# Patient Record
Sex: Male | Born: 1950
Health system: Southern US, Community
[De-identification: ages and names within clinical notes are randomized; demographics above are authoritative.]

## PROBLEM LIST (undated history)

## (undated) DIAGNOSIS — R0600 Dyspnea, unspecified: Secondary | ICD-10-CM

## (undated) DIAGNOSIS — I509 Heart failure, unspecified: Secondary | ICD-10-CM

## (undated) DIAGNOSIS — J449 Chronic obstructive pulmonary disease, unspecified: Secondary | ICD-10-CM

## (undated) DIAGNOSIS — E119 Type 2 diabetes mellitus without complications: Secondary | ICD-10-CM

## (undated) DIAGNOSIS — I10 Essential (primary) hypertension: Secondary | ICD-10-CM

## (undated) HISTORY — PX: NO PAST SURGERIES: SHX2092

---

## 2000-11-18 ENCOUNTER — Encounter: Payer: Self-pay | Admitting: Emergency Medicine

## 2000-11-18 ENCOUNTER — Emergency Department (HOSPITAL_COMMUNITY): Admission: EM | Admit: 2000-11-18 | Discharge: 2000-11-18 | Payer: Self-pay | Admitting: Emergency Medicine

## 2001-08-21 DIAGNOSIS — J449 Chronic obstructive pulmonary disease, unspecified: Secondary | ICD-10-CM | POA: Diagnosis present

## 2001-08-21 DIAGNOSIS — J441 Chronic obstructive pulmonary disease with (acute) exacerbation: Secondary | ICD-10-CM | POA: Diagnosis present

## 2001-08-24 ENCOUNTER — Encounter: Payer: Self-pay | Admitting: Emergency Medicine

## 2001-08-25 ENCOUNTER — Inpatient Hospital Stay (HOSPITAL_COMMUNITY): Admission: EM | Admit: 2001-08-25 | Discharge: 2001-09-01 | Payer: Self-pay | Admitting: Emergency Medicine

## 2001-10-27 DIAGNOSIS — B1710 Acute hepatitis C without hepatic coma: Secondary | ICD-10-CM | POA: Insufficient documentation

## 2007-10-16 ENCOUNTER — Emergency Department (HOSPITAL_COMMUNITY): Admission: EM | Admit: 2007-10-16 | Discharge: 2007-10-17 | Payer: Self-pay | Admitting: Emergency Medicine

## 2009-07-15 ENCOUNTER — Encounter: Admission: RE | Admit: 2009-07-15 | Discharge: 2009-07-15 | Payer: Self-pay | Admitting: Psychiatry

## 2009-07-16 ENCOUNTER — Inpatient Hospital Stay (HOSPITAL_COMMUNITY): Admission: EM | Admit: 2009-07-16 | Discharge: 2009-07-21 | Payer: Self-pay | Admitting: Emergency Medicine

## 2009-07-16 ENCOUNTER — Ambulatory Visit: Payer: Self-pay | Admitting: Cardiology

## 2009-07-16 ENCOUNTER — Encounter (INDEPENDENT_AMBULATORY_CARE_PROVIDER_SITE_OTHER): Payer: Self-pay | Admitting: Emergency Medicine

## 2009-07-23 ENCOUNTER — Emergency Department (HOSPITAL_COMMUNITY): Admission: EM | Admit: 2009-07-23 | Discharge: 2009-07-23 | Payer: Self-pay | Admitting: Emergency Medicine

## 2009-08-26 ENCOUNTER — Encounter: Admission: RE | Admit: 2009-08-26 | Discharge: 2009-08-26 | Payer: Self-pay | Admitting: Family Medicine

## 2009-11-20 ENCOUNTER — Ambulatory Visit: Payer: Self-pay | Admitting: Gastroenterology

## 2010-09-06 LAB — BLOOD GAS, ARTERIAL
Acid-Base Excess: 15.4 mmol/L — ABNORMAL HIGH (ref 0.0–2.0)
Bicarbonate: 40.6 mEq/L — ABNORMAL HIGH (ref 20.0–24.0)
Drawn by: 312971
O2 Content: 2 L/min
O2 Saturation: 86.3 %
Patient temperature: 97.6
TCO2: 42.5 mmol/L (ref 0–100)
pCO2 arterial: 60.4 mmHg (ref 35.0–45.0)
pH, Arterial: 7.44 (ref 7.350–7.450)
pO2, Arterial: 51.4 mmHg — ABNORMAL LOW (ref 80.0–100.0)

## 2010-09-06 LAB — POCT I-STAT 3, ART BLOOD GAS (G3+)
Acid-Base Excess: 8 mmol/L — ABNORMAL HIGH (ref 0.0–2.0)
Bicarbonate: 35.5 mEq/L — ABNORMAL HIGH (ref 20.0–24.0)
O2 Saturation: 85 %
Patient temperature: 98.6
TCO2: 37 mmol/L (ref 0–100)
pCO2 arterial: 62.9 mmHg (ref 35.0–45.0)
pH, Arterial: 7.36 (ref 7.350–7.450)
pO2, Arterial: 54 mmHg — ABNORMAL LOW (ref 80.0–100.0)

## 2010-09-06 LAB — DIFFERENTIAL
Basophils Absolute: 0 10*3/uL (ref 0.0–0.1)
Basophils Relative: 1 % (ref 0–1)
Eosinophils Absolute: 0.3 10*3/uL (ref 0.0–0.7)
Eosinophils Relative: 5 % (ref 0–5)
Lymphocytes Relative: 19 % (ref 12–46)
Lymphs Abs: 1.2 10*3/uL (ref 0.7–4.0)
Monocytes Absolute: 0.8 10*3/uL (ref 0.1–1.0)
Monocytes Relative: 13 % — ABNORMAL HIGH (ref 3–12)
Neutro Abs: 3.9 10*3/uL (ref 1.7–7.7)
Neutrophils Relative %: 63 % (ref 43–77)

## 2010-09-06 LAB — CBC
HCT: 38.3 % — ABNORMAL LOW (ref 39.0–52.0)
Hemoglobin: 12.9 g/dL — ABNORMAL LOW (ref 13.0–17.0)
MCHC: 33.7 g/dL (ref 30.0–36.0)
MCV: 114 fL — ABNORMAL HIGH (ref 78.0–100.0)
Platelets: 174 10*3/uL (ref 150–400)
RBC: 3.36 MIL/uL — ABNORMAL LOW (ref 4.22–5.81)
RDW: 14.1 % (ref 11.5–15.5)
WBC: 6.2 10*3/uL (ref 4.0–10.5)

## 2010-09-06 LAB — BASIC METABOLIC PANEL
BUN: 10 mg/dL (ref 6–23)
BUN: 10 mg/dL (ref 6–23)
BUN: 6 mg/dL (ref 6–23)
BUN: 9 mg/dL (ref 6–23)
CO2: 35 mEq/L — ABNORMAL HIGH (ref 19–32)
CO2: 38 mEq/L — ABNORMAL HIGH (ref 19–32)
CO2: 40 mEq/L — ABNORMAL HIGH (ref 19–32)
CO2: 42 mEq/L (ref 19–32)
Calcium: 8.8 mg/dL (ref 8.4–10.5)
Calcium: 9.1 mg/dL (ref 8.4–10.5)
Calcium: 9.2 mg/dL (ref 8.4–10.5)
Calcium: 9.3 mg/dL (ref 8.4–10.5)
Chloride: 104 mEq/L (ref 96–112)
Chloride: 89 mEq/L — ABNORMAL LOW (ref 96–112)
Chloride: 90 mEq/L — ABNORMAL LOW (ref 96–112)
Chloride: 96 mEq/L (ref 96–112)
Creatinine, Ser: 0.88 mg/dL (ref 0.4–1.5)
Creatinine, Ser: 0.89 mg/dL (ref 0.4–1.5)
Creatinine, Ser: 0.91 mg/dL (ref 0.4–1.5)
Creatinine, Ser: 0.95 mg/dL (ref 0.4–1.5)
GFR calc Af Amer: >60 mL/min (ref >60)
GFR calc Af Amer: >60 mL/min (ref >60)
GFR calc Af Amer: >60 mL/min (ref >60)
GFR calc Af Amer: >60 mL/min (ref >60)
GFR calc non Af Amer: >60 mL/min (ref >60)
GFR calc non Af Amer: >60 mL/min (ref >60)
GFR calc non Af Amer: >60 mL/min (ref >60)
GFR calc non Af Amer: >60 mL/min (ref >60)
Glucose, Bld: 115 mg/dL — ABNORMAL HIGH (ref 70–99)
Glucose, Bld: 150 mg/dL — ABNORMAL HIGH (ref 70–99)
Glucose, Bld: 180 mg/dL — ABNORMAL HIGH (ref 70–99)
Glucose, Bld: 98 mg/dL (ref 70–99)
Potassium: 3.3 mEq/L — ABNORMAL LOW (ref 3.5–5.1)
Potassium: 3.4 mEq/L — ABNORMAL LOW (ref 3.5–5.1)
Potassium: 3.8 mEq/L (ref 3.5–5.1)
Potassium: 4 mEq/L (ref 3.5–5.1)
Sodium: 136 mEq/L (ref 135–145)
Sodium: 139 mEq/L (ref 135–145)
Sodium: 142 mEq/L (ref 135–145)
Sodium: 143 mEq/L (ref 135–145)

## 2010-09-06 LAB — BRAIN NATRIURETIC PEPTIDE
Pro B Natriuretic peptide (BNP): 184 pg/mL — ABNORMAL HIGH (ref 0.0–100.0)
Pro B Natriuretic peptide (BNP): 400 pg/mL — ABNORMAL HIGH (ref 0.0–100.0)
Pro B Natriuretic peptide (BNP): 527 pg/mL — ABNORMAL HIGH (ref 0.0–100.0)

## 2010-09-06 LAB — POCT CARDIAC MARKERS
CKMB, poc: 1.1 ng/mL (ref 1.0–8.0)
Myoglobin, poc: 69.2 ng/mL (ref 12–200)
Troponin i, poc: <0.05 ng/mL (ref 0.00–0.09)

## 2010-09-06 LAB — CARDIAC PANEL(CRET KIN+CKTOT+MB+TROPI)
CK, MB: 1.7 ng/mL (ref 0.3–4.0)
CK, MB: 2 ng/mL (ref 0.3–4.0)
Relative Index: 1 (ref 0.0–2.5)
Relative Index: 1.1 (ref 0.0–2.5)
Total CK: 164 U/L (ref 7–232)
Total CK: 175 U/L (ref 7–232)
Troponin I: 0.03 ng/mL (ref 0.00–0.06)
Troponin I: 0.03 ng/mL (ref 0.00–0.06)

## 2010-09-06 LAB — FOLATE RBC: RBC Folate: 444 ng/mL (ref 180–600)

## 2010-09-06 LAB — MAGNESIUM: Magnesium: 1.5 mg/dL (ref 1.5–2.5)

## 2010-09-06 LAB — HEPATIC FUNCTION PANEL
ALT: 20 U/L (ref 0–53)
AST: 24 U/L (ref 0–37)
Albumin: 3.2 g/dL — ABNORMAL LOW (ref 3.5–5.2)
Alkaline Phosphatase: 64 U/L (ref 39–117)
Bilirubin, Direct: 0.2 mg/dL (ref 0.0–0.3)
Indirect Bilirubin: 0.3 mg/dL (ref 0.3–0.9)
Total Bilirubin: 0.5 mg/dL (ref 0.3–1.2)
Total Protein: 6.9 g/dL (ref 6.0–8.3)

## 2010-09-06 LAB — TSH: TSH: 0.527 u[IU]/mL (ref 0.350–4.500)

## 2010-09-06 LAB — VITAMIN B12: Vitamin B-12: 922 pg/mL — ABNORMAL HIGH (ref 211–911)

## 2010-11-06 NOTE — Discharge Summary (Signed)
[place]. [facility]  Patient:    Mitchell King, Mitchell King Visit Number: [id number] MRN: [id number]          Service Type: MED Location: [phone] Attending Physician:  Katy Apo. Dictated by:   Renford Dills, M.D. Admit Date:  08/24/2001 Discharge Date: 09/01/2001                             Discharge Summary  DATE OF BIRTH:  13-Jul-1950  DISCHARGE DIAGNOSES: 1. Alcohol withdrawal; condition at discharge stable status post detox with    Librium, p.r.n. Ativan. 2. Mild chronic obstructive pulmonary disease exacerbation status post    treatment with short course of oral antibiotics and p.o. steroids.  No    oxygen dependence at the time of discharge. 3. Hypoxia secondary to #2. 4. Chest pain relieved by nitroglycerin; normal coronary arteries per cardiac    catheterization.  The patients two-dimensional echocardiogram showed    ejection fraction of 45-50%, stress showed questionable reversible defect    in the apex however, as stated, normal coronary arteries via cardiac    catheterization.  No further cardiac evaluation needed. 5. Newly diagnosed diabetes; hemoglobin A1C of 7.3.  Discharged on p.o.    Amaryl.  The patient instructed on lifestyle modifications. 6. Tobacco abuse.  DISCHARGE MEDICATIONS: 1. Altace 5 mg one daily. 2. Amaryl 1 mg one half tab daily.  DISCHARGE INSTRUCTIONS:  The patient is asked to check his blood sugars two times a day and keep a log.  The patient is asked to purchase over-the-counter meds (multivitamin, thiamine, and folate), asked to take one aspirin daily 81 mg.  DISPOSITION:  The patient is being discharged to home, will follow up at [facility] where the patient plans to enter alcohol detoxication.  The patient plans to find a primary MD there while in detox.  STUDIES:  As stated, the patient had a cardiac catheterization which revealed normal coronary arteries.  Two-dimensional echo - EF 45-50%, mild  decreased LV function, wall motion cannot be evaluated.  The patient had a Cardiolite stress test which showed reversible defect in the apex.  The patient had a hemoglobin A1C elevated at 7.23, a TSH normal at 0.40.  The patient had a spiral CAT scan of the chest negative for PE but it did show some small pleural densities with recommended treatment followup.  HISTORY OF PRESENT ILLNESS:  The patient is a 60 year old black male history of the above medical problems brought to the ED by family because of complaints of agitation and being abuser.  Further eval revealed that the patient had been intoxicated and was suffering from alcohol withdrawal. Further questioning also revealed that the patient had complaints of shortness of breath and cough (nonproductive).  The patient was found to be hypoxic on room air at 80%.  Admission was deemed necessary for further evaluation and treatment of alcohol intoxication and apparent COPD exacerbation.  Please see dictated H&P for further details of history of present illness.  HOSPITAL COURSE: #1 - ALCOHOL WITHDRAWAL:  The patient was admitted to medicine floor bed, telemetry, was treated for alcohol withdrawal with multivitamin, thiamine, folate, Librium and Ativan as needed.  There were no complications from the patients treatment from the standpoint of alcohol withdrawal.  The patient recovered without any complications.  #2 - CHRONIC OBSTRUCTIVE PULMONARY DISEASE EXACERBATION:  The patient is a known smoker and as stated had complaints of cough (  nonproductive in nature) and shortness of breath and found to be hypoxic on admission.  The patients chest x-ray did show an infiltrate; however, the patient started with general treatment of O2, nebs, steroids, and antibiotics for COPD exacerbation.  The patients O2 requirement improved with the above treatment and there were no other complications.  The patient did not require O2 at the time of  discharge. The patient states that he is now ready to stop smoking and it has been recommended to try nicotine patch.  He also will be provided with an inhaler of Combivent that he has to take every 4-6 hours as needed for cough and shortness of breath.  #3 - CHEST PAIN RELIEVED BY NITROGLYCERIN:  On August 26, 2001, the patient had complaints of shortness of breath and chest pain which had been relieved by nitroglycerin.  Because of the above the patient underwent cardiac evaluation studies as described above included 2-D echo, Cardiolite stress test, and ultimately a cardiac catheterization which revealed normal coronary arteries. The patient did not have any other episodes of chest discomfort during this hospitalization and no further cardiac evaluation is deemed necessary.  #4 - NEWLY DIAGNOSED DIABETES:  The patient had fasting CBGs that were elevated during this hospitalization.  Further testing revealed a CBG of 7.3. The patient is started on Amaryl inhospital, had some high fluctuations in his CBGs but this is partially to be secondary to the patients prednisone taper because of the COPD.  The patient is going to be started on a low dose of Amaryl 1 mg one half tablet daily and asked to have lifestyle modifications while exercising four times a week and no concentrated sweets and start on an 1800 ADA diet.  There were no other complications during this hospitalization. As stated, the patient is advised to refrain from alcohol use and tobacco use. The patient presently plans to seek alcohol rehab at [facility]. Dictated by:   Renford Dills, M.D. Attending Physician:  Renford Dills D. DD:  09/01/01 TD:  09/02/01 Job: [id number]

## 2010-11-06 NOTE — Cardiovascular Report (Signed)
[place]. [name]  Patient:    Mitchell King, Mitchell King Visit Number: [id number] MRN: [id number]          Service Type: MED Location: [phone] Attending Physician:  Katy Apo. Dictated by:   Darci Needle, M.D. Proc. Date: 08/31/01 Admit Date:  08/24/2001 Discharge Date: 09/01/2001   CC:         Renford Dills, M.D.  Anastasio Auerbach, M.D.  Armanda Magic, M.D.   Cardiac Catheterization  INDICATIONS:  Abnormal Cardiolite suggesting inferior infarction/ischemia.  PROCEDURE PERFORMED: 1. Left heart catheterization. 2. Selective coronary angiography. 3. Left ventriculography.  DESCRIPTION OF PROCEDURE:  After informed consent, a #6 French sheath was placed in the right femoral artery using the modified Seldinger technique.  A #6 French A2 multipurpose catheter was used for hemodynamic recordings, left ventriculography, and right coronary angiography.  The patient tolerated the procedure without complications.  RESULTS:   I. Hemodynamic data      A. Aortic pressure 148/90.      B. Left ventricular pressure 148/18.  II. Left ventriculography:  The ventricle is dilated.  The contractile      pattern is mild diffuse hypokinesis.  The EF is in the 40-50% range.      No mitral regurgitation is noted. III. Selective coronary angiography      A. Left main coronary:  Widely patent.      B. Left anterior descending coronary:  The left anterior descending         coronary artery is large, wraps around the left ventricular apex, and         gives origin to 2 diagonal branches.  The most significant disease is         noted in the LAD.      C. Circumflex artery:  The circumflex is large.  It gives origin to 2         obtuse marginals.  It is entirely normal.      D. Right coronary:  Normal.  This is a large vessel that gives origin to         a PDA and 2 left ventricular branches.  No significant obstruction is         noted.  The PDA and 2 left ventricular  branches arise distally.  CONCLUSIONS: 1. 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 2. Essentially normal epicardial coronary arteries without any evidence of    atherosclerosis angiographically.  RECOMMENDATIONS:  Absence from alcohol.  Aggressive blood pressure control including beta blocker and ACE inhibitor therapy as components of the antihypertensive regimen to help prevent progressive left ventricular dysfunction over time. Dictated by:   Darci Needle, M.D. Attending Physician:  Renford Dills D. DD:  08/31/01 TD:  09/01/01 Job: [id number]

## 2011-03-16 LAB — POCT I-STAT, CHEM 8
BUN: 14
Calcium, Ion: 1.11 — ABNORMAL LOW
Chloride: 105
Creatinine, Ser: 1.1
Glucose, Bld: 99
HCT: 51
Hemoglobin: 17.3 — ABNORMAL HIGH
Potassium: 4.1
Sodium: 142
TCO2: 26

## 2011-03-16 LAB — POCT CARDIAC MARKERS
CKMB, poc: 1.8
Myoglobin, poc: 148
Operator id: 277751
Troponin i, poc: <0.05

## 2011-11-20 ENCOUNTER — Emergency Department (HOSPITAL_COMMUNITY)
Admission: EM | Admit: 2011-11-20 | Discharge: 2011-11-20 | Disposition: A | Discharge status: Home / Self Care | Payer: BC Managed Care – PPO | Source: Home / Self Care | Attending: Emergency Medicine | Admitting: Emergency Medicine

## 2011-11-20 ENCOUNTER — Encounter (HOSPITAL_COMMUNITY): Payer: Self-pay | Admitting: *Deleted

## 2011-11-20 DIAGNOSIS — K5289 Other specified noninfective gastroenteritis and colitis: Secondary | ICD-10-CM

## 2011-11-20 DIAGNOSIS — K529 Noninfective gastroenteritis and colitis, unspecified: Secondary | ICD-10-CM

## 2011-11-20 HISTORY — DX: Chronic obstructive pulmonary disease, unspecified: J44.9

## 2011-11-20 HISTORY — DX: Essential (primary) hypertension: I10

## 2011-11-20 MED ORDER — GI COCKTAIL ~~LOC~~
30.0000 mL | Freq: Once | ORAL | Status: AC
  Administered 2011-11-20: 30 mL via ORAL

## 2011-11-20 MED ORDER — FLUTICASONE-SALMETEROL 250-50 MCG/DOSE IN AEPB: 1.0000 | INHALATION_SPRAY | Freq: Two times a day (BID) | RESPIRATORY_TRACT | Status: DC

## 2011-11-20 MED ORDER — DIPHENOXYLATE-ATROPINE 2.5-0.025 MG PO TABS: 1.0000 | ORAL_TABLET | Freq: Four times a day (QID) | ORAL | Status: AC | PRN

## 2011-11-20 MED ORDER — ONDANSETRON 4 MG PO TBDP
ORAL_TABLET | ORAL | Status: AC
  Filled 2011-11-20: qty 2

## 2011-11-20 MED ORDER — ONDANSETRON 8 MG PO TBDP: 8.0000 mg | ORAL_TABLET | Freq: Three times a day (TID) | ORAL | Status: AC | PRN

## 2011-11-20 MED ORDER — ONDANSETRON 4 MG PO TBDP
8.0000 mg | ORAL_TABLET | Freq: Once | ORAL | Status: AC
  Administered 2011-11-20: 8 mg via ORAL

## 2011-11-20 MED ORDER — GI COCKTAIL ~~LOC~~
ORAL | Status: AC
  Filled 2011-11-20: qty 30

## 2011-11-20 NOTE — ED Notes (Signed)
Pt with onset of n/v/d abdominal pain early Thursday am - upper abdominal pain worse with coughing - vomited x 2 today - diarrhea x 1 today - per pt drank some water this am vomited - yesterday vomiting and diarrhea when attempted to drink water - feeling weak and dizzy this am

## 2011-11-20 NOTE — ED Notes (Signed)
Nausea improved with zofran odt - gi cocktail given

## 2011-11-20 NOTE — ED Provider Notes (Signed)
Chief Complaint  Patient presents with  . Nausea  . Emesis  . Diarrhea  . Abdominal Pain  . Cough    History of Present Illness:   The patient is a 61 year old male who has had a three-day history of nausea, vomiting, and diarrhea he's had pain in his upper abdomen. He denies any fever, chills, but has had some sweats. There's been no blood in the vomitus or stool and no bilious vomitus. He's had no suspicious ingestions or exposures.  Review of Systems:  Other than noted above, the patient denies any of the following symptoms: Systemic:  No fevers, chills, sweats, weight loss or gain, fatigue, or tiredness. ENT:  No nasal congestion, rhinorrhea, or sore throat. Lungs:  No cough, wheezing, or shortness of breath. Cardiac:  No chest pain, syncope, or presyncope. GI:  No abdominal pain, nausea, vomiting, anorexia, diarrhea, constipation, blood in stool or vomitus. GU:  No dysuria, frequency, or urgency.  PMFSH:  Past medical history, family history, social history, meds, and allergies were reviewed.  Physical Exam:   Vital signs:  BP 136/81  Pulse 115  Temp(Src) 98.9 F (37.2 C) (Oral)  Resp 21  SpO2 93% General:  Alert and oriented.  In no distress.  Skin warm and dry.  Good skin turgor, brisk capillary refill. ENT:  No scleral icterus, moist mucous membranes, no oral lesions, pharynx clear. Lungs:  Breath sounds clear and equal bilaterally.  No wheezes, rales, or rhonchi. Heart:  Rhythm regular, without extrasystoles.  No gallops or murmers. Abdomen:  Abdomen was soft, flat, nondistended. He had mild generalized pain to palpation without guarding or rebound. This was not localized with the exception of some localized tenderness in the epigastrium. There was no guarding or rebound. No organomegaly or mass. Bowel sounds are hyperactive. Skin: Clear, warm, and dry.  Good turgor.  Brisk capillary refill.   Course in Urgent Care Center:   He was given a dose of GI cocktail, 30 mL, and  Zofran 8 mg sublingually. He tolerated these well without any side effects.  Assessment:  The encounter diagnosis was Gastroenteritis.  Plan:   1.  The following meds were prescribed:   New Prescriptions   DIPHENOXYLATE-ATROPINE (LOMOTIL) 2.5-0.025 MG PER TABLET    Take 1 tablet by mouth 4 (four) times daily as needed for diarrhea or loose stools.   FLUTICASONE-SALMETEROL (ADVAIR DISKUS) 250-50 MCG/DOSE AEPB    Inhale 1 puff into the lungs 2 (two) times daily.   ONDANSETRON (ZOFRAN ODT) 8 MG DISINTEGRATING TABLET    Take 1 tablet (8 mg total) by mouth every 8 (eight) hours as needed for nausea.   2.  The patient was instructed in symptomatic care and handouts were given. 3.  The patient was told to return if becoming worse in any way, if no better in 2 or 3 days, and given some red flag symptoms that would indicate earlier return. 4.  The patient was told to take only sips of clear liquids for the next 24 hours and then advance to a b.r.a.t. Diet.      Reuben Likes, MD 11/20/11 2232

## 2011-11-20 NOTE — Discharge Instructions (Signed)

## 2013-10-11 ENCOUNTER — Encounter (HOSPITAL_COMMUNITY): Payer: Self-pay | Admitting: Emergency Medicine

## 2013-10-11 ENCOUNTER — Emergency Department (INDEPENDENT_AMBULATORY_CARE_PROVIDER_SITE_OTHER): Payer: BC Managed Care – PPO

## 2013-10-11 ENCOUNTER — Emergency Department (HOSPITAL_COMMUNITY)
Admission: EM | Admit: 2013-10-11 | Discharge: 2013-10-11 | Disposition: A | Discharge status: Home / Self Care | Payer: BC Managed Care – PPO | Source: Home / Self Care | Attending: Family Medicine | Admitting: Family Medicine

## 2013-10-11 DIAGNOSIS — J441 Chronic obstructive pulmonary disease with (acute) exacerbation: Secondary | ICD-10-CM

## 2013-10-11 LAB — POCT I-STAT, CHEM 8
BUN: 17 mg/dL (ref 6–23)
Calcium, Ion: 1.08 mmol/L — ABNORMAL LOW (ref 1.13–1.30)
Chloride: 99 mEq/L (ref 96–112)
Creatinine, Ser: 1 mg/dL (ref 0.50–1.35)
Glucose, Bld: 106 mg/dL — ABNORMAL HIGH (ref 70–99)
HCT: 50 % (ref 39.0–52.0)
Hemoglobin: 17 g/dL (ref 13.0–17.0)
Potassium: 3.8 mEq/L (ref 3.7–5.3)
Sodium: 137 mEq/L (ref 137–147)
TCO2: 27 mmol/L (ref 0–100)

## 2013-10-11 LAB — CBC
HCT: 45.1 % (ref 39.0–52.0)
Hemoglobin: 15.2 g/dL (ref 13.0–17.0)
MCH: 31.3 pg (ref 26.0–34.0)
MCHC: 33.7 g/dL (ref 30.0–36.0)
MCV: 93 fL (ref 78.0–100.0)
Platelets: 256 10*3/uL (ref 150–400)
RBC: 4.85 MIL/uL (ref 4.22–5.81)
RDW: 13.3 % (ref 11.5–15.5)
WBC: 5.5 10*3/uL (ref 4.0–10.5)

## 2013-10-11 MED ORDER — ALBUTEROL SULFATE HFA 108 (90 BASE) MCG/ACT IN AERS: 2.0000 | INHALATION_SPRAY | Freq: Four times a day (QID) | RESPIRATORY_TRACT | Status: DC | PRN

## 2013-10-11 MED ORDER — PREDNISONE 10 MG PO KIT: PACK | ORAL | Status: DC

## 2013-10-11 MED ORDER — IPRATROPIUM-ALBUTEROL 0.5-2.5 (3) MG/3ML IN SOLN
RESPIRATORY_TRACT | Status: AC
Start: 2013-10-11 — End: 2013-10-11
  Filled 2013-10-11: qty 3

## 2013-10-11 MED ORDER — AZITHROMYCIN 250 MG PO TABS: 250.0000 mg | ORAL_TABLET | Freq: Every day | ORAL | Status: DC

## 2013-10-11 MED ORDER — IPRATROPIUM-ALBUTEROL 0.5-2.5 (3) MG/3ML IN SOLN
3.0000 mL | Freq: Once | RESPIRATORY_TRACT | Status: AC
  Administered 2013-10-11: 3 mL via RESPIRATORY_TRACT

## 2013-10-11 NOTE — Discharge Instructions (Signed)
Thank you for coming in today. Take prednisone daily as directed.  Take azithromycin antibiotics.  Use albuterol as needed Please quit smoking. You will die if you do not stop smoking.  Follow up with your primary doctor soon.  Call or go to the emergency room if you get worse, have trouble breathing, have chest pains, or palpitations.   Chronic Obstructive Pulmonary Disease Exacerbation Chronic obstructive pulmonary disease (COPD) is a common lung condition in which airflow from the lungs is limited. COPD is a general term that can be used to describe many different lung problems that limit airflow, including chronic bronchitis and emphysema. COPD exacerbations are episodes when breathing symptoms become much worse and require extra treatment. Without treatment, COPD exacerbations can be life threatening, and frequent COPD exacerbations can cause further damage to your lungs. CAUSES   Respiratory infections.   Exposure to smoke.   Exposure to air pollution, chemical fumes, or dust. Sometimes there is no apparent cause or trigger. RISK FACTORS  Smoking cigarettes.  Older age.  Frequent prior COPD exacerbations. SIGNS AND SYMPTOMS   Increased coughing.   Increased thick spit (sputum) production.   Increased wheezing.   Increased shortness of breath.   Rapid breathing.   Chest tightness. DIAGNOSIS  Your medical history, a physical exam, and tests will help your health care provider make a diagnosis. Tests may include:  A chest X-ray.  Basic lab tests.  Sputum testing.  An arterial blood gas test. TREATMENT  Depending on the severity of your COPD exacerbation, you may need to be admitted to a hospital for treatment. Some of the treatments commonly used to treat COPD exacerbations are:   Antibiotic medicines.   Bronchodilators. These are drugs that expand the air passages. They may be given with an inhaler or nebulizer. Spacer devices may be needed to help  improve drug delivery.  Corticosteroid medicines.  Supplemental oxygen therapy.  HOME CARE INSTRUCTIONS   Do not smoke. Quitting smoking is very important to prevent COPD from getting worse and exacerbations from happening as often.  Avoid exposure to all substances that irritate the airway, especially to tobacco smoke.   If prescribed, take your antibiotics as directed. Finish them even if you start to feel better.  Only take over-the-counter or prescription medicines as directed by your health care provider.It is important to use correct technique with inhaled medicines.  Drink enough fluids to keep your urine clear or pale yellow (unless you have a medical condition that requires fluid restriction).  Use a cool mist vaporizer. This makes it easier to clear your chest when you cough.   If you have a home nebulizer and oxygen, continue to use them as directed.   Maintain all necessary vaccinations to prevent infections.   Exercise regularly.   Eat a healthy diet.   Keep all follow-up appointments as directed by your health care provider. SEEK IMMEDIATE MEDICAL CARE IF:  You have worsening shortness of breath.   You have trouble talking.   You have severe chest pain.  You have blood in your sputum.  You have a fever.  You have weakness, vomit repeatedly, or faint.   You feel confused.   You continue to get worse. MAKE SURE YOU:   Understand these instructions.  Will watch your condition.  Will get help right away if you are not doing well or get worse. Document Released: 04/04/2007 Document Revised: 03/28/2013 Document Reviewed: 02/09/2013 [facility] SurgeryExit[facility] Patient Information 2014 [place], [place].

## 2013-10-11 NOTE — ED Notes (Signed)
Pt c/o SOB onset Monday Reports nurse/medical provider at job Rx Ventolin and BP med 02 stat today is 90% Sx also include: productive cough, HA, dizziness, vomiting, decreased appetite Hx of COPD; smokes 2 PPD Denies f/d Alert w/no signs of acute distress.

## 2013-10-11 NOTE — ED Provider Notes (Signed)
Mitchell King is a 63 y.o. male who presents to Urgent Care today for shortness of breath. Patient has shortness of breath productive cough congestion and wheezing. Symptoms have been present for the past 5 days or so. He was seen by a nurse practitioner at work early this week and was prescribed albuterol. This helps temporarily. For shortness of breath is worse with exertion. He denies any significant chest pains or palpitations. He has a history of COPD but does not take any chronic medications. He is currently smoking about 2 packs a day.   Past Medical History  Diagnosis Date  . Hypertension   . COPD (chronic obstructive pulmonary disease)    History  Substance Use Topics  . Smoking status: Current Every Day Smoker  . Smokeless tobacco: Not on file  . Alcohol Use: No     Comment: non x 5 years    ROS as above Medications: No current facility-administered medications for this encounter.   Current Outpatient Prescriptions  Medication Sig Dispense Refill  . albuterol (PROVENTIL HFA;VENTOLIN HFA) 108 (90 BASE) MCG/ACT inhaler Inhale 2 puffs into the lungs every 6 (six) hours as needed for wheezing or shortness of breath.  1 Inhaler  2  . azithromycin (ZITHROMAX) 250 MG tablet Take 1 tablet (250 mg total) by mouth daily. Take first 2 tablets together, then 1 every day until finished.  6 tablet  0  . PredniSONE 10 MG KIT 12 day dose pack po  1 kit  0  . [DISCONTINUED] Fluticasone-Salmeterol (ADVAIR DISKUS) 250-50 MCG/DOSE AEPB Inhale 1 puff into the lungs 2 (two) times daily.  60 each  0    Exam:  BP 115/82  Pulse 95  Temp(Src) 98.3 F (36.8 C) (Oral)  Resp 27  SpO2 90% Oxygen saturation ranges from 90-93% on room air at rest Gen: Well NAD HEENT: EOMI,  MMM Lungs: Mildly increased worker breathing with some tachypnea present. Wheezing and expiratory phase is present bilaterally. Heart: RRR no MRG Abd: NABS, Soft. NT, ND Exts: Brisk capillary refill, warm and well perfused.    Patient was given a DuoNeb nebulizer treatment, and felt much better. His lung exam improved.  Results for orders placed during the hospital encounter of 10/11/13 (from the past 24 hour(s))  CBC     Status: None   Collection Time    10/11/13  3:41 PM      Result Value Ref Range   WBC 5.5  4.0 - 10.5 K/uL   RBC 4.85  4.22 - 5.81 MIL/uL   Hemoglobin 15.2  13.0 - 17.0 g/dL   HCT 45.1  39.0 - 52.0 %   MCV 93.0  78.0 - 100.0 fL   MCH 31.3  26.0 - 34.0 pg   MCHC 33.7  30.0 - 36.0 g/dL   RDW 13.3  11.5 - 15.5 %   Platelets 256  150 - 400 K/uL  POCT I-STAT, CHEM 8     Status: Abnormal   Collection Time    10/11/13  4:04 PM      Result Value Ref Range   Sodium 137  137 - 147 mEq/L   Potassium 3.8  3.7 - 5.3 mEq/L   Chloride 99  96 - 112 mEq/L   BUN 17  6 - 23 mg/dL   Creatinine, Ser 1.00  0.50 - 1.35 mg/dL   Glucose, Bld 106 (*) 70 - 99 mg/dL   Calcium, Ion 1.08 (*) 1.13 - 1.30 mmol/L   TCO2 27  0 -  100 mmol/L   Hemoglobin 17.0  13.0 - 17.0 g/dL   HCT 50.0  39.0 - 52.0 %   Dg Chest 2 View  10/11/2013   CLINICAL DATA:  Chest pain.  EXAM: CHEST  2 VIEW  COMPARISON:  DG CHEST 2 VIEW dated 07/23/2009  FINDINGS: Mediastinum and hilar structures normal. The lungs are clear. Basilar pleural parenchymal thickening again noted consistent with scarring. Heart size normal. No acute bony abnormality. Degenerative changes thoracic spine.  IMPRESSION: Pleural parenchymal scarring.  No acute abnormality.   Electronically Signed   By: Marcello Moores  Register   On: 10/11/2013 16:18    Assessment and Plan: 63 y.o. male with COPD exacerbation. Plan to treat with prednisone, albuterol, and antibiotics. Encourage patient to quit smoking. Followup with primary care provider soon.  Discussed warning signs or symptoms. Please see discharge instructions. Patient expresses understanding.    Gregor Hams, MD 10/11/13 [phone]

## 2017-04-20 ENCOUNTER — Encounter (INDEPENDENT_AMBULATORY_CARE_PROVIDER_SITE_OTHER): Payer: Self-pay | Admitting: Physician Assistant

## 2017-04-20 ENCOUNTER — Encounter (HOSPITAL_COMMUNITY): Payer: Self-pay | Admitting: *Deleted

## 2017-04-20 ENCOUNTER — Ambulatory Visit (INDEPENDENT_AMBULATORY_CARE_PROVIDER_SITE_OTHER): Payer: Medicare Other | Admitting: Physician Assistant

## 2017-04-20 ENCOUNTER — Emergency Department (HOSPITAL_COMMUNITY)
Admission: EM | Admit: 2017-04-20 | Discharge: 2017-04-20 | Disposition: A | Discharge status: Home / Self Care | Payer: Medicare Other | Attending: Emergency Medicine | Admitting: Emergency Medicine

## 2017-04-20 ENCOUNTER — Emergency Department (HOSPITAL_COMMUNITY): Payer: Medicare Other

## 2017-04-20 VITALS — BP 159/80 | HR 83 | Temp 97.9°F | Wt 171.6 lb

## 2017-04-20 DIAGNOSIS — R42 Dizziness and giddiness: Secondary | ICD-10-CM | POA: Insufficient documentation

## 2017-04-20 DIAGNOSIS — I10 Essential (primary) hypertension: Secondary | ICD-10-CM | POA: Diagnosis not present

## 2017-04-20 DIAGNOSIS — F1021 Alcohol dependence, in remission: Secondary | ICD-10-CM

## 2017-04-20 DIAGNOSIS — E119 Type 2 diabetes mellitus without complications: Secondary | ICD-10-CM | POA: Diagnosis not present

## 2017-04-20 DIAGNOSIS — J449 Chronic obstructive pulmonary disease, unspecified: Secondary | ICD-10-CM | POA: Insufficient documentation

## 2017-04-20 DIAGNOSIS — R7303 Prediabetes: Secondary | ICD-10-CM

## 2017-04-20 DIAGNOSIS — I951 Orthostatic hypotension: Secondary | ICD-10-CM | POA: Diagnosis not present

## 2017-04-20 DIAGNOSIS — I499 Cardiac arrhythmia, unspecified: Secondary | ICD-10-CM | POA: Diagnosis not present

## 2017-04-20 DIAGNOSIS — R739 Hyperglycemia, unspecified: Secondary | ICD-10-CM

## 2017-04-20 DIAGNOSIS — R9431 Abnormal electrocardiogram [ECG] [EKG]: Secondary | ICD-10-CM | POA: Diagnosis not present

## 2017-04-20 DIAGNOSIS — F17200 Nicotine dependence, unspecified, uncomplicated: Secondary | ICD-10-CM | POA: Diagnosis not present

## 2017-04-20 HISTORY — DX: Type 2 diabetes mellitus without complications: E11.9

## 2017-04-20 LAB — COMPREHENSIVE METABOLIC PANEL
ALT: 25 U/L (ref 17–63)
AST: 25 U/L (ref 15–41)
Albumin: 4 g/dL (ref 3.5–5.0)
Alkaline Phosphatase: 58 U/L (ref 38–126)
Anion gap: 10 (ref 5–15)
BUN: 14 mg/dL (ref 6–20)
CO2: 25 mmol/L (ref 22–32)
Calcium: 9.4 mg/dL (ref 8.9–10.3)
Chloride: 101 mmol/L (ref 101–111)
Creatinine, Ser: 0.98 mg/dL (ref 0.61–1.24)
GFR calc Af Amer: >60 mL/min (ref >60)
GFR calc non Af Amer: >60 mL/min (ref >60)
Glucose, Bld: 85 mg/dL (ref 65–99)
Potassium: 3.8 mmol/L (ref 3.5–5.1)
Sodium: 136 mmol/L (ref 135–145)
Total Bilirubin: 1 mg/dL (ref 0.3–1.2)
Total Protein: 8.5 g/dL — ABNORMAL HIGH (ref 6.5–8.1)

## 2017-04-20 LAB — DIFFERENTIAL
Basophils Absolute: 0 10*3/uL (ref 0.0–0.1)
Basophils Relative: 0 %
Eosinophils Absolute: 0.1 10*3/uL (ref 0.0–0.7)
Eosinophils Relative: 1 %
Lymphocytes Relative: 32 %
Lymphs Abs: 1.8 10*3/uL (ref 0.7–4.0)
Monocytes Absolute: 0.5 10*3/uL (ref 0.1–1.0)
Monocytes Relative: 9 %
Neutro Abs: 3.2 10*3/uL (ref 1.7–7.7)
Neutrophils Relative %: 58 %

## 2017-04-20 LAB — CBC
HCT: 43.6 % (ref 39.0–52.0)
Hemoglobin: 14.5 g/dL (ref 13.0–17.0)
MCH: 34.2 pg — ABNORMAL HIGH (ref 26.0–34.0)
MCHC: 33.3 g/dL (ref 30.0–36.0)
MCV: 102.8 fL — ABNORMAL HIGH (ref 78.0–100.0)
Platelets: 248 10*3/uL (ref 150–400)
RBC: 4.24 MIL/uL (ref 4.22–5.81)
RDW: 12.4 % (ref 11.5–15.5)
WBC: 5.5 10*3/uL (ref 4.0–10.5)

## 2017-04-20 LAB — PROTIME-INR
INR: 0.98
Prothrombin Time: 12.9 seconds (ref 11.4–15.2)

## 2017-04-20 LAB — I-STAT CHEM 8, ED
BUN: 17 mg/dL (ref 6–20)
Calcium, Ion: 1.2 mmol/L (ref 1.15–1.40)
Chloride: 100 mmol/L — ABNORMAL LOW (ref 101–111)
Creatinine, Ser: 1 mg/dL (ref 0.61–1.24)
Glucose, Bld: 90 mg/dL (ref 65–99)
HCT: 48 % (ref 39.0–52.0)
Hemoglobin: 16.3 g/dL (ref 13.0–17.0)
Potassium: 4.1 mmol/L (ref 3.5–5.1)
Sodium: 139 mmol/L (ref 135–145)
TCO2: 27 mmol/L (ref 22–32)

## 2017-04-20 LAB — POCT URINALYSIS DIPSTICK
Bilirubin, UA: NEGATIVE
Blood, UA: NEGATIVE
Glucose, UA: NEGATIVE
Ketones, UA: NEGATIVE
Leukocytes, UA: NEGATIVE
Nitrite, UA: NEGATIVE
Protein, UA: 100
Spec Grav, UA: 1.02 (ref 1.010–1.025)
Urobilinogen, UA: 1 E.U./dL
pH, UA: 5 (ref 5.0–8.0)

## 2017-04-20 LAB — TROPONIN I: Troponin I: <0.03 ng/mL (ref <0.03)

## 2017-04-20 LAB — APTT: aPTT: 41 seconds — ABNORMAL HIGH (ref 24–36)

## 2017-04-20 LAB — POCT GLYCOSYLATED HEMOGLOBIN (HGB A1C): Hemoglobin A1C: 5.7

## 2017-04-20 MED ORDER — ALBUTEROL SULFATE HFA 108 (90 BASE) MCG/ACT IN AERS
2.0000 | INHALATION_SPRAY | RESPIRATORY_TRACT | Status: DC | PRN
  Filled 2017-04-20: qty 6.7

## 2017-04-20 NOTE — ED Notes (Signed)
Steady gait at present equal grips bi-laterally  No speech difficulty

## 2017-04-20 NOTE — ED Triage Notes (Signed)
Dizziness for the past 2 days with some blurred vision.  No headache he was sent here by his doctor   Blurred vision with his dizziness.  Diabetic and hypertension but he does not take any medicine for for either.    No blood  thinners

## 2017-04-20 NOTE — ED Provider Notes (Signed)
[facility] EMERGENCY DEPARTMENT Provider Note   CSN: [id number] Arrival date & time: 04/20/17  1535     History   Chief Complaint Chief Complaint  Patient presents with  . Dizziness    HPI Loring Liskey is a 66 y.o. male.  HPI  66 year old man history of diabetes, hypertension, COPD who has not been taking any medication for many years presents tonight feeling lightheaded when he stands up.  He was seen by his primary care doctor today.  He was told to come to the emergency department so that he could have labs drawn as the doctor was unable to obtain these in his office.  Patient denies any headache, head injury, chest pain, dyspnea, abdominal pain, or weight change.  He states that he has had some blurring of his vision when he stands up to get light gets lightheaded.  He has not had any passing out, near syncope, or chest pain.  Past Medical History:  Diagnosis Date  . COPD (chronic obstructive pulmonary disease) (HCC)   . Diabetes mellitus without complication (HCC)   . Hypertension     There are no active problems to display for this patient.   History reviewed. No pertinent surgical history.     Home Medications    Prior to Admission medications   Not on File    Family History No family history on file.  Social History Social History  Substance Use Topics  . Smoking status: Current Every Day Smoker  . Smokeless tobacco: Never Used  . Alcohol use No     Comment: non x 5 years      Allergies   Patient has no known allergies.   Review of Systems Review of Systems  All other systems reviewed and are negative.    Physical Exam Updated Vital Signs BP (!) 160/81   Pulse 63   Temp 98.3 F (36.8 C) (Oral)   Resp 18   Ht 1.854 m (6\' 1" )   Wt 81.3 kg (179 lb 5 oz)   SpO2 95%   BMI 23.66 kg/m   Physical Exam  Constitutional: He appears well-developed and well-nourished.  HENT:  Head: Normocephalic and atraumatic.  Right  Ear: External ear normal.  Left Ear: External ear normal.  Eyes: Pupils are equal, round, and reactive to light. EOM are normal.  Neck: Normal range of motion. Neck supple.  Cardiovascular: Normal rate, regular rhythm, normal heart sounds and intact distal pulses.   Pulmonary/Chest: Effort normal and breath sounds normal.  Nursing note and vitals reviewed.    ED Treatments / Results  Labs (all labs ordered are listed, but only abnormal results are displayed) Labs Reviewed  APTT - Abnormal; Notable for the following:       Result Value   aPTT 41 (*)    All other components within normal limits  CBC - Abnormal; Notable for the following:    MCV 102.8 (*)    MCH 34.2 (*)    All other components within normal limits  COMPREHENSIVE METABOLIC PANEL - Abnormal; Notable for the following:    Total Protein 8.5 (*)    All other components within normal limits  I-STAT CHEM 8, ED - Abnormal; Notable for the following:    Chloride 100 (*)    All other components within normal limits  PROTIME-INR  DIFFERENTIAL  TROPONIN I  CBG MONITORING, ED    EKG  EKG Interpretation  Date/Time:  Wednesday April 20 2017 15:50:14 EDT Ventricular Rate:  82 PR Interval:  176 QRS Duration: 94 QT Interval:  390 QTC Calculation: 455 R Axis:   -97 Text Interpretation:  Normal sinus rhythm Right superior axis deviation Incomplete right bundle branch block Right ventricular hypertrophy Cannot rule out Anterior infarct , age undetermined Abnormal ECG Confirmed by Margarita Grizzleay, Issaiah Seabrooks [phone]([id number]) on 04/20/2017 10:22:20 PM       Radiology Ct Head Wo Contrast  Result Date: 04/20/2017 CLINICAL DATA:  Dizziness for 2 days EXAM: CT HEAD WITHOUT CONTRAST TECHNIQUE: Contiguous axial images were obtained from the base of the skull through the vertex without intravenous contrast. COMPARISON:  None. FINDINGS: Brain: No evidence of acute infarction, hemorrhage, hydrocephalus, extra-axial collection or mass lesion/mass  effect. Vascular: No hyperdense vessel or unexpected calcification. Skull: Normal. Negative for fracture or focal lesion. Sinuses/Orbits: No acute finding. Other: None. IMPRESSION: No acute intracranial abnormality noted. Electronically Signed   By: Alcide CleverMark  Lukens M.D.   On: 04/20/2017 16:53    Procedures Procedures (including critical care time)  Medications Ordered in ED Medications - No data to display   Initial Impression / Assessment and Plan / ED Course  I have reviewed the triage vital signs and the nursing notes.  Pertinent labs & imaging results that were available during my care of the patient were reviewed by me and considered in my medical decision making (see chart for details).     66 year old man with some lightheadedness presents here today for evaluation.  EKG reveals no acute ischemic changes and is unchanged from prior.  Troponin is normal.  Hemoglobin is normal and unchanged from prior.  Blood sugar is normal here at 85.  Patient is well-appearing here.  We have discussed return precautions and need for follow-up and he voices understanding.  Final Clinical Impressions(s) / ED Diagnoses   Final diagnoses:  Lightheadedness    New Prescriptions New Prescriptions   No medications on file     Margarita Grizzleay, Almena Hokenson, MD 04/20/17 2329

## 2017-04-20 NOTE — Progress Notes (Signed)
Subjective:  Patient ID: Mitchell King, male    DOB: 1951/06/20  Age: 66 y.o. MRN: [id number]  CC: HTN, dizziness  HPI Mitchell King is a 65 y.o. male with a medical history of COPD, HTN, Diabetes (self reported), and alcoholism presents as a new patient with complaint of dizziness and HTN. Onset of dizziness x1 month ago. Related to standing from a supine or seated position. Sees "small spots" when he becomes dizzy. Has noted there is progressive "shaking" all over body associated to his dizziness. Has been sober of alcohol approximately 15 years. Dizziness and shaking become severe and states he has to brace himself against a wall or he may collapse. Thinks there may also be tingling of all the extremities but is not able to elaborate further. Also relates subjective fever but unable to elaborate further. Mild headache but does not know if related. Does not endorse CP, SOB, abdominal pain, or GI/GU sxs.    *Patient is a poor historian.     Outpatient Medications Prior to Visit  Medication Sig Dispense Refill  . albuterol (PROVENTIL HFA;VENTOLIN HFA) 108 (90 BASE) MCG/ACT inhaler Inhale 2 puffs into the lungs every 6 (six) hours as needed for wheezing or shortness of breath. (Patient not taking: Reported on 04/20/2017) 1 Inhaler 2  . azithromycin (ZITHROMAX) 250 MG tablet Take 1 tablet (250 mg total) by mouth daily. Take first 2 tablets together, then 1 every day until finished. (Patient not taking: Reported on 04/20/2017) 6 tablet 0  . PredniSONE 10 MG KIT 12 day dose pack po (Patient not taking: Reported on 04/20/2017) 1 kit 0   No facility-administered medications prior to visit.      ROS Review of Systems  Constitutional: Positive for fever. Negative for chills and malaise/fatigue.  Eyes: Negative for blurred vision.  Respiratory: Negative for cough and shortness of breath.   Cardiovascular: Negative for chest pain, palpitations and leg swelling.  Gastrointestinal: Negative for  abdominal pain, blood in stool, constipation, diarrhea, melena, nausea and vomiting.  Genitourinary: Negative for dysuria, frequency and hematuria.  Musculoskeletal: Negative for joint pain and myalgias.  Skin: Negative for rash.  Neurological: Positive for dizziness, tingling, tremors, weakness and headaches.  Psychiatric/Behavioral: Negative for depression. The patient is not nervous/anxious.     Objective:  Blood pressure (!) 159/80, pulse 83, temperature 97.9 F (36.6 C), temperature source Oral, weight 171 lb 9.6 oz (77.8 kg), SpO2 95 %.   Physical Exam  Constitutional: He is oriented to person, place, and time.  thin, NAD, polite  HENT:  Head: Normocephalic and atraumatic.  Mouth/Throat: No oropharyngeal exudate.  Eyes: No scleral icterus.  Neck: Normal range of motion. Neck supple. No thyromegaly present.  Cardiovascular: Normal rate and normal heart sounds.   No murmur heard. Occasional extra beat  Pulmonary/Chest: Effort normal and breath sounds normal. No respiratory distress. He has no wheezes. He has no rales.  Abdominal: Soft. Bowel sounds are normal. There is no tenderness.  Musculoskeletal: He exhibits no edema.  Lymphadenopathy:    He has no cervical adenopathy.  Neurological: He is alert and oriented to person, place, and time. No cranial nerve deficit. Coordination normal.  Skin: Skin is warm and dry. No rash noted. No erythema. No pallor.  Psychiatric: He has a normal mood and affect. His behavior is normal. Thought content normal.  Somewhat tangential  Vitals reviewed.    Assessment & Plan:    1. Orthostatic hypotension - Will need further evaluation but needs to have  evaluation of symptoms emergently at hospital first, e.g. Troponin, CXR  2. Light headedness - EKG 12-Lead with t-wave inversions in V1 and V2.  - Urinalysis Dipstick  3. Nonspecific abnormal electrocardiogram (ECG) (EKG) - EKG 12-Lead with t-wave inversions in V1 and V2.   4.  Irregular heart beat - Pt will be sent to the ED for acute symptoms but will likely be referred to cardiology.    5. Hyperglycemia - HgB A1c 5.7% in clinic today  6. Prediabetes - HgBA1c 5.7% in clinic today  7. Hypertension, unspecified type - EKG 12-Lead with t-wave inversions in V1 and V2.  - Urinalysis Dipstick with protein 2+  8. History of alcoholism ([place]) - Reportedly 15 years sober.  Follow-up: Return if symptoms worsen or fail to improve, for after ED visit.   Clent Demark PA

## 2017-04-20 NOTE — Patient Instructions (Addendum)
Please report to the ED for further evaluation. You have an abnormal ECG that is worrisome when considering your light headedness, hypertension, and history of alcoholism.  Orthostatic Hypotension Orthostatic hypotension is a sudden drop in blood pressure that happens when you quickly change positions, such as when you get up from a seated or lying position. Blood pressure is a measurement of how strongly, or weakly, your blood is pressing against the walls of your arteries. Arteries are blood vessels that carry blood from your heart throughout your body. When blood pressure is too low, you may not get enough blood to your brain or to the rest of your organs. This can cause weakness, light-headedness, rapid heartbeat, and fainting. This can last for just a few seconds or for up to a few minutes. Orthostatic hypotension is usually not a serious problem. However, if it happens frequently or gets worse, it may be a sign of something more serious. What are the causes? This condition may be caused by: Sudden changes in posture, such as standing up quickly after you have been sitting or lying down. Blood loss. Loss of body fluids (dehydration). Heart problems. Hormone (endocrine) problems. Pregnancy. Severe infection. Lack of certain nutrients. Severe allergic reactions (anaphylaxis). Certain medicines, such as blood pressure medicine or medicines that make the body lose excess fluids (diuretics). Sometimes, this condition can be caused by not taking medicine as directed, such as taking too much of a certain medicine.  What increases the risk? Certain factors can make you more likely to develop orthostatic hypotension, including: Age. Risk increases as you get older. Conditions that affect the heart or the central nervous system. Taking certain medicines, such as blood pressure medicine or diuretics. Being pregnant.  What are the signs or symptoms? Symptoms of this condition may  include: Weakness. Light-headedness. Dizziness. Blurred vision. Fatigue. Rapid heartbeat. Fainting, in severe cases.  How is this diagnosed? This condition is diagnosed based on: Your medical history. Your symptoms. Your blood pressure measurement. Your health care provider will check your blood pressure when you are: Lying down. Sitting. Standing.  A blood pressure reading is recorded as two numbers, such as "120 over 80" (or 120/80). The first ("top") number is called the systolic pressure. It is a measure of the pressure in your arteries as your heart beats. The second ("bottom") number is called the diastolic pressure. It is a measure of the pressure in your arteries when your heart relaxes between beats. Blood pressure is measured in a unit called mm Hg. Healthy blood pressure for adults is 120/80. If your blood pressure is below 90/60, you may be diagnosed with hypotension. Other information or tests that may be used to diagnose orthostatic hypotension include: Your other vital signs, such as your heart rate and temperature. Blood tests. Tilt table test. For this test, you will be safely secured to a table that moves you from a lying position to an upright position. Your heart rhythm and blood pressure will be monitored during the test.  How is this treated? Treatment for this condition may include: Changing your diet. This may involve eating more salt (sodium) or drinking more water. Taking medicines to raise your blood pressure. Changing the dosage of certain medicines you are taking that might be lowering your blood pressure. Wearing compression stockings. These stockings help to prevent blood clots and reduce swelling in your legs.  In some cases, you may need to go to the hospital for: Fluid replacement. This means you will receive fluids  through an IV tube. Blood replacement. This means you will receive donated blood through an IV tube (transfusion). Treating an  infection or heart problems, if this applies. Monitoring. You may need to be monitored while medicines that you are taking wear off.  Follow these instructions at home: Eating and drinking  Drink enough fluid to keep your urine clear or pale yellow. Eat a healthy diet and follow instructions from your health care provider about eating or drinking restrictions. A healthy diet includes: Fresh fruits and vegetables. Whole grains. Lean meats. Low-fat dairy products. Eat extra salt only as directed. Do not add extra salt to your diet unless your health care provider told you to do that. Eat frequent, small meals. Avoid standing up suddenly after eating. Medicines Take over-the-counter and prescription medicines only as told by your health care provider. Follow instructions from your health care provider about changing the dosage of your current medicines, if this applies. Do not stop or adjust any of your medicines on your own. General instructions Wear compression stockings as told by your health care provider. Get up slowly from lying down or sitting positions. This gives your blood pressure a chance to adjust. Avoid hot showers and excessive heat as directed by your health care provider. Return to your normal activities as told by your health care provider. Ask your health care provider what activities are safe for you. Do not use any products that contain nicotine or tobacco, such as cigarettes and e-cigarettes. If you need help quitting, ask your health care provider. Keep all follow-up visits as told by your health care provider. This is important. Contact a health care provider if: You vomit. You have diarrhea. You have a fever for more than 2-3 days. You feel more thirsty than usual. You feel weak and tired. Get help right away if: You have chest pain. You have a fast or irregular heartbeat. You develop numbness in any part of your body. You cannot move your arms or your  legs. You have trouble speaking. You become sweaty or feel lightheaded. You faint. You feel short of breath. You have trouble staying awake. You feel confused. This information is not intended to replace advice given to you by your health care provider. Make sure you discuss any questions you have with your health care provider. Document Released: 05/28/2002 Document Revised: 02/24/2016 Document Reviewed: 11/28/2015 Elsevier Interactive Patient Education  2018 [employer].

## 2017-04-20 NOTE — Discharge Instructions (Signed)
Labs are normal here tonight.  We did not obtain an A1c.  Please follow-up with your doctor.  Return here if you worsen any time.

## 2017-04-20 NOTE — ED Notes (Addendum)
PT states understanding of care given, follow up care, and medication prescribed. PT ambulated from ED to car with a steady gait. 

## 2017-04-21 ENCOUNTER — Telehealth (INDEPENDENT_AMBULATORY_CARE_PROVIDER_SITE_OTHER): Payer: Self-pay | Admitting: Physician Assistant

## 2017-04-21 NOTE — Telephone Encounter (Signed)
Mr Richarda OverlieWarton called to let PA Lily KocherGomez that he went yesterday to the Emergency room please, call him at [phone]

## 2017-04-21 NOTE — Telephone Encounter (Signed)
Fwd TO PCP. Maryjean Mornempestt S Roberts, CMA

## 2017-04-22 NOTE — Telephone Encounter (Signed)
I will see patient on f/u. I have read his ED note. Please be sure he is scheduled within the next 2 weeks.

## 2017-04-28 NOTE — Telephone Encounter (Signed)
Patient has an appointment with you on  11/15 @ 4pm  Thank you

## 2017-04-29 NOTE — Telephone Encounter (Signed)
Noted  

## 2017-05-02 ENCOUNTER — Ambulatory Visit (INDEPENDENT_AMBULATORY_CARE_PROVIDER_SITE_OTHER): Payer: Medicare Other | Admitting: Physician Assistant

## 2017-05-05 ENCOUNTER — Encounter (INDEPENDENT_AMBULATORY_CARE_PROVIDER_SITE_OTHER): Payer: Self-pay | Admitting: Physician Assistant

## 2017-05-05 ENCOUNTER — Ambulatory Visit (INDEPENDENT_AMBULATORY_CARE_PROVIDER_SITE_OTHER): Payer: Medicare Other | Admitting: Physician Assistant

## 2017-05-05 ENCOUNTER — Other Ambulatory Visit: Payer: Self-pay

## 2017-05-05 VITALS — BP 139/82 | HR 70 | Temp 97.8°F | Wt 180.6 lb

## 2017-05-05 DIAGNOSIS — Z1159 Encounter for screening for other viral diseases: Secondary | ICD-10-CM

## 2017-05-05 DIAGNOSIS — R600 Localized edema: Secondary | ICD-10-CM

## 2017-05-05 DIAGNOSIS — J441 Chronic obstructive pulmonary disease with (acute) exacerbation: Secondary | ICD-10-CM | POA: Diagnosis not present

## 2017-05-05 DIAGNOSIS — I951 Orthostatic hypotension: Secondary | ICD-10-CM | POA: Diagnosis not present

## 2017-05-05 DIAGNOSIS — R0600 Dyspnea, unspecified: Secondary | ICD-10-CM | POA: Diagnosis not present

## 2017-05-05 DIAGNOSIS — R42 Dizziness and giddiness: Secondary | ICD-10-CM

## 2017-05-05 DIAGNOSIS — Z114 Encounter for screening for human immunodeficiency virus [HIV]: Secondary | ICD-10-CM

## 2017-05-05 DIAGNOSIS — I4510 Unspecified right bundle-branch block: Secondary | ICD-10-CM | POA: Diagnosis not present

## 2017-05-05 MED ORDER — HYDROCHLOROTHIAZIDE 12.5 MG PO TABS: 12.5000 mg | ORAL_TABLET | Freq: Every day | ORAL | 2 refills | Status: DC

## 2017-05-05 MED ORDER — ALBUTEROL SULFATE HFA 108 (90 BASE) MCG/ACT IN AERS: 2.0000 | INHALATION_SPRAY | RESPIRATORY_TRACT | 5 refills | Status: DC | PRN

## 2017-05-05 MED ORDER — AZITHROMYCIN 250 MG PO TABS: ORAL_TABLET | ORAL | 0 refills | Status: DC

## 2017-05-05 MED FILL — !VENTOLIN HFA INHALER: 108 (90 BAS | 25 days supply | Qty: 18 | Fill #0

## 2017-05-05 MED FILL — AZITHROMYCIN 250 MG TABLET: 250 | 5 days supply | Qty: 6 | Fill #0

## 2017-05-05 MED FILL — ?HYDROCHLOROTHIAZIDE 12.5MG: 12.5 | 30 days supply | Qty: 30 | Fill #0

## 2017-05-05 NOTE — Addendum Note (Signed)
Addended by: Sindy MessingGOMEZ, Cinthia Rodden D on: 05/05/2017 05:14 PM   Modules accepted: Orders

## 2017-05-05 NOTE — Progress Notes (Addendum)
Subjective:  Patient ID: Mitchell King, male    DOB: 04/27/1951  Age: 66 y.o. MRN: [id number]  CC:  Dizziness, HA, HTN  HPI Mitchell GivensJerry King is a 66 y.o. male with a medical history of COPD, HTN, and prediabetes presents on hospital f/u for abnormal ECG, light headedness, irregular heartbeat, and orthostatic hypotension. Went to the ED on 04/20/17 and was found to have normal Troponin. ED ECG revealed incomplete RBBB, right ventricular hypertrophy, and age undetermined possible anterior infarct. Patient reports symptoms of PND, insidiously worsening SOB, claudication, lower extremity pitting edema on occasion. Does not endorse CP, palpitations, presyncope, or syncope. Requests refill of proventil as he feels it is effective for his COPD.     ROS Review of Systems  Constitutional: Negative for chills, fever and malaise/fatigue.  Eyes: Negative for blurred vision.  Respiratory: Positive for shortness of breath.   Cardiovascular: Positive for leg swelling and PND. Negative for chest pain and palpitations.  Gastrointestinal: Negative for abdominal pain and nausea.  Genitourinary: Negative for dysuria and hematuria.  Musculoskeletal: Negative for joint pain and myalgias.  Skin: Negative for rash.  Neurological: Negative for tingling and headaches.  Psychiatric/Behavioral: Negative for depression. The patient is nervous/anxious (sometimes at work).     Objective:  BP 139/82 (BP Location: Left Arm, Patient Position: Sitting, Cuff Size: Normal)   Pulse 70   Temp 97.8 F (36.6 C) (Oral)   Wt 180 lb 9.6 oz (81.9 kg)   SpO2 94%   BMI 23.83 kg/m   BP/Weight 05/05/2017 04/20/2017 04/20/2017  Systolic BP 139 140 159  Diastolic BP 82 91 80  Wt. (Lbs) 180.6 179.31 171.6  BMI 23.83 23.66 -      Physical Exam  Constitutional: He is oriented to person, place, and time.  Well developed, thin, NAD, polite  HENT:  Head: Normocephalic and atraumatic.  Eyes: No scleral icterus.  Neck: Normal  range of motion.  Cardiovascular: Normal rate, regular rhythm and normal heart sounds.  No murmur heard. No LE edema bilaterally. No carotid bruit bilaterally  Pulmonary/Chest: Effort normal. No respiratory distress. He has no wheezes. He has rales (lung base bilaterally and less so to the apices).  Abdominal: Soft. Bowel sounds are normal. He exhibits no distension.  Musculoskeletal: He exhibits no edema.  Neurological: He is alert and oriented to person, place, and time. No cranial nerve deficit. Coordination normal.  Skin: Skin is warm and dry. No rash noted. No erythema. No pallor.  Psychiatric: He has a normal mood and affect. His behavior is normal. Thought content normal.  Vitals reviewed.    Assessment & Plan:    1. Light headedness - likely secondary to pulmonary etiology but can not fully exclude cardiac etiology in light of abnormal ECGs. Will refer to cardiology.  - Also considering COPD vs Pulmonary HTN (Cor Pulmonale) vs ILD  2. Lower extremity edema - Per patient history but none currently. Concerning in the setting of HTN and previous alcoholism  3. Incomplete right bundle branch block - Per ED EKG - Referral to cardiology  4. COPD exacerbation (HCC) - Begin Ventolin - Begin Azithromycin  5. Dyspnea, unspecified type - ANA w/Reflex - Begin Azithromycin  6. Screening for HIV (human immunodeficiency virus) - HIV antibody  7. Need for hepatitis C screening test - HCV Ab   Meds ordered this encounter  Medications  . albuterol (PROVENTIL HFA;VENTOLIN HFA) 108 (90 Base) MCG/ACT inhaler    Sig: Inhale 2 puffs every 4 (four) hours as  needed into the lungs for wheezing or shortness of breath.    Dispense:  1 Inhaler    Refill:  5    Order Specific Question:   Supervising Provider    Answer:   Quentin AngstJEGEDE, OLUGBEMIGA E [id number]]  . azithromycin (ZITHROMAX) 250 MG tablet    Sig: Take two tablets on day one then one tablet daily thereafter    Dispense:  6 tablet     Refill:  0    Order Specific Question:   Supervising Provider    Answer:   Quentin AngstJEGEDE, OLUGBEMIGA E [id number]]  . hydrochlorothiazide (HYDRODIURIL) 12.5 MG tablet    Sig: Take 1 tablet (12.5 mg total) daily by mouth.    Dispense:  30 tablet    Refill:  2    Order Specific Question:   Supervising Provider    Answer:   Quentin AngstJEGEDE, OLUGBEMIGA E [[id number]]    Follow-up: 4 weeks  Loletta Specteroger David Aliannah Holstrom PA

## 2017-05-05 NOTE — Patient Instructions (Signed)
Please drink the eight 8 oz cups of water per day. This is necessary in the setting of PND, lower extremity edema, hypertension, and use of diuretic pills. Be aware of orthostatic hypotension.  Orthostatic Hypotension Orthostatic hypotension is a sudden drop in blood pressure that happens when you quickly change positions, such as when you get up from a seated or lying position. Blood pressure is a measurement of how strongly, or weakly, your blood is pressing against the walls of your arteries. Arteries are blood vessels that carry blood from your heart throughout your body. When blood pressure is too low, you may not get enough blood to your brain or to the rest of your organs. This can cause weakness, light-headedness, rapid heartbeat, and fainting. This can last for just a few seconds or for up to a few minutes. Orthostatic hypotension is usually not a serious problem. However, if it happens frequently or gets worse, it may be a sign of something more serious. What are the causes? This condition may be caused by:  Sudden changes in posture, such as standing up quickly after you have been sitting or lying down.  Blood loss.  Loss of body fluids (dehydration).  Heart problems.  Hormone (endocrine) problems.  Pregnancy.  Severe infection.  Lack of certain nutrients.  Severe allergic reactions (anaphylaxis).  Certain medicines, such as blood pressure medicine or medicines that make the body lose excess fluids (diuretics). Sometimes, this condition can be caused by not taking medicine as directed, such as taking too much of a certain medicine.  What increases the risk? Certain factors can make you more likely to develop orthostatic hypotension, including:  Age. Risk increases as you get older.  Conditions that affect the heart or the central nervous system.  Taking certain medicines, such as blood pressure medicine or diuretics.  Being pregnant.  What are the signs or  symptoms? Symptoms of this condition may include:  Weakness.  Light-headedness.  Dizziness.  Blurred vision.  Fatigue.  Rapid heartbeat.  Fainting, in severe cases.  How is this diagnosed? This condition is diagnosed based on:  Your medical history.  Your symptoms.  Your blood pressure measurement. Your health care provider will check your blood pressure when you are: ? Lying down. ? Sitting. ? Standing.  A blood pressure reading is recorded as two numbers, such as "120 over 80" (or 120/80). The first ("top") number is called the systolic pressure. It is a measure of the pressure in your arteries as your heart beats. The second ("bottom") number is called the diastolic pressure. It is a measure of the pressure in your arteries when your heart relaxes between beats. Blood pressure is measured in a unit called mm Hg. Healthy blood pressure for adults is 120/80. If your blood pressure is below 90/60, you may be diagnosed with hypotension. Other information or tests that may be used to diagnose orthostatic hypotension include:  Your other vital signs, such as your heart rate and temperature.  Blood tests.  Tilt table test. For this test, you will be safely secured to a table that moves you from a lying position to an upright position. Your heart rhythm and blood pressure will be monitored during the test.  How is this treated? Treatment for this condition may include:  Changing your diet. This may involve eating more salt (sodium) or drinking more water.  Taking medicines to raise your blood pressure.  Changing the dosage of certain medicines you are taking that might be lowering  your blood pressure.  Wearing compression stockings. These stockings help to prevent blood clots and reduce swelling in your legs.  In some cases, you may need to go to the hospital for:  Fluid replacement. This means you will receive fluids through an IV tube.  Blood replacement. This means  you will receive donated blood through an IV tube (transfusion).  Treating an infection or heart problems, if this applies.  Monitoring. You may need to be monitored while medicines that you are taking wear off.  Follow these instructions at home: Eating and drinking   Drink enough fluid to keep your urine clear or pale yellow.  Eat a healthy diet and follow instructions from your health care provider about eating or drinking restrictions. A healthy diet includes: ? Fresh fruits and vegetables. ? Whole grains. ? Lean meats. ? Low-fat dairy products.  Eat extra salt only as directed. Do not add extra salt to your diet unless your health care provider told you to do that.  Eat frequent, small meals.  Avoid standing up suddenly after eating. Medicines  Take over-the-counter and prescription medicines only as told by your health care provider. ? Follow instructions from your health care provider about changing the dosage of your current medicines, if this applies. ? Do not stop or adjust any of your medicines on your own. General instructions  Wear compression stockings as told by your health care provider.  Get up slowly from lying down or sitting positions. This gives your blood pressure a chance to adjust.  Avoid hot showers and excessive heat as directed by your health care provider.  Return to your normal activities as told by your health care provider. Ask your health care provider what activities are safe for you.  Do not use any products that contain nicotine or tobacco, such as cigarettes and e-cigarettes. If you need help quitting, ask your health care provider.  Keep all follow-up visits as told by your health care provider. This is important. Contact a health care provider if:  You vomit.  You have diarrhea.  You have a fever for more than 2-3 days.  You feel more thirsty than usual.  You feel weak and tired. Get help right away if:  You have chest  pain.  You have a fast or irregular heartbeat.  You develop numbness in any part of your body.  You cannot move your arms or your legs.  You have trouble speaking.  You become sweaty or feel lightheaded.  You faint.  You feel short of breath.  You have trouble staying awake.  You feel confused. This information is not intended to replace advice given to you by your health care provider. Make sure you discuss any questions you have with your health care provider. Document Released: 05/28/2002 Document Revised: 02/24/2016 Document Reviewed: 11/28/2015 Elsevier Interactive Patient Education  2018 [employer]

## 2017-05-06 LAB — SPECIMEN STATUS REPORT

## 2017-05-06 LAB — COMMENT2 - HEP PANEL

## 2017-05-06 LAB — HIV ANTIBODY (ROUTINE TESTING W REFLEX): HIV Screen 4th Generation wRfx: NONREACTIVE

## 2017-05-06 LAB — ANA W/REFLEX: Anti Nuclear Antibody(ANA): NEGATIVE

## 2017-05-06 LAB — HEPATITIS C ANTIBODY (REFLEX): HCV Ab: >11 s/co ratio — ABNORMAL HIGH (ref 0.0–0.9)

## 2017-05-09 ENCOUNTER — Telehealth (INDEPENDENT_AMBULATORY_CARE_PROVIDER_SITE_OTHER): Payer: Self-pay

## 2017-05-09 NOTE — Telephone Encounter (Signed)
Patient aware of results and will be in clinic on 12/13 for additional blood work and updated US. Maryjean Mornempestt S Roberts, CMA

## 2017-05-09 NOTE — Telephone Encounter (Signed)
-----   Message from Loletta Specteroger David Gomez, PA-C sent at 05/09/2017  9:34 AM EST ----- HCV positive. Patient should return to see me. We will have to order more blood work, get an updated RUQ US, and make sure he is in process of getting his [employer] card/[employer] discount card.

## 2017-06-02 ENCOUNTER — Ambulatory Visit (INDEPENDENT_AMBULATORY_CARE_PROVIDER_SITE_OTHER): Payer: Medicare Other | Admitting: Physician Assistant

## 2017-06-09 ENCOUNTER — Ambulatory Visit (INDEPENDENT_AMBULATORY_CARE_PROVIDER_SITE_OTHER): Payer: Medicare Other | Admitting: Physician Assistant

## 2017-07-25 MED FILL — ?HYDROCHLOROTHIAZIDE 12.5MG: 12.5 | 30 days supply | Qty: 30 | Fill #1

## 2017-07-25 MED FILL — !VENTOLIN HFA INHALER: 108 (90 BAS | 16 days supply | Qty: 18 | Fill #1

## 2017-09-09 MED FILL — ALBUTEROL SULFATE HFA 108 (: 108 (90 BAS | 16 days supply | Qty: 18 | Fill #2

## 2017-09-09 MED FILL — HYDROCHLOROTHIAZIDE 12.5 MG: 12.5 | 30 days supply | Qty: 30 | Fill #2

## 2020-08-04 ENCOUNTER — Encounter (HOSPITAL_COMMUNITY): Payer: Self-pay | Admitting: *Deleted

## 2020-08-04 ENCOUNTER — Emergency Department (HOSPITAL_COMMUNITY): Payer: Medicare Other

## 2020-08-04 ENCOUNTER — Inpatient Hospital Stay (HOSPITAL_COMMUNITY)
Admission: EM | Admit: 2020-08-04 | Discharge: 2020-08-16 | DRG: 208 | Disposition: A | Discharge status: Skilled Nursing Facility | Payer: Medicare Other | Attending: Internal Medicine | Admitting: Internal Medicine

## 2020-08-04 ENCOUNTER — Other Ambulatory Visit: Payer: Self-pay

## 2020-08-04 DIAGNOSIS — Z86711 Personal history of pulmonary embolism: Secondary | ICD-10-CM

## 2020-08-04 DIAGNOSIS — I5023 Acute on chronic systolic (congestive) heart failure: Secondary | ICD-10-CM | POA: Diagnosis not present

## 2020-08-04 DIAGNOSIS — I50813 Acute on chronic right heart failure: Secondary | ICD-10-CM | POA: Diagnosis present

## 2020-08-04 DIAGNOSIS — K567 Ileus, unspecified: Secondary | ICD-10-CM

## 2020-08-04 DIAGNOSIS — E119 Type 2 diabetes mellitus without complications: Secondary | ICD-10-CM

## 2020-08-04 DIAGNOSIS — I509 Heart failure, unspecified: Secondary | ICD-10-CM

## 2020-08-04 DIAGNOSIS — K761 Chronic passive congestion of liver: Secondary | ICD-10-CM | POA: Diagnosis present

## 2020-08-04 DIAGNOSIS — R778 Other specified abnormalities of plasma proteins: Secondary | ICD-10-CM

## 2020-08-04 DIAGNOSIS — I10 Essential (primary) hypertension: Secondary | ICD-10-CM | POA: Diagnosis present

## 2020-08-04 DIAGNOSIS — I2609 Other pulmonary embolism with acute cor pulmonale: Principal | ICD-10-CM

## 2020-08-04 DIAGNOSIS — F17210 Nicotine dependence, cigarettes, uncomplicated: Secondary | ICD-10-CM | POA: Diagnosis present

## 2020-08-04 DIAGNOSIS — R0602 Shortness of breath: Secondary | ICD-10-CM | POA: Diagnosis present

## 2020-08-04 DIAGNOSIS — J189 Pneumonia, unspecified organism: Secondary | ICD-10-CM

## 2020-08-04 DIAGNOSIS — I160 Hypertensive urgency: Secondary | ICD-10-CM | POA: Diagnosis present

## 2020-08-04 DIAGNOSIS — E874 Mixed disorder of acid-base balance: Secondary | ICD-10-CM | POA: Diagnosis present

## 2020-08-04 DIAGNOSIS — J441 Chronic obstructive pulmonary disease with (acute) exacerbation: Secondary | ICD-10-CM | POA: Diagnosis present

## 2020-08-04 DIAGNOSIS — Z0189 Encounter for other specified special examinations: Secondary | ICD-10-CM

## 2020-08-04 DIAGNOSIS — E7220 Disorder of urea cycle metabolism, unspecified: Secondary | ICD-10-CM | POA: Diagnosis present

## 2020-08-04 DIAGNOSIS — N3289 Other specified disorders of bladder: Secondary | ICD-10-CM | POA: Diagnosis present

## 2020-08-04 DIAGNOSIS — Z7951 Long term (current) use of inhaled steroids: Secondary | ICD-10-CM

## 2020-08-04 DIAGNOSIS — J9600 Acute respiratory failure, unspecified whether with hypoxia or hypercapnia: Secondary | ICD-10-CM

## 2020-08-04 DIAGNOSIS — R042 Hemoptysis: Secondary | ICD-10-CM | POA: Diagnosis not present

## 2020-08-04 DIAGNOSIS — E440 Moderate protein-calorie malnutrition: Secondary | ICD-10-CM | POA: Diagnosis present

## 2020-08-04 DIAGNOSIS — M7989 Other specified soft tissue disorders: Secondary | ICD-10-CM | POA: Diagnosis not present

## 2020-08-04 DIAGNOSIS — D7589 Other specified diseases of blood and blood-forming organs: Secondary | ICD-10-CM | POA: Diagnosis present

## 2020-08-04 DIAGNOSIS — J9621 Acute and chronic respiratory failure with hypoxia: Secondary | ICD-10-CM | POA: Diagnosis present

## 2020-08-04 DIAGNOSIS — J811 Chronic pulmonary edema: Secondary | ICD-10-CM

## 2020-08-04 DIAGNOSIS — I5033 Acute on chronic diastolic (congestive) heart failure: Secondary | ICD-10-CM | POA: Diagnosis not present

## 2020-08-04 DIAGNOSIS — I2699 Other pulmonary embolism without acute cor pulmonale: Secondary | ICD-10-CM

## 2020-08-04 DIAGNOSIS — J449 Chronic obstructive pulmonary disease, unspecified: Secondary | ICD-10-CM | POA: Diagnosis not present

## 2020-08-04 DIAGNOSIS — J9601 Acute respiratory failure with hypoxia: Secondary | ICD-10-CM | POA: Diagnosis not present

## 2020-08-04 DIAGNOSIS — I272 Pulmonary hypertension, unspecified: Secondary | ICD-10-CM | POA: Diagnosis present

## 2020-08-04 DIAGNOSIS — Z9114 Patient's other noncompliance with medication regimen: Secondary | ICD-10-CM

## 2020-08-04 DIAGNOSIS — F1010 Alcohol abuse, uncomplicated: Secondary | ICD-10-CM | POA: Diagnosis present

## 2020-08-04 DIAGNOSIS — G9340 Encephalopathy, unspecified: Secondary | ICD-10-CM | POA: Diagnosis not present

## 2020-08-04 DIAGNOSIS — R140 Abdominal distension (gaseous): Secondary | ICD-10-CM

## 2020-08-04 DIAGNOSIS — Z79899 Other long term (current) drug therapy: Secondary | ICD-10-CM | POA: Diagnosis not present

## 2020-08-04 DIAGNOSIS — Z978 Presence of other specified devices: Secondary | ICD-10-CM

## 2020-08-04 DIAGNOSIS — K8020 Calculus of gallbladder without cholecystitis without obstruction: Secondary | ICD-10-CM | POA: Diagnosis present

## 2020-08-04 DIAGNOSIS — I5082 Biventricular heart failure: Secondary | ICD-10-CM | POA: Diagnosis present

## 2020-08-04 DIAGNOSIS — I110 Hypertensive heart disease with heart failure: Secondary | ICD-10-CM | POA: Diagnosis present

## 2020-08-04 DIAGNOSIS — K709 Alcoholic liver disease, unspecified: Secondary | ICD-10-CM | POA: Diagnosis present

## 2020-08-04 DIAGNOSIS — F05 Delirium due to known physiological condition: Secondary | ICD-10-CM | POA: Diagnosis present

## 2020-08-04 DIAGNOSIS — Z20822 Contact with and (suspected) exposure to covid-19: Secondary | ICD-10-CM | POA: Diagnosis present

## 2020-08-04 DIAGNOSIS — J9602 Acute respiratory failure with hypercapnia: Secondary | ICD-10-CM | POA: Diagnosis not present

## 2020-08-04 DIAGNOSIS — I5031 Acute diastolic (congestive) heart failure: Secondary | ICD-10-CM | POA: Diagnosis not present

## 2020-08-04 DIAGNOSIS — Z9911 Dependence on respirator [ventilator] status: Secondary | ICD-10-CM | POA: Diagnosis not present

## 2020-08-04 DIAGNOSIS — I2601 Septic pulmonary embolism with acute cor pulmonale: Secondary | ICD-10-CM | POA: Diagnosis not present

## 2020-08-04 DIAGNOSIS — T502X5A Adverse effect of carbonic-anhydrase inhibitors, benzothiadiazides and other diuretics, initial encounter: Secondary | ICD-10-CM | POA: Diagnosis present

## 2020-08-04 DIAGNOSIS — E876 Hypokalemia: Secondary | ICD-10-CM | POA: Diagnosis not present

## 2020-08-04 DIAGNOSIS — R338 Other retention of urine: Secondary | ICD-10-CM | POA: Diagnosis present

## 2020-08-04 DIAGNOSIS — Z6822 Body mass index (BMI) 22.0-22.9, adult: Secondary | ICD-10-CM

## 2020-08-04 DIAGNOSIS — J9622 Acute and chronic respiratory failure with hypercapnia: Secondary | ICD-10-CM | POA: Diagnosis present

## 2020-08-04 DIAGNOSIS — R0600 Dyspnea, unspecified: Secondary | ICD-10-CM

## 2020-08-04 DIAGNOSIS — I50811 Acute right heart failure: Secondary | ICD-10-CM | POA: Diagnosis not present

## 2020-08-04 DIAGNOSIS — R109 Unspecified abdominal pain: Secondary | ICD-10-CM

## 2020-08-04 DIAGNOSIS — G9341 Metabolic encephalopathy: Secondary | ICD-10-CM | POA: Diagnosis present

## 2020-08-04 DIAGNOSIS — I50810 Right heart failure, unspecified: Secondary | ICD-10-CM

## 2020-08-04 HISTORY — DX: Dyspnea, unspecified: R06.00

## 2020-08-04 HISTORY — DX: Heart failure, unspecified: I50.9

## 2020-08-04 LAB — BASIC METABOLIC PANEL
Anion gap: 9 (ref 5–15)
BUN: 27 mg/dL — ABNORMAL HIGH (ref 8–23)
CO2: 27 mmol/L (ref 22–32)
Calcium: 8.8 mg/dL — ABNORMAL LOW (ref 8.9–10.3)
Chloride: 99 mmol/L (ref 98–111)
Creatinine, Ser: 1.25 mg/dL — ABNORMAL HIGH (ref 0.61–1.24)
GFR, Estimated: >60 mL/min (ref >60)
Glucose, Bld: 101 mg/dL — ABNORMAL HIGH (ref 70–99)
Potassium: 4.4 mmol/L (ref 3.5–5.1)
Sodium: 135 mmol/L (ref 135–145)

## 2020-08-04 LAB — CBC
HCT: 54.4 % — ABNORMAL HIGH (ref 39.0–52.0)
Hemoglobin: 16.2 g/dL (ref 13.0–17.0)
MCH: 30.5 pg (ref 26.0–34.0)
MCHC: 29.8 g/dL — ABNORMAL LOW (ref 30.0–36.0)
MCV: 102.3 fL — ABNORMAL HIGH (ref 80.0–100.0)
Platelets: 247 10*3/uL (ref 150–400)
RBC: 5.32 MIL/uL (ref 4.22–5.81)
RDW: 14.5 % (ref 11.5–15.5)
WBC: 4.3 10*3/uL (ref 4.0–10.5)
nRBC: 0 % (ref 0.0–0.2)

## 2020-08-04 LAB — PROTIME-INR
INR: 1.1 (ref 0.8–1.2)
Prothrombin Time: 14 seconds (ref 11.4–15.2)

## 2020-08-04 LAB — TROPONIN I (HIGH SENSITIVITY): Troponin I (High Sensitivity): 44 ng/L — ABNORMAL HIGH (ref <18)

## 2020-08-04 LAB — BRAIN NATRIURETIC PEPTIDE: B Natriuretic Peptide: 690.9 pg/mL — ABNORMAL HIGH (ref 0.0–100.0)

## 2020-08-04 MED ORDER — ASPIRIN 81 MG PO CHEW
324.0000 mg | CHEWABLE_TABLET | Freq: Once | ORAL | Status: AC
  Administered 2020-08-04: 324 mg via ORAL
  Filled 2020-08-04: qty 4

## 2020-08-04 MED ORDER — SODIUM CHLORIDE 0.9% FLUSH: 3.0000 mL | INTRAVENOUS | Status: DC | PRN

## 2020-08-04 MED ORDER — CARVEDILOL 12.5 MG PO TABS
6.2500 mg | ORAL_TABLET | Freq: Two times a day (BID) | ORAL | Status: DC
  Administered 2020-08-05: 6.25 mg via ORAL
  Filled 2020-08-04: qty 1

## 2020-08-04 MED ORDER — ACETAMINOPHEN 325 MG PO TABS
650.0000 mg | ORAL_TABLET | ORAL | Status: DC | PRN
  Administered 2020-08-07 – 2020-08-08 (×2): 650 mg via ORAL
  Filled 2020-08-04 (×2): qty 2

## 2020-08-04 MED ORDER — FUROSEMIDE 10 MG/ML IJ SOLN
40.0000 mg | Freq: Two times a day (BID) | INTRAMUSCULAR | Status: DC
  Administered 2020-08-05 – 2020-08-06 (×4): 40 mg via INTRAVENOUS
  Filled 2020-08-04 (×4): qty 4

## 2020-08-04 MED ORDER — ENOXAPARIN SODIUM 40 MG/0.4ML ~~LOC~~ SOLN: 40.0000 mg | Freq: Every day | SUBCUTANEOUS | Status: DC

## 2020-08-04 MED ORDER — GUAIFENESIN 100 MG/5ML PO SYRP
100.0000 mg | ORAL_SOLUTION | Freq: Every evening | ORAL | Status: DC | PRN
  Filled 2020-08-04: qty 5

## 2020-08-04 MED ORDER — NITROGLYCERIN IN D5W 200-5 MCG/ML-% IV SOLN
0.0000 ug/min | INTRAVENOUS | Status: DC
  Administered 2020-08-04: 5 ug/min via INTRAVENOUS
  Filled 2020-08-04: qty 250

## 2020-08-04 MED ORDER — SODIUM CHLORIDE 0.9% FLUSH
3.0000 mL | Freq: Two times a day (BID) | INTRAVENOUS | Status: DC
  Administered 2020-08-05 – 2020-08-16 (×23): 3 mL via INTRAVENOUS

## 2020-08-04 MED ORDER — ALBUTEROL SULFATE HFA 108 (90 BASE) MCG/ACT IN AERS
2.0000 | INHALATION_SPRAY | RESPIRATORY_TRACT | Status: DC | PRN
  Administered 2020-08-05 – 2020-08-06 (×5): 2 via RESPIRATORY_TRACT
  Filled 2020-08-04: qty 6.7

## 2020-08-04 MED ORDER — SODIUM CHLORIDE 0.9 % IV SOLN
250.0000 mL | INTRAVENOUS | Status: DC | PRN
Start: 2020-08-04 — End: 2020-08-17

## 2020-08-04 MED ORDER — ONDANSETRON HCL 4 MG/2ML IJ SOLN
4.0000 mg | Freq: Four times a day (QID) | INTRAMUSCULAR | Status: DC | PRN
  Administered 2020-08-10 – 2020-08-11 (×2): 4 mg via INTRAVENOUS
  Filled 2020-08-04 (×2): qty 2

## 2020-08-04 MED ORDER — FUROSEMIDE 10 MG/ML IJ SOLN
40.0000 mg | INTRAMUSCULAR | Status: AC
  Administered 2020-08-04: 40 mg via INTRAVENOUS
  Filled 2020-08-04: qty 4

## 2020-08-04 MED ORDER — ACETAMINOPHEN 500 MG PO TABS: 500.0000 mg | ORAL_TABLET | Freq: Four times a day (QID) | ORAL | Status: DC | PRN

## 2020-08-04 NOTE — H&P (Signed)
History and Physical   Mitchell King WVP:[id number] DOB: 1950/09/11 DOA: 08/04/2020  Referring MD/NP/PA: Eber Hong, MD  PCP: Patient, No Pcp Per   Outpatient Specialists: None  Patient coming from: Home  Chief Complaint: Shortness of breath and leg swelling  HPI: Mitchell King is a 70 y.o. male with medical history significant of diet-controlled diabetes, COPD, hypertension, previous alcohol abuse, who has not seen doctors in years and has not been taking his medications for a while except for inhalers.  Patient has had progressive shortness of breath cough PND and orthopnea over the last 2 weeks.  He is also noted progressive worsening of lower extremity edema.  Patient try to take care of this conservatively.  He has been taking his medications mainly the inhalers as indicated but no relief.  The last time he saw the doctor was at the [place] 2 years ago and has not been there due to the Covid.  He has had appointment apparently to see a cardiologist which he has not done yet.  In the ER here patient was noted to have a picture of anasarca.  He has 3+ pedal edema and visibly short of breath.  Bilateral crackles and pictures in general of fluid overload.  The last echocardiogram in our system was from 2011 with normal EF.  Patient denied any chest pain.  Denied any previous heart attacks.  He however has been eating excessive salt.  He was noted to have significant hypertensive urgency with congestive heart failure.  Patient being admitted with acute CHF probably secondary to hypertensive heart disease..  ED Course: Temperature 98.4 blood pressure 172/99 pulse 87 respiratory rate of 26 and oxygen sat 73% on room air.  Sodium is 135 potassium 4.4 chloride 99 CO2 27 glucose 101 BUN is 27 creatinine 1.25 calcium 8.8.  BNP 690 troponin 44.  CBC mostly within normal PT 14 INR 1.1.  Chest x-ray showed congestive heart failure.  Patient being admitted with acute exacerbation of new CHF.  Review of  Systems: As per HPI otherwise 10 point review of systems negative.    Past Medical History:  Diagnosis Date  . COPD (chronic obstructive pulmonary disease) (HCC)   . Diabetes mellitus without complication (HCC)   . Hypertension     History reviewed. No pertinent surgical history.   reports that he has been smoking. He has never used smokeless tobacco. He reports that he does not drink alcohol and does not use drugs.  No Known Allergies  History reviewed. No pertinent family history.   Prior to Admission medications   Medication Sig Start Date End Date Taking? Authorizing Provider  acetaminophen (TYLENOL) 500 MG tablet Take 500-1,000 mg by mouth every 6 (six) hours as needed for mild pain (or headaches).   Yes [provider]  albuterol (PROVENTIL HFA;VENTOLIN HFA) 108 (90 Base) MCG/ACT inhaler Inhale 2 puffs every 4 (four) hours as needed into the lungs for wheezing or shortness of breath. 05/05/17 06/04/17 Yes Loletta Specter, PA-C  guaifenesin (ROBITUSSIN) 100 MG/5ML syrup Take 100 mg by mouth at bedtime as needed (for chest congestion).   Yes [provider]  Tiotropium Bromide-Olodaterol (STIOLTO RESPIMAT) 2.5-2.5 MCG/ACT AERS Inhale 1 puff into the lungs in the morning and at bedtime.   Yes [provider]  azithromycin (ZITHROMAX) 250 MG tablet Take two tablets on day one then one tablet daily thereafter Patient not taking: Reported on 08/04/2020 05/05/17   Loletta Specter, PA-C  hydrochlorothiazide (HYDRODIURIL) 12.5  MG tablet Take 1 tablet (12.5 mg total) daily by mouth. Patient not taking: Reported on 08/04/2020 05/05/17   Loletta Specter, PA-C  Fluticasone-Salmeterol (ADVAIR DISKUS) 250-50 MCG/DOSE AEPB Inhale 1 puff into the lungs 2 (two) times daily. 11/20/11 10/11/13  Reuben Likes, MD    Physical Exam: Vitals:   08/04/20 2200 08/04/20 2215 08/04/20 2230 08/04/20 2245  BP: (!) 159/98 (!) 168/104 (!) 167/123 (!) 157/93  Pulse: 87 87 81  80  Resp: (!) 26 (!) 26 18 18   Temp:      TempSrc:      SpO2: 94% 93% 96% 95%      Constitutional: Acutely ill looking with anasarca Vitals:   08/04/20 2200 08/04/20 2215 08/04/20 2230 08/04/20 2245  BP: (!) 159/98 (!) 168/104 (!) 167/123 (!) 157/93  Pulse: 87 87 81 80  Resp: (!) 26 (!) 26 18 18   Temp:      TempSrc:      SpO2: 94% 93% 96% 95%   Eyes: PERRL, lids and conjunctivae normal ENMT: Mucous membranes are moist. Posterior pharynx clear of any exudate or lesions.Normal dentition.  Neck: normal, supple, no masses, no thyromegaly Respiratory: Coarse breath sounds bilaterally with no wheeze but significant bilateral crackles. Normal respiratory effort. No accessory muscle use.  Cardiovascular: Regular rate and rhythm, no murmurs / rubs / gallops.  3+ extremity edema. 2+ pedal pulses. No carotid bruits.  Abdomen: no tenderness, no masses palpated. No hepatosplenomegaly. Bowel sounds positive.  Musculoskeletal: no clubbing / cyanosis. No joint deformity upper and lower extremities. Good ROM, no contractures. Normal muscle tone.  Skin: no rashes, lesions, ulcers. No induration Neurologic: CN 2-12 grossly intact. Sensation intact, DTR normal. Strength 5/5 in all 4.  Psychiatric: Normal judgment and insight. Alert and oriented x 3. Normal mood.     Labs on Admission: I have personally reviewed following labs and imaging studies  CBC: Recent Labs  Lab 08/04/20 1440  WBC 4.3  HGB 16.2  HCT 54.4*  MCV 102.3*  PLT 247   Basic Metabolic Panel: Recent Labs  Lab 08/04/20 1440  NA 135  K 4.4  CL 99  CO2 27  GLUCOSE 101*  BUN 27*  CREATININE 1.25*  CALCIUM 8.8*   GFR: CrCl cannot be calculated (Unknown ideal weight.). Liver Function Tests: No results for input(s): AST, ALT, ALKPHOS, BILITOT, PROT, ALBUMIN in the last 168 hours. No results for input(s): LIPASE, AMYLASE in the last 168 hours. No results for input(s): AMMONIA in the last 168 hours. Coagulation  Profile: Recent Labs  Lab 08/04/20 2055  INR 1.1   Cardiac Enzymes: No results for input(s): CKTOTAL, CKMB, CKMBINDEX, TROPONINI in the last 168 hours. BNP (last 3 results) No results for input(s): PROBNP in the last 8760 hours. HbA1C: No results for input(s): HGBA1C in the last 72 hours. CBG: No results for input(s): GLUCAP in the last 168 hours. Lipid Profile: No results for input(s): CHOL, HDL, LDLCALC, TRIG, CHOLHDL, LDLDIRECT in the last 72 hours. Thyroid Function Tests: No results for input(s): TSH, T4TOTAL, FREET4, T3FREE, THYROIDAB in the last 72 hours. Anemia Panel: No results for input(s): VITAMINB12, FOLATE, FERRITIN, TIBC, IRON, RETICCTPCT in the last 72 hours. Urine analysis:    Component Value Date/Time   BILIRUBINUR neg 04/20/2017 1447   PROTEINUR 100 04/20/2017 1447   UROBILINOGEN 1.0 04/20/2017 1447   NITRITE neg 04/20/2017 1447   LEUKOCYTESUR Negative 04/20/2017 1447   Sepsis Labs: @LABRCNTIP (procalcitonin:4,lacticidven:4) )No results found for this or any previous visit (  from the past 240 hour(s)).   Radiological Exams on Admission: DG Chest 2 View  Result Date: 08/04/2020 CLINICAL DATA:  Shortness of breath, leg swelling, decreased O2 sats. EXAM: CHEST - 2 VIEW COMPARISON:  10/11/2013. FINDINGS: Trachea is midline. Heart is enlarged. Thoracic aorta is calcified. Mild basilar predominant interstitial prominence and indistinctness with bibasilar atelectasis and small bilateral pleural effusions. IMPRESSION: Mild congestive heart failure. Electronically Signed   By: Leanna Battles M.D.   On: 08/04/2020 15:03    EKG: Independently reviewed.  Sinus rhythm with evidence of left ventricular hypertrophy by voltage criteria.  Also possible RVH.  Some short PR interval.  No significant ST changes except for lateral T wave flattening  Assessment/Plan Principal Problem:   Acute exacerbation of CHF (congestive heart failure) (HCC) Active Problems:   Chronic  obstructive pulmonary disease (HCC)   Essential hypertension   Diabetes mellitus (HCC)   Nondependent alcohol abuse, continuous drinking behavior     #1 acute exacerbation of CHF: Not sure if this is a new diagnosis for the patient because he has not seen a physician in more than 2 years.  Patient will be admitted.  Aggressive diuresis.  Echocardiogram in the morning.  Consider cardiology consultation.  Once the EF is known will determine use of ACEI and other cardiac medications.  #2 malignant hypertension: On arrival.  Currently doing better.  Has been placed on nitroglycerin drip momentarily.  We will initiate oral beta-blockers with diuresis.  Hopefully blood pressure will improve without nitroglycerin drip.  #3 diabetes: Currently appears controlled.  He has been off medications but blood sugar is borderline.  Will monitor him in the hospital.  #4 COPD: Does not appear to have exacerbation.  Continue to monitor.  #5 remote history of alcohol abuse: Patient reported quitting alcohol more than 10 years ago.  I doubt any contribution directly to his current symptoms.   DVT prophylaxis: Lovenox Code Status: Full code Family Communication: No family at bedside Disposition Plan: To be determined Consults called: Consult cardiology in the morning Admission status: Inpatient  Severity of Illness: The appropriate patient status for this patient is INPATIENT. Inpatient status is judged to be reasonable and necessary in order to provide the required intensity of service to ensure the patient's safety. The patient's presenting symptoms, physical exam findings, and initial radiographic and laboratory data in the context of their chronic comorbidities is felt to place them at high risk for further clinical deterioration. Furthermore, it is not anticipated that the patient will be medically stable for discharge from the hospital within 2 midnights of admission. The following factors support the  patient status of inpatient.   " The patient's presenting symptoms include lower extremity edema with shortness of breath. " The worrisome physical exam findings include bilateral lower extremity edema. " The initial radiographic and laboratory data are worrisome because of chest x-ray findings of CHF. " The chronic co-morbidities include hypertension with diabetes.   * I certify that at the point of admission it is my clinical judgment that the patient will require inpatient hospital care spanning beyond 2 midnights from the point of admission due to high intensity of service, high risk for further deterioration and high frequency of surveillance required.Lonia Blood MD [facility] Pager [phone]  If 7PM-7AM, please contact night-coverage [email or web address] Password [email or web address]  08/04/2020, 10:56 PM

## 2020-08-04 NOTE — ED Provider Notes (Signed)
[facility] EMERGENCY DEPARTMENT Provider Note   CSN: [id number] Arrival date & time: 08/04/20  1430     History Chief Complaint  Patient presents with  . Leg Swelling  . Shortness of Breath    Mitchell King is a 70 y.o. male.  HPI   This patient is a 70 year old male, he has a history of COPD and recently noted having increasing shortness of breath which she states was unusual since he usually responds well to his albuterol inhalers.  He has a distant history of hypertension and diabetes but no longer takes medications and last saw his family doctor at the [facility] hospital 2 years ago prior to Covid.  He denies ever having any cardiac symptoms, cardiac problems or diagnoses and has never had any heart attacks arrhythmias or congestive heart failure.  Over the last week he has noted increasing shortness of breath especially with exertion, he has had increasing swelling of his legs which has become quite prominent and his family eventually forced him to come to the hospital to get checked.  On arrival his oxygen was 73% on room air, dyspneic and speaking in shortened sentences.  This has been persistent, severe and getting worse.  No fevers, no chest pain  Past Medical History:  Diagnosis Date  . COPD (chronic obstructive pulmonary disease) (HCC)   . Diabetes mellitus without complication (HCC)   . Hypertension     There are no problems to display for this patient.   History reviewed. No pertinent surgical history.     History reviewed. No pertinent family history.  Social History   Tobacco Use  . Smoking status: Current Every Day Smoker  . Smokeless tobacco: Never Used  Substance Use Topics  . Alcohol use: No    Comment: non x 5 years   . Drug use: No    Home Medications Prior to Admission medications   Medication Sig Start Date End Date Taking? Authorizing Provider  acetaminophen (TYLENOL) 500 MG tablet Take 500-1,000 mg by mouth every 6 (six) hours  as needed for mild pain (or headaches).   Yes [provider]  albuterol (PROVENTIL HFA;VENTOLIN HFA) 108 (90 Base) MCG/ACT inhaler Inhale 2 puffs every 4 (four) hours as needed into the lungs for wheezing or shortness of breath. 05/05/17 06/04/17 Yes Loletta Specter, PA-C  guaifenesin (ROBITUSSIN) 100 MG/5ML syrup Take 100 mg by mouth at bedtime as needed (for chest congestion).   Yes [provider]  Tiotropium Bromide-Olodaterol (STIOLTO RESPIMAT) 2.5-2.5 MCG/ACT AERS Inhale 1 puff into the lungs in the morning and at bedtime.   Yes [provider]  azithromycin (ZITHROMAX) 250 MG tablet Take two tablets on day one then one tablet daily thereafter Patient not taking: Reported on 08/04/2020 05/05/17   Loletta Specter, PA-C  hydrochlorothiazide (HYDRODIURIL) 12.5 MG tablet Take 1 tablet (12.5 mg total) daily by mouth. Patient not taking: Reported on 08/04/2020 05/05/17   Loletta Specter, PA-C  Fluticasone-Salmeterol (ADVAIR DISKUS) 250-50 MCG/DOSE AEPB Inhale 1 puff into the lungs 2 (two) times daily. 11/20/11 10/11/13  Reuben Likes, MD    Allergies    Patient has no known allergies.  Review of Systems   Review of Systems  All other systems reviewed and are negative.   Physical Exam Updated Vital Signs BP (!) 159/108   Pulse 77   Temp 98 F (36.7 C)   Resp 20   SpO2 99%   Physical Exam Vitals and nursing note  reviewed.  Constitutional:      General: He is in acute distress.     Appearance: He is well-developed and well-nourished.  HENT:     Head: Normocephalic and atraumatic.     Nose: Nose normal.     Mouth/Throat:     Mouth: Oropharynx is clear and moist. Mucous membranes are moist.     Pharynx: No oropharyngeal exudate.  Eyes:     General: No scleral icterus.       Right eye: No discharge.        Left eye: No discharge.     Extraocular Movements: EOM normal.     Conjunctiva/sclera: Conjunctivae normal.     Pupils: Pupils are equal,  round, and reactive to light.  Neck:     Thyroid: No thyromegaly.     Vascular: No JVD.     Comments: JVD Cardiovascular:     Rate and Rhythm: Normal rate and regular rhythm.     Pulses: Intact distal pulses.     Heart sounds: Normal heart sounds. No murmur heard. No friction rub. No gallop.   Pulmonary:     Effort: Respiratory distress present.     Breath sounds: Wheezing and rales present.     Comments: The patient is tachypneic but able to speak in full sentences Abdominal:     General: Bowel sounds are normal. There is no distension.     Palpations: Abdomen is soft. There is no mass.     Tenderness: There is no abdominal tenderness.  Musculoskeletal:        General: No tenderness. Normal range of motion.     Cervical back: Normal range of motion and neck supple.     Right lower leg: Edema present.     Left lower leg: Edema present.  Lymphadenopathy:     Cervical: No cervical adenopathy.  Skin:    General: Skin is warm and dry.     Findings: No erythema or rash.  Neurological:     General: No focal deficit present.     Mental Status: He is alert.     Coordination: Coordination normal.  Psychiatric:        Mood and Affect: Mood and affect normal.        Behavior: Behavior normal.     ED Results / Procedures / Treatments   Labs (all labs ordered are listed, but only abnormal results are displayed) Labs Reviewed  BASIC METABOLIC PANEL - Abnormal; Notable for the following components:      Result Value   Glucose, Bld 101 (*)    BUN 27 (*)    Creatinine, Ser 1.25 (*)    Calcium 8.8 (*)    All other components within normal limits  CBC - Abnormal; Notable for the following components:   HCT 54.4 (*)    MCV 102.3 (*)    MCHC 29.8 (*)    All other components within normal limits  BRAIN NATRIURETIC PEPTIDE - Abnormal; Notable for the following components:   B Natriuretic Peptide 690.9 (*)    All other components within normal limits  TROPONIN I (HIGH SENSITIVITY) -  Abnormal; Notable for the following components:   Troponin I (High Sensitivity) 44 (*)    All other components within normal limits  SARS CORONAVIRUS 2 (TAT 6-24 HRS)  PROTIME-INR    EKG EKG Interpretation  Date/Time:  Monday August 04 2020 20:14:27 EST Ventricular Rate:  92 PR Interval:    QRS Duration: 96 QT Interval:  407 QTC Calculation: 473 R Axis:   -89 Text Interpretation: Sinus rhythm Atrial premature complexes Short PR interval Consider right atrial enlargement Left anterior fascicular block Consider RVH w/ secondary repol abnormality Confirmed by Eber Hong [phone]) on 08/04/2020 8:25:00 PM   Radiology DG Chest 2 View  Result Date: 08/04/2020 CLINICAL DATA:  Shortness of breath, leg swelling, decreased O2 sats. EXAM: CHEST - 2 VIEW COMPARISON:  10/11/2013. FINDINGS: Trachea is midline. Heart is enlarged. Thoracic aorta is calcified. Mild basilar predominant interstitial prominence and indistinctness with bibasilar atelectasis and small bilateral pleural effusions. IMPRESSION: Mild congestive heart failure. Electronically Signed   By: Leanna Battles M.D.   On: 08/04/2020 15:03    Procedures .Critical Care Performed by: Eber Hong, MD Authorized by: Eber Hong, MD   Critical care provider statement:    Critical care time (minutes):  35   Critical care time was exclusive of:  Separately billable procedures and treating other patients and teaching time   Critical care was necessary to treat or prevent imminent or life-threatening deterioration of the following conditions:  Cardiac failure   Critical care was time spent personally by me on the following activities:  Blood draw for specimens, development of treatment plan with patient or surrogate, discussions with consultants, evaluation of patient's response to treatment, examination of patient, obtaining history from patient or surrogate, ordering and performing treatments and interventions, ordering and review of  laboratory studies, ordering and review of radiographic studies, pulse oximetry, re-evaluation of patient's condition and review of old charts Comments:           Medications Ordered in ED Medications  nitroGLYCERIN 50 mg in dextrose 5 % 250 mL (0.2 mg/mL) infusion (has no administration in time range)  furosemide (LASIX) injection 40 mg (40 mg Intravenous Given 08/04/20 2008)    ED Course  I have reviewed the triage vital signs and the nursing notes.  Pertinent labs & imaging results that were available during my care of the patient were reviewed by me and considered in my medical decision making (see chart for details).    MDM Rules/Calculators/A&P                          The combination of JVD pulmonary edema, peripheral edema and dyspnea on exertion with hypoxia suggest that this is likely congestive heart failure.  The patient is acutely ill, needs diuresis and a formal diagnosis, labs pending, anticipate admission.  I discussed the case with the on-call cardiologist who recommends this patient can be admitted to the hospital service for further work-up echocardiogram and trending of troponin.  Agrees with diuresis, nitroglycerin, patient is stable, chest pain-free but has ongoing shortness of breath, JVD and peripheral edema.  Labs unremarkable except for a troponin of 44, this will need to be repeated  D/w Dr. Mikeal Hawthorne of the hospitalist who will admit  Final Clinical Impression(s) / ED Diagnoses Final diagnoses:  Acute congestive heart failure, unspecified heart failure type (HCC)  Elevated troponin      Eber Hong, MD 08/04/20 2224

## 2020-08-04 NOTE — ED Notes (Signed)
Pt has significant bilateral lower leg pitting edema and discoloration. Pt is currently on 5 lpm [place] and saturations remain in the 90s at rest. Pt cannot tolerate any level exertion and has had to be placed on 15L NR due temporarily post exertion as his saturation dropped to the 70s and he did not rebound higher than 85% on his own. Currently resting comfortably, condom cath placed.

## 2020-08-04 NOTE — ED Triage Notes (Addendum)
Pt arrived by ems from home for leg swelling x 2 days with sob. Denies hx of chf. Per ems, fire reports low spo2 on arrival but then placed pt on nrb and spo2 in 90s pta. In triage, spo2 70% on 3L  but increased to 90%. Pt is able to speak in full sentences. Moderate swelling noted to legs.

## 2020-08-05 ENCOUNTER — Inpatient Hospital Stay (HOSPITAL_COMMUNITY): Payer: Medicare Other

## 2020-08-05 ENCOUNTER — Encounter (HOSPITAL_COMMUNITY): Payer: Self-pay | Admitting: Internal Medicine

## 2020-08-05 DIAGNOSIS — I5031 Acute diastolic (congestive) heart failure: Secondary | ICD-10-CM

## 2020-08-05 DIAGNOSIS — I50811 Acute right heart failure: Secondary | ICD-10-CM | POA: Diagnosis not present

## 2020-08-05 DIAGNOSIS — I2609 Other pulmonary embolism with acute cor pulmonale: Secondary | ICD-10-CM | POA: Diagnosis not present

## 2020-08-05 DIAGNOSIS — I10 Essential (primary) hypertension: Secondary | ICD-10-CM

## 2020-08-05 DIAGNOSIS — I509 Heart failure, unspecified: Secondary | ICD-10-CM | POA: Diagnosis not present

## 2020-08-05 LAB — CBC
HCT: 54.3 % — ABNORMAL HIGH (ref 39.0–52.0)
Hemoglobin: 16.1 g/dL (ref 13.0–17.0)
MCH: 30.7 pg (ref 26.0–34.0)
MCHC: 29.7 g/dL — ABNORMAL LOW (ref 30.0–36.0)
MCV: 103.6 fL — ABNORMAL HIGH (ref 80.0–100.0)
Platelets: 239 10*3/uL (ref 150–400)
RBC: 5.24 MIL/uL (ref 4.22–5.81)
RDW: 14.4 % (ref 11.5–15.5)
WBC: 4.5 10*3/uL (ref 4.0–10.5)
nRBC: 0 % (ref 0.0–0.2)

## 2020-08-05 LAB — D-DIMER, QUANTITATIVE: D-Dimer, Quant: 2.46 ug/mL-FEU — ABNORMAL HIGH (ref 0.00–0.50)

## 2020-08-05 LAB — BASIC METABOLIC PANEL
Anion gap: 10 (ref 5–15)
BUN: 25 mg/dL — ABNORMAL HIGH (ref 8–23)
CO2: 30 mmol/L (ref 22–32)
Calcium: 9.1 mg/dL (ref 8.9–10.3)
Chloride: 97 mmol/L — ABNORMAL LOW (ref 98–111)
Creatinine, Ser: 1.12 mg/dL (ref 0.61–1.24)
GFR, Estimated: >60 mL/min (ref >60)
Glucose, Bld: 84 mg/dL (ref 70–99)
Potassium: 4.3 mmol/L (ref 3.5–5.1)
Sodium: 137 mmol/L (ref 135–145)

## 2020-08-05 LAB — ECHOCARDIOGRAM COMPLETE
Area-P 1/2: 5.54 cm2
Height: 73 in
S' Lateral: 2.8 cm
Weight: 2888.91 oz

## 2020-08-05 LAB — HIV ANTIBODY (ROUTINE TESTING W REFLEX): HIV Screen 4th Generation wRfx: NONREACTIVE

## 2020-08-05 LAB — SARS CORONAVIRUS 2 (TAT 6-24 HRS): SARS Coronavirus 2: NEGATIVE

## 2020-08-05 MED ORDER — HYDRALAZINE HCL 50 MG PO TABS: 50.0000 mg | ORAL_TABLET | Freq: Three times a day (TID) | ORAL | Status: DC | PRN

## 2020-08-05 MED ORDER — HEPARIN BOLUS VIA INFUSION
5000.0000 [IU] | Freq: Once | INTRAVENOUS | Status: AC
  Administered 2020-08-05: 5000 [IU] via INTRAVENOUS
  Filled 2020-08-05: qty 5000

## 2020-08-05 MED ORDER — LOSARTAN POTASSIUM 50 MG PO TABS
50.0000 mg | ORAL_TABLET | Freq: Every day | ORAL | Status: DC
  Administered 2020-08-05: 50 mg via ORAL
  Filled 2020-08-05: qty 1

## 2020-08-05 MED ORDER — HEPARIN (PORCINE) 25000 UT/250ML-% IV SOLN
1600.0000 [IU]/h | INTRAVENOUS | Status: DC
  Administered 2020-08-05: 1300 [IU]/h via INTRAVENOUS
  Administered 2020-08-06: 1700 [IU]/h via INTRAVENOUS
  Administered 2020-08-06: 1500 [IU]/h via INTRAVENOUS
  Administered 2020-08-08 – 2020-08-11 (×5): 1700 [IU]/h via INTRAVENOUS
  Administered 2020-08-11: 1600 [IU]/h via INTRAVENOUS
  Filled 2020-08-05 (×15): qty 250

## 2020-08-05 MED ORDER — CARVEDILOL 12.5 MG PO TABS
12.5000 mg | ORAL_TABLET | Freq: Two times a day (BID) | ORAL | Status: DC
  Administered 2020-08-05: 12.5 mg via ORAL
  Filled 2020-08-05: qty 4

## 2020-08-05 MED ORDER — POTASSIUM CHLORIDE CRYS ER 10 MEQ PO TBCR
10.0000 meq | EXTENDED_RELEASE_TABLET | Freq: Two times a day (BID) | ORAL | Status: DC
  Administered 2020-08-05 – 2020-08-06 (×3): 10 meq via ORAL
  Filled 2020-08-05 (×3): qty 1

## 2020-08-05 MED ORDER — CARVEDILOL 25 MG PO TABS
25.0000 mg | ORAL_TABLET | Freq: Two times a day (BID) | ORAL | Status: DC
  Administered 2020-08-05 – 2020-08-07 (×5): 25 mg via ORAL
  Filled 2020-08-05: qty 1
  Filled 2020-08-05: qty 2
  Filled 2020-08-05 (×4): qty 1

## 2020-08-05 MED ORDER — IOHEXOL 350 MG/ML SOLN
100.0000 mL | Freq: Once | INTRAVENOUS | Status: AC | PRN
  Administered 2020-08-05: 100 mL via INTRAVENOUS

## 2020-08-05 NOTE — Progress Notes (Signed)
Heart Failure Stewardship Pharmacist Progress Note   PCP: Patient, No Pcp Per PCP-Cardiologist: No primary care provider on file.    HPI:  70 yo M with PMH of T2DM, COPD, HTN, prior alcohol abuse, and medication noncompliance. He presented to the ED On 08/04/20 with progressive shortness of breath, DOE, PND, and orthopnea over the last two weeks. He was admitted for acute CHF. An ECHO was done on 08/05/20 and LVEF is 55-60%.  Current HF Medications: Furosemide 40 mg IV BID Losartan 50 mg daily  Prior to admission HF Medications: None  Pertinent Lab Values: . Serum creatinine 1.12, BUN 25, Potassium 4.3, Sodium 137, BNP 690.9  Vital Signs: . Weight: 180 lbs (admission weight: 180 lbs) . Blood pressure: 140-170/90s  . Heart rate: 80-90s   Medication Assistance / Insurance Benefits Check: Does the patient have prescription insurance?  Yes Type of insurance plan: UHC Medicare  Does the patient qualify for medication assistance through manufacturers or grants?   Pending . Eligible grants and/or patient assistance programs: pending . Medication assistance applications in progress: none  . Medication assistance applications approved: none Approved medication assistance renewals will be completed by: TBD  Outpatient Pharmacy:  Prior to admission outpatient pharmacy: [facility] Is the patient willing to use [facility] TOC pharmacy at discharge? Yes   Assessment: 1. Acute diastolic CHF (EF 50-09%). NYHA class III symptoms. - Continue furosemide 40 mg IV BID - Continue losartan 50 mg daily. Could consider optimizing further to [facility] given newer indication for LVEF <57% - Consider starting spironolactone 25 mg daily (augment diuresis, resistant HTN) - Consider starting Jardiance 10 mg daily prior to discharge (following IV diuresis). [place] Pepper is the SGLT2i approved on the [place] formulary   Plan: 1) Medication changes recommended at this time: - Continue IV  diuresis; consider adding spironolactone 25 mg daily  2) Patient assistance: - [facility] as they would be [facility] for him  3)  Education  - To be completed prior to discharge  Sharen Hones, PharmD, BCPS Heart Failure Stewardship Pharmacist Phone [phone]

## 2020-08-05 NOTE — ED Notes (Signed)
Pt currently eating lunch. Linens changed and condom cath taken off.

## 2020-08-05 NOTE — Progress Notes (Signed)
*  PRELIMINARY RESULTS* Echocardiogram 2D Echocardiogram has been performed.  Mitchell King 08/05/2020, 9:40 AM

## 2020-08-05 NOTE — Progress Notes (Signed)
RT NOTE:  CPAP not put on patient at this time due to confusion. RN setting up bedside pulse ox to monitor patient overnight. RN agreed to contact RT if patient has any apneic episodes of desaturations. Pt currently on 6L [place]. Pt does not have a documented history of sleep apnea or sleep study on file. RT would recommend MD order overnight pulse ox monitoring to put in patient chart. CPAP is at bedside if needed.

## 2020-08-05 NOTE — Progress Notes (Addendum)
[facility] PROGRESS NOTE    Mitchell King  ZOX:[id number] DOB: 08/31/1950 DOA: 08/04/2020 PCP: Patient, No Pcp Per      Brief Narrative:  Mitchell King is a 70 y.o. M with COPD not on home O2, DM and HTN no longer on medication who presented with at least 1 week progressive LE edema and dyspnea on exertion not responsive to home albuterol.  In the ER, he was hypoxic to 70%.  CXR showed bilateral pulmonary edema, bilateral small effusions.  BNP 600.  He was started on Lasix.      Assessment & Plan:  Acute right heart failure   No prior history of ischemic heart disease or CHF.  Now with acute dyspnea on exertion, pleural effusions, paroxysmal nocturnal dyspnea and orthopnea, edema on CXR and rales on exam.  Echo here shows normal LVEF, but marked reduced RV function.   -Furosemide 40 mg IV twice a day  -K supplement -Strict I/Os, daily weights, telemetry  -Daily monitoring renal function -Consult Cardiology for recommendations for lung imaging, RHC for new right heart failure -Start CPAP at night -TOC consult for CPAP at discharge    ADDENDUM: Acute pulmonary embolism CT PE shows several scattered bilateral subsegmental PE.  These are small, but given the number, Radiology and I do not feel these are artifact.   -Start heparin    Hypertensive urgency -Start losartan -Continue Lasix -Wean nitro -Hydralazine PRN  Acute metabolic encephalopathy Patient has some delirium due to CHF, sleep deprivation  Suspected OSA Patient confused but doesn't use CPAP at home.  Here, staff noted SpO2 transiently to 60% while sleeping -CPAP at night  Diabetes Glucose here normal -Obtain A1c -Start SS corrections if A1c up or elevated glucose noted here   COPD No active flare       Disposition: Status is: Inpatient  Remains inpatient appropriate because:Altered mental status, Unsafe d/c plan and IV treatments appropriate due to intensity of illness or  inability to take PO   Dispo: The patient is from: Home              Anticipated d/c is to: SNF              Anticipated d/c date is: 2 days              Patient currently is not medically stable to d/c.   Difficult to place patient No       Level of care: Telemetry Cardiac       MDM: The below labs and imaging reports were reviewed and summarized above.  Medication management as above.  New acute confusion, severe exqacerbation of chronic illness.    DVT prophylaxis: enoxaparin (LOVENOX) injection 40 mg Start: 08/04/20 2315  Code Status: FULL Family Communication: Son by phone    Consultants:     Procedures:   2/15 echocardiogram  Antimicrobials:    None  Culture data:   None           Subjective: The patient is somewhat confused. He has some paroxysmal nocturnal dyspnea. His legs are still swollen. He said no fever, sputum. No vomiting.  Objective: Vitals:   08/05/20 0800 08/05/20 0945 08/05/20 1000 08/05/20 1100  BP: (!) 143/76 (!) 169/94 (!) 174/94 (!) 175/90  Pulse: 87 91 93 87  Resp:  (!) Temp:      TempSrc:      SpO2: 95% 97% 95% 93%  Weight:  Height:        Intake/Output Summary (Last 24 hours) at 08/05/2020 1303 Last data filed at 08/05/2020 1028 Gross per 24 hour  Intake -  Output 4000 ml  Net -4000 ml   Filed Weights   08/05/20 0514  Weight: 81.9 kg    Examination: General appearance: Elderly adult male, awake and in no acute distress. Lying in bed, appears somewhat confused HEENT: Anicteric, conjunctiva pink, lids and lashes normal. No nasal deformity, discharge, epistaxis.  Lips moist oropharynx tacky dry, no oral lesions, hearing normal.   Skin: Warm and dry. No jaundice.  No suspicious rashes or lesions. Cardiac: Tachycardic, regular, nl S1-S2, no murmurs appreciated, apical impulse exaggerated.  Capillary refill is brisk.  JVP elevated. 2+ LE edema.  Radial pulses 2+ and symmetric. Respiratory: Normal  respiratory rate and rhythm.  CTAB without rales or wheezes. Abdomen: Abdomen soft.  no TTP or guarfding. No ascites, distension, hepatosplenomegaly.   MSK: No deformities or effusions. Neuro: Awake and alert, but somewhat confused.  EOMI, moves all extremities with generalized weakness Speech fluent.    Psych: Sensorium intact and responding to questions, attention impaired. Affect blunted.  Judgment and insight appear impaired.    Data Reviewed: I have personally reviewed following labs and imaging studies:  CBC: Recent Labs  Lab 08/04/20 1440 08/05/20 0546  WBC 4.3 4.5  HGB 16.2 16.1  HCT 54.4* 54.3*  MCV 102.3* 103.6*  PLT 247 239   Basic Metabolic Panel: Recent Labs  Lab 08/04/20 1440 08/05/20 0546  NA 135 137  K 4.4 4.3  CL 99 97*  CO2 27 30  GLUCOSE 101* 84  BUN 27* 25*  CREATININE 1.25* 1.12  CALCIUM 8.8* 9.1   GFR: Estimated Creatinine Clearance: 69.4 mL/min (by C-G formula based on SCr of 1.12 mg/dL). Liver Function Tests: No results for input(s): AST, ALT, ALKPHOS, BILITOT, PROT, ALBUMIN in the last 168 hours. No results for input(s): LIPASE, AMYLASE in the last 168 hours. No results for input(s): AMMONIA in the last 168 hours. Coagulation Profile: Recent Labs  Lab 08/04/20 2055  INR 1.1   Cardiac Enzymes: No results for input(s): CKTOTAL, CKMB, CKMBINDEX, TROPONINI in the last 168 hours. BNP (last 3 results) No results for input(s): PROBNP in the last 8760 hours. HbA1C: No results for input(s): HGBA1C in the last 72 hours. CBG: No results for input(s): GLUCAP in the last 168 hours. Lipid Profile: No results for input(s): CHOL, HDL, LDLCALC, TRIG, CHOLHDL, LDLDIRECT in the last 72 hours. Thyroid Function Tests: No results for input(s): TSH, T4TOTAL, FREET4, T3FREE, THYROIDAB in the last 72 hours. Anemia Panel: No results for input(s): VITAMINB12, FOLATE, FERRITIN, TIBC, IRON, RETICCTPCT in the last 72 hours. Urine analysis:    Component Value  Date/Time   BILIRUBINUR neg 04/20/2017 1447   PROTEINUR 100 04/20/2017 1447   UROBILINOGEN 1.0 04/20/2017 1447   NITRITE neg 04/20/2017 1447   LEUKOCYTESUR Negative 04/20/2017 1447   Sepsis Labs: @LABRCNTIP (procalcitonin:4,lacticacidven:4)  ) Recent Results (from the past 240 hour(s))  SARS CORONAVIRUS 2 (TAT 6-24 HRS) Nasopharyngeal Nasopharyngeal Swab     Status: None   Collection Time: 08/05/20  1:03 AM   Specimen: Nasopharyngeal Swab  Result Value Ref Range Status   SARS Coronavirus 2 NEGATIVE NEGATIVE Final    Comment: (NOTE) SARS-CoV-2 target nucleic acids are NOT DETECTED.  The SARS-CoV-2 RNA is generally detectable in upper and lower respiratory specimens during the acute phase of infection. Negative results do not preclude SARS-CoV-2 infection, do  not rule out co-infections with other pathogens, and should not be used as the sole basis for treatment or other patient management decisions. Negative results must be combined with clinical observations, patient history, and epidemiological information. The expected result is Negative.  Fact Sheet for Patients: HairSlick.no  Fact Sheet for Healthcare Providers: quierodirigir.com  This test is not yet approved or cleared by the [place] FDA and  has been authorized for detection and/or diagnosis of SARS-CoV-2 by FDA under an Emergency Use Authorization (EUA). This EUA will remain  in effect (meaning this test can be used) for the duration of the COVID-19 declaration under Se ction 564(b)(1) of the Act, 21 U.S.C. section 360bbb-3(b)(1), unless the authorization is terminated or revoked sooner.  Performed at [facility], [address], [place], [place] [address]          Radiology Studies: DG Chest 2 View  Result Date: 08/04/2020 CLINICAL DATA:  Shortness of breath, leg swelling, decreased O2 sats. EXAM: CHEST - 2 VIEW COMPARISON:  10/11/2013.  FINDINGS: Trachea is midline. Heart is enlarged. Thoracic aorta is calcified. Mild basilar predominant interstitial prominence and indistinctness with bibasilar atelectasis and small bilateral pleural effusions. IMPRESSION: Mild congestive heart failure. Electronically Signed   By: Leanna Battles M.D.   On: 08/04/2020 15:03   ECHOCARDIOGRAM COMPLETE  Result Date: 08/05/2020    ECHOCARDIOGRAM REPORT   Patient Name:   Mitchell King Date of Exam: 08/05/2020 Medical Rec #:  [id number]       Height:       73.0 in Accession #:    [id number]      Weight:       180.6 lb Date of Birth:  1950-12-13        BSA:          2.060 m Patient Age:    70 years        BP:           143/76 mmHg Patient Gender: M               HR:           87 bpm. [facility]am [facility] Procedure: 2D Echo Indications:    CHF-Acute Diastolic I50.31  History:        Patient has prior history of Echocardiogram examinations, most                 recent 07/16/2009. CHF, COPD; Risk Factors:Diabetes, Hypertension                 and Current Smoker. ETOH.  Sonographer:    Jeryl Columbia Referring Phys: [id number] MOHAMMAD L GARBA IMPRESSIONS  1. Left ventricular ejection fraction, by estimation, is 55 to 60%. The left ventricle has normal function. The left ventricle has no regional wall motion abnormalities. There is mild concentric left ventricular hypertrophy. Left ventricular diastolic parameters were normal. There is the interventricular septum is flattened in systole, consistent with right ventricular pressure overload.  2. Right ventricular systolic function is normal. The right ventricular size is severely enlarged. Moderately increased right ventricular wall thickness. There is mildly elevated pulmonary artery systolic pressure. The estimated right ventricular systolic pressure is 44.4 mmHg (suspect this is an underestimation due to the faint TR jet).  3. Right atrial size was severely dilated.  4. The mitral valve is normal in structure. No evidence  of mitral valve regurgitation. No evidence of mitral stenosis.  5. The aortic valve is tricuspid.  Aortic valve regurgitation is not visualized. No aortic stenosis is present.  6. The inferior vena cava is dilated in size with <50% respiratory variability, suggesting right atrial pressure of 15 mmHg. Comparison(s): Prior images unable to be directly viewed, comparison made by report only. FINDINGS  Left Ventricle: Left ventricular ejection fraction, by estimation, is 55 to 60%. The left ventricle has normal function. The left ventricle has no regional wall motion abnormalities. The left ventricular internal cavity size was normal in size. There is  mild concentric left ventricular hypertrophy. The interventricular septum is flattened in systole, consistent with right ventricular pressure overload. Left ventricular diastolic parameters were normal. Indeterminate filling pressures. Right Ventricle: The right ventricular size is severely enlarged. Moderately increased right ventricular wall thickness. Right ventricular systolic function is normal. There is mildly elevated pulmonary artery systolic pressure. The tricuspid regurgitant  velocity is 2.71 m/s, and with an assumed right atrial pressure of 15 mmHg, the estimated right ventricular systolic pressure is 44.4 mmHg. Left Atrium: Left atrial size was normal in size. Right Atrium: Right atrial size was severely dilated. Pericardium: There is no evidence of pericardial effusion. Mitral Valve: The mitral valve is normal in structure. No evidence of mitral valve regurgitation. No evidence of mitral valve stenosis. Tricuspid Valve: The tricuspid valve is normal in structure. Tricuspid valve regurgitation is mild . No evidence of tricuspid stenosis. Aortic Valve: The aortic valve is tricuspid. Aortic valve regurgitation is not visualized. No aortic stenosis is present. Pulmonic Valve: The pulmonic valve was normal in structure. Pulmonic valve regurgitation is trivial. No  evidence of pulmonic stenosis. Aorta: The aortic root is normal in size and structure. Venous: The inferior vena cava is dilated in size with less than 50% respiratory variability, suggesting right atrial pressure of 15 mmHg. IAS/Shunts: There is left bowing of the interatrial septum, suggestive of elevated right atrial pressure. No atrial level shunt detected by color flow Doppler.  LEFT VENTRICLE PLAX 2D LVIDd:         4.20 cm  Diastology LVIDs:         2.80 cm  LV e' medial:    8.05 cm/s LV PW:         1.20 cm  LV E/e' medial:  13.2 LV IVS:        1.40 cm  LV e' lateral:   9.14 cm/s LVOT diam:     2.00 cm  LV E/e' lateral: 11.6 LVOT Area:     3.14 cm  RIGHT VENTRICLE RV S prime:     10.70 cm/s TAPSE (M-mode): 2.0 cm LEFT ATRIUM           Index       RIGHT ATRIUM           Index LA diam:      3.60 cm 1.75 cm/m  RA Area:     18.70 cm LA Vol (A4C): 62.1 ml 30.14 ml/m RA Volume:   53.60 ml  26.02 ml/m   AORTA Ao Root diam: 3.40 cm MITRAL VALVE                TRICUSPID VALVE MV Area (PHT): 5.54 cm     TR Peak grad:   29.4 mmHg MV Decel Time: 137 msec     TR Vmax:        271.00 cm/s MV E velocity: 106.00 cm/s MV A velocity: 84.80 cm/s   SHUNTS MV E/A ratio:  1.25         Systemic  Diam: 2.00 cm Thurmon Fair MD Electronically signed by Thurmon Fair MD Signature Date/Time: 08/05/2020/10:25:53 AM    Final         Scheduled Meds: . enoxaparin (LOVENOX) injection  40 mg Subcutaneous QHS  . furosemide  40 mg Intravenous BID  . losartan  50 mg Oral Daily  . potassium chloride  10 mEq Oral BID  . sodium chloride flush  3 mL Intravenous Q12H   Continuous Infusions: . sodium chloride       LOS: 1 day    Time spent: 35 minutes    Alberteen Sam, MD [facility] 08/05/2020, 1:03 PM     Please page though AMION or Epic secure chat:  For [employer], Higher education careers adviser

## 2020-08-05 NOTE — Consult Note (Signed)
Cardiology Consultation:  Patient ID: Mitchell King MRN: [id number]; DOB: Feb 12, 1951  Admit date: 08/04/2020 Date of Consult: 08/05/2020  Primary Care Provider: Patient, No Pcp Per Primary Cardiologist: No primary care provider on file.   Patient Profile:  Mitchell King is a 70 y.o. male with a hx of diabetes, hypertension, COPD who is being seen today for the evaluation of congestive heart failure at the request of Cecilie Lowers, MD.  History of Present Illness:  [name] presents with 2 weeks of worsening shortness of breath and lower extremity edema.  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  Apparently 2 weeks ago he experienced sudden onset of shortness of breath.  This led to worsening lower extremity edema.  He then presented to the emergency room where he is found to have saturations of 73% on room air.  BP was elevated.  Lab work demonstrated elevated BNP and minimally elevated troponin.  Chest x-ray concerning for mild interstitial edema.  Of note his mild edema does not explain saturations in the 70s.  His echocardiogram demonstrates cor pulmonale.  His RV is severely dilated with moderately reduced function.  No history of home oxygen.  No history of pulmonary hypertension.  He reports a family history of hypertension.  He is a smoker of nearly 50 years for a pack a day.  He also used to drink heavily.  He reports he has been in and out of treatments for both tobacco and alcohol.  No drug use reported.  No history of autoimmune disorders.  Heart Pathway Score:       Past Medical History: Past Medical History:  Diagnosis Date  . CHF (congestive heart failure) (HCC)   . COPD (chronic obstructive pulmonary disease) (HCC)   . Diabetes mellitus without complication (HCC)   . Dyspnea   . Hypertension     Past Surgical History: Past Surgical History:  Procedure Laterality Date  . NO PAST SURGERIES       Home Medications:  Prior to Admission medications   Medication Sig Start  Date End Date Taking? Authorizing Provider  acetaminophen (TYLENOL) 500 MG tablet Take 500-1,000 mg by mouth every 6 (six) hours as needed for mild pain (or headaches).   Yes [provider]  albuterol (PROVENTIL HFA;VENTOLIN HFA) 108 (90 Base) MCG/ACT inhaler Inhale 2 puffs every 4 (four) hours as needed into the lungs for wheezing or shortness of breath. 05/05/17 06/04/17 Yes Loletta Specter, PA-C  guaifenesin (ROBITUSSIN) 100 MG/5ML syrup Take 100 mg by mouth at bedtime as needed (for chest congestion).   Yes [provider]  Tiotropium Bromide-Olodaterol (STIOLTO RESPIMAT) 2.5-2.5 MCG/ACT AERS Inhale 1 puff into the lungs in the morning and at bedtime.   Yes [provider]  azithromycin (ZITHROMAX) 250 MG tablet Take two tablets on day one then one tablet daily thereafter Patient not taking: Reported on 08/04/2020 05/05/17   Loletta Specter, PA-C  hydrochlorothiazide (HYDRODIURIL) 12.5 MG tablet Take 1 tablet (12.5 mg total) daily by mouth. Patient not taking: Reported on 08/04/2020 05/05/17   Loletta Specter, PA-C  Fluticasone-Salmeterol (ADVAIR DISKUS) 250-50 MCG/DOSE AEPB Inhale 1 puff into the lungs 2 (two) times daily. 11/20/11 10/11/13  Reuben Likes, MD    Inpatient Medications: Scheduled Meds: . carvedilol  12.5 mg Oral BID WC  . enoxaparin (LOVENOX) injection  40 mg Subcutaneous QHS  . furosemide  40 mg Intravenous BID  . losartan  50 mg Oral Daily  . potassium chloride  10 mEq Oral BID  . sodium chloride flush  3 mL Intravenous Q12H   Continuous Infusions: . sodium chloride     PRN Meds: sodium chloride, acetaminophen, albuterol, guaifenesin, hydrALAZINE, ondansetron (ZOFRAN) IV, sodium chloride flush  Allergies:    No Known Allergies  Social History:   Social History   Socioeconomic History  . Marital status: Divorced    Spouse name: Not on file  . Number of children: Not on file  . Years of education: Not on file  . Highest  education level: Not on file  Occupational History  . Not on file  Tobacco Use  . Smoking status: Current Every Day Smoker    Packs/day: 1.50    Years: 40.00    Pack years: 60.00    Types: Cigarettes  . Smokeless tobacco: Never Used  Vaping Use  . Vaping Use: Never used  Substance and Sexual Activity  . Alcohol use: No    Comment: non x 5 years   . Drug use: No  . Sexual activity: Not on file  Other Topics Concern  . Not on file  Social History Narrative  . Not on file   Social Determinants of Health   Financial Resource Strain: Not on file  Food Insecurity: Not on file  Transportation Needs: Not on file  Physical Activity: Not on file  Stress: Not on file  Social Connections: Not on file  Intimate Partner Violence: Not on file     Family History:   History reviewed. No pertinent family history.   ROS:  All other ROS reviewed and negative. Pertinent positives noted in the HPI.     Physical Exam/Data:   Vitals:   08/05/20 1100 08/05/20 1230 08/05/20 1445 08/05/20 1638  BP: (!) 175/90 (!) 165/92 138/84 (!) 173/92  Pulse: 87 82 79 76  Resp: 20 13 18 18   Temp:  98.8 F (37.1 C) 98.6 F (37 C) 99 F (37.2 C)  TempSrc:  Oral Oral Oral  SpO2: 93% 92% 94% 93%  Weight:      Height:         Intake/Output Summary (Last 24 hours) at 08/05/2020 1753 Last data filed at 08/05/2020 1655 Gross per 24 hour  Intake 240 ml  Output 5500 ml  Net -5260 ml    Last 3 Weights 08/05/2020 05/05/2017 04/20/2017  Weight (lbs) 180 lb 8.9 oz 180 lb 9.6 oz 179 lb 5 oz  Weight (kg) 81.9 kg 81.92 kg 81.336 kg    Body mass index is 23.82 kg/m.   General: Ill-appearing Head: Atraumatic, normal size  Eyes: PEERLA, EOMI  Neck: Supple, JVD up to earlobes positive HJR Endocrine: No thryomegaly Cardiac: Normal S1, S2; RRR; no murmurs, rubs, or gallops Lungs: Diminished breath sounds bilaterally Abd: Soft, nontender, no hepatomegaly  Ext: 2+ pitting edema up to thighs Musculoskeletal:  No deformities, BUE and BLE strength normal and equal Skin: Warm and dry, no rashes   Neuro: Alert and oriented to person, place, time, and situation, CNII-XII grossly intact, no focal deficits  Psych: Normal mood and affect   EKG:  The EKG was personally reviewed and demonstrates: Sinus rhythm, right bundle branch block, PACs Telemetry:  Telemetry was personally reviewed and demonstrates: Sinus rhythm with heart rate in the 80s  Relevant CV Studies: TTE 08/05/2020 1. Left ventricular ejection fraction, by estimation, is 55 to 60%. The  left ventricle has normal function. The left ventricle has no regional  wall motion abnormalities. There is mild concentric  left ventricular  hypertrophy. Left ventricular diastolic  parameters were normal. There is the interventricular septum is flattened  in systole, consistent with right ventricular pressure overload.  2. Right ventricular systolic function is normal. The right ventricular  size is severely enlarged. Moderately increased right ventricular wall  thickness. There is mildly elevated pulmonary artery systolic pressure.  The estimated right ventricular  systolic pressure is 44.4 mmHg (suspect this is an underestimation due to  the faint TR jet).  3. Right atrial size was severely dilated.  4. The mitral valve is normal in structure. No evidence of mitral valve  regurgitation. No evidence of mitral stenosis.  5. The aortic valve is tricuspid. Aortic valve regurgitation is not  visualized. No aortic stenosis is present.  6. The inferior vena cava is dilated in size with <50% respiratory  variability, suggesting right atrial pressure of 15 mmHg.   Laboratory Data: High Sensitivity Troponin:   Recent Labs  Lab 08/04/20 2003  TROPONINIHS 44*     Cardiac EnzymesNo results for input(s): TROPONINI in the last 168 hours. No results for input(s): TROPIPOC in the last 168 hours.  Chemistry Recent Labs  Lab 08/04/20 1440 08/05/20 0546   NA 135 137  K 4.4 4.3  CL 99 97*  CO2 27 30  GLUCOSE 101* 84  BUN 27* 25*  CREATININE 1.25* 1.12  CALCIUM 8.8* 9.1  GFRNONAA >60 >60  ANIONGAP 9 10    No results for input(s): PROT, ALBUMIN, AST, ALT, ALKPHOS, BILITOT in the last 168 hours. Hematology Recent Labs  Lab 08/04/20 1440 08/05/20 0546  WBC 4.3 4.5  RBC 5.32 5.24  HGB 16.2 16.1  HCT 54.4* 54.3*  MCV 102.3* 103.6*  MCH 30.5 30.7  MCHC 29.8* 29.7*  RDW 14.5 14.4  PLT 247 239   BNP Recent Labs  Lab 08/04/20 2003  BNP 690.9*    DDimer No results for input(s): DDIMER in the last 168 hours.  Radiology/Studies:  DG Chest 2 View  Result Date: 08/04/2020 CLINICAL DATA:  Shortness of breath, leg swelling, decreased O2 sats. EXAM: CHEST - 2 VIEW COMPARISON:  10/11/2013. FINDINGS: Trachea is midline. Heart is enlarged. Thoracic aorta is calcified. Mild basilar predominant interstitial prominence and indistinctness with bibasilar atelectasis and small bilateral pleural effusions. IMPRESSION: Mild congestive heart failure. Electronically Signed   By: Leanna BattlesMelinda  Blietz M.D.   On: 08/04/2020 15:03   ECHOCARDIOGRAM COMPLETE  Result Date: 08/05/2020    ECHOCARDIOGRAM REPORT   Patient Name:   Elder NegusJERRY W Caton Date of Exam: 08/05/2020 Medical Rec #:  [id number]       Height:       73.0 in Accession #:    [id number]      Weight:       180.6 lb Date of Birth:  12/30/1950        BSA:          2.060 m Patient Age:    70 years        BP:           143/76 mmHg Patient Gender: M               HR:           87 bpm. [facility] Procedure: 2D Echo Indications:    CHF-Acute Diastolic I50.31  History:        Patient has prior history of Echocardiogram examinations, most  recent 07/16/2009. CHF, COPD; Risk Factors:Diabetes, Hypertension                 and Current Smoker. ETOH.  Sonographer:    Jeryl Columbia Referring Phys: [id number] MOHAMMAD L GARBA IMPRESSIONS  1. Left ventricular ejection fraction, by estimation, is 55 to  60%. The left ventricle has normal function. The left ventricle has no regional wall motion abnormalities. There is mild concentric left ventricular hypertrophy. Left ventricular diastolic parameters were normal. There is the interventricular septum is flattened in systole, consistent with right ventricular pressure overload.  2. Right ventricular systolic function is normal. The right ventricular size is severely enlarged. Moderately increased right ventricular wall thickness. There is mildly elevated pulmonary artery systolic pressure. The estimated right ventricular systolic pressure is 44.4 mmHg (suspect this is an underestimation due to the faint TR jet).  3. Right atrial size was severely dilated.  4. The mitral valve is normal in structure. No evidence of mitral valve regurgitation. No evidence of mitral stenosis.  5. The aortic valve is tricuspid. Aortic valve regurgitation is not visualized. No aortic stenosis is present.  6. The inferior vena cava is dilated in size with <50% respiratory variability, suggesting right atrial pressure of 15 mmHg. Comparison(s): Prior images unable to be directly viewed, comparison made by report only. FINDINGS  Left Ventricle: Left ventricular ejection fraction, by estimation, is 55 to 60%. The left ventricle has normal function. The left ventricle has no regional wall motion abnormalities. The left ventricular internal cavity size was normal in size. There is  mild concentric left ventricular hypertrophy. The interventricular septum is flattened in systole, consistent with right ventricular pressure overload. Left ventricular diastolic parameters were normal. Indeterminate filling pressures. Right Ventricle: The right ventricular size is severely enlarged. Moderately increased right ventricular wall thickness. Right ventricular systolic function is normal. There is mildly elevated pulmonary artery systolic pressure. The tricuspid regurgitant  velocity is 2.71 m/s, and with  an assumed right atrial pressure of 15 mmHg, the estimated right ventricular systolic pressure is 44.4 mmHg. Left Atrium: Left atrial size was normal in size. Right Atrium: Right atrial size was severely dilated. Pericardium: There is no evidence of pericardial effusion. Mitral Valve: The mitral valve is normal in structure. No evidence of mitral valve regurgitation. No evidence of mitral valve stenosis. Tricuspid Valve: The tricuspid valve is normal in structure. Tricuspid valve regurgitation is mild . No evidence of tricuspid stenosis. Aortic Valve: The aortic valve is tricuspid. Aortic valve regurgitation is not visualized. No aortic stenosis is present. Pulmonic Valve: The pulmonic valve was normal in structure. Pulmonic valve regurgitation is trivial. No evidence of pulmonic stenosis. Aorta: The aortic root is normal in size and structure. Venous: The inferior vena cava is dilated in size with less than 50% respiratory variability, suggesting right atrial pressure of 15 mmHg. IAS/Shunts: There is left bowing of the interatrial septum, suggestive of elevated right atrial pressure. No atrial level shunt detected by color flow Doppler.  LEFT VENTRICLE PLAX 2D LVIDd:         4.20 cm  Diastology LVIDs:         2.80 cm  LV e' medial:    8.05 cm/s LV PW:         1.20 cm  LV E/e' medial:  13.2 LV IVS:        1.40 cm  LV e' lateral:   9.14 cm/s LVOT diam:     2.00 cm  LV E/e' lateral: 11.6 LVOT  Area:     3.14 cm  RIGHT VENTRICLE RV S prime:     10.70 cm/s TAPSE (M-mode): 2.0 cm LEFT ATRIUM           Index       RIGHT ATRIUM           Index LA diam:      3.60 cm 1.75 cm/m  RA Area:     18.70 cm LA Vol (A4C): 62.1 ml 30.14 ml/m RA Volume:   53.60 ml  26.02 ml/m   AORTA Ao Root diam: 3.40 cm MITRAL VALVE                TRICUSPID VALVE MV Area (PHT): 5.54 cm     TR Peak grad:   29.4 mmHg MV Decel Time: 137 msec     TR Vmax:        271.00 cm/s MV E velocity: 106.00 cm/s MV A velocity: 84.80 cm/s   SHUNTS MV E/A ratio:   1.25         Systemic Diam: 2.00 cm Rachelle Hora Croitoru MD Electronically signed by Thurmon Fair MD Signature Date/Time: 08/05/2020/10:25:53 AM    Final     Assessment and Plan:   1. Right Heart Failure/Cor Pulmonale  -He describes an abrupt sudden onset of shortness of breath roughly 2 weeks ago.  He then developed swelling in his legs.  Chest x-ray on admission with really mild interstitial markings and nothing to explain saturations in the 70s. -Echocardiogram now demonstrates a severely dilated right ventricle with moderately reduced function.  He has evidence of gross volume overload related to right heart failure. -BNP elevated and troponins minimally elevated. -Overall picture highly concerning for an acute pulmonary embolism.  I recommended a stat CT PE study. -He shortness of breath is not explained by his chest x-ray and he has minimal lung findings to suggest gross volume overload.  His left heart also has normal function. -We recommend he check a TSH and A1c.  If CT PE study is negative we will work him up for pulmonary hypertension which will include autoimmune disorders, ILD, VQ scan for chronic PE, CAD, etc. -For now we will continue 40 mg IV Lasix twice daily.  He had nearly 3.9 L of urine output over the last 24 hours and is diuresing well.  He has a long way to go.  2.  Hypertension -Increase Coreg to 25 twice a day.  We will continue to titrate his home antihypertensive agents.  For questions or updates, please contact [facility] Please consult [email or web address] for contact info under   Signed, Gerri Spore T. Flora Lipps, MD [facility]  [facility]  08/05/2020 5:53 PM

## 2020-08-05 NOTE — ED Notes (Signed)
Per MD pt okay to sit on side of bed and eat and use urinal.

## 2020-08-05 NOTE — ED Notes (Signed)
Pt appears to be confused. Keeps trying to get out of bed and use restroom. Redirected and told he has a condom catheter on. Pt A&OX2 currently. MD notified.

## 2020-08-05 NOTE — Progress Notes (Signed)
ANTICOAGULATION CONSULT NOTE - Follow Up Consult  Pharmacy Consult for Heparin Indication: pulmonary embolus  No Known Allergies  Patient Measurements: Height: 6\' 1"  (185.4 cm) Weight: 81.9 kg (180 lb 8.9 oz) IBW/kg (Calculated) : 79.9  Vital Signs: Temp: 99 F (37.2 C) (02/15 1638) Temp Source: Oral (02/15 1638) BP: 173/92 (02/15 1638) Pulse Rate: 76 (02/15 1638)  Labs: Recent Labs    08/04/20 1440 08/04/20 2003 08/04/20 2055 08/05/20 0546  HGB 16.2  --   --  16.1  HCT 54.4*  --   --  54.3*  PLT 247  --   --  239  LABPROT  --   --  14.0  --   INR  --   --  1.1  --   CREATININE 1.25*  --   --  1.12  TROPONINIHS  --  44*  --   --     Estimated Creatinine Clearance: 69.4 mL/min (by C-G formula based on SCr of 1.12 mg/dL).  Assessment: 70 year old male to begin heparin for several scattered small PEs  Goal of Therapy:  Heparin level 0.3-0.7 units/ml Monitor platelets by anticoagulation protocol: Yes   Plan:  Heparin 5000 units iv bolus x 1 Heparin drip at 1300 units / hr Heparin level and CBC in 6 hours Daily heparin level, CBC  Thank you 66, PharmD  08/05/2020,8:18 PM

## 2020-08-06 ENCOUNTER — Inpatient Hospital Stay (HOSPITAL_COMMUNITY): Payer: Medicare Other

## 2020-08-06 DIAGNOSIS — I2609 Other pulmonary embolism with acute cor pulmonale: Secondary | ICD-10-CM

## 2020-08-06 DIAGNOSIS — R0602 Shortness of breath: Secondary | ICD-10-CM | POA: Diagnosis not present

## 2020-08-06 DIAGNOSIS — I10 Essential (primary) hypertension: Secondary | ICD-10-CM | POA: Diagnosis not present

## 2020-08-06 DIAGNOSIS — Z86711 Personal history of pulmonary embolism: Secondary | ICD-10-CM

## 2020-08-06 DIAGNOSIS — M7989 Other specified soft tissue disorders: Secondary | ICD-10-CM | POA: Diagnosis not present

## 2020-08-06 DIAGNOSIS — I50810 Right heart failure, unspecified: Secondary | ICD-10-CM

## 2020-08-06 DIAGNOSIS — I50811 Acute right heart failure: Secondary | ICD-10-CM | POA: Diagnosis not present

## 2020-08-06 DIAGNOSIS — I2699 Other pulmonary embolism without acute cor pulmonale: Secondary | ICD-10-CM | POA: Diagnosis not present

## 2020-08-06 DIAGNOSIS — J449 Chronic obstructive pulmonary disease, unspecified: Secondary | ICD-10-CM | POA: Diagnosis not present

## 2020-08-06 DIAGNOSIS — I509 Heart failure, unspecified: Secondary | ICD-10-CM | POA: Diagnosis not present

## 2020-08-06 DIAGNOSIS — I2601 Septic pulmonary embolism with acute cor pulmonale: Secondary | ICD-10-CM | POA: Diagnosis not present

## 2020-08-06 LAB — TSH: TSH: 0.858 u[IU]/mL (ref 0.350–4.500)

## 2020-08-06 LAB — COMPREHENSIVE METABOLIC PANEL
ALT: 15 U/L (ref 0–44)
AST: 30 U/L (ref 15–41)
Albumin: 3.1 g/dL — ABNORMAL LOW (ref 3.5–5.0)
Alkaline Phosphatase: 66 U/L (ref 38–126)
Anion gap: 10 (ref 5–15)
BUN: 23 mg/dL (ref 8–23)
CO2: 34 mmol/L — ABNORMAL HIGH (ref 22–32)
Calcium: 9.2 mg/dL (ref 8.9–10.3)
Chloride: 93 mmol/L — ABNORMAL LOW (ref 98–111)
Creatinine, Ser: 0.94 mg/dL (ref 0.61–1.24)
GFR, Estimated: >60 mL/min (ref >60)
Glucose, Bld: 113 mg/dL — ABNORMAL HIGH (ref 70–99)
Potassium: 4.7 mmol/L (ref 3.5–5.1)
Sodium: 137 mmol/L (ref 135–145)
Total Bilirubin: 2.1 mg/dL — ABNORMAL HIGH (ref 0.3–1.2)
Total Protein: 7.8 g/dL (ref 6.5–8.1)

## 2020-08-06 LAB — CBC
HCT: 54.3 % — ABNORMAL HIGH (ref 39.0–52.0)
Hemoglobin: 16.4 g/dL (ref 13.0–17.0)
MCH: 30.5 pg (ref 26.0–34.0)
MCHC: 30.2 g/dL (ref 30.0–36.0)
MCV: 101.1 fL — ABNORMAL HIGH (ref 80.0–100.0)
Platelets: 222 10*3/uL (ref 150–400)
RBC: 5.37 MIL/uL (ref 4.22–5.81)
RDW: 14.2 % (ref 11.5–15.5)
WBC: 4.4 10*3/uL (ref 4.0–10.5)
nRBC: 0 % (ref 0.0–0.2)

## 2020-08-06 LAB — FOLATE: Folate: 7.8 ng/mL (ref >5.9)

## 2020-08-06 LAB — HEPARIN LEVEL (UNFRACTIONATED)
Heparin Unfractionated: <0.1 IU/mL — ABNORMAL LOW (ref 0.30–0.70)
Heparin Unfractionated: 0.14 IU/mL — ABNORMAL LOW (ref 0.30–0.70)
Heparin Unfractionated: 0.44 IU/mL (ref 0.30–0.70)

## 2020-08-06 LAB — VITAMIN B12: Vitamin B-12: 432 pg/mL (ref 180–914)

## 2020-08-06 LAB — HEMOGLOBIN A1C
Hgb A1c MFr Bld: 6.2 % — ABNORMAL HIGH (ref 4.8–5.6)
Mean Plasma Glucose: 131 mg/dL

## 2020-08-06 MED ORDER — HEPARIN BOLUS VIA INFUSION
2000.0000 [IU] | Freq: Once | INTRAVENOUS | Status: AC
  Administered 2020-08-06: 2000 [IU] via INTRAVENOUS
  Filled 2020-08-06: qty 2000

## 2020-08-06 MED ORDER — LOSARTAN POTASSIUM 50 MG PO TABS
100.0000 mg | ORAL_TABLET | Freq: Every day | ORAL | Status: DC
  Administered 2020-08-06 – 2020-08-07 (×2): 100 mg via ORAL
  Filled 2020-08-06 (×4): qty 2

## 2020-08-06 MED ORDER — HEPARIN BOLUS VIA INFUSION
3000.0000 [IU] | Freq: Once | INTRAVENOUS | Status: AC
  Administered 2020-08-06: 3000 [IU] via INTRAVENOUS
  Filled 2020-08-06: qty 3000

## 2020-08-06 MED ORDER — ACETAZOLAMIDE ER 500 MG PO CP12
500.0000 mg | ORAL_CAPSULE | Freq: Two times a day (BID) | ORAL | Status: AC
  Administered 2020-08-06 (×2): 500 mg via ORAL
  Filled 2020-08-06 (×3): qty 1

## 2020-08-06 MED ORDER — FUROSEMIDE 10 MG/ML IJ SOLN
80.0000 mg | Freq: Two times a day (BID) | INTRAMUSCULAR | Status: DC
  Administered 2020-08-07: 80 mg via INTRAVENOUS
  Filled 2020-08-06: qty 8

## 2020-08-06 NOTE — Plan of Care (Signed)
  Problem: Education: Goal: Ability to demonstrate management of disease process will improve Outcome: Progressing Goal: Ability to verbalize understanding of medication therapies will improve Outcome: Progressing   Problem: Activity: Goal: Capacity to carry out activities will improve Outcome: Progressing   Problem: Cardiac: Goal: Ability to achieve and maintain adequate cardiopulmonary perfusion will improve Outcome: Progressing   

## 2020-08-06 NOTE — Progress Notes (Signed)
ANTICOAGULATION CONSULT NOTE - Follow Up Consult  Pharmacy Consult for Heparin Indication: pulmonary embolus  No Known Allergies  Patient Measurements: Height: 6\' 1"  (185.4 cm) Weight: 81.2 kg (179 lb 0.2 oz) IBW/kg (Calculated) : 79.9  Vital Signs: Temp: 98.7 F (37.1 C) (02/16 0322) Temp Source: Oral (02/16 0322) BP: 138/82 (02/16 0322) Pulse Rate: 70 (02/16 0322)  Labs: Recent Labs    08/04/20 1440 08/04/20 2003 08/04/20 2055 08/05/20 0546 08/06/20 0353  HGB 16.2  --   --  16.1 16.4  HCT 54.4*  --   --  54.3* 54.3*  PLT 247  --   --  239 222  LABPROT  --   --  14.0  --   --   INR  --   --  1.1  --   --   HEPARINUNFRC  --   --   --   --  <0.10*  CREATININE 1.25*  --   --  1.12 0.94  TROPONINIHS  --  44*  --   --   --     Estimated Creatinine Clearance: 82.6 mL/min (by C-G formula based on SCr of 0.94 mg/dL).  Assessment: 70 year old male to begin heparin for several scattered small PE's. CBC good. Initial heparin level is low. No issues per RN.   Goal of Therapy:  Heparin level 0.3-0.7 units/ml Monitor platelets by anticoagulation protocol: Yes   Plan:  Heparin 3000 units IV bolus x 1 Inc Heparin drip to 1500 units / hr Heparin level in 8 hours Daily heparin level, CBC  66, PharmD, BCPS Clinical Pharmacist Phone: [phone]

## 2020-08-06 NOTE — Progress Notes (Signed)
ANTICOAGULATION CONSULT NOTE - Follow Up Consult  Pharmacy Consult for Heparin Indication: pulmonary embolus  No Known Allergies  Patient Measurements: Height: 6\' 1"  (185.4 cm) Weight: 81.2 kg (179 lb 0.2 oz) IBW/kg (Calculated) : 79.9  Vital Signs: Temp: 98.7 F (37.1 C) (02/16 1206) Temp Source: Oral (02/16 1206) BP: 118/77 (02/16 1206) Pulse Rate: 70 (02/16 1206)  Labs: Recent Labs    08/04/20 1440 08/04/20 2003 08/04/20 2055 08/05/20 0546 08/06/20 0353 08/06/20 1350  HGB 16.2  --   --  16.1 16.4  --   HCT 54.4*  --   --  54.3* 54.3*  --   PLT 247  --   --  239 222  --   LABPROT  --   --  14.0  --   --   --   INR  --   --  1.1  --   --   --   HEPARINUNFRC  --   --   --   --  <0.10* 0.14*  CREATININE 1.25*  --   --  1.12 0.94  --   TROPONINIHS  --  44*  --   --   --   --     Estimated Creatinine Clearance: 82.6 mL/min (by C-G formula based on SCr of 0.94 mg/dL).  Assessment: 70 year old male to begin heparin for several scattered small PE's. CBC good.   Heparin level came back subtherapeutic at 0.14, on 1500 units/hr. Hgb 16.4, plt 222. No s/sx of bleeding or infusion issues.  Goal of Therapy:  Heparin level 0.3-0.7 units/ml Monitor platelets by anticoagulation protocol: Yes   Plan:  Heparin 2000 units IV bolus x 1 Increase heparin drip to 1700 units / hr Heparin level in 8 hours Daily heparin level, CBC  66, PharmD, BCCCP Clinical Pharmacist  Phone: [phone] 08/06/2020 3:16 PM  Please check AMION for all [facility] Pharmacy phone numbers After 10:00 PM, call Main Pharmacy [phone]

## 2020-08-06 NOTE — Progress Notes (Signed)
   08/06/20 1115  Mobility  Activity Refused mobility (Declined was asleep upon entering room)

## 2020-08-06 NOTE — Plan of Care (Signed)
  Problem: Activity: Goal: Capacity to carry out activities will improve Outcome: Progressing   Problem: Cardiac: Goal: Ability to achieve and maintain adequate cardiopulmonary perfusion will improve Outcome: Progressing   

## 2020-08-06 NOTE — Progress Notes (Signed)
Cardiology Progress Note  Patient ID: Mitchell NegusJerry W King MRN: [id number] DOB: 02/21/1951 Date of Encounter: 08/06/2020  Primary Cardiologist: No primary care provider on file.  Subjective   Chief Complaint: Shortness of breath  HPI: Found to have multiple pulmonary emboli which likely explain his right heart failure.  Good urine output.  Reports he is breathing better but still not back to baseline.  ROS:  All other ROS reviewed and negative. Pertinent positives noted in the HPI.     Inpatient Medications  Scheduled Meds: . acetaZOLAMIDE  500 mg Oral Q12H  . carvedilol  25 mg Oral BID WC  . furosemide  40 mg Intravenous BID  . losartan  50 mg Oral Daily  . potassium chloride  10 mEq Oral BID  . sodium chloride flush  3 mL Intravenous Q12H   Continuous Infusions: . sodium chloride    . heparin 1,500 Units/hr (08/06/20 0540)   PRN Meds: sodium chloride, acetaminophen, albuterol, guaifenesin, hydrALAZINE, ondansetron (ZOFRAN) IV, sodium chloride flush   Vital Signs   Vitals:   08/05/20 1638 08/05/20 2051 08/06/20 0322 08/06/20 0755  BP: (!) 173/92 (!) 180/106 138/82 (!) 143/92  Pulse: 76 79 70 71  Resp: 18 20 20 20   Temp: 99 F (37.2 C) 99.2 F (37.3 C) 98.7 F (37.1 C) 98.4 F (36.9 C)  TempSrc: Oral Oral Oral Oral  SpO2: 93% 95% 92% 94%  Weight:   81.2 kg   Height:        Intake/Output Summary (Last 24 hours) at 08/06/2020 0824 Last data filed at 08/06/2020 0814 Gross per 24 hour  Intake 566.06 ml  Output 5100 ml  Net -4533.94 ml   Last 3 Weights 08/06/2020 08/05/2020 05/05/2017  Weight (lbs) 179 lb 0.2 oz 180 lb 8.9 oz 180 lb 9.6 oz  Weight (kg) 81.2 kg 81.9 kg 81.92 kg      Telemetry  Overnight telemetry shows sinus rhythm heart rate in the 70s, which I personally reviewed.   ECG  The most recent ECG shows sinus rhythm heart rate 92 with PACs, incomplete right bundle branch block, which I personally reviewed.   Physical Exam   Vitals:   08/05/20 1638  08/05/20 2051 08/06/20 0322 08/06/20 0755  BP: (!) 173/92 (!) 180/106 138/82 (!) 143/92  Pulse: 76 79 70 71  Resp: 18 20 20 20   Temp: 99 F (37.2 C) 99.2 F (37.3 C) 98.7 F (37.1 C) 98.4 F (36.9 C)  TempSrc: Oral Oral Oral Oral  SpO2: 93% 95% 92% 94%  Weight:   81.2 kg   Height:         Intake/Output Summary (Last 24 hours) at 08/06/2020 0824 Last data filed at 08/06/2020 [id number] Gross per 24 hour  Intake 566.06 ml  Output 5100 ml  Net -4533.94 ml    Last 3 Weights 08/06/2020 08/05/2020 05/05/2017  Weight (lbs) 179 lb 0.2 oz 180 lb 8.9 oz 180 lb 9.6 oz  Weight (kg) 81.2 kg 81.9 kg 81.92 kg    Body mass index is 23.62 kg/m.  General: Well nourished, well developed, in no acute distress Head: Atraumatic, normal size  Eyes: PEERLA, EOMI  Neck: Supple, JVD 15 to 17 cm of water Endocrine: No thryomegaly Cardiac: Normal S1, S2; RRR; no murmurs, rubs, or gallops Lungs: Diminished breath sounds bilaterally Abd: Soft, nontender, no hepatomegaly  Ext: 2+ pitting edema Musculoskeletal: No deformities, BUE and BLE strength normal and equal Skin: Warm and dry, no rashes   Neuro: Alert and  oriented to person, place, time, and situation, CNII-XII grossly intact, no focal deficits  Psych: Normal mood and affect   Labs  High Sensitivity Troponin:   Recent Labs  Lab 08/04/20 2003  TROPONINIHS 44*     Cardiac EnzymesNo results for input(s): TROPONINI in the last 168 hours. No results for input(s): TROPIPOC in the last 168 hours.  Chemistry Recent Labs  Lab 08/04/20 1440 08/05/20 0546 08/06/20 0353  NA 135 137 137  K 4.4 4.3 4.7  CL 99 97* 93*  CO2 27 30 34*  GLUCOSE 101* 84 113*  BUN 27* 25* 23  CREATININE 1.25* 1.12 0.94  CALCIUM 8.8* 9.1 9.2  PROT  --   --  7.8  ALBUMIN  --   --  3.1*  AST  --   --  30  ALT  --   --  15  ALKPHOS  --   --  66  BILITOT  --   --  2.1*  GFRNONAA >60 >60 >60  ANIONGAP Hematology Recent Labs  Lab 08/04/20 1440 08/05/20 0546  08/06/20 0353  WBC 4.3 4.5 4.4  RBC 5.32 5.24 5.37  HGB 16.2 16.1 16.4  HCT 54.4* 54.3* 54.3*  MCV 102.3* 103.6* 101.1*  MCH 30.5 30.7 30.5  MCHC 29.8* 29.7* 30.2  RDW 14.5 14.4 14.2  PLT 247 239 222   BNP Recent Labs  Lab 08/04/20 2003  BNP 690.9*    DDimer  Recent Labs  Lab 08/05/20 2037  DDIMER 2.46*     Radiology  DG Chest 2 View  Result Date: 08/04/2020 CLINICAL DATA:  Shortness of breath, leg swelling, decreased O2 sats. EXAM: CHEST - 2 VIEW COMPARISON:  10/11/2013. FINDINGS: Trachea is midline. Heart is enlarged. Thoracic aorta is calcified. Mild basilar predominant interstitial prominence and indistinctness with bibasilar atelectasis and small bilateral pleural effusions. IMPRESSION: Mild congestive heart failure. Electronically Signed   By: Leanna Battles M.D.   On: 08/04/2020 15:03   CT ANGIO CHEST PE W OR WO CONTRAST  Result Date: 08/05/2020 CLINICAL DATA:  Leg swelling for two days and shortness of breath. EXAM: CT ANGIOGRAPHY CHEST WITH CONTRAST TECHNIQUE: Multidetector CT imaging of the chest was performed using the standard protocol during bolus administration of intravenous contrast. Multiplanar CT image reconstructions and MIPs were obtained to evaluate the vascular anatomy. CONTRAST:  OMNIPAQUE IOHEXOL 350 MG/ML SOLN COMPARISON:  Same day chest radiograph and report from CT chest dated 08/27/2001. FINDINGS: Cardiovascular: Motion artifact limits the sensitivity and specificity of the exam. Given this limitation, there is satisfactory opacification of the pulmonary arteries to the segmental level. Several subsegmental filling defects are seen in the pulmonary arteries supplying the bilateral upper and the left lower lobes, consistent with pulmonary emboli. The main pulmonary artery is dilated, measuring 3.9 cm in diameter, consistent with pulmonary hypertension. Vascular calcifications are seen in the aortic arch. The heart is mildly enlarged. No pericardial  effusion. Mediastinum/Nodes: No enlarged mediastinal, hilar, or axillary lymph nodes. Thyroid gland, trachea, and esophagus demonstrate no significant findings. Lungs/Pleura: Aspirated debris and bronchial wall thickening is seen in the right lower lobe. There is no pneumothorax on either side. There are moderate bilateral pleural effusions, right greater than left, with associated atelectasis. Upper Abdomen: No acute abnormality. Musculoskeletal: Degenerative changes are seen in the spine. Review of the MIP images confirms the above findings. IMPRESSION: 1. Several subsegmental pulmonary emboli in the pulmonary arteries supplying the bilateral upper lobes and the  left lower lobe. 2. Moderate bilateral pleural effusions, right greater than left, with associated atelectasis. 3. Aspirated debris and bronchial wall thickening in the right lower lobe likely reflect chronic aspiration. 4. Enlarged pulmonary artery likely representing pulmonary hypertension. Mild cardiomegaly. Aortic Atherosclerosis (ICD10-I70.0). These results were called by telephone at the time of interpretation on 08/05/2020 at 8:12 pm to provider Joen Laura, who verbally acknowledged these results. Electronically Signed   By: Romona Curls M.D.   On: 08/05/2020 20:17   ECHOCARDIOGRAM COMPLETE  Result Date: 08/05/2020    ECHOCARDIOGRAM REPORT   Patient Name:   Mitchell King Date of Exam: 08/05/2020 Medical Rec #:  [id number]       Height:       73.0 in Accession #:    [id number]      Weight:       180.6 lb Date of Birth:  01/31/1951        BSA:          2.060 m Patient Age:    70 years        BP:           143/76 mmHg Patient Gender: M               HR:           87 bpm. [facility] Procedure: 2D Echo Indications:    CHF-Acute Diastolic I50.31  History:        Patient has prior history of Echocardiogram examinations, most                 recent 07/16/2009. CHF, COPD; Risk Factors:Diabetes, Hypertension                 and  Current Smoker. ETOH.  Sonographer:    Jeryl Columbia Referring Phys: [id number] MOHAMMAD L GARBA IMPRESSIONS  1. Left ventricular ejection fraction, by estimation, is 55 to 60%. The left ventricle has normal function. The left ventricle has no regional wall motion abnormalities. There is mild concentric left ventricular hypertrophy. Left ventricular diastolic parameters were normal. There is the interventricular septum is flattened in systole, consistent with right ventricular pressure overload.  2. Right ventricular systolic function is normal. The right ventricular size is severely enlarged. Moderately increased right ventricular wall thickness. There is mildly elevated pulmonary artery systolic pressure. The estimated right ventricular systolic pressure is 44.4 mmHg (suspect this is an underestimation due to the faint TR jet).  3. Right atrial size was severely dilated.  4. The mitral valve is normal in structure. No evidence of mitral valve regurgitation. No evidence of mitral stenosis.  5. The aortic valve is tricuspid. Aortic valve regurgitation is not visualized. No aortic stenosis is present.  6. The inferior vena cava is dilated in size with <50% respiratory variability, suggesting right atrial pressure of 15 mmHg. Comparison(s): Prior images unable to be directly viewed, comparison made by report only. FINDINGS  Left Ventricle: Left ventricular ejection fraction, by estimation, is 55 to 60%. The left ventricle has normal function. The left ventricle has no regional wall motion abnormalities. The left ventricular internal cavity size was normal in size. There is  mild concentric left ventricular hypertrophy. The interventricular septum is flattened in systole, consistent with right ventricular pressure overload. Left ventricular diastolic parameters were normal. Indeterminate filling pressures. Right Ventricle: The right ventricular size is severely enlarged. Moderately increased right ventricular wall  thickness. Right ventricular systolic function is normal. There is mildly elevated pulmonary  artery systolic pressure. The tricuspid regurgitant  velocity is 2.71 m/s, and with an assumed right atrial pressure of 15 mmHg, the estimated right ventricular systolic pressure is 44.4 mmHg. Left Atrium: Left atrial size was normal in size. Right Atrium: Right atrial size was severely dilated. Pericardium: There is no evidence of pericardial effusion. Mitral Valve: The mitral valve is normal in structure. No evidence of mitral valve regurgitation. No evidence of mitral valve stenosis. Tricuspid Valve: The tricuspid valve is normal in structure. Tricuspid valve regurgitation is mild . No evidence of tricuspid stenosis. Aortic Valve: The aortic valve is tricuspid. Aortic valve regurgitation is not visualized. No aortic stenosis is present. Pulmonic Valve: The pulmonic valve was normal in structure. Pulmonic valve regurgitation is trivial. No evidence of pulmonic stenosis. Aorta: The aortic root is normal in size and structure. Venous: The inferior vena cava is dilated in size with less than 50% respiratory variability, suggesting right atrial pressure of 15 mmHg. IAS/Shunts: There is left bowing of the interatrial septum, suggestive of elevated right atrial pressure. No atrial level shunt detected by color flow Doppler.  LEFT VENTRICLE PLAX 2D LVIDd:         4.20 cm  Diastology LVIDs:         2.80 cm  LV e' medial:    8.05 cm/s LV PW:         1.20 cm  LV E/e' medial:  13.2 LV IVS:        1.40 cm  LV e' lateral:   9.14 cm/s LVOT diam:     2.00 cm  LV E/e' lateral: 11.6 LVOT Area:     3.14 cm  RIGHT VENTRICLE RV S prime:     10.70 cm/s TAPSE (M-mode): 2.0 cm LEFT ATRIUM           Index       RIGHT ATRIUM           Index LA diam:      3.60 cm 1.75 cm/m  RA Area:     18.70 cm LA Vol (A4C): 62.1 ml 30.14 ml/m RA Volume:   53.60 ml  26.02 ml/m   AORTA Ao Root diam: 3.40 cm MITRAL VALVE                TRICUSPID VALVE MV Area  (PHT): 5.54 cm     TR Peak grad:   29.4 mmHg MV Decel Time: 137 msec     TR Vmax:        271.00 cm/s MV E velocity: 106.00 cm/s MV A velocity: 84.80 cm/s   SHUNTS MV E/A ratio:  1.25         Systemic Diam: 2.00 cm Rachelle Hora Croitoru MD Electronically signed by Thurmon Fair MD Signature Date/Time: 08/05/2020/10:25:53 AM    Final     Cardiac Studies  TTE 08/05/2020 1. Left ventricular ejection fraction, by estimation, is 55 to 60%. The  left ventricle has normal function. The left ventricle has no regional  wall motion abnormalities. There is mild concentric left ventricular  hypertrophy. Left ventricular diastolic  parameters were normal. There is the interventricular septum is flattened  in systole, consistent with right ventricular pressure overload.  2. Right ventricular systolic function is normal. The right ventricular  size is severely enlarged. Moderately increased right ventricular wall  thickness. There is mildly elevated pulmonary artery systolic pressure.  The estimated right ventricular  systolic pressure is 44.4 mmHg (suspect this is an underestimation due to  the faint  TR jet).  3. Right atrial size was severely dilated.  4. The mitral valve is normal in structure. No evidence of mitral valve  regurgitation. No evidence of mitral stenosis.  5. The aortic valve is tricuspid. Aortic valve regurgitation is not  visualized. No aortic stenosis is present.  6. The inferior vena cava is dilated in size with <50% respiratory  variability, suggesting right atrial pressure of 15 mmHg.   CTA 08/05/2020 1. Several subsegmental pulmonary emboli in the pulmonary arteries supplying the bilateral upper lobes and the left lower lobe. 2. Moderate bilateral pleural effusions, right greater than left, with associated atelectasis. 3. Aspirated debris and bronchial wall thickening in the right lower lobe likely reflect chronic aspiration. 4. Enlarged pulmonary artery likely representing  pulmonary hypertension. Mild cardiomegaly.  Patient Profile  Mitchell King is a 70 y.o. male with hypertension, COPD, diabetes, former tobacco abuse who was admitted on 08/04/2020 with right heart failure and found to have several pulmonary emboli.  Assessment & Plan   1. Right Heart Failure/Cor Pulmonale  -Echo consistent with cor pulmonale. Several subsegmental PEs found on CT scan.  He reports an abrupt change over the last 2 weeks that is resulted in swelling in his legs.  Initial chest x-ray was not that impressive for volume but he does have some pleural effusions.  This did not explain his saturations in the 70s.  I think the pulmonary malaise him explains his entire picture. -Continue heparin drip. -He remains grossly volume overloaded.  Net -6.1 L with 40 mg of Lasix IV twice daily.  Would recommend to continue this. -I think we can defer pulmonary hypertension work-up for now as we have a clear and reversible cause.  Would favor treating him with anticoagulation and then reassessing an echocardiogram in a few months. -I suspect he has 2-3 more days of IV diuresis.  2.  Metabolic alkalosis secondary to diuresis -Add Diamox 500 mg twice daily for 2 days today.  3.  Hypertension -Increase losartan to 100 mg daily.  Continue Coreg 25 twice a day.  We will add Aldactone as able.  Right now potassium a bit high.  This will improve with diuresis.  For questions or updates, please contact [facility] Please consult [email or web address] for contact info under   Time Spent with Patient: I have spent a total of 35 minutes with patient reviewing hospital notes, telemetry, EKGs, labs and examining the patient as well as establishing an assessment and plan that was discussed with the patient.  > 50% of time was spent in direct patient care.    Signed, Lenna Gilford. Flora Lipps, MD [facility]  [facility]  08/06/2020 8:24 AM

## 2020-08-06 NOTE — Progress Notes (Signed)
PROGRESS NOTE    Mitchell King  BOF:[id number] DOB: 1950/11/04 DOA: 08/04/2020 PCP: Patient, No Pcp Per   Brief Narrative:  Mitchell King is a 70 y.o. M with COPD not on home O2, DM and HTN no longer on medication who presented with at least 1 week progressive LE edema and dyspnea on exertion not responsive to home albuterol.  In the ER, he was hypoxic to 70%.  CXR showed bilateral pulmonary edema, bilateral small effusions.  BNP 600.  He was started on Lasix.  Assessment & Plan:  Acute hypoxemic respiratory failure:  -In the setting of acute right-sided heart failure/cor pulmonale & several subsegmental PE which was noted on CTA chest on 2/15. -BNP: 690 on admission.  Reviewed chest x-ray.  COVID-19 negative -Continue Lasix 40 IV twice daily -Strict INO's and daily weight. -Reviewed recent echo -Cardiology on board-appreciate help -Net -6.1 L since admission.  Requiring 6 L of oxygen via nasal cannula-we will try to wean off of oxygen as tolerated. -Continue with heparin for PE -Doppler ultrasound of bilateral lower extremities resulted negative for acute DVT  Hypertensive urgency: -Blood pressure is improving-losartan dose increased to 100 mg daily.  Continue Coreg 25 mg twice daily -Continue hydralazine as needed -Monitor blood pressure closely  Type 2 diabetes mellitus: Well controlled.  A1c 6.2% -Continue sliding scale insulin and monitor blood sugar closely  Metabolic alkalosis: -In the setting of diuresis. -Diamox 500 mg twice daily for 2 days added by cardiology  COPD: No wheezing noted on exam.  Continue albuterol as needed  Tobacco abuse: Smokes half pack of cigarettes per day.  Counseled about cessation  Macrocytosis: MCV: 101.  TSH: WNL -We will check B12 and folate.  DVT prophylaxis: Full dose heparin Code Status: Full code Family Communication:  None present at bedside.  Plan of care discussed with patient in length and he verbalized understanding and  agreed with it. Disposition Plan: To be determined  Consultants:   Cardiology  Procedures:   None  Antimicrobials:   None  Status is: Inpatient   Dispo: The patient is from: Home              Anticipated d/c is to: Home              Anticipated d/c date is: 3 days              Patient currently is not medically stable to d/c.   Difficult to place patient No         Subjective: Patient seen and examined.  Tells me that his breathing has improved however he continues to have bilateral leg swelling, denies fever, chills, chest pain, nausea or vomiting.  Has cough but denies wheezing.  No acute events overnight.    Objective: Vitals:   08/06/20 0322 08/06/20 0755 08/06/20 0951 08/06/20 1206  BP: 138/82 (!) 143/92 139/72 118/77  Pulse: 70 71  70  Resp: 20 20  18   Temp: 98.7 F (37.1 C) 98.4 F (36.9 C)  98.7 F (37.1 C)  TempSrc: Oral Oral  Oral  SpO2: 92% 94%  95%  Weight: 81.2 kg     Height:        Intake/Output Summary (Last 24 hours) at 08/06/2020 1333 Last data filed at 08/06/2020 1000 Gross per 24 hour  Intake 616.06 ml  Output 3250 ml  Net -2633.94 ml   Filed Weights   08/05/20 0514 08/06/20 0322  Weight: 81.9 kg 81.2 kg  Examination:  General exam: Appears calm and comfortable, on nasal cannula, communicating well Respiratory system: Diminished breath sound noted on the bases.  No wheezing or rhonchi. Cardiovascular system: S1 & S2 heard, RRR. No JVD, murmurs, rubs, gallops or clicks.  Bilateral 3+ pitting edema positive Gastrointestinal system: Abdomen is nondistended, soft and nontender. No organomegaly or masses felt. Normal bowel sounds heard. Central nervous system: Alert and oriented. No focal neurological deficits. Extremities: Symmetric 5 x 5 power. Skin: No rashes, lesions or ulcers Psychiatry: Judgement and insight appear normal. Mood & affect appropriate.    Data Reviewed: I have personally reviewed following labs and imaging  studies  CBC: Recent Labs  Lab 08/04/20 1440 08/05/20 0546 08/06/20 0353  WBC 4.3 4.5 4.4  HGB 16.2 16.1 16.4  HCT 54.4* 54.3* 54.3*  MCV 102.3* 103.6* 101.1*  PLT 247 239 222   Basic Metabolic Panel: Recent Labs  Lab 08/04/20 1440 08/05/20 0546 08/06/20 0353  NA 135 137 137  K 4.4 4.3 4.7  CL 99 97* 93*  CO2 27 30 34*  GLUCOSE 101* 84 113*  BUN 27* 25* 23  CREATININE 1.25* 1.12 0.94  CALCIUM 8.8* 9.1 9.2   GFR: Estimated Creatinine Clearance: 82.6 mL/min (by C-G formula based on SCr of 0.94 mg/dL). Liver Function Tests: Recent Labs  Lab 08/06/20 0353  AST 30  ALT 15  ALKPHOS 66  BILITOT 2.1*  PROT 7.8  ALBUMIN 3.1*   No results for input(s): LIPASE, AMYLASE in the last 168 hours. No results for input(s): AMMONIA in the last 168 hours. Coagulation Profile: Recent Labs  Lab 08/04/20 2055  INR 1.1   Cardiac Enzymes: No results for input(s): CKTOTAL, CKMB, CKMBINDEX, TROPONINI in the last 168 hours. BNP (last 3 results) No results for input(s): PROBNP in the last 8760 hours. HbA1C: Recent Labs    08/05/20 0546  HGBA1C 6.2*   CBG: No results for input(s): GLUCAP in the last 168 hours. Lipid Profile: No results for input(s): CHOL, HDL, LDLCALC, TRIG, CHOLHDL, LDLDIRECT in the last 72 hours. Thyroid Function Tests: Recent Labs    08/06/20 0353  TSH 0.858   Anemia Panel: No results for input(s): VITAMINB12, FOLATE, FERRITIN, TIBC, IRON, RETICCTPCT in the last 72 hours. Sepsis Labs: No results for input(s): PROCALCITON, LATICACIDVEN in the last 168 hours.  Recent Results (from the past 240 hour(s))  SARS CORONAVIRUS 2 (TAT 6-24 HRS) Nasopharyngeal Nasopharyngeal Swab     Status: None   Collection Time: 08/05/20  1:03 AM   Specimen: Nasopharyngeal Swab  Result Value Ref Range Status   SARS Coronavirus 2 NEGATIVE NEGATIVE Final    Comment: (NOTE) SARS-CoV-2 target nucleic acids are NOT DETECTED.  The SARS-CoV-2 RNA is generally detectable in  upper and lower respiratory specimens during the acute phase of infection. Negative results do not preclude SARS-CoV-2 infection, do not rule out co-infections with other pathogens, and should not be used as the sole basis for treatment or other patient management decisions. Negative results must be combined with clinical observations, patient history, and epidemiological information. The expected result is Negative.  Fact Sheet for Patients: HairSlick.nohttps://www.fda.gov/media/138098/download  Fact Sheet for Healthcare Providers: quierodirigir.comhttps://www.fda.gov/media/138095/download  This test is not yet approved or cleared by the [place] FDA and  has been authorized for detection and/or diagnosis of SARS-CoV-2 by FDA under an Emergency Use Authorization (EUA). This EUA will remain  in effect (meaning this test can be used) for the duration of the COVID-19 declaration under Se ction 564(b)(1)  of the Act, 21 U.S.C. section 360bbb-3(b)(1), unless the authorization is terminated or revoked sooner.  Performed at [facility], [address], [place], [place] [address]       Radiology Studies: DG Chest 2 View  Result Date: 08/04/2020 CLINICAL DATA:  Shortness of breath, leg swelling, decreased O2 sats. EXAM: CHEST - 2 VIEW COMPARISON:  10/11/2013. FINDINGS: Trachea is midline. Heart is enlarged. Thoracic aorta is calcified. Mild basilar predominant interstitial prominence and indistinctness with bibasilar atelectasis and small bilateral pleural effusions. IMPRESSION: Mild congestive heart failure. Electronically Signed   By: Leanna Battles M.D.   On: 08/04/2020 15:03   CT ANGIO CHEST PE W OR WO CONTRAST  Result Date: 08/05/2020 CLINICAL DATA:  Leg swelling for two days and shortness of breath. EXAM: CT ANGIOGRAPHY CHEST WITH CONTRAST TECHNIQUE: Multidetector CT imaging of the chest was performed using the standard protocol during bolus administration of intravenous contrast. Multiplanar CT  image reconstructions and MIPs were obtained to evaluate the vascular anatomy. CONTRAST:  OMNIPAQUE IOHEXOL 350 MG/ML SOLN COMPARISON:  Same day chest radiograph and report from CT chest dated 08/27/2001. FINDINGS: Cardiovascular: Motion artifact limits the sensitivity and specificity of the exam. Given this limitation, there is satisfactory opacification of the pulmonary arteries to the segmental level. Several subsegmental filling defects are seen in the pulmonary arteries supplying the bilateral upper and the left lower lobes, consistent with pulmonary emboli. The main pulmonary artery is dilated, measuring 3.9 cm in diameter, consistent with pulmonary hypertension. Vascular calcifications are seen in the aortic arch. The heart is mildly enlarged. No pericardial effusion. Mediastinum/Nodes: No enlarged mediastinal, hilar, or axillary lymph nodes. Thyroid gland, trachea, and esophagus demonstrate no significant findings. Lungs/Pleura: Aspirated debris and bronchial wall thickening is seen in the right lower lobe. There is no pneumothorax on either side. There are moderate bilateral pleural effusions, right greater than left, with associated atelectasis. Upper Abdomen: No acute abnormality. Musculoskeletal: Degenerative changes are seen in the spine. Review of the MIP images confirms the above findings. IMPRESSION: 1. Several subsegmental pulmonary emboli in the pulmonary arteries supplying the bilateral upper lobes and the left lower lobe. 2. Moderate bilateral pleural effusions, right greater than left, with associated atelectasis. 3. Aspirated debris and bronchial wall thickening in the right lower lobe likely reflect chronic aspiration. 4. Enlarged pulmonary artery likely representing pulmonary hypertension. Mild cardiomegaly. Aortic Atherosclerosis (ICD10-I70.0). These results were called by telephone at the time of interpretation on 08/05/2020 at 8:12 pm to provider Joen Laura, who verbally  acknowledged these results. Electronically Signed   By: Romona Curls M.D.   On: 08/05/2020 20:17   ECHOCARDIOGRAM COMPLETE  Result Date: 08/05/2020    ECHOCARDIOGRAM REPORT   Patient Name:   Mitchell King Date of Exam: 08/05/2020 Medical Rec #:  [id number]       Height:       73.0 in Accession #:    [id number]      Weight:       180.6 lb Date of Birth:  May 02, 1951        BSA:          2.060 m Patient Age:    70 years        BP:           143/76 mmHg Patient Gender: M               HR:           87 bpm. [facility] Procedure: 2D Echo Indications:    CHF-Acute Diastolic I50.31  History:        Patient has prior history of Echocardiogram examinations, most                 recent 07/16/2009. CHF, COPD; Risk Factors:Diabetes, Hypertension                 and Current Smoker. ETOH.  Sonographer:    Jeryl Columbia Referring Phys: [id number] MOHAMMAD L GARBA IMPRESSIONS  1. Left ventricular ejection fraction, by estimation, is 55 to 60%. The left ventricle has normal function. The left ventricle has no regional wall motion abnormalities. There is mild concentric left ventricular hypertrophy. Left ventricular diastolic parameters were normal. There is the interventricular septum is flattened in systole, consistent with right ventricular pressure overload.  2. Right ventricular systolic function is normal. The right ventricular size is severely enlarged. Moderately increased right ventricular wall thickness. There is mildly elevated pulmonary artery systolic pressure. The estimated right ventricular systolic pressure is 44.4 mmHg (suspect this is an underestimation due to the faint TR jet).  3. Right atrial size was severely dilated.  4. The mitral valve is normal in structure. No evidence of mitral valve regurgitation. No evidence of mitral stenosis.  5. The aortic valve is tricuspid. Aortic valve regurgitation is not visualized. No aortic stenosis is present.  6. The inferior vena cava is dilated in size with  <50% respiratory variability, suggesting right atrial pressure of 15 mmHg. Comparison(s): Prior images unable to be directly viewed, comparison made by report only. FINDINGS  Left Ventricle: Left ventricular ejection fraction, by estimation, is 55 to 60%. The left ventricle has normal function. The left ventricle has no regional wall motion abnormalities. The left ventricular internal cavity size was normal in size. There is  mild concentric left ventricular hypertrophy. The interventricular septum is flattened in systole, consistent with right ventricular pressure overload. Left ventricular diastolic parameters were normal. Indeterminate filling pressures. Right Ventricle: The right ventricular size is severely enlarged. Moderately increased right ventricular wall thickness. Right ventricular systolic function is normal. There is mildly elevated pulmonary artery systolic pressure. The tricuspid regurgitant  velocity is 2.71 m/s, and with an assumed right atrial pressure of 15 mmHg, the estimated right ventricular systolic pressure is 44.4 mmHg. Left Atrium: Left atrial size was normal in size. Right Atrium: Right atrial size was severely dilated. Pericardium: There is no evidence of pericardial effusion. Mitral Valve: The mitral valve is normal in structure. No evidence of mitral valve regurgitation. No evidence of mitral valve stenosis. Tricuspid Valve: The tricuspid valve is normal in structure. Tricuspid valve regurgitation is mild . No evidence of tricuspid stenosis. Aortic Valve: The aortic valve is tricuspid. Aortic valve regurgitation is not visualized. No aortic stenosis is present. Pulmonic Valve: The pulmonic valve was normal in structure. Pulmonic valve regurgitation is trivial. No evidence of pulmonic stenosis. Aorta: The aortic root is normal in size and structure. Venous: The inferior vena cava is dilated in size with less than 50% respiratory variability, suggesting right atrial pressure of 15 mmHg.  IAS/Shunts: There is left bowing of the interatrial septum, suggestive of elevated right atrial pressure. No atrial level shunt detected by color flow Doppler.  LEFT VENTRICLE PLAX 2D LVIDd:         4.20 cm  Diastology LVIDs:         2.80 cm  LV e' medial:    8.05 cm/s LV PW:  1.20 cm  LV E/e' medial:  13.2 LV IVS:        1.40 cm  LV e' lateral:   9.14 cm/s LVOT diam:     2.00 cm  LV E/e' lateral: 11.6 LVOT Area:     3.14 cm  RIGHT VENTRICLE RV S prime:     10.70 cm/s TAPSE (M-mode): 2.0 cm LEFT ATRIUM           Index       RIGHT ATRIUM           Index LA diam:      3.60 cm 1.75 cm/m  RA Area:     18.70 cm LA Vol (A4C): 62.1 ml 30.14 ml/m RA Volume:   53.60 ml  26.02 ml/m   AORTA Ao Root diam: 3.40 cm MITRAL VALVE                TRICUSPID VALVE MV Area (PHT): 5.54 cm     TR Peak grad:   29.4 mmHg MV Decel Time: 137 msec     TR Vmax:        271.00 cm/s MV E velocity: 106.00 cm/s MV A velocity: 84.80 cm/s   SHUNTS MV E/A ratio:  1.25         Systemic Diam: 2.00 cm Rachelle Hora Croitoru MD Electronically signed by Thurmon Fair MD Signature Date/Time: 08/05/2020/10:25:53 AM    Final    VAS [place] LOWER EXTREMITY VENOUS (DVT)  Result Date: 08/06/2020  Lower Venous DVT Study Indications: Pulmonary embolism, SOB, bilateral swelling.  Comparison Study: 08-05-2020 CTA chest was positive for pulmonary embolism. Performing Technologist: Jean Rosenthal RDMS  Examination Guidelines: A complete evaluation includes B-mode imaging, spectral Doppler, color Doppler, and power Doppler as needed of all accessible portions of each vessel. Bilateral testing is considered an integral part of a complete examination. Limited examinations for reoccurring indications may be performed as noted. The reflux portion of the exam is performed with the patient in reverse Trendelenburg.  +---------+---------------+---------+-----------+----------+--------------+ RIGHT    CompressibilityPhasicitySpontaneityPropertiesThrombus Aging  +---------+---------------+---------+-----------+----------+--------------+ CFV      Full           No       Yes                                 +---------+---------------+---------+-----------+----------+--------------+ SFJ      Full                                                        +---------+---------------+---------+-----------+----------+--------------+ FV Prox  Full                                                        +---------+---------------+---------+-----------+----------+--------------+ FV Mid   Full                                                        +---------+---------------+---------+-----------+----------+--------------+ FV DistalFull                                                        +---------+---------------+---------+-----------+----------+--------------+  PFV      Full                                                        +---------+---------------+---------+-----------+----------+--------------+ POP      Full           No       Yes                                 +---------+---------------+---------+-----------+----------+--------------+ PTV      Full                                                        +---------+---------------+---------+-----------+----------+--------------+ PERO     Full                                                        +---------+---------------+---------+-----------+----------+--------------+   +---------+---------------+---------+-----------+----------+--------------+ LEFT     CompressibilityPhasicitySpontaneityPropertiesThrombus Aging +---------+---------------+---------+-----------+----------+--------------+ CFV      Full           No       Yes                                 +---------+---------------+---------+-----------+----------+--------------+ SFJ      Full                                                         +---------+---------------+---------+-----------+----------+--------------+ FV Prox  Full                                                        +---------+---------------+---------+-----------+----------+--------------+ FV Mid   Full                                                        +---------+---------------+---------+-----------+----------+--------------+ FV DistalFull                                                        +---------+---------------+---------+-----------+----------+--------------+ PFV      Full                                                        +---------+---------------+---------+-----------+----------+--------------+  POP      Full           No       Yes                                 +---------+---------------+---------+-----------+----------+--------------+ PTV      Full                                                        +---------+---------------+---------+-----------+----------+--------------+ PERO     Full                                                        +---------+---------------+---------+-----------+----------+--------------+     Summary: RIGHT: - There is no evidence of deep vein thrombosis in the lower extremity.  - No cystic structure found in the popliteal fossa.  - Pulsatile waveforms consistent with elevated right heart pressures.  LEFT: - There is no evidence of deep vein thrombosis in the lower extremity.  - No cystic structure found in the popliteal fossa.  - Pulsatile waveforms consistent with elevated right heart pressures.  *See table(s) above for measurements and observations.    Preliminary     Scheduled Meds: . acetaZOLAMIDE  500 mg Oral Q12H  . carvedilol  25 mg Oral BID WC  . furosemide  40 mg Intravenous BID  . losartan  100 mg Oral Daily  . sodium chloride flush  3 mL Intravenous Q12H   Continuous Infusions: . sodium chloride    . heparin 1,500 Units/hr (08/06/20 1115)     LOS: 2 days    Time spent: 35 minutes   Zorina Mallin Estill Cotta, MD [facility]  If 7PM-7AM, please contact night-coverage [email or web address] 08/06/2020, 1:33 PM

## 2020-08-06 NOTE — Progress Notes (Signed)
Lower extremity venous bilateral study completed.  Preliminary results relayed to RN.   See CV Proc for preliminary results report.   Jameya Pontiff, RDMS  

## 2020-08-06 NOTE — Progress Notes (Signed)
Heart Failure Stewardship Pharmacist Progress Note   PCP: Patient, No Pcp Per PCP-Cardiologist: No primary care provider on file.    HPI:  70 yo M with PMH of T2DM, COPD, HTN, prior alcohol abuse, and medication noncompliance. He presented to the ED On 08/04/20 with progressive shortness of breath, DOE, PND, and orthopnea over the last two weeks. He was admitted for acute CHF. An ECHO was done on 08/05/20 and LVEF is 55-60%. Also found to have several scattered small PE's.  Current HF Medications: Furosemide 40 mg IV BID Diamox 500 mg PO q12h x 2 days Carvedilol 25 mg BID Losartan 100 mg daily  Prior to admission HF Medications: None  Pertinent Lab Values: . Serum creatinine 0.94, BUN 23, Potassium 4.7, Sodium 137, BNP 690.9  Vital Signs: . Weight: 179 lbs (admission weight: 180 lbs) . Blood pressure: 140/90s . Heart rate: 70s   Medication Assistance / Insurance Benefits Check: Does the patient have prescription insurance?  Yes Type of insurance plan: [facility]  Outpatient Pharmacy:  Prior to admission outpatient pharmacy: [facility] Is the patient willing to use [facility] TOC pharmacy at discharge? Yes   Assessment: 1. Acute diastolic CHF (EF 27-25%). NYHA class III symptoms. Remains fluid overloaded on MD exam.  - Continue furosemide 40 mg IV BID, agree with adding Diamox. KCl 10 mEq BID ordered. Consider stopping the additional potassium with K of 4.7 today. - Continue carvedilol 25 mg BID - Agree with increasing losartan to 100 mg daily. Could consider optimizing further to [facility] given newer indication for LVEF <57% - Consider starting spironolactone 25 mg daily (augment diuresis, resistant HTN) - Consider starting Jardiance 10 mg daily prior to discharge (following IV diuresis). [place] Pepper is the SGLT2i approved on the [place] formulary   Plan: 1) Medication changes recommended at this time: - Stop KCl  2) Patient assistance: - [facility] would benefit [facility] medications as they would be free for him (Eliquis is on the [place] formulary for treatment of VTE)  3)  Education  - To be completed prior to discharge  Sharen Hones, PharmD, BCPS Heart Failure Stewardship Pharmacist Phone [phone]

## 2020-08-07 ENCOUNTER — Inpatient Hospital Stay (HOSPITAL_COMMUNITY): Payer: Medicare Other

## 2020-08-07 DIAGNOSIS — I509 Heart failure, unspecified: Secondary | ICD-10-CM | POA: Diagnosis not present

## 2020-08-07 DIAGNOSIS — F1010 Alcohol abuse, uncomplicated: Secondary | ICD-10-CM | POA: Diagnosis not present

## 2020-08-07 DIAGNOSIS — J449 Chronic obstructive pulmonary disease, unspecified: Secondary | ICD-10-CM | POA: Diagnosis not present

## 2020-08-07 DIAGNOSIS — I2609 Other pulmonary embolism with acute cor pulmonale: Secondary | ICD-10-CM | POA: Diagnosis not present

## 2020-08-07 LAB — URINALYSIS, ROUTINE W REFLEX MICROSCOPIC
Bilirubin Urine: NEGATIVE
Glucose, UA: NEGATIVE mg/dL
Ketones, ur: NEGATIVE mg/dL
Leukocytes,Ua: NEGATIVE
Nitrite: NEGATIVE
Protein, ur: NEGATIVE mg/dL
RBC / HPF: >50 RBC/hpf — ABNORMAL HIGH (ref 0–5)
Specific Gravity, Urine: 1.011 (ref 1.005–1.030)
pH: 5 (ref 5.0–8.0)

## 2020-08-07 LAB — HEPATIC FUNCTION PANEL
ALT: 15 U/L (ref 0–44)
AST: 29 U/L (ref 15–41)
Albumin: 2.9 g/dL — ABNORMAL LOW (ref 3.5–5.0)
Alkaline Phosphatase: 60 U/L (ref 38–126)
Bilirubin, Direct: 0.4 mg/dL — ABNORMAL HIGH (ref 0.0–0.2)
Indirect Bilirubin: 0.9 mg/dL (ref 0.3–0.9)
Total Bilirubin: 1.3 mg/dL — ABNORMAL HIGH (ref 0.3–1.2)
Total Protein: 7.1 g/dL (ref 6.5–8.1)

## 2020-08-07 LAB — GLUCOSE, CAPILLARY: Glucose-Capillary: 155 mg/dL — ABNORMAL HIGH (ref 70–99)

## 2020-08-07 LAB — CBC
HCT: 50.4 % (ref 39.0–52.0)
Hemoglobin: 15.2 g/dL (ref 13.0–17.0)
MCH: 30.8 pg (ref 26.0–34.0)
MCHC: 30.2 g/dL (ref 30.0–36.0)
MCV: 102 fL — ABNORMAL HIGH (ref 80.0–100.0)
Platelets: 190 10*3/uL (ref 150–400)
RBC: 4.94 MIL/uL (ref 4.22–5.81)
RDW: 14 % (ref 11.5–15.5)
WBC: 5.3 10*3/uL (ref 4.0–10.5)
nRBC: 0 % (ref 0.0–0.2)

## 2020-08-07 LAB — BASIC METABOLIC PANEL
Anion gap: 9 (ref 5–15)
BUN: 30 mg/dL — ABNORMAL HIGH (ref 8–23)
CO2: 33 mmol/L — ABNORMAL HIGH (ref 22–32)
Calcium: 8.6 mg/dL — ABNORMAL LOW (ref 8.9–10.3)
Chloride: 94 mmol/L — ABNORMAL LOW (ref 98–111)
Creatinine, Ser: 1.04 mg/dL (ref 0.61–1.24)
GFR, Estimated: >60 mL/min (ref >60)
Glucose, Bld: 137 mg/dL — ABNORMAL HIGH (ref 70–99)
Potassium: 4.5 mmol/L (ref 3.5–5.1)
Sodium: 136 mmol/L (ref 135–145)

## 2020-08-07 LAB — MAGNESIUM: Magnesium: 1.8 mg/dL (ref 1.7–2.4)

## 2020-08-07 LAB — LACTIC ACID, PLASMA
Lactic Acid, Venous: 1.1 mmol/L (ref 0.5–1.9)
Lactic Acid, Venous: 1.1 mmol/L (ref 0.5–1.9)

## 2020-08-07 LAB — HEPARIN LEVEL (UNFRACTIONATED): Heparin Unfractionated: 0.35 IU/mL (ref 0.30–0.70)

## 2020-08-07 LAB — AMMONIA: Ammonia: 75 umol/L — ABNORMAL HIGH (ref 9–35)

## 2020-08-07 MED ORDER — FUROSEMIDE 10 MG/ML IJ SOLN
120.0000 mg | Freq: Two times a day (BID) | INTRAVENOUS | Status: DC
  Administered 2020-08-07: 120 mg via INTRAVENOUS
  Filled 2020-08-07 (×2): qty 12
  Filled 2020-08-07: qty 10

## 2020-08-07 MED ORDER — MAGNESIUM SULFATE 2 GM/50ML IV SOLN
2.0000 g | Freq: Once | INTRAVENOUS | Status: AC
  Administered 2020-08-07: 2 g via INTRAVENOUS
  Filled 2020-08-07: qty 50

## 2020-08-07 MED ORDER — ACETAZOLAMIDE ER 500 MG PO CP12
500.0000 mg | ORAL_CAPSULE | Freq: Two times a day (BID) | ORAL | Status: AC
  Administered 2020-08-07 (×2): 500 mg via ORAL
  Filled 2020-08-07 (×2): qty 1

## 2020-08-07 MED ORDER — LACTULOSE 10 GM/15ML PO SOLN
20.0000 g | Freq: Two times a day (BID) | ORAL | Status: DC
  Administered 2020-08-07 – 2020-08-08 (×3): 20 g via ORAL
  Filled 2020-08-07 (×4): qty 30

## 2020-08-07 NOTE — Progress Notes (Signed)
Heart Failure Stewardship Pharmacist Progress Note   PCP: Patient, No Pcp Per PCP-Cardiologist: No primary care provider on file.    HPI:  70 yo M with PMH of T2DM, COPD, HTN, prior alcohol abuse, and medication noncompliance. He presented to the ED On 08/04/20 with progressive shortness of breath, DOE, PND, and orthopnea over the last two weeks. He was admitted for acute CHF. An ECHO was done on 08/05/20 and LVEF is 55-60%. Also found to have several scattered small PE's.  Current HF Medications: Furosemide 80 mg IV BID Diamox 500 mg PO q12h x 2 days (end today) Carvedilol 25 mg BID Losartan 100 mg daily  Prior to admission HF Medications: None  Pertinent Lab Values: . Serum creatinine 1.04, BUN 30, Potassium 4.5, Sodium 136, BNP 690.9  Vital Signs: . Weight: 179>193 lbs (inaccurate) (admission weight: 180 lbs) . Blood pressure: 110-150/70s . Heart rate: 50-60s   Medication Assistance / Insurance Benefits Check: Does the patient have prescription insurance?  Yes Type of insurance plan: [facility]  Outpatient Pharmacy:  Prior to admission outpatient pharmacy: [facility] Is the patient willing to use [facility]OC pharmacy at discharge? Yes   Assessment: 1. Acute diastolic CHF (EF 19-14%). NYHA class III symptoms. Remains fluid overloaded on MD exam.  - Continue furosemide 80 mg IV BID and diamox. Note cardiology may increase to 120 mg IV BID today pending response.  - Continue carvedilol 25 mg BID - Continue losartan 100 mg daily. Could consider optimizing further to [facility] given newer indication for LVEF <57% - Consider starting spironolactone 25 mg daily (augment diuresis, resistant HTN) - Consider starting Jardiance 10 mg daily prior to discharge (following IV diuresis). [place] Pepper is the SGLT2i approved on the [place] formulary   Plan: 1) Medication changes recommended at this time: - Stwitch losartan to Entresto 49/51 mg BID if no further dose increase of IV lasix is required  today  2) Patient assistance: - [facility] would [facility] as they would be free for him (Eliquis is on the [place] formulary for treatment of VTE)  3)  Education  - To be completed prior to discharge  Sharen Hones, PharmD, BCPS Heart Failure Stewardship Pharmacist Phone [phone]

## 2020-08-07 NOTE — Significant Event (Addendum)
Rapid Response Event Note   Reason for Call : Neuro change, AMS, sudden onset of confusion Initial Focused Assessment:  I was notified by nursing staff regarding patient with acute onset of confusion. CBG 155. Upon arrival, Mitchell King was drowsy but arousable to voice. Oriented to self, but disoriented to place, time and situation.  He stated his age, but could not answer for the month. He kept perseverating 70, 70, 70. Face symmetrical, blinks to threat bilaterally. No fixed gaze. No alteration in sensation. He is generally weak however equally weak on both sides in upper and lower extremities. Ataxia was not tested due to pts inability to cooperate. No aphasia or dysarthria. Positive for asterixis.  NIHSS 2 (1a, 1b) LSW 0330 Pt is on heparin gtt for Acute PE. No LVO symptoms. No clinical indication to initiate code stroke.  Dr. Carren Rang notified via [facility] and order received for Stat Head CT.  Ct results - No acute or reversible finding. Per Dr. Grace Isaac.   Interventions:  -Stat Head CT  Plan of Care:  -Monitor for further neurological change.  -Notify primary svc and/or RRRN if further assistance needed.    Call Time: 0343  Arrival Time: 0351 End Time: 0500  Rose Fillers, RN

## 2020-08-07 NOTE — Progress Notes (Addendum)
PROGRESS NOTE    KENDRIX ORMAN  ZOX:[id number] DOB: 11-Jun-1951 DOA: 08/04/2020 PCP: Patient, No Pcp Per   Brief Narrative:  Mr. Kiraly is a 70 y.o. M with COPD not on home O2, DM and HTN no longer on medication who presented with at least 1 week progressive LE edema and dyspnea on exertion not responsive to home albuterol.  In the ER, he was hypoxic to 70%.  CXR showed bilateral pulmonary edema, bilateral small effusions.  BNP 600.  He was started on Lasix.  Assessment & Plan:  Acute hypoxemic respiratory failure:  -In the setting of acute right-sided heart failure/cor pulmonale & several subsegmental PE which was noted on CTA chest on 2/15. -BNP: 690 on admission.  Reviewed chest x-ray.  COVID-19 negative -Continue Lasix 80 mg IV twice daily -Strict INO's and daily weight. -Reviewed recent echo-showed severely dilated RV -Cardiology on board-appreciate help -Net - 8.9 L since admission.  Requiring 4-6 L of oxygen via nasal cannula-we will try to wean off of oxygen as tolerated. -Continue with heparin for PE -Doppler ultrasound of bilateral lower extremities resulted negative for acute DVT  Acute metabolic encephalopathy: -Hospital delirium versus hyperammonemia.  -CT head on 2/16: Negative for acute findings.  - Liver enzymes: WNL.  Right upper quadrant ultrasound: Pending.  Ammonia level: 75-added lactulose. Repeat ammonia level tomorrow a.m. -Started on delirium precautions, check UA, lactic acid -We will consult PT/OT/SLP -On fall/aspiration precautions  Hypertensive urgency: -Blood pressure is improving -Continue losartan and Coreg.-Continue hydralazine as needed -Monitor blood pressure closely  Type 2 diabetes mellitus: Well controlled.  A1c 6.2% -monitor blood sugar closely  Metabolic alkalosis: -In the setting of diuresis. -Diamox 500 mg twice daily for 2 days added by cardiology  COPD: No wheezing noted on exam.  Continue albuterol as needed  Tobacco abuse:  Smokes 1.5 pack of cigarettes per day.  Counseled about cessation  Macrocytosis: MCV: 101.   -B12, folate, TSH: WNL  Magnesium: Replenished.  Repeat magnesium level tomorrow a.m.  DVT prophylaxis: Full dose heparin Code Status: Full code Family Communication:  None present at bedside.  Plan of care discussed with patient in length and he verbalized understanding and agreed with it.  I called patient's son & discussed plan of care & he verbalized understanding.  Disposition Plan: To be determined  Consultants:   Cardiology  Procedures:   None  Antimicrobials:   None  Status is: Inpatient   Dispo: The patient is from: Home              Anticipated d/c is to: Home              Anticipated d/c date is: 3 days              Patient currently is not medically stable to d/c.   Difficult to place patient No     Subjective: Patient seen and examined.  Very drowsy but arousable with verbal command however goes back to sleep very easily.  Following commands intermittently.  Last night due to mental status change- he underwent CT head which resulted negative for acute changes.  Objective: Vitals:   08/06/20 1932 08/07/20 0029 08/07/20 0344 08/07/20 1113  BP: 104/61 109/69 (!) 159/82 115/69  Pulse: 62 60 62 63  Resp: Temp: 97.9 F (36.6 C) (!) 97.4 F (36.3 C) (!) 97.4 F (36.3 C) (!) 97.3 F (36.3 C)  TempSrc: Oral Oral Oral Oral  SpO2: (!) 89% 99%  95% 100%  Weight:  87.9 kg    Height:        Intake/Output Summary (Last 24 hours) at 08/07/2020 1321 Last data filed at 08/07/2020 1012 Gross per 24 hour  Intake 1062.02 ml  Output 2825 ml  Net -1762.98 ml   Filed Weights   08/05/20 0514 08/06/20 0322 08/07/20 0029  Weight: 81.9 kg 81.2 kg 87.9 kg    Examination:  General exam: Appears calm and comfortable, on nasal cannula, very drowsy but arousable with verbal command Respiratory system: Diminished breath sound noted on the bases.  No wheezing or  rhonchi. Cardiovascular system: S1 & S2 heard, RRR. No JVD, murmurs, rubs, gallops or clicks.  Bilateral 2+ pitting edema positive Gastrointestinal system: Abdomen is nondistended, soft and nontender. No organomegaly or masses felt. Normal bowel sounds heard. Central nervous system: See and following commands intermittently, oriented to time and person. Extremities: Symmetric 5 x 5 power. Skin: No rashes, lesions or ulcers  Data Reviewed: I have personally reviewed following labs and imaging studies  CBC: Recent Labs  Lab 08/04/20 1440 08/05/20 0546 08/06/20 0353 08/07/20 0311  WBC 4.3 4.5 4.4 5.3  HGB 16.2 16.1 16.4 15.2  HCT 54.4* 54.3* 54.3* 50.4  MCV 102.3* 103.6* 101.1* 102.0*  PLT 247 239 222 190   Basic Metabolic Panel: Recent Labs  Lab 08/04/20 1440 08/05/20 0546 08/06/20 0353 08/07/20 0311  NA 135 137 137 136  K 4.4 4.3 4.7 4.5  CL 99 97* 93* 94*  CO2 27 30 34* 33*  GLUCOSE 101* 84 113* 137*  BUN 27* 25* 23 30*  CREATININE 1.25* 1.12 0.94 1.04  CALCIUM 8.8* 9.1 9.2 8.6*  MG  --   --   --  1.8   GFR: Estimated Creatinine Clearance: 74.7 mL/min (by C-G formula based on SCr of 1.04 mg/dL). Liver Function Tests: Recent Labs  Lab 08/06/20 0353 08/07/20 0951  AST 30 29  ALT 15 15  ALKPHOS 66 60  BILITOT 2.1* 1.3*  PROT 7.8 7.1  ALBUMIN 3.1* 2.9*   No results for input(s): LIPASE, AMYLASE in the last 168 hours. Recent Labs  Lab 08/07/20 0733  AMMONIA 75*   Coagulation Profile: Recent Labs  Lab 08/04/20 2055  INR 1.1   Cardiac Enzymes: No results for input(s): CKTOTAL, CKMB, CKMBINDEX, TROPONINI in the last 168 hours. BNP (last 3 results) No results for input(s): PROBNP in the last 8760 hours. HbA1C: Recent Labs    08/05/20 0546  HGBA1C 6.2*   CBG: Recent Labs  Lab 08/07/20 0343  GLUCAP 155*   Lipid Profile: No results for input(s): CHOL, HDL, LDLCALC, TRIG, CHOLHDL, LDLDIRECT in the last 72 hours. Thyroid Function Tests: Recent  Labs    08/06/20 0353  TSH 0.858   Anemia Panel: Recent Labs    08/06/20 0353  VITAMINB12 432  FOLATE 7.8   Sepsis Labs: Recent Labs  Lab 08/07/20 1002  LATICACIDVEN 1.1    Recent Results (from the past 240 hour(s))  SARS CORONAVIRUS 2 (TAT 6-24 HRS) Nasopharyngeal Nasopharyngeal Swab     Status: None   Collection Time: 08/05/20  1:03 AM   Specimen: Nasopharyngeal Swab  Result Value Ref Range Status   SARS Coronavirus 2 NEGATIVE NEGATIVE Final    Comment: (NOTE) SARS-CoV-2 target nucleic acids are NOT DETECTED.  The SARS-CoV-2 RNA is generally detectable in upper and lower respiratory specimens during the acute phase of infection. Negative results do not preclude SARS-CoV-2 infection, do not rule out co-infections with  other pathogens, and should not be used as the sole basis for treatment or other patient management decisions. Negative results must be combined with clinical observations, patient history, and epidemiological information. The expected result is Negative.  Fact Sheet forHairSlick.no38098/download  Fact Sheet for Healthcare Providers: quierodirigir.com  This test is not yet approved or cleared by the [place] FDA and  has been authorized for detection and/or diagnosis of SARS-CoV-2 by FDA under an Emergency Use Authorization (EUA). This EUA will remain  in effect (meaning this test can be used) for the duration of the COVID-19 declaration under Se ction 564(b)(1) of the Act, 21 U.S.C. section 360bbb-3(b)(1), unless the authorization is terminated or revoked sooner.  Performed at [facility], [address], [place], [place] [address]       Radiology Studies: CT HEAD WO CONTRAST  Result Date: 08/07/2020 CLINICAL DATA:  Mental status change with unknown cause EXAM: CT HEAD WITHOUT CONTRAST TECHNIQUE: Contiguous axial images were obtained from the base of the skull through the vertex  without intravenous contrast. COMPARISON:  04/20/2017 FINDINGS: Brain: No evidence of acute infarction, hemorrhage, hydrocephalus, extra-axial collection or mass lesion/mass effect. Vermian atrophy. There is history of alcohol abuse. Vascular: No hyperdense vessel or unexpected calcification. Skull: Normal. Negative for fracture or focal lesion. Sinuses/Orbits: No acute finding. 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 Electronically Signed   By: Marnee Spring M.D.   On: 08/07/2020 04:44   CT ANGIO CHEST PE W OR WO CONTRAST  Result Date: 08/05/2020 CLINICAL DATA:  Leg swelling for two days and shortness of breath. EXAM: CT ANGIOGRAPHY CHEST WITH CONTRAST TECHNIQUE: Multidetector CT imaging of the chest was performed using the standard protocol during bolus administration of intravenous contrast. Multiplanar CT image reconstructions and MIPs were obtained to evaluate the vascular anatomy. CONTRAST:  OMNIPAQUE IOHEXOL 350 MG/ML SOLN COMPARISON:  Same day chest radiograph and report from CT chest dated 08/27/2001. FINDINGS: Cardiovascular: Motion artifact limits the sensitivity and specificity of the exam. Given this limitation, there is satisfactory opacification of the pulmonary arteries to the segmental level. Several subsegmental filling defects are seen in the pulmonary arteries supplying the bilateral upper and the left lower lobes, consistent with pulmonary emboli. The main pulmonary artery is dilated, measuring 3.9 cm in diameter, consistent with pulmonary hypertension. Vascular calcifications are seen in the aortic arch. The heart is mildly enlarged. No pericardial effusion. Mediastinum/Nodes: No enlarged mediastinal, hilar, or axillary lymph nodes. Thyroid gland, trachea, and esophagus demonstrate no significant findings. Lungs/Pleura: Aspirated debris and bronchial wall thickening is seen in the right lower lobe.  There is no pneumothorax on either side. There are moderate bilateral pleural effusions, right greater than left, with associated atelectasis. Upper Abdomen: No acute abnormality. Musculoskeletal: Degenerative changes are seen in the spine. Review of the MIP images confirms the above findings. IMPRESSION: 1. Several subsegmental pulmonary emboli in the pulmonary arteries supplying the bilateral upper lobes and the left lower lobe. 2. Moderate bilateral pleural effusions, right greater than left, with associated atelectasis. 3. Aspirated debris and bronchial wall thickening in the right lower lobe likely reflect chronic aspiration. 4. Enlarged pulmonary artery likely representing pulmonary hypertension. Mild cardiomegaly. Aortic Atherosclerosis (ICD10-I70.0). These results were called by telephone at the time of interpretation on 08/05/2020 at 8:12 pm to provider Joen Laura, who verbally acknowledged these results. Electronically Signed   By: Romona Curls M.D.   On: 08/05/2020  20:17   VAS [place] LOWER EXTREMITY VENOUS (DVT)  Result Date: 08/06/2020  Lower Venous DVT Study Indications: Pulmonary embolism, SOB, bilateral swelling.  Comparison Study: 08-05-2020 CTA chest was positive for pulmonary embolism. Performing Technologist: Jean Rosenthal RDMS  Examination Guidelines: A complete evaluation includes B-mode imaging, spectral Doppler, color Doppler, and power Doppler as needed of all accessible portions of each vessel. Bilateral testing is considered an integral part of a complete examination. Limited examinations for reoccurring indications may be performed as noted. The reflux portion of the exam is performed with the patient in reverse Trendelenburg.  +---------+---------------+---------+-----------+----------+--------------+ RIGHT    CompressibilityPhasicitySpontaneityPropertiesThrombus Aging +---------+---------------+---------+-----------+----------+--------------+ CFV      Full           No        Yes                                 +---------+---------------+---------+-----------+----------+--------------+ SFJ      Full                                                        +---------+---------------+---------+-----------+----------+--------------+ FV Prox  Full                                                        +---------+---------------+---------+-----------+----------+--------------+ FV Mid   Full                                                        +---------+---------------+---------+-----------+----------+--------------+ FV DistalFull                                                        +---------+---------------+---------+-----------+----------+--------------+ PFV      Full                                                        +---------+---------------+---------+-----------+----------+--------------+ POP      Full           No       Yes                                 +---------+---------------+---------+-----------+----------+--------------+ PTV      Full                                                        +---------+---------------+---------+-----------+----------+--------------+ PERO     Full                                                        +---------+---------------+---------+-----------+----------+--------------+   +---------+---------------+---------+-----------+----------+--------------+  LEFT     CompressibilityPhasicitySpontaneityPropertiesThrombus Aging +---------+---------------+---------+-----------+----------+--------------+ CFV      Full           No       Yes                                 +---------+---------------+---------+-----------+----------+--------------+ SFJ      Full                                                        +---------+---------------+---------+-----------+----------+--------------+ FV Prox  Full                                                         +---------+---------------+---------+-----------+----------+--------------+ FV Mid   Full                                                        +---------+---------------+---------+-----------+----------+--------------+ FV DistalFull                                                        +---------+---------------+---------+-----------+----------+--------------+ PFV      Full                                                        +---------+---------------+---------+-----------+----------+--------------+ POP      Full           No       Yes                                 +---------+---------------+---------+-----------+----------+--------------+ PTV      Full                                                        +---------+---------------+---------+-----------+----------+--------------+ PERO     Full                                                        +---------+---------------+---------+-----------+----------+--------------+     Summary: RIGHT: - There is no evidence of deep vein thrombosis in the lower extremity.  - No cystic structure found in the popliteal fossa.  - Pulsatile waveforms consistent with elevated right heart pressures.  LEFT: - There is no  evidence of deep vein thrombosis in the lower extremity.  - No cystic structure found in the popliteal fossa.  - Pulsatile waveforms consistent with elevated right heart pressures.  *See table(s) above for measurements and observations. Electronically signed by Coral ElseVance Brabham MD on 08/06/2020 at 9:47:18 PM.    Final     Scheduled Meds: . acetaZOLAMIDE  500 mg Oral Q12H  . carvedilol  25 mg Oral BID WC  . furosemide  80 mg Intravenous BID  . lactulose  20 g Oral BID  . losartan  100 mg Oral Daily  . sodium chloride flush  3 mL Intravenous Q12H   Continuous Infusions: . sodium chloride    . heparin 1,700 Units/hr (08/07/20 0731)     LOS: 3 days   Time spent: 35 minutes   Janard Culp Estill Cotta Steaven Wholey, MD [facility]  If 7PM-7AM, please contact night-coverage [email or web address] 08/07/2020, 1:21 PM

## 2020-08-07 NOTE — Progress Notes (Signed)
ANTICOAGULATION CONSULT NOTE - Follow Up Consult  Pharmacy Consult for heparin Indication: pulmonary embolus  Labs: Recent Labs    08/04/20 1440 08/04/20 2003 08/04/20 2055 08/05/20 0546 08/06/20 0353 08/06/20 1350 08/06/20 2230  HGB 16.2  --   --  16.1 16.4  --   --   HCT 54.4*  --   --  54.3* 54.3*  --   --   PLT 247  --   --  239 222  --   --   LABPROT  --   --  14.0  --   --   --   --   INR  --   --  1.1  --   --   --   --   HEPARINUNFRC  --   --   --   --  <0.10* 0.14* 0.44  CREATININE 1.25*  --   --  1.12 0.94  --   --   TROPONINIHS  --  44*  --   --   --   --   --     Assessment/Plan:  70yo male therapeutic on heparin after rate change. Will continue gtt at current rate and confirm stable with am labs.   Vernard Gambles, PharmD, BCPS  08/07/2020,12:01 AM

## 2020-08-07 NOTE — Evaluation (Signed)
Clinical/Bedside Swallow Evaluation Patient Details  Name: Mitchell King MRN: [id number] Date of Birth: February 18, 1951  Today's Date: 08/07/2020 Time: SLP Start Time (ACUTE ONLY): 1500 SLP Stop Time (ACUTE ONLY): 1520 SLP Time Calculation (min) (ACUTE ONLY): 20 min  Past Medical History:  Past Medical History:  Diagnosis Date  . CHF (congestive heart failure) (HCC)   . COPD (chronic obstructive pulmonary disease) (HCC)   . Diabetes mellitus without complication (HCC)   . Dyspnea   . Hypertension    Past Surgical History:  Past Surgical History:  Procedure Laterality Date  . NO PAST SURGERIES     HPI:  Mitchell King is a 70 y.o. M with COPD not on home O2, DM and HTN no longer on medication who presented with at least 1 week progressive LE edema and dyspnea on exertion not responsive to home albuterol. Ammonia level elevated. Patient suspected to be in liver failure from hepatic congestion.   Assessment / Plan / Recommendation Clinical Impression  Pt currently presents wtih a cognitive based dysphagia. Mentation fluctuated greatly during clinical interaction with SLP. Pt would at times open eyes and respond to communication appropriately and sequence swallowing. Pt also with instances of eyes closed without appropriate manipulation of boluses or swallow initiation (one instance of extracting puree from oral cavity due to oral holding). Per RN and chart review, pt with suspected liver failure and elevated ammonia level likely contributing to fluctuating mental status. Pt with upper dentures only, no lower dentures noted. Prolonged mastication exhibited with solids and mild oral residuals. No overt s/sx of aspiration of POs administered when pt mentation permitted. Recommend dysphagia 3 (mechanical soft) and thin liquids with meds in puree. Hold POs if mentation is poor. SLP to continue to follow.  SLP Visit Diagnosis: Dysphagia, oral phase (R13.11);Dysphagia, unspecified (R13.10)    Aspiration  Risk  Mild aspiration risk;Moderate aspiration risk    Diet Recommendation   Dysphagia 3 (mechanical soft) thin liquids   Medication Administration: Whole meds with puree    Other  Recommendations Oral Care Recommendations: Oral care BID   Follow up Recommendations 24 hour supervision/assistance      Frequency and Duration min 1 x/week  1 week       Prognosis Prognosis for Safe Diet Advancement: Fair Barriers to Reach Goals: Time post onset      Swallow Study   General Date of Onset: 08/04/20 HPI: Mitchell King is a 70 y.o. M with COPD not on home O2, DM and HTN no longer on medication who presented with at least 1 week progressive LE edema and dyspnea on exertion not responsive to home albuterol. Ammonia level elevated. Patient suspected to be in liver failure from hepatic congestion. Type of Study: Bedside Swallow Evaluation Previous Swallow Assessment: none on file Diet Prior to this Study: Regular;Thin liquids Temperature Spikes Noted: No Respiratory Status: Nasal cannula History of Recent Intubation: No Behavior/Cognition: Requires cueing;Lethargic/Drowsy;Other (Comment) (fluctuating mentation, in and out of alertness) Oral Cavity Assessment: Dried secretions Oral Care Completed by SLP: Yes Oral Cavity - Dentition: Dentures, top;Other (Comment) (edentulous lower, no lower dentures noted at bedside) Vision: Impaired for self-feeding Self-Feeding Abilities: Total assist Patient Positioning: Upright in bed Baseline Vocal Quality: Low vocal intensity Volitional Cough: Cognitively unable to elicit Volitional Swallow: Unable to elicit    Oral/Motor/Sensory Function Overall Oral Motor/Sensory Function: Generalized oral weakness   Ice Chips Ice chips: Impaired Presentation: Spoon Oral Phase Impairments: Reduced labial seal;Impaired mastication Oral Phase Functional Implications: Prolonged oral transit;Oral holding  Pharyngeal Phase Impairments: Suspected delayed  Swallow;Multiple swallows   Thin Liquid Thin Liquid: Impaired Presentation: Cup;Straw Oral Phase Impairments: Reduced labial seal;Impaired mastication Oral Phase Functional Implications: Prolonged oral transit Pharyngeal  Phase Impairments: Suspected delayed Swallow;Multiple swallows    Nectar Thick Nectar Thick Liquid: Not tested   Honey Thick Honey Thick Liquid: Not tested   Puree Puree: Impaired Presentation: Spoon Oral Phase Impairments: Poor awareness of bolus;Other (comment) (bolus extracted from oral cavity) Oral Phase Functional Implications: Oral holding Pharyngeal Phase Impairments: Unable to trigger swallow   Solid     Solid: Impaired Presentation: Spoon Oral Phase Impairments: Reduced lingual movement/coordination;Impaired mastication Oral Phase Functional Implications: Prolonged oral transit;Impaired mastication Pharyngeal Phase Impairments: Suspected delayed Swallow;Multiple swallows      Ardyth Gal MA, CCC-SLP Acute Rehabilitation Services   08/07/2020,3:29 PM

## 2020-08-07 NOTE — Progress Notes (Signed)
Cardiology Progress Note  Patient ID: Mitchell King MRN: [id number] DOB: [birth year] Date of Encounter: 08/07/2020  Primary Cardiologist: No primary care provider on file.  Subjective   Chief Complaint: Confused  HPI: Very drowsy this morning. Confused. CT had overnight negative. Ammonia level elevated. Suspect he is in liver failure from hepatic congestion. LFTs pending. Liver ultrasound ordered.  ROS:  All other ROS reviewed and negative. Pertinent positives noted in the HPI.     Inpatient Medications  Scheduled Meds: . acetaZOLAMIDE  500 mg Oral Q12H  . carvedilol  25 mg Oral BID WC  . furosemide  80 mg Intravenous BID  . losartan  100 mg Oral Daily  . sodium chloride flush  3 mL Intravenous Q12H   Continuous Infusions: . sodium chloride    . heparin 1,700 Units/hr (08/07/20 0731)   PRN Meds: sodium chloride, acetaminophen, albuterol, guaifenesin, hydrALAZINE, ondansetron (ZOFRAN) IV, sodium chloride flush   Vital Signs   Vitals:   08/06/20 1622 08/06/20 1932 08/07/20 0029 08/07/20 0344  BP: 139/83 104/61 109/69 (!) 159/82  Pulse: 62 62 60 62  Resp: Temp: 98.3 F (36.8 C) 97.9 F (36.6 C) (!) 97.4 F (36.3 C) (!) 97.4 F (36.3 C)  TempSrc: Oral Oral Oral Oral  SpO2: 95% (!) 89% 99% 95%  Weight:   87.9 kg   Height:        Intake/Output Summary (Last 24 hours) at 08/07/2020 0947 Last data filed at 08/07/2020 0900 Gross per 24 hour  Intake 1212.02 ml  Output 2575 ml  Net -1362.98 ml   Last 3 Weights 08/07/2020 08/06/2020 08/05/2020  Weight (lbs) 193 lb 11.2 oz 179 lb 0.2 oz 180 lb 8.9 oz  Weight (kg) 87.862 kg 81.2 kg 81.9 kg      Telemetry  Overnight telemetry shows sinus rhythm with heart rate in the 60s, which I personally reviewed.   Physical Exam   Vitals:   08/06/20 1622 08/06/20 1932 08/07/20 0029 08/07/20 0344  BP: 139/83 104/61 109/69 (!) 159/82  Pulse: 62 62 60 62  Resp: Temp: 98.3 F (36.8 C) 97.9 F (36.6 C) (!)  97.4 F (36.3 C) (!) 97.4 F (36.3 C)  TempSrc: Oral Oral Oral Oral  SpO2: 95% (!) 89% 99% 95%  Weight:   87.9 kg   Height:         Intake/Output Summary (Last 24 hours) at 08/07/2020 0947 Last data filed at 08/07/2020 0900 Gross per 24 hour  Intake 1212.02 ml  Output 2575 ml  Net -1362.98 ml    Last 3 Weights 08/07/2020 08/06/2020 08/05/2020  Weight (lbs) 193 lb 11.2 oz 179 lb 0.2 oz 180 lb 8.9 oz  Weight (kg) 87.862 kg 81.2 kg 81.9 kg    Body mass index is 25.56 kg/m.  General: Drowsy, confused Head: Atraumatic, normal size  Eyes: PEERLA, EOMI  Neck: Supple, JVD 17 cm of water Endocrine: No thryomegaly Cardiac: Normal S1, S2; RRR; no murmurs, rubs, or gallops Lungs: Diminished breath sounds bilaterally Abd: Distended abdomen Ext: 1+ pitting edema Musculoskeletal: No deformities, BUE and BLE strength normal and equal Skin: Warm and dry, no rashes   Neuro: Awake, drowsy, oriented to person only  Labs  High Sensitivity Troponin:   Recent Labs  Lab 08/04/20 2003  TROPONINIHS 44*     Cardiac EnzymesNo results for input(s): TROPONINI in the last 168 hours. No results for input(s): TROPIPOC in the last 168 hours.  Chemistry Recent Labs  Lab 08/05/20 0546 08/06/20 0353 08/07/20 0311  NA 137 137 136  K 4.3 4.7 4.5  CL 97* 93* 94*  CO2 30 34* 33*  GLUCOSE 84 113* 137*  BUN 25* 23 30*  CREATININE 1.12 0.94 1.04  CALCIUM 9.1 9.2 8.6*  PROT  --  7.8  --   ALBUMIN  --  3.1*  --   AST  --  30  --   ALT  --  15  --   ALKPHOS  --  66  --   BILITOT  --  2.1*  --   GFRNONAA >60 >60 >60  ANIONGAP Hematology Recent Labs  Lab 08/05/20 0546 08/06/20 0353 08/07/20 0311  WBC 4.5 4.4 5.3  RBC 5.24 5.37 4.94  HGB 16.1 16.4 15.2  HCT 54.3* 54.3* 50.4  MCV 103.6* 101.1* 102.0*  MCH 30.7 30.5 30.8  MCHC 29.7* 30.2 30.2  RDW 14.4 14.2 14.0  PLT 239 222 190   BNP Recent Labs  Lab 08/04/20 2003  BNP 690.9*    DDimer  Recent Labs  Lab 08/05/20 2037   DDIMER 2.46*     Radiology  CT HEAD WO CONTRAST  Result Date: 08/07/2020 CLINICAL DATA:  Mental status change with unknown cause EXAM: CT HEAD WITHOUT CONTRAST TECHNIQUE: Contiguous axial images were obtained from the base of the skull through the vertex without intravenous contrast. COMPARISON:  04/20/2017 FINDINGS: Brain: No evidence of acute infarction, hemorrhage, hydrocephalus, extra-axial collection or mass lesion/mass effect. Vermian atrophy. There is history of alcohol abuse. Vascular: No hyperdense vessel or unexpected calcification. Skull: Normal. Negative for fracture or focal lesion. Sinuses/Orbits: No acute finding. 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 Electronically Signed   By: Marnee Spring M.D.   On: 08/07/2020 04:44   CT ANGIO CHEST PE W OR WO CONTRAST  Result Date: 08/05/2020 CLINICAL DATA:  Leg swelling for two days and shortness of breath. EXAM: CT ANGIOGRAPHY CHEST WITH CONTRAST TECHNIQUE: Multidetector CT imaging of the chest was performed using the standard protocol during bolus administration of intravenous contrast. Multiplanar CT image reconstructions and MIPs were obtained to evaluate the vascular anatomy. CONTRAST:  OMNIPAQUE IOHEXOL 350 MG/ML SOLN COMPARISON:  Same day chest radiograph and report from CT chest dated 08/27/2001. FINDINGS: Cardiovascular: Motion artifact limits the sensitivity and specificity of the exam. Given this limitation, there is satisfactory opacification of the pulmonary arteries to the segmental level. Several subsegmental filling defects are seen in the pulmonary arteries supplying the bilateral upper and the left lower lobes, consistent with pulmonary emboli. The main pulmonary artery is dilated, measuring 3.9 cm in diameter, consistent with pulmonary hypertension. Vascular calcifications are seen in the aortic arch. The heart is mildly enlarged. No  pericardial effusion. Mediastinum/Nodes: No enlarged mediastinal, hilar, or axillary lymph nodes. Thyroid gland, trachea, and esophagus demonstrate no significant findings. Lungs/Pleura: Aspirated debris and bronchial wall thickening is seen in the right lower lobe. There is no pneumothorax on either side. There are moderate bilateral pleural effusions, right greater than left, with associated atelectasis. Upper Abdomen: No acute abnormality. Musculoskeletal: Degenerative changes are seen in the spine. Review of the MIP images confirms the above findings. IMPRESSION: 1. Several subsegmental pulmonary emboli in the pulmonary arteries supplying the bilateral upper lobes and the left lower lobe. 2. Moderate bilateral pleural effusions, right greater than left, with associated atelectasis. 3. Aspirated debris and  bronchial wall thickening in the right lower lobe likely reflect chronic aspiration. 4. Enlarged pulmonary artery likely representing pulmonary hypertension. Mild cardiomegaly. Aortic Atherosclerosis (ICD10-I70.0). These results were called by telephone at the time of interpretation on 08/05/2020 at 8:12 pm to provider Joen Laura, who verbally acknowledged these results. Electronically Signed   By: Romona Curls M.D.   On: 08/05/2020 20:17   VAS [place] LOWER EXTREMITY VENOUS (DVT)  Result Date: 08/06/2020  Lower Venous DVT Study Indications: Pulmonary embolism, SOB, bilateral swelling.  Comparison Study: 08-05-2020 CTA chest was positive for pulmonary embolism. Performing Technologist: Jean Rosenthal RDMS  Examination Guidelines: A complete evaluation includes B-mode imaging, spectral Doppler, color Doppler, and power Doppler as needed of all accessible portions of each vessel. Bilateral testing is considered an integral part of a complete examination. Limited examinations for reoccurring indications may be performed as noted. The reflux portion of the exam is performed with the patient in reverse  Trendelenburg.  +---------+---------------+---------+-----------+----------+--------------+ RIGHT    CompressibilityPhasicitySpontaneityPropertiesThrombus Aging +---------+---------------+---------+-----------+----------+--------------+ CFV      Full           No       Yes                                 +---------+---------------+---------+-----------+----------+--------------+ SFJ      Full                                                        +---------+---------------+---------+-----------+----------+--------------+ FV Prox  Full                                                        +---------+---------------+---------+-----------+----------+--------------+ FV Mid   Full                                                        +---------+---------------+---------+-----------+----------+--------------+ FV DistalFull                                                        +---------+---------------+---------+-----------+----------+--------------+ PFV      Full                                                        +---------+---------------+---------+-----------+----------+--------------+ POP      Full           No       Yes                                 +---------+---------------+---------+-----------+----------+--------------+ PTV  Full                                                        +---------+---------------+---------+-----------+----------+--------------+ PERO     Full                                                        +---------+---------------+---------+-----------+----------+--------------+   +---------+---------------+---------+-----------+----------+--------------+ LEFT     CompressibilityPhasicitySpontaneityPropertiesThrombus Aging +---------+---------------+---------+-----------+----------+--------------+ CFV      Full           No       Yes                                  +---------+---------------+---------+-----------+----------+--------------+ SFJ      Full                                                        +---------+---------------+---------+-----------+----------+--------------+ FV Prox  Full                                                        +---------+---------------+---------+-----------+----------+--------------+ FV Mid   Full                                                        +---------+---------------+---------+-----------+----------+--------------+ FV DistalFull                                                        +---------+---------------+---------+-----------+----------+--------------+ PFV      Full                                                        +---------+---------------+---------+-----------+----------+--------------+ POP      Full           No       Yes                                 +---------+---------------+---------+-----------+----------+--------------+ PTV      Full                                                        +---------+---------------+---------+-----------+----------+--------------+  PERO     Full                                                        +---------+---------------+---------+-----------+----------+--------------+     Summary: RIGHT: - There is no evidence of deep vein thrombosis in the lower extremity.  - No cystic structure found in the popliteal fossa.  - Pulsatile waveforms consistent with elevated right heart pressures.  LEFT: - There is no evidence of deep vein thrombosis in the lower extremity.  - No cystic structure found in the popliteal fossa.  - Pulsatile waveforms consistent with elevated right heart pressures.  *See table(s) above for measurements and observations. Electronically signed by Coral Else MD on 08/06/2020 at 9:47:18 PM.    Final     Cardiac Studies  TTE 08/05/2020 1. Left ventricular ejection fraction, by estimation, is 55 to  60%. The  left ventricle has normal function. The left ventricle has no regional  wall motion abnormalities. There is mild concentric left ventricular  hypertrophy. Left ventricular diastolic  parameters were normal. There is the interventricular septum is flattened  in systole, consistent with right ventricular pressure overload.  2. Right ventricular systolic function is normal. The right ventricular  size is severely enlarged. Moderately increased right ventricular wall  thickness. There is mildly elevated pulmonary artery systolic pressure.  The estimated right ventricular  systolic pressure is 44.4 mmHg (suspect this is an underestimation due to  the faint TR jet).  3. Right atrial size was severely dilated.  4. The mitral valve is normal in structure. No evidence of mitral valve  regurgitation. No evidence of mitral stenosis.  5. The aortic valve is tricuspid. Aortic valve regurgitation is not  visualized. No aortic stenosis is present.  6. The inferior vena cava is dilated in size with <50% respiratory  variability, suggesting right atrial pressure of 15 mmHg.   Patient Profile  Mitchell King is a 70 y.o. male with hypertension, COPD, diabetes, former tobacco abuse who was admitted on 08/04/2020 with right heart failure and found to have several pulmonary emboli.  Assessment & Plan   1. Right heart failure/cor pulmonale -Echo shows severely dilated RV with moderately reduced function. Found to have multiple subsegmental PEs in several lobes. -Suspect his right heart failure is related to PEs. -Still grossly volume overloaded. Adequate urine output with 80 mg IV twice daily. Creatinine coming down. Likely will increase him to 120 IV later this afternoon. We'll see how he does. -Net -8.9 L since admission. -I will defer work-up for idiopathic pulmonary hypertension given his numerous PEs. I think we have an answer.  2. Hepatic congestion/hyperammonemia -He is more  confused. CT had negative overnight. Laboratory data demonstrate elevated ammonia at 75. Repeat LFTs ordered. Hepatic ultrasound ordered. -I suspect this is acute liver injury in the setting of hepatic congestion. Continue with aggressive diuresis. Supportive care for elevated ammonia per hospital medicine.  3. Metabolic alkalosis secondary to diuresis -Diamox 500 mg twice daily again today.  4. Hypertension -Coreg 25 twice a day, losartan 100 mg daily, hydralazine as needed.  For questions or updates, please contact [facility] Please consult [email or web address] for contact info under   Time Spent with Patient: I have spent a total of 35 minutes with patient reviewing hospital notes, telemetry, EKGs, labs and examining  the patient as well as establishing an assessment and plan that was discussed with the patient.  > 50% of time was spent in direct patient care.    Signed, Lenna GilfordWesley T. Flora Lipps'Neal, MD [facility]  [facility]  08/07/2020 9:47 AM

## 2020-08-07 NOTE — Progress Notes (Signed)
Patient had a change in mental status presentation, patients RN called rapid response and I paged on call physician for triad. Awaiting orders. Will continue to monitor.

## 2020-08-07 NOTE — Progress Notes (Addendum)
ANTICOAGULATION CONSULT NOTE - Follow Up Consult  Pharmacy Consult for Heparin Indication: pulmonary embolus  No Known Allergies  Patient Measurements: Height: 6\' 1"  (185.4 cm) Weight: 87.9 kg (193 lb 11.2 oz) (scale c) IBW/kg (Calculated) : 79.9  Vital Signs: Temp: 97.4 F (36.3 C) (02/17 0344) Temp Source: Oral (02/17 0344) BP: 159/82 (02/17 0344) Pulse Rate: 62 (02/17 0344)  Labs: Recent Labs    08/04/20 2003 08/04/20 2055 08/05/20 0546 08/05/20 0546 08/06/20 0353 08/06/20 1350 08/06/20 2230 08/07/20 0311  HGB  --   --  16.1  --  16.4  --   --  15.2  HCT  --   --  54.3*  --  54.3*  --   --  50.4  PLT  --   --  239  --  222  --   --  190  LABPROT  --  14.0  --   --   --   --   --   --   INR  --  1.1  --   --   --   --   --   --   HEPARINUNFRC  --   --   --    < > <0.10* 0.14* 0.44 0.35  CREATININE  --   --  1.12  --  0.94  --   --  1.04  TROPONINIHS 44*  --   --   --   --   --   --   --    < > = values in this interval not displayed.    Estimated Creatinine Clearance: 74.7 mL/min (by C-G formula based on SCr of 1.04 mg/dL).  Assessment: 70 year old male to begin heparin for several scattered small PE's. No AC PTA.  Heparin level came back therapeutic at 0.35, on 1700 units/hr. Hgb 15.2, plt 190. No s/sx of bleeding or infusion issues. Mental status stable since rapid response event earlier this morning.  Goal of Therapy:  Heparin level 0.3-0.7 units/ml Monitor platelets by anticoagulation protocol: Yes   Plan:  Continue heparin drip to 1700 units / hr Daily heparin level, CBC F/u plans for PO AC  66, PharmD, BCCCP Clinical Pharmacist  Phone: [phone] 08/07/2020 7:37 AM  Please check AMION for all [facility] phone numbers After 10:00 PM, call Main Pharmacy [phone]

## 2020-08-08 ENCOUNTER — Inpatient Hospital Stay (HOSPITAL_COMMUNITY): Payer: Medicare Other

## 2020-08-08 ENCOUNTER — Encounter (HOSPITAL_COMMUNITY): Payer: Self-pay | Admitting: Internal Medicine

## 2020-08-08 DIAGNOSIS — J449 Chronic obstructive pulmonary disease, unspecified: Secondary | ICD-10-CM | POA: Diagnosis not present

## 2020-08-08 DIAGNOSIS — J9601 Acute respiratory failure with hypoxia: Secondary | ICD-10-CM

## 2020-08-08 DIAGNOSIS — I10 Essential (primary) hypertension: Secondary | ICD-10-CM | POA: Diagnosis not present

## 2020-08-08 DIAGNOSIS — J9602 Acute respiratory failure with hypercapnia: Secondary | ICD-10-CM

## 2020-08-08 DIAGNOSIS — I2609 Other pulmonary embolism with acute cor pulmonale: Secondary | ICD-10-CM | POA: Diagnosis not present

## 2020-08-08 DIAGNOSIS — I50811 Acute right heart failure: Secondary | ICD-10-CM | POA: Diagnosis not present

## 2020-08-08 DIAGNOSIS — I509 Heart failure, unspecified: Secondary | ICD-10-CM | POA: Diagnosis not present

## 2020-08-08 DIAGNOSIS — Z9911 Dependence on respirator [ventilator] status: Secondary | ICD-10-CM

## 2020-08-08 LAB — CBC
HCT: 50.4 % (ref 39.0–52.0)
Hemoglobin: 15.4 g/dL (ref 13.0–17.0)
MCH: 31.1 pg (ref 26.0–34.0)
MCHC: 30.6 g/dL (ref 30.0–36.0)
MCV: 101.8 fL — ABNORMAL HIGH (ref 80.0–100.0)
Platelets: 205 10*3/uL (ref 150–400)
RBC: 4.95 MIL/uL (ref 4.22–5.81)
RDW: 14.1 % (ref 11.5–15.5)
WBC: 3.8 10*3/uL — ABNORMAL LOW (ref 4.0–10.5)
nRBC: 0 % (ref 0.0–0.2)

## 2020-08-08 LAB — GLUCOSE, CAPILLARY
Glucose-Capillary: 154 mg/dL — ABNORMAL HIGH (ref 70–99)
Glucose-Capillary: 68 mg/dL — ABNORMAL LOW (ref 70–99)
Glucose-Capillary: 81 mg/dL (ref 70–99)

## 2020-08-08 LAB — POCT I-STAT 7, (LYTES, BLD GAS, ICA,H+H)
Acid-Base Excess: 10 mmol/L — ABNORMAL HIGH (ref 0.0–2.0)
Acid-Base Excess: 12 mmol/L — ABNORMAL HIGH (ref 0.0–2.0)
Bicarbonate: 41.6 mmol/L — ABNORMAL HIGH (ref 20.0–28.0)
Bicarbonate: 38.7 mmol/L — ABNORMAL HIGH (ref 20.0–28.0)
Calcium, Ion: 1.24 mmol/L (ref 1.15–1.40)
Calcium, Ion: 1.22 mmol/L (ref 1.15–1.40)
HCT: 54 % — ABNORMAL HIGH (ref 39.0–52.0)
HCT: 52 % (ref 39.0–52.0)
Hemoglobin: 18.4 g/dL — ABNORMAL HIGH (ref 13.0–17.0)
Hemoglobin: 17.7 g/dL — ABNORMAL HIGH (ref 13.0–17.0)
O2 Saturation: 93 %
O2 Saturation: 96 %
Patient temperature: 98
Patient temperature: 97.3
Potassium: 4.1 mmol/L (ref 3.5–5.1)
Potassium: 3.8 mmol/L (ref 3.5–5.1)
Sodium: 138 mmol/L (ref 135–145)
Sodium: 137 mmol/L (ref 135–145)
TCO2: 44 mmol/L — ABNORMAL HIGH (ref 22–32)
TCO2: 40 mmol/L — ABNORMAL HIGH (ref 22–32)
pCO2 arterial: 83.8 mmHg (ref 32.0–48.0)
pCO2 arterial: 54.7 mmHg — ABNORMAL HIGH (ref 32.0–48.0)
pH, Arterial: 7.302 — ABNORMAL LOW (ref 7.350–7.450)
pH, Arterial: 7.455 — ABNORMAL HIGH (ref 7.350–7.450)
pO2, Arterial: 77 mmHg — ABNORMAL LOW (ref 83.0–108.0)
pO2, Arterial: 81 mmHg — ABNORMAL LOW (ref 83.0–108.0)

## 2020-08-08 LAB — COMPREHENSIVE METABOLIC PANEL
ALT: 16 U/L (ref 0–44)
AST: 28 U/L (ref 15–41)
Albumin: 3.1 g/dL — ABNORMAL LOW (ref 3.5–5.0)
Alkaline Phosphatase: 62 U/L (ref 38–126)
Anion gap: 7 (ref 5–15)
BUN: 35 mg/dL — ABNORMAL HIGH (ref 8–23)
CO2: 39 mmol/L — ABNORMAL HIGH (ref 22–32)
Calcium: 9 mg/dL (ref 8.9–10.3)
Chloride: 91 mmol/L — ABNORMAL LOW (ref 98–111)
Creatinine, Ser: 1.2 mg/dL (ref 0.61–1.24)
GFR, Estimated: >60 mL/min (ref >60)
Glucose, Bld: 121 mg/dL — ABNORMAL HIGH (ref 70–99)
Potassium: 4 mmol/L (ref 3.5–5.1)
Sodium: 137 mmol/L (ref 135–145)
Total Bilirubin: 1.9 mg/dL — ABNORMAL HIGH (ref 0.3–1.2)
Total Protein: 7.4 g/dL (ref 6.5–8.1)

## 2020-08-08 LAB — BLOOD GAS, ARTERIAL
Acid-Base Excess: 11.8 mmol/L — ABNORMAL HIGH (ref 0.0–2.0)
Acid-Base Excess: 12 mmol/L — ABNORMAL HIGH (ref 0.0–2.0)
Bicarbonate: 38.4 mmol/L — ABNORMAL HIGH (ref 20.0–28.0)
Bicarbonate: 39.3 mmol/L — ABNORMAL HIGH (ref 20.0–28.0)
Drawn by: 5269
FIO2: 36
FIO2: 70
O2 Saturation: 86.8 %
O2 Saturation: 99 %
Patient temperature: 36.8
Patient temperature: 37
pCO2 arterial: 79.7 mmHg (ref 32.0–48.0)
pCO2 arterial: 92.3 mmHg (ref 32.0–48.0)
pH, Arterial: 7.303 — ABNORMAL LOW (ref 7.350–7.450)
pH, Arterial: 7.253 — ABNORMAL LOW (ref 7.350–7.450)
pO2, Arterial: 57.2 mmHg — ABNORMAL LOW (ref 83.0–108.0)
pO2, Arterial: 198 mmHg — ABNORMAL HIGH (ref 83.0–108.0)

## 2020-08-08 LAB — BRAIN NATRIURETIC PEPTIDE: B Natriuretic Peptide: 781.2 pg/mL — ABNORMAL HIGH (ref 0.0–100.0)

## 2020-08-08 LAB — MAGNESIUM: Magnesium: 2.2 mg/dL (ref 1.7–2.4)

## 2020-08-08 LAB — HEPARIN LEVEL (UNFRACTIONATED): Heparin Unfractionated: 0.54 IU/mL (ref 0.30–0.70)

## 2020-08-08 LAB — MRSA PCR SCREENING: MRSA by PCR: NEGATIVE

## 2020-08-08 LAB — PROTIME-INR
INR: 1.1 (ref 0.8–1.2)
Prothrombin Time: 14 seconds (ref 11.4–15.2)

## 2020-08-08 LAB — AMMONIA: Ammonia: 30 umol/L (ref 9–35)

## 2020-08-08 MED ORDER — ARFORMOTEROL TARTRATE 15 MCG/2ML IN NEBU
15.0000 ug | INHALATION_SOLUTION | Freq: Two times a day (BID) | RESPIRATORY_TRACT | Status: DC
  Administered 2020-08-08 – 2020-08-16 (×17): 15 ug via RESPIRATORY_TRACT
  Filled 2020-08-08 (×17): qty 2

## 2020-08-08 MED ORDER — ORAL CARE MOUTH RINSE
15.0000 mL | OROMUCOSAL | Status: DC
  Administered 2020-08-08 – 2020-08-15 (×53): 15 mL via OROMUCOSAL

## 2020-08-08 MED ORDER — MIDAZOLAM HCL 2 MG/2ML IJ SOLN
INTRAMUSCULAR | Status: AC
  Filled 2020-08-08: qty 2

## 2020-08-08 MED ORDER — ACETAZOLAMIDE ER 500 MG PO CP12
500.0000 mg | ORAL_CAPSULE | Freq: Two times a day (BID) | ORAL | Status: DC
  Filled 2020-08-08 (×2): qty 1

## 2020-08-08 MED ORDER — INSULIN ASPART 100 UNIT/ML ~~LOC~~ SOLN
2.0000 [IU] | SUBCUTANEOUS | Status: DC
  Administered 2020-08-09 – 2020-08-10 (×2): 2 [IU] via SUBCUTANEOUS
  Administered 2020-08-10: 4 [IU] via SUBCUTANEOUS
  Administered 2020-08-10 – 2020-08-13 (×8): 2 [IU] via SUBCUTANEOUS
  Administered 2020-08-13 (×3): 4 [IU] via SUBCUTANEOUS
  Administered 2020-08-14 (×2): 2 [IU] via SUBCUTANEOUS
  Administered 2020-08-14: 4 [IU] via SUBCUTANEOUS
  Administered 2020-08-15: 2 [IU] via SUBCUTANEOUS
  Administered 2020-08-15 (×2): 4 [IU] via SUBCUTANEOUS
  Administered 2020-08-16: 2 [IU] via SUBCUTANEOUS

## 2020-08-08 MED ORDER — CARVEDILOL 12.5 MG PO TABS: 12.5000 mg | ORAL_TABLET | Freq: Two times a day (BID) | ORAL | Status: DC

## 2020-08-08 MED ORDER — PANTOPRAZOLE SODIUM 40 MG IV SOLR
40.0000 mg | Freq: Every day | INTRAVENOUS | Status: DC
  Administered 2020-08-09: 40 mg via INTRAVENOUS
  Filled 2020-08-08: qty 40

## 2020-08-08 MED ORDER — FENTANYL BOLUS VIA INFUSION
25.0000 ug | INTRAVENOUS | Status: DC | PRN
  Administered 2020-08-09 (×4): 25 ug via INTRAVENOUS
  Filled 2020-08-08: qty 25

## 2020-08-08 MED ORDER — FUROSEMIDE 10 MG/ML IJ SOLN
80.0000 mg | Freq: Two times a day (BID) | INTRAMUSCULAR | Status: DC
  Administered 2020-08-08 – 2020-08-12 (×9): 80 mg via INTRAVENOUS
  Filled 2020-08-08 (×10): qty 8

## 2020-08-08 MED ORDER — DEXTROSE 50 % IV SOLN
INTRAVENOUS | Status: AC
  Administered 2020-08-08: 50 mL
  Filled 2020-08-08: qty 50

## 2020-08-08 MED ORDER — HYDRALAZINE HCL 20 MG/ML IJ SOLN: 10.0000 mg | INTRAMUSCULAR | Status: DC | PRN

## 2020-08-08 MED ORDER — REVEFENACIN 175 MCG/3ML IN SOLN
175.0000 ug | Freq: Every day | RESPIRATORY_TRACT | Status: DC
  Administered 2020-08-08 – 2020-08-16 (×9): 175 ug via RESPIRATORY_TRACT
  Filled 2020-08-08 (×10): qty 3

## 2020-08-08 MED ORDER — IPRATROPIUM-ALBUTEROL 0.5-2.5 (3) MG/3ML IN SOLN
RESPIRATORY_TRACT | Status: AC
  Administered 2020-08-08: 3 mL
  Filled 2020-08-08: qty 3

## 2020-08-08 MED ORDER — NOREPINEPHRINE 4 MG/250ML-% IV SOLN
INTRAVENOUS | Status: AC
  Filled 2020-08-08: qty 250

## 2020-08-08 MED ORDER — ROCURONIUM BROMIDE 50 MG/5ML IV SOLN
100.0000 mg | Freq: Once | INTRAVENOUS | Status: DC
  Filled 2020-08-08: qty 10

## 2020-08-08 MED ORDER — IPRATROPIUM-ALBUTEROL 0.5-2.5 (3) MG/3ML IN SOLN
3.0000 mL | RESPIRATORY_TRACT | Status: DC | PRN
  Administered 2020-08-08: 3 mL via RESPIRATORY_TRACT
  Filled 2020-08-08: qty 3

## 2020-08-08 MED ORDER — DOCUSATE SODIUM 50 MG/5ML PO LIQD
100.0000 mg | Freq: Two times a day (BID) | ORAL | Status: DC
  Administered 2020-08-08 – 2020-08-10 (×4): 100 mg
  Filled 2020-08-08 (×4): qty 10

## 2020-08-08 MED ORDER — FENTANYL CITRATE (PF) 100 MCG/2ML IJ SOLN: 25.0000 ug | Freq: Once | INTRAMUSCULAR | Status: DC

## 2020-08-08 MED ORDER — POLYETHYLENE GLYCOL 3350 17 G PO PACK
17.0000 g | PACK | Freq: Every day | ORAL | Status: DC
  Administered 2020-08-09 – 2020-08-10 (×2): 17 g
  Filled 2020-08-08 (×3): qty 1

## 2020-08-08 MED ORDER — ROCURONIUM BROMIDE 10 MG/ML (PF) SYRINGE
PREFILLED_SYRINGE | INTRAVENOUS | Status: AC
  Administered 2020-08-08: 100 mg
  Filled 2020-08-08: qty 10

## 2020-08-08 MED ORDER — ACETAZOLAMIDE 250 MG PO TABS
8.0000 mg/kg | ORAL_TABLET | Freq: Every day | ORAL | Status: DC
  Administered 2020-08-08 – 2020-08-10 (×3): 625 mg
  Filled 2020-08-08 (×3): qty 3

## 2020-08-08 MED ORDER — CHLORHEXIDINE GLUCONATE 0.12% ORAL RINSE (MEDLINE KIT)
15.0000 mL | Freq: Two times a day (BID) | OROMUCOSAL | Status: DC
  Administered 2020-08-08 – 2020-08-14 (×12): 15 mL via OROMUCOSAL

## 2020-08-08 MED ORDER — FENTANYL CITRATE (PF) 100 MCG/2ML IJ SOLN
INTRAMUSCULAR | Status: AC
  Administered 2020-08-08: 100 ug via INTRAVENOUS
  Filled 2020-08-08: qty 2

## 2020-08-08 MED ORDER — PHENYLEPHRINE 40 MCG/ML (10ML) SYRINGE FOR IV PUSH (FOR BLOOD PRESSURE SUPPORT)
PREFILLED_SYRINGE | INTRAVENOUS | Status: AC
  Filled 2020-08-08: qty 10

## 2020-08-08 MED ORDER — MIDAZOLAM HCL 2 MG/2ML IJ SOLN
2.0000 mg | Freq: Once | INTRAMUSCULAR | Status: AC
  Administered 2020-08-08: 2 mg via INTRAVENOUS

## 2020-08-08 MED ORDER — FENTANYL CITRATE (PF) 100 MCG/2ML IJ SOLN: 100.0000 ug | Freq: Once | INTRAMUSCULAR | Status: AC

## 2020-08-08 MED ORDER — FENTANYL 2500MCG IN NS 250ML (10MCG/ML) PREMIX INFUSION
25.0000 ug/h | INTRAVENOUS | Status: DC
  Administered 2020-08-08: 100 ug/h via INTRAVENOUS
  Administered 2020-08-09 – 2020-08-10 (×3): 200 ug/h via INTRAVENOUS
  Filled 2020-08-08 (×4): qty 250

## 2020-08-08 MED ORDER — CHLORHEXIDINE GLUCONATE CLOTH 2 % EX PADS
6.0000 | MEDICATED_PAD | Freq: Every day | CUTANEOUS | Status: DC
  Administered 2020-08-08 – 2020-08-16 (×8): 6 via TOPICAL

## 2020-08-08 NOTE — Progress Notes (Signed)
PROGRESS NOTE    Mitchell King  AGT:[id number] DOB: 1950-12-08 DOA: 08/04/2020 PCP: Patient, No Pcp Per   Brief Narrative:  Mitchell King is a 70 y.o. M with COPD not on home O2, DM and HTN no longer on medication who presented with at least 1 week progressive LE edema and dyspnea on exertion not responsive to home albuterol.  In the ER, he was hypoxic to 70%.  CXR showed bilateral pulmonary edema, bilateral small effusions.  BNP 600.  He was started on Lasix.  Assessment & Plan:  Acute right-sided heart failure/cor pulmonale & several subsegmental PE which was noted on CTA chest on 2/15. -BNP: 690 on admission.  Reviewed chest x-ray.  COVID-19 negative -Started on Lasix 80 mg IV twice daily-on hold today as per cardiology -Reviewed recent echo-showed severely dilated RV -Cardiology on board-appreciate help -Net -10 L since admission.   -Continue with full dose heparin for PE -Doppler ultrasound of bilateral lower extremities resulted negative for acute DVT -Continue to monitor vitals closely  Acute hypoxic and hypercapnic respiratory failure: -Requiring BiPAP.  Initial ABG shows pH of 7.3, PCO2 79.7.  Patient placed on BiPAP.  Repeat ABG shows pH of 7.2 and PCO2 of 92.3.  Patient remains confused -Consult PCCM-for further eval and management  Acute metabolic encephalopathy: -Hospital delirium versus hyperammonemia versus hypercarbia/respiratory acidosis -CT head on 2/16: Negative for acute findings.  - Liver enzymes: WNL.  Reviewed right upper quadrant ultrasound -Ammonia level: WNL after receiving lactulose  Continue delirium precautions, repeat UA.  Chest x-ray shows decreased small left and unchanged small right pleural effusion with bibasilar atelectasis.  No pneumonia. -On fall/aspiration precautions -PT/OT recommended SNF -SLP recommended dysphagia 3 diet  Hypertension: Stable -Continue losartan and Coreg.-Continue hydralazine as needed -Monitor blood pressure  closely  Type 2 diabetes mellitus: Well controlled.  A1c 6.2% -monitor blood sugar closely  Metabolic alkalosis: -In the setting of diuresis. -Continue Diamox 500 mg twice daily  COPD: No wheezing noted on exam.  Continue albuterol as needed  Tobacco abuse: Smokes 1.5 pack of cigarettes per day.  Counseled about cessation  Macrocytosis: MCV: 101.   -B12, folate, TSH: WNL  Magnesium: Replenished.  Repeat magnesium level tomorrow a.m.  Patient is on increased risk of morbidity and mortality.  I called patient's son and discussed plan of care and he verbalized understanding.  DVT prophylaxis: Full dose heparin Code Status: Full code-confirmed with patient's son Family Communication:  None present at bedside.  Plan of care discussed with patient in length and he verbalized understanding and agreed with it.  Disposition Plan: SNF  Consultants:   Cardiology  PCCM Procedures:   None  Antimicrobials:   None  Status is: Inpatient   Dispo: The patient is from: Home              Anticipated d/c is to: Home              Anticipated d/c date is: 3 days              Patient currently is not medically stable to d/c.   Difficult to place patient No     Subjective: Patient seen and examined.  Drowsy but arousable however goes back to sleep easily.  Appears confused.  Remained afebrile.  Had 2 bowel movement after receiving lactulose.  No acute events overnight.  Objective: Vitals:   08/08/20 0200 08/08/20 0558 08/08/20 0900 08/08/20 1149  BP:  124/76 129/69 119/63  Pulse:  (!) 52 (!)  55 (!) 52  Resp:  18 18 20   Temp:  98.2 F (36.8 C) 98 F (36.7 C) 97.8 F (36.6 C)  TempSrc:   Oral Axillary  SpO2:  96% 90% 100%  Weight: 83.2 kg     Height:        Intake/Output Summary (Last 24 hours) at 08/08/2020 1306 Last data filed at 08/08/2020 0940 Gross per 24 hour  Intake 652.79 ml  Output 900 ml  Net -247.21 ml   Filed Weights   08/06/20 0322 08/07/20 0029 08/08/20  0200  Weight: 81.2 kg 87.9 kg 83.2 kg    Examination:  General exam: On nasal cannula, appears weak and sick, drowsy but arousable but goes back to sleep easily Respiratory system: Diminished breath sound noted.  No wheezing or rhonchi. Cardiovascular system: S1 & S2 heard, RRR. No JVD, murmurs, rubs, gallops or clicks.  Bilateral trace edema positive Gastrointestinal system: Abdomen is nondistended, soft and nontender. No organomegaly or masses felt. Normal bowel sounds heard. Central nervous system: Drowsy, not following commands, appears confused Skin: No rashes, lesions or ulcers  Data Reviewed: I have personally reviewed following labs and imaging studies  CBC: Recent Labs  Lab 08/04/20 1440 08/05/20 0546 08/06/20 0353 08/07/20 0311 08/08/20 0321  WBC 4.3 4.5 4.4 5.3 3.8*  HGB 16.2 16.1 16.4 15.2 15.4  HCT 54.4* 54.3* 54.3* 50.4 50.4  MCV 102.3* 103.6* 101.1* 102.0* 101.8*  PLT 247 239 222 190 205   Basic Metabolic Panel: Recent Labs  Lab 08/04/20 1440 08/05/20 0546 08/06/20 0353 08/07/20 0311 08/08/20 0321  NA 135 137 137 136 137  K 4.4 4.3 4.7 4.5 4.0  CL 99 97* 93* 94* 91*  CO2 27 30 34* 33* 39*  GLUCOSE 101* 84 113* 137* 121*  BUN 27* 25* 23 30* 35*  CREATININE 1.25* 1.12 0.94 1.04 1.20  CALCIUM 8.8* 9.1 9.2 8.6* 9.0  MG  --   --   --  1.8 2.2   GFR: Estimated Creatinine Clearance: 64.7 mL/min (by C-G formula based on SCr of 1.2 mg/dL). Liver Function Tests: Recent Labs  Lab 08/06/20 0353 08/07/20 0951 08/08/20 0321  AST 30 29 28   ALT 15 15 16   ALKPHOS 66 60 62  BILITOT 2.1* 1.3* 1.9*  PROT 7.8 7.1 7.4  ALBUMIN 3.1* 2.9* 3.1*   No results for input(s): LIPASE, AMYLASE in the last 168 hours. Recent Labs  Lab 08/07/20 0733 08/08/20 0321  AMMONIA 75* 30   Coagulation Profile: Recent Labs  Lab 08/04/20 2055  INR 1.1   Cardiac Enzymes: No results for input(s): CKTOTAL, CKMB, CKMBINDEX, TROPONINI in the last 168 hours. BNP (last 3  results) No results for input(s): PROBNP in the last 8760 hours. HbA1C: No results for input(s): HGBA1C in the last 72 hours. CBG: Recent Labs  Lab 08/07/20 0343  GLUCAP 155*   Lipid Profile: No results for input(s): CHOL, HDL, LDLCALC, TRIG, CHOLHDL, LDLDIRECT in the last 72 hours. Thyroid Function Tests: Recent Labs    08/06/20 0353  TSH 0.858   Anemia Panel: Recent Labs    08/06/20 0353  VITAMINB12 432  FOLATE 7.8   Sepsis Labs: Recent Labs  Lab 08/07/20 1002 08/07/20 1314  LATICACIDVEN 1.1 1.1    Recent Results (from the past 240 hour(s))  SARS CORONAVIRUS 2 (TAT 6-24 HRS) Nasopharyngeal Nasopharyngeal Swab     Status: None   Collection Time: 08/05/20  1:03 AM   Specimen: Nasopharyngeal Swab  Result Value Ref Range Status  SARS Coronavirus 2 NEGATIVE NEGATIVE Final    Comment: (NOTE) SARS-CoV-2 target nucleic acids are NOT DETECTED.  The SARS-CoV-2 RNA is generally detectable in upper and lower respiratory specimens during the acute phase of infection. Negative results do not preclude SARS-CoV-2 infection, do not rule out co-infections with other pathogens, and should not be used as the sole basis for treatment or other patient management decisions. Negative results must be combined with clinical observations, patient history, and epidemiological information. The expected result is Negative.  Fact Sheet for Patients: HairSlick.nohttps://www.fda.gov/media/138098/download  Fact Sheet for Healthcare Providers: quierodirigir.comhttps://www.fda.gov/media/138095/download  This test is not yet approved or cleared by the [place] FDA and  has been authorized for detection and/or diagnosis of SARS-CoV-2 by FDA under an Emergency Use Authorization (EUA). This EUA will remain  in effect (meaning this test can be used) for the duration of the COVID-19 declaration under Se ction 564(b)(1) of the Act, 21 U.S.C. section 360bbb-3(b)(1), unless the authorization is terminated or revoked  sooner.  Performed at [facility], [address] [place], [place] [address]       Radiology Studies: CT HEAD WO CONTRAST  Result Date: 08/07/2020 CLINICAL DATA:  Mental status change with unknown cause EXAM: CT HEAD WITHOUT CONTRAST TECHNIQUE: Contiguous axial images were obtained from the base of the skull through the vertex without intravenous contrast. COMPARISON:  04/20/2017 FINDINGS: Brain: No evidence of acute infarction, hemorrhage, hydrocephalus, extra-axial collection or mass lesion/mass effect. Vermian atrophy. There is history of alcohol abuse. Vascular: No hyperdense vessel or unexpected calcification. Skull: Normal. Negative for fracture or focal lesion. Sinuses/Orbits: No acute finding. 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 Electronically Signed   By: Marnee SpringJonathon  Watts M.D.   On: 08/07/2020 04:44   DG CHEST PORT 1 VIEW  Result Date: 08/08/2020 CLINICAL DATA:  Altered mental status. EXAM: PORTABLE CHEST 1 VIEW COMPARISON:  CT chest dated August 05, 2020. Chest x-ray dated August 04, 2020. FINDINGS: The left costophrenic angle is excluded from the field of view. Unchanged mild cardiomegaly and pulmonary vascular congestion. Decreased small left and unchanged small right pleural effusions with unchanged mild right basilar atelectasis. Improving aeration at the left lung base. No pneumothorax. No acute osseous abnormality. IMPRESSION: 1. Decreased small left and unchanged small right pleural effusions with bibasilar atelectasis. Electronically Signed   By: Obie DredgeWilliam T Derry M.D.   On: 08/08/2020 10:25   US Abdomen Limited RUQ (LIVER/GB)  Result Date: 08/07/2020 CLINICAL DATA:  Elevated liver enzymes EXAM: ULTRASOUND ABDOMEN LIMITED RIGHT UPPER QUADRANT COMPARISON:  Abdominal ultrasound August 26, 2009 FINDINGS: Gallbladder: The gallbladder is contracted and filled with calculi. Largest  individual gallstone measures approximately 1 cm in length. Gallbladder wall is thickened without edema or pericholecystic fluid. No sonographic Murphy sign noted by sonographer. Common bile duct: Diameter: 5 mm. No intrahepatic or extrahepatic biliary duct dilatation. Liver: No focal lesion identified. Liver echogenicity overall is increased. Portal vein is patent on color Doppler imaging with normal direction of blood flow towards the liver. Other: There is mild ascites.  Small right pleural effusion. IMPRESSION: 1. Gallbladder is contracted and filled with calculi. Gallbladder wall mildly thickened without gallbladder wall edema. No pericholecystic fluid. 2. Increased liver echogenicity, a finding likely indicative of hepatic steatosis, potentially with underlying parenchymal liver disease. No focal liver lesions are demonstrable on this study. 3.  Mild ascites. 4.  Small right pleural effusion. Electronically Signed   By: Chrissie NoaWilliam  Margarita Grizzle III M.D.   On: 08/07/2020 16:03    Scheduled Meds: . acetaZOLAMIDE  500 mg Oral Q12H  . carvedilol  25 mg Oral BID WC  . lactulose  20 g Oral BID  . losartan  100 mg Oral Daily  . sodium chloride flush  3 mL Intravenous Q12H   Continuous Infusions: . sodium chloride    . heparin 1,700 Units/hr (08/08/20 0800)     LOS: 4 days   Time spent: 35 minutes   Amedeo Detweiler Estill Cotta, MD [facility]  If 7PM-7AM, please contact night-coverage [email or web address] 08/08/2020, 1:06 PM

## 2020-08-08 NOTE — Progress Notes (Signed)
Heart Failure Navigator Progress Note  Assessed for Heart & Vascular TOC clinic readiness.  Unfortunately at this time the patient does not meet criteria due to decompensation requiring transfer to ICU for change in AMS and retaining CO2-BiPAP.   Navigator available for reassessment of patient as symptoms improve.   Ozella Rocks, RN, BSN Heart Failure Nurse Navigator [phone]

## 2020-08-08 NOTE — TOC Initial Note (Signed)
Transition of Care [facility]) - Initial/Assessment Note    Patient Details  Name: Mitchell King MRN: [id number] Date of Birth: 1951/04/19  Transition of [facility]) CM/SW Contact:    Erin Sons, LCSW Phone Number: 08/08/2020, 3:19 PM  Clinical Narrative:                  Pt is not oriented. CSW called pt son to discuss SNF recommendation. Son explains pt lives alone in a home. He states that pt will be living with pt son after rehab. Son is agreeable to SNF workup. CSW completed FL2. Bed requests would be better to be faxed once pt has progressed more.    Expected Discharge Plan: Skilled Nursing Facility Barriers to Discharge: Continued Medical Work up   Patient Goals and CMS Choice Patient states their goals for this hospitalization and ongoing recovery are:: Son wants pt to go to SNF      Expected Discharge Plan and Services Expected Discharge Plan: Skilled Nursing Facility       Living arrangements for the past 2 months: Single Family Home                                      Prior Living Arrangements/Services Living arrangements for the past 2 months: Single Family Home Lives with:: Self          Need for Family Participation in Patient Care: Yes (Comment) Care giver support system in place?: Yes (comment)      Activities of Daily Living Home Assistive Devices/Equipment: Cane (specify quad or straight),Eyeglasses ADL Screening (condition at time of admission) Patient's cognitive ability adequate to safely complete daily activities?: Yes Is the patient deaf or have difficulty hearing?: No Does the patient have difficulty seeing, even when wearing glasses/contacts?: No Does the patient have difficulty concentrating, remembering, or making decisions?: No Patient able to express need for assistance with ADLs?: Yes Does the patient have difficulty dressing or bathing?: No Independently performs ADLs?: Yes (appropriate for developmental age) Does the patient  have difficulty walking or climbing stairs?: Yes Weakness of Legs: Both Weakness of Arms/Hands: None  Permission Sought/Granted                  Emotional Assessment         Alcohol / Substance Use: Not Applicable Psych Involvement: No (comment)  Admission diagnosis:  Elevated troponin [R77.8] Acute exacerbation of CHF (congestive heart failure) (HCC) [I50.9] Acute congestive heart failure, unspecified heart failure type (HCC) [I50.9] Patient Active Problem List   Diagnosis Date Noted  . Acute cor pulmonale (HCC) 08/06/2020  . Pulmonary embolism (HCC) 08/06/2020  . Right heart failure (HCC) 08/06/2020  . Acute exacerbation of CHF (congestive heart failure) (HCC) 08/04/2020  . Essential hypertension 08/04/2020  . Diabetes mellitus (HCC) 08/04/2020  . Nondependent alcohol abuse, continuous drinking behavior 08/04/2020  . Acute hepatitis C 10/27/2001  . Chronic obstructive pulmonary disease (HCC) 08/21/2001   PCP:  Patient, No Pcp Per Pharmacy:   [facility] - [place], [place] - [address] [place] [address] Phone: [phone] Fax: [phone]  [id number] - [place], [place] - [address] AT [facility] [place] [facility] [address] [place] [address] Phone: [phone] Fax: [phone]  [facility] - [place], [place] - [address]  [place] [address] Phone: [phone] Fax: [phone]     Social Determinants of Health (SDOH) Interventions    Readmission Risk Interventions No flowsheet data found.

## 2020-08-08 NOTE — Care Management Important Message (Signed)
Important Message  Patient Details  Name: Mitchell King MRN: [id number] Date of Birth: 07-24-1950   Medicare Important Message Given:  Yes     Renie Ora 08/08/2020, 8:41 AM

## 2020-08-08 NOTE — Consult Note (Signed)
NAME:  Mitchell King, MRN:  [id number], DOB:  04/21/1951, LOS: 4 ADMISSION DATE:  08/04/2020, CONSULTATION DATE:  2/18 REFERRING MD:  Dr. Jacqulyn BathPahwani, CHIEF COMPLAINT: Hypercapnic respiratory failure  Brief History:  70 year old male initially presented with progressive shortness of breath, cough, and orthopnea.  Seen with worsening hypercapnic respiratory failure morning of 2/18 prompting PCCM consult  History of Present Illness:  Mitchell King is a 70 year old male with a past medical history significant for COPD, type 2 diabetes, hypertension, and previous alcohol abuse who presented to the emergency department for complaints of progressive shortness of breath, cough, worsening lower extremity edema, and orthopnea that began 2 weeks prior to admission.  Patient reports he has not been evaluated by medical staff in several years and in so patient attempted to treat self conservatively but when this failed he presented to the emergency department.  On arrival patient was seen with generalized anasarca with 3+ pitting edema in bilateral crackles.  Vital signs significant for hypertension and hypoxia on room air.  Patient was admitted per hospitalist for treatment of acute right heart failure.  During admission thus far he has received IV diuretics, electrolyte supplementation, close monitoring of hemodynamics, initiation of nocturnal CPAP.  Cardiology consulted.  Morning of 2/18 patient seen with worsening encephalopathy and somnolence resulting in obtaining ABG that revealed significant hypercapnia.  Patient was placed on BiPAP therapy with little improvement in mentation seen.  Repeat ABG revealed worsening hypercapnia with CO2 92.3.  PCCM was then consulted for further management.  Patient was transferred to ICU for closer monitoring.  Past Medical History:  Previous alcohol abuse Type 2 diabetes COPD Hypertension Tobacco abuse  Significant Hospital Events:  Admitted 2/14  Consults:   Cardiology  Procedures:    Significant Diagnostic Tests:  CTA chest 2/15 > several subsegmental pulmonary emboli in the pulmonary arteries, moderate bilateral pleural effusion right greater than left, aspirated debris and bronchial wall thickening of the right lower lobe, and enlarged pulmonary artery  Head CT 2/17pain > negative  Abdominal ultrasound 2/17 > multiple gallstones with mild thickening of gallbladder wall without edema, hepatic steatosis  Micro Data:  Covid 2/15 > negative  Antimicrobials:     Interim History / Subjective:  Seen lying in bed with minimal responsiveness  Objective   Blood pressure 119/63, pulse (!) 52, temperature 97.8 F (36.6 C), temperature source Axillary, resp. rate 20, height 6\' 1"  (1.854 m), weight 83.2 kg, SpO2 100 %.        Intake/Output Summary (Last 24 hours) at 08/08/2020 1337 Last data filed at 08/08/2020 0940 Gross per 24 hour  Intake 652.79 ml  Output 900 ml  Net -247.21 ml   Filed Weights   08/06/20 0322 08/07/20 0029 08/08/20 0200  Weight: 81.2 kg 87.9 kg 83.2 kg    Examination: General: Chronically ill appearing elderly male lying in bed on BiPAP in no acute distress HEENT: BiPAP mask in place, MM pink/moist, PERRL, visible surface forehead veins Neuro: Minimally responsive, will grimace/moan to painful stimuli only CV: s1s2 regular rate and rhythm, no murmur, rubs, or gallops,  PULM: Decreased bilateral breath sounds, BiPAP mask in place, no accessory muscle use seen GI: soft, bowel sounds active in all 4 quadrants, non-tender, non-distended Extremities: warm/dry, bilateral pitting edema edema  Skin: no rashes or lesions  Resolved Hospital Problem list     Assessment & Plan:  Acute decompensated right heart failure -Patient presented with progressive shortness of breath, anasarca, and orthopnea -Echo revealed severely dilated  RV and RA consistent with right heart failure, preserved EF P: Cardiology following,  appreciate assistance Strict intake and output Diurese as able Net -10 L thus far Monitor hemodynamics closely in the ICU setting  Several subsegmental PE -Seen on CTA 2/15, family reports decreased mobility prior to admission due to lower extremity edema P: Continue heparin drip Supportive care Monitor in the ICU setting  Acute Hypoxic and hypercapnic Respiratory Failure  -Secondary to decompensated right heart failure with underlying possible COPD exasperation Hx of COPD with concern for acute exacerbation  Active smoker  P: Continue BiPAP therapy currently Adjusted IPAP and EPAP settings with slight improvement in mentation seen Repeat ABG in 1 hour Patient will likely need more respiratory support including intubation Family confirmed patient would want to be full code including intubation if needed Given presence of severe right heart failure hemodynamics may be monitored closely if/when intubation is performed Head of bed elevated 30 degrees Close monitoring in the ICU setting BDs  Low threshold to initiate   Acute metabolic encephalopathy -Secondary to significant hypercapnia with possible underlying hospital delirium as well -Head CT 2/16, liver enzymes, and ammonia level within normal limits P: Treat underlying cause  BIPAP as above  Delirium   Hx of HTN  P: Continue losartan and coreg  PRN IV hydralazine  Monitor in the ICU setting   HX of type 2 diabetes -Managed by diet PTA, Hgb A1C 6.2 P: SSI CBG q 4hrs   Elevated total bilirubin Cholelithiasis without acute Cholelithiasis P: LFTs WNL  Trend bilirubin  Best practice (evaluated daily)  Diet: NPO Pain/Anxiety/Delirium protocol (if indicated): As needed  VAP protocol (if indicated): In place  DVT prophylaxis: IV heparin GI prophylaxis: PPI Glucose control: SSi Mobility: Bedrest  Disposition:ICU  Goals of Care:  Last date of multidisciplinary goals of care discussion:Pending  Family and  staff present: Pending  Summary of discussion: Pending  Follow up goals of care discussion due: Pending  Code Status: Full  Labs   CBC: Recent Labs  Lab 08/04/20 1440 08/05/20 0546 08/06/20 0353 08/07/20 0311 08/08/20 0321  WBC 4.3 4.5 4.4 5.3 3.8*  HGB 16.2 16.1 16.4 15.2 15.4  HCT 54.4* 54.3* 54.3* 50.4 50.4  MCV 102.3* 103.6* 101.1* 102.0* 101.8*  PLT 247 239 222 190 205    Basic Metabolic Panel: Recent Labs  Lab 08/04/20 1440 08/05/20 0546 08/06/20 0353 08/07/20 0311 08/08/20 0321  NA 135 137 137 136 137  K 4.4 4.3 4.7 4.5 4.0  CL 99 97* 93* 94* 91*  CO2 27 30 34* 33* 39*  GLUCOSE 101* 84 113* 137* 121*  BUN 27* 25* 23 30* 35*  CREATININE 1.25* 1.12 0.94 1.04 1.20  CALCIUM 8.8* 9.1 9.2 8.6* 9.0  MG  --   --   --  1.8 2.2   GFR: Estimated Creatinine Clearance: 64.7 mL/min (by C-G formula based on SCr of 1.2 mg/dL). Recent Labs  Lab 08/05/20 0546 08/06/20 0353 08/07/20 0311 08/07/20 1002 08/07/20 1314 08/08/20 0321  WBC 4.5 4.4 5.3  --   --  3.8*  LATICACIDVEN  --   --   --  1.1 1.1  --     Liver Function Tests: Recent Labs  Lab 08/06/20 0353 08/07/20 0951 08/08/20 0321  AST 30 29 28   ALT 15 15 16   ALKPHOS 66 60 62  BILITOT 2.1* 1.3* 1.9*  PROT 7.8 7.1 7.4  ALBUMIN 3.1* 2.9* 3.1*   No results for input(s): LIPASE, AMYLASE in the  last 168 hours. Recent Labs  Lab 08/07/20 0733 08/08/20 0321  AMMONIA 75* 30    ABG    Component Value Date/Time   PHART 7.253 (L) 08/08/2020 1254   PCO2ART 92.3 (HH) 08/08/2020 1254   PO2ART 198 (H) 08/08/2020 1254   HCO3 39.3 (H) 08/08/2020 1254   TCO2 27 04/20/2017 1616   O2SAT 99.0 08/08/2020 1254     Coagulation Profile: Recent Labs  Lab 08/04/20 2055  INR 1.1    Cardiac Enzymes: No results for input(s): CKTOTAL, CKMB, CKMBINDEX, TROPONINI in the last 168 hours.  HbA1C: Hemoglobin A1C  Date/Time Value Ref Range Status  04/20/2017 02:18 PM 5.7  Final   Hgb A1c MFr Bld  Date/Time Value  Ref Range Status  08/05/2020 05:46 AM 6.2 (H) 4.8 - 5.6 % Final    Comment:    (NOTE)         Prediabetes: 5.7 - 6.4         Diabetes: >6.4         Glycemic control for adults with diabetes: <7.0     CBG: Recent Labs  Lab 08/07/20 0343  GLUCAP 155*    Review of Systems:   Unable to assess   Past Medical History:  He,  has a past medical history of CHF (congestive heart failure) (HCC), COPD (chronic obstructive pulmonary disease) (HCC), Diabetes mellitus without complication (HCC), Dyspnea, and Hypertension.   Surgical History:   Past Surgical History:  Procedure Laterality Date  . NO PAST SURGERIES       Social History:   reports that he has been smoking cigarettes. He has a 60.00 pack-year smoking history. He has never used smokeless tobacco. He reports that he does not drink alcohol and does not use drugs.   Family History:  His family history is not on file.   Allergies No Known Allergies   Home Medications  Prior to Admission medications   Medication Sig Start Date End Date Taking? Authorizing Provider  acetaminophen (TYLENOL) 500 MG tablet Take 500-1,000 mg by mouth every 6 (six) hours as needed for mild pain (or headaches).   Yes [provider]  albuterol (PROVENTIL HFA;VENTOLIN HFA) 108 (90 Base) MCG/ACT inhaler Inhale 2 puffs every 4 (four) hours as needed into the lungs for wheezing or shortness of breath. 05/05/17 06/04/17 Yes Loletta Specter, PA-C  guaifenesin (ROBITUSSIN) 100 MG/5ML syrup Take 100 mg by mouth at bedtime as needed (for chest congestion).   Yes [provider]  Tiotropium Bromide-Olodaterol (STIOLTO RESPIMAT) 2.5-2.5 MCG/ACT AERS Inhale 1 puff into the lungs in the morning and at bedtime.   Yes [provider]  azithromycin (ZITHROMAX) 250 MG tablet Take two tablets on day one then one tablet daily thereafter Patient not taking: Reported on 08/04/2020 05/05/17   Loletta Specter, PA-C  hydrochlorothiazide  (HYDRODIURIL) 12.5 MG tablet Take 1 tablet (12.5 mg total) daily by mouth. Patient not taking: Reported on 08/04/2020 05/05/17   Loletta Specter, PA-C  Fluticasone-Salmeterol (ADVAIR DISKUS) 250-50 MCG/DOSE AEPB Inhale 1 puff into the lungs 2 (two) times daily. 11/20/11 10/11/13  Reuben Likes, MD     Critical care time:    Performed by: Delfin Gant  Total critical care time: 45 minutes  Critical care time was exclusive of separately billable procedures and treating other patients.  Critical care was necessary to treat or prevent imminent or life-threatening deterioration.  Critical care was time spent personally by me on the following activities: development  of treatment plan with patient and/or surrogate as well as nursing, discussions with consultants, evaluation of patient's response to treatment, examination of patient, obtaining history from patient or surrogate, ordering and performing treatments and interventions, ordering and review of laboratory studies, ordering and review of radiographic studies, pulse oximetry and re-evaluation of patient's condition.  Delfin Gant, NP-C [place] [facility] Personal contact information can be found on Amion  If no response please page: Adult pulmonary and critical care medicine pager on Amion unitl 7pm After 7pm please call [phone] 08/08/2020, 3:02 PM

## 2020-08-08 NOTE — Significant Event (Signed)
Rapid Response Progress Note  Reason for Call : Pt placed on BiPAP for increased CO2   Initial Focused Assessment: RRRN called to bedside for BiPAP placement due to increased CO2 on ABG with possible transfer to ICU. On arrival, patient responds to voice and able to only state his name. He is warm and dry. Diminished breath sounds throughout with no increased work of breathing.   VS: HR 54, O2 sats 94% with BIPAP 70% and 16/8.  Plan of Care:  ABG to be drawn one hour after BiPAP placement. Possible transfer to ICU depending on results.   Event Summary:   Call Time: 1019 Arrival Time: 1022 End Time: 1050  Marin Comment, RN

## 2020-08-08 NOTE — NC FL2 (Signed)
[place] MEDICAID FL2 LEVEL OF CARE SCREENING TOOL     IDENTIFICATION  Patient Name: Mitchell King Birthdate: Jul 11, 1950 Sex: male Admission Date (Current Location): 08/04/2020  [facility] and [place] Number:      Facility and Address:  [facility], [address] [place], [place] [address]      Provider Number: [id number]  Attending Physician Name and Address:  Ollen Bowl, MD  Relative Name and Phone Number:  Arul, [place] [facility])   [phone] [facility])    Current Level of Care: Hospital Recommended Level of Care: Skilled Nursing Facility Prior Approval Number:    Date Approved/Denied:   PASRR Number: [id number]  Discharge Plan: SNF    Current Diagnoses: Patient Active Problem List   Diagnosis Date Noted  . Acute cor pulmonale (HCC) 08/06/2020  . Pulmonary embolism (HCC) 08/06/2020  . Right heart failure (HCC) 08/06/2020  . Acute exacerbation of CHF (congestive heart failure) (HCC) 08/04/2020  . Essential hypertension 08/04/2020  . Diabetes mellitus (HCC) 08/04/2020  . Nondependent alcohol abuse, continuous drinking behavior 08/04/2020  . Acute hepatitis C 10/27/2001  . Chronic obstructive pulmonary disease (HCC) 08/21/2001    Orientation RESPIRATION BLADDER Height & Weight        O2 (4L [place]) Indwelling catheter Weight: 183 lb 6.8 oz (83.2 kg) Height:  6\' 1"  (185.4 cm)  BEHAVIORAL SYMPTOMS/MOOD NEUROLOGICAL BOWEL NUTRITION STATUS      Continent Diet (see d/c summary)  AMBULATORY STATUS COMMUNICATION OF NEEDS Skin   Extensive Assist Verbally Normal                       Personal Care Assistance Level of Assistance  Bathing,Feeding,Dressing Bathing Assistance: Maximum assistance Feeding assistance: Limited assistance Dressing Assistance: Maximum assistance     Functional Limitations Info  Sight,Hearing,Speech Sight Info: Adequate Hearing Info: Impaired Speech Info: Adequate    SPECIAL CARE FACTORS FREQUENCY   PT (By licensed PT),OT (By licensed OT)     PT Frequency: 5x/week OT Frequency: 5x/week            Contractures Contractures Info: Not present    Additional Factors Info  Code Status,Allergies Code Status Info: Full code Allergies Info: No known allergies           Current Medications (08/08/2020):  This is the current hospital active medication list Current Facility-Administered Medications  Medication Dose Route Frequency Provider Last Rate Last Admin  . 0.9 %  sodium chloride infusion  250 mL Intravenous PRN 08/10/2020, MD      . acetaminophen (TYLENOL) tablet 650 mg  650 mg Oral Q4H PRN Rometta Emery, MD   650 mg at 08/08/20 0210  . acetaZOLAMIDE (DIAMOX) 12 hr capsule 500 mg  500 mg Oral Q12H O'Neal, 08/10/20, MD      . arformoterol [facility]) nebulizer solution 15 mcg  15 mcg Nebulization BID [facility] P, DO      . carvedilol (COREG) tablet 25 mg  25 mg Oral BID WC Karie Fetch, MD   25 mg at 08/07/20 1730  . Chlorhexidine Gluconate Cloth 2 % PADS 6 each  6 each Topical Daily 08/09/20 P, DO      . guaifenesin (ROBITUSSIN) 100 MG/5ML syrup 100 mg  100 mg Oral QHS PRN Karie Fetch, Mohammad L, MD      . heparin ADULT infusion 100 units/mL (25000 units/283mL)  1,700 Units/hr Intravenous Continuous Pahwani, Rinka R, MD 17 mL/hr at  08/08/20 0800 1,700 Units/hr at 08/08/20 0800  . hydrALAZINE (APRESOLINE) injection 10 mg  10 mg Intravenous Q4H PRN Raymon Mutton F, NP      . ipratropium-albuterol (DUONEB) 0.5-2.5 (3) MG/3ML nebulizer solution 3 mL  3 mL Nebulization Q4H PRN Karie Fetch P, DO      . lactulose (CHRONULAC) 10 GM/15ML solution 20 g  20 g Oral BID Pahwani, Rinka R, MD   20 g at 08/07/20 2002  . losartan (COZAAR) tablet 100 mg  100 mg Oral Daily Sande Rives, MD   100 mg at 08/07/20 1157  . ondansetron (ZOFRAN) injection 4 mg  4 mg Intravenous Q6H PRN Rometta Emery, MD      . revefenacin (YUPELRI) nebulizer solution 175 mcg  175  mcg Nebulization Daily Karie Fetch P, DO   175 mcg at 08/08/20 1513  . sodium chloride flush (NS) 0.9 % injection 3 mL  3 mL Intravenous Q12H Rometta Emery, MD   3 mL at 08/08/20 0926  . sodium chloride flush (NS) 0.9 % injection 3 mL  3 mL Intravenous PRN Rometta Emery, MD         Discharge Medications: Please see discharge summary for a list of discharge medications.  Relevant Imaging Results:  Relevant Lab Results:   Additional Information SSN [id number] [place], [place]

## 2020-08-08 NOTE — Procedures (Signed)
Intubation Procedure Note  ARLOW SPIERS  [id number]  [birth year]  Date:08/08/20  Time:4:45 PM   Provider Performing:Teale Goodgame Judie Petit Pecola Leisure    Procedure: Intubation (31500)  Indication(s) Respiratory Failure  Consent Risks of the procedure as well as the alternatives and risks of each were explained to the patient and/or caregiver.  Consent for the procedure was obtained and is signed in the bedside chart   Anesthesia Versed, Fentanyl and Rocuronium   Time Out Verified patient identification, verified procedure, site/side was marked, verified correct patient position, special equipment/implants available, medications/allergies/relevant history reviewed, required imaging and test results available.   Sterile Technique Usual hand hygeine, masks, and gloves were used   Procedure Description Patient positioned in bed supine.  Sedation given as noted above.  Patient was intubated with endotracheal tube using Glidescope.  View was Grade 1 full glottis .  Number of attempts was 1.  Colorimetric CO2 detector was consistent with tracheal placement.   Complications/Tolerance None; patient tolerated the procedure well. Chest X-ray is ordered to verify placement.   EBL Minimal  Specimen(s) None  Procedure was supervised/proctored by Dr. Zoe Lan, who was present for the entire procedure.  Tim Lair, PA-C [place] [facility] 08/08/20 4:46 PM  Please see [email or web address] for pager details.

## 2020-08-08 NOTE — Progress Notes (Signed)
Critical ABG results given to Dr Chestine Spore.

## 2020-08-08 NOTE — Progress Notes (Signed)
Date and time results received: 08/08/20  0945  Test:Pc02  Critical Value: 79.7  Name of Provider Notified: Dr. Jacqulyn Bath  Orders Received? Or Actions Taken?: BIPAP & CXRAY

## 2020-08-08 NOTE — Progress Notes (Signed)
Date and time results received: 08/08/20 13:14 (use smartphrase ".now" to insert current time)  Test: pCO2 Critical Value: 92.3   Name of Provider Notified: 08/08/20 13 14  Orders Received? Or Actions Taken?: see orders/ICU transfer

## 2020-08-08 NOTE — Progress Notes (Signed)
Heart Failure Stewardship Pharmacist Progress Note   PCP: Patient, No Pcp Per PCP-Cardiologist: No primary care provider on file.    HPI:  70 yo M with PMH of T2DM, COPD, HTN, prior alcohol abuse, and medication noncompliance. He presented to the ED On 08/04/20 with progressive shortness of breath, DOE, PND, and orthopnea over the last two weeks. He was admitted for acute CHF. An ECHO was done on 08/05/20 and LVEF is 55-60%. Also found to have several scattered small PE's.  Current HF Medications: Diamox 500 mg PO q12h Carvedilol 25 mg BID Losartan 100 mg daily  Prior to admission HF Medications: None  Pertinent Lab Values: . Serum creatinine 1.20, BUN 35, Potassium 4.0, Sodium 137, BNP 690.9  Vital Signs: . Weight: 183 lbs (admission weight: 180 lbs) . Blood pressure: 120/60s . Heart rate: 50-60s   Medication Assistance / Insurance Benefits Check: Does the patient have prescription insurance?  Yes Type of insurance plan: [facility]  Outpatient Pharmacy:  Prior to admission outpatient pharmacy: [facility] Is the patient willing to use [facility] TOC pharmacy at discharge? Yes   Assessment: 1. Acute diastolic CHF (EF 19-41%). NYHA class III symptoms. Remains fluid overloaded on MD exam.  - Furosemide IV on hold today with bump in SCr. Continue diamox. - Continue carvedilol 25 mg BID - Continue losartan 100 mg daily. Could consider optimizing further to [facility] given newer indication for LVEF <57% - Consider starting spironolactone 25 mg daily - Consider starting Jardiance 10 mg daily prior to discharge (following IV diuresis). [place] Pepper is the SGLT2i approved on the [place] formulary   Plan: 1) Medication changes recommended at this time: - Continue current regimen; workup ongoing for encephalopathy with possible underlying parenchymal liver disease / cirrhosis  2) Patient assistance: - [facility] would benefit [facility] medications as they would be free for  him (Eliquis is on the [place] formulary for treatment of VTE)  3)  Education  - To be completed prior to discharge  Sharen Hones, PharmD, BCPS Heart Failure Stewardship Pharmacist Phone [phone]

## 2020-08-08 NOTE — Progress Notes (Signed)
CCM at the bedside, RR team at the bedside. Report called to [facility] nurse, Pt was transferred to room 2H06 with Heparin drip infusing, mental status remains the same, responsive to voice and or pain intermientlly, HR brady, O2 sats on BiPAP 95%, Disorient x4, unable to follow commands.

## 2020-08-08 NOTE — Progress Notes (Signed)
ANTICOAGULATION CONSULT NOTE - Follow Up Consult  Pharmacy Consult for Heparin Indication: pulmonary embolus  No Known Allergies  Patient Measurements: Height: 6\' 1"  (185.4 cm) Weight: 83.2 kg (183 lb 6.8 oz) IBW/kg (Calculated) : 79.9  Vital Signs: Temp: 98.2 F (36.8 C) (02/18 0558) BP: 124/76 (02/18 0558) Pulse Rate: 52 (02/18 0558)  Labs: Recent Labs    08/06/20 0353 08/06/20 1350 08/06/20 2230 08/07/20 0311 08/08/20 0321  HGB 16.4  --   --  15.2 15.4  HCT 54.3*  --   --  50.4 50.4  PLT 222  --   --  190 205  HEPARINUNFRC <0.10*   < > 0.44 0.35 0.54  CREATININE 0.94  --   --  1.04 1.20   < > = values in this interval not displayed.    Estimated Creatinine Clearance: 64.7 mL/min (by C-G formula based on SCr of 1.2 mg/dL).  Assessment: 70 year old male to begin heparin for several scattered subsegmetal PE's. Negative for DVT. No AC PTA.  Heparin level is  therapeutic at 0.54, on 1700 units/hr. Hgb, plt stable. No s/sx of bleeding or infusion issues.   Goal of Therapy:  Heparin level 0.3-0.7 units/ml Monitor platelets by anticoagulation protocol: Yes   Plan:  Continue heparin drip to 1700 units / hr Daily heparin level, CBC F/u plans for PO AC  66, PharmD, BCPS, BCCP Clinical Pharmacist  Please check AMION for all [facility] Pharmacy phone numbers After 10:00 PM, call Main Pharmacy [phone]

## 2020-08-08 NOTE — Evaluation (Signed)
Occupational Therapy Evaluation Patient Details Name: Mitchell King MRN: [id number] DOB: 16-Jan-1951 Today's Date: 08/08/2020    History of Present Illness Mitchell King is a 70 y.o. male with medical history significant of diet-controlled diabetes, COPD, hypertension, previous alcohol abuse, who has not seen doctors in years and has not been taking his medications for a while except for inhalers.  Patient has had progressive shortness of breath cough PND and orthopnea over the last 2 weeks.  He is also noted progressive worsening of lower extremity edema. Found to have acute exacerbation of CHF, acute PE, acute metabolic encephalopathy.   Clinical Impression   This 70 yo male admitted with above presents to acute OT with PLOF per chart of living alone. Currently he is very lethargic, total A (with +2 for some) for all basic ADLs and for mobility he is total A +2 for all except for sit<>stand (Max A +2). He will continue to benefit from acute OT with follow up at SNF.    Follow Up Recommendations  SNF;Supervision/Assistance - 24 hour    Equipment Recommendations  Other (comment) (TBD next venue)       Precautions / Restrictions Precautions Precautions: Fall Precaution Comments: monitor O2 sats Restrictions Weight Bearing Restrictions: No      Mobility Bed Mobility Overal bed mobility: Needs Assistance Bed Mobility: Supine to Sit;Sit to Supine     Supine to sit: Total assist;+2 for physical assistance;HOB elevated Sit to supine: Total assist;+2 for physical assistance        Transfers Overall transfer level: Needs assistance Equipment used: Rolling walker (2 wheeled) Transfers: Sit to/from Stand Sit to Stand: Max assist;+2 physical assistance         General transfer comment: side-step towards HOB total A +2 with A for walker, weight shift and moving legs    Balance Overall balance assessment: Needs assistance Sitting-balance support: No upper extremity  supported;Feet supported   Sitting balance - Comments: poor to zero with posterior and left lateral lean                                   ADL either performed or assessed with clinical judgement   ADL                                         General ADL Comments: total A     Vision Baseline Vision/History:  (unknown) Patient Visual Report:  (unable to state)              Pertinent Vitals/Pain Pain Assessment: Faces Faces Pain Scale: No hurt     Hand Dominance  (unknown)   Extremity/Trunk Assessment Upper Extremity Assessment Upper Extremity Assessment: Generalized weakness           Communication Communication Communication: Expressive difficulties;Receptive difficulties   Cognition Arousal/Alertness: Lethargic Behavior During Therapy: Flat affect Overall Cognitive Status: No family/caregiver present to determine baseline cognitive functioning                                 General Comments: Only sounds, no words except "hello" x1; not following commands, eyes closed whole session   General Comments  Pt did awaken briefly when we attempted to put face mask on him due to mouth breathing  with sats 86-88% on 4 liters--wave form good. Realized it was a non-rebreather and not just face mask so removed. When we left room his sats were 94% on 4 liters.            Home Living Family/patient expects to be discharged to:: Private residence Living Arrangements: Alone                               Additional Comments: Per chart pt lives alone, put not coherent enough to answer questions and no family present               OT Problem List: Decreased strength;Decreased activity tolerance;Impaired balance (sitting and/or standing);Decreased safety awareness;Decreased cognition;Cardiopulmonary status limiting activity      OT Treatment/Interventions: Self-care/ADL training;DME and/or AE  instruction;Patient/family education;Balance training;Therapeutic exercise;Therapeutic activities    OT Goals(Current goals can be found in the care plan section) Acute Rehab OT Goals Patient Stated Goal: unable to state OT Goal Formulation: Patient unable to participate in goal setting Time For Goal Achievement: 08/22/20 Potential to Achieve Goals: Good  OT Frequency: Min 2X/week   Barriers to D/C: Decreased caregiver support          Co-evaluation PT/OT/SLP Co-Evaluation/Treatment: Yes Reason for Co-Treatment: Necessary to address cognition/behavior during functional activity;For patient/therapist safety PT goals addressed during session: Mobility/safety with mobility;Strengthening/ROM;Balance;Proper use of DME OT goals addressed during session: Strengthening/ROM;ADL's and self-care      AM-PAC OT "6 Clicks" Daily Activity     Outcome Measure Help from another person eating meals?: Total Help from another person taking care of personal grooming?: Total Help from another person toileting, which includes using toliet, bedpan, or urinal?: Total Help from another person bathing (including washing, rinsing, drying)?: Total Help from another person to put on and taking off regular upper body clothing?: Total Help from another person to put on and taking off regular lower body clothing?: Total 6 Click Score: 6   End of Session Equipment Utilized During Treatment: Gait belt;Rolling walker Nurse Communication: Mobility status (O2 level on 4 liters)  Activity Tolerance: Patient limited by lethargy Patient left: in bed;with call bell/phone within reach;with bed alarm set  OT Visit Diagnosis: Unsteadiness on feet (R26.81);Other abnormalities of gait and mobility (R26.89);Muscle weakness (generalized) (M62.81);Other symptoms and signs involving cognitive function                Time: 8413-2440 OT Time Calculation (min): 20 min Charges:  OT General Charges $OT Visit: 1 Visit OT  Evaluation $OT Eval Moderate Complexity: 1 Mod  Ignacia Palma, OTR/L Acute [employer] Pager [phone] Office [phone]     Evette Georges 08/08/2020, 8:34 AM

## 2020-08-08 NOTE — Progress Notes (Addendum)
Cardiology Progress Note  Patient ID: Mitchell King MRN: [id number] DOB: Jul 17, 1950 Date of Encounter: 08/08/2020  Primary Cardiologist: No primary care provider on file.  Subjective   Chief Complaint: Confused  HPI: Confused this a.m.  Not following questions.  Started on lactulose.  Ammonia level improved.  I have ordered an ABG.  CT head was negative.  Urine output is diminishing.  Overall volume status is improving.  ROS:  All other ROS reviewed and negative. Pertinent positives noted in the HPI.     Inpatient Medications  Scheduled Meds: . acetaZOLAMIDE  500 mg Oral Q12H  . carvedilol  25 mg Oral BID WC  . lactulose  20 g Oral BID  . losartan  100 mg Oral Daily  . sodium chloride flush  3 mL Intravenous Q12H   Continuous Infusions: . sodium chloride    . furosemide 120 mg (08/07/20 2004)  . heparin 1,700 Units/hr (08/08/20 0451)   PRN Meds: sodium chloride, acetaminophen, albuterol, guaifenesin, hydrALAZINE, ondansetron (ZOFRAN) IV, sodium chloride flush   Vital Signs   Vitals:   08/07/20 1806 08/07/20 1946 08/08/20 0200 08/08/20 0558  BP:  105/68  124/76  Pulse:  (!) 54  (!) 52  Resp: Temp:  97.9 F (36.6 C)  98.2 F (36.8 C)  TempSrc:  Oral    SpO2:  97%  96%  Weight:   83.2 kg   Height:        Intake/Output Summary (Last 24 hours) at 08/08/2020 0859 Last data filed at 08/08/2020 0200 Gross per 24 hour  Intake 381.35 ml  Output 1800 ml  Net -1418.65 ml   Last 3 Weights 08/08/2020 08/07/2020 08/06/2020  Weight (lbs) 183 lb 6.8 oz 193 lb 11.2 oz 179 lb 0.2 oz  Weight (kg) 83.2 kg 87.862 kg 81.2 kg      Telemetry  Overnight telemetry shows sinus bradycardia heart rate in the 50s, which I personally reviewed.   Physical Exam   Vitals:   08/07/20 1806 08/07/20 1946 08/08/20 0200 08/08/20 0558  BP:  105/68  124/76  Pulse:  (!) 54  (!) 52  Resp: Temp:  97.9 F (36.6 C)  98.2 F (36.8 C)  TempSrc:  Oral    SpO2:  97%  96%   Weight:   83.2 kg   Height:         Intake/Output Summary (Last 24 hours) at 08/08/2020 0859 Last data filed at 08/08/2020 0200 Gross per 24 hour  Intake 381.35 ml  Output 1800 ml  Net -1418.65 ml    Last 3 Weights 08/08/2020 08/07/2020 08/06/2020  Weight (lbs) 183 lb 6.8 oz 193 lb 11.2 oz 179 lb 0.2 oz  Weight (kg) 83.2 kg 87.862 kg 81.2 kg    Body mass index is 24.2 kg/m.   General: Ill-appearing, not following commands Head: Atraumatic, normal size  Eyes: PEERLA, EOMI  Neck: Supple, JVD 12 to 15 cm of water Endocrine: No thryomegaly Cardiac: Normal S1, S2; RRR; no murmurs, rubs, or gallops Lungs: Diminished breath sounds bilaterally Abd: Distended abdomen, nontender Ext: 1+ edema Musculoskeletal: No deformities Skin: Warm and dry, no rashes   Neuro: Awake, drowsy, not answering questions, not following commands, moving all extremities spontaneously  Labs  High Sensitivity Troponin:   Recent Labs  Lab 08/04/20 2003  TROPONINIHS 44*     Cardiac EnzymesNo results for input(s): TROPONINI in the last 168 hours. No results for input(s): TROPIPOC in  the last 168 hours.  Chemistry Recent Labs  Lab 08/06/20 0353 08/07/20 0311 08/07/20 0951 08/08/20 0321  NA 137 136  --  137  K 4.7 4.5  --  4.0  CL 93* 94*  --  91*  CO2 34* 33*  --  39*  GLUCOSE 113* 137*  --  121*  BUN 23 30*  --  35*  CREATININE 0.94 1.04  --  1.20  CALCIUM 9.2 8.6*  --  9.0  PROT 7.8  --  7.1 7.4  ALBUMIN 3.1*  --  2.9* 3.1*  AST 30  --  29 28  ALT 15  --  15 16  ALKPHOS 66  --  60 62  BILITOT 2.1*  --  1.3* 1.9*  GFRNONAA >60 >60  --  >60  ANIONGAP 10 9  --  7    Hematology Recent Labs  Lab 08/06/20 0353 08/07/20 0311 08/08/20 0321  WBC 4.4 5.3 3.8*  RBC 5.37 4.94 4.95  HGB 16.4 15.2 15.4  HCT 54.3* 50.4 50.4  MCV 101.1* 102.0* 101.8*  MCH 30.5 30.8 31.1  MCHC 30.2 30.2 30.6  RDW 14.2 14.0 14.1  PLT 222 190 205   BNP Recent Labs  Lab 08/04/20 2003  BNP 690.9*    DDimer   Recent Labs  Lab 08/05/20 2037  DDIMER 2.46*     Radiology  CT HEAD WO CONTRAST  Result Date: 08/07/2020 CLINICAL DATA:  Mental status change with unknown cause EXAM: CT HEAD WITHOUT CONTRAST TECHNIQUE: Contiguous axial images were obtained from the base of the skull through the vertex without intravenous contrast. COMPARISON:  04/20/2017 FINDINGS: Brain: No evidence of acute infarction, hemorrhage, hydrocephalus, extra-axial collection or mass lesion/mass effect. Vermian atrophy. There is history of alcohol abuse. Vascular: No hyperdense vessel or unexpected calcification. Skull: Normal. Negative for fracture or focal lesion. Sinuses/Orbits: No acute finding. 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 Electronically Signed   By: Marnee Spring M.D.   On: 08/07/2020 04:44   VAS [place] LOWER EXTREMITY VENOUS (DVT)  Result Date: 08/06/2020  Lower Venous DVT Study Indications: Pulmonary embolism, SOB, bilateral swelling.  Comparison Study: 08-05-2020 CTA chest was positive for pulmonary embolism. Performing Technologist: Jean Rosenthal RDMS  Examination Guidelines: A complete evaluation includes B-mode imaging, spectral Doppler, color Doppler, and power Doppler as needed of all accessible portions of each vessel. Bilateral testing is considered an integral part of a complete examination. Limited examinations for reoccurring indications may be performed as noted. The reflux portion of the exam is performed with the patient in reverse Trendelenburg.  +---------+---------------+---------+-----------+----------+--------------+ RIGHT    CompressibilityPhasicitySpontaneityPropertiesThrombus Aging +---------+---------------+---------+-----------+----------+--------------+ CFV      Full           No       Yes                                 +---------+---------------+---------+-----------+----------+--------------+ SFJ       Full                                                        +---------+---------------+---------+-----------+----------+--------------+ FV Prox  Full                                                        +---------+---------------+---------+-----------+----------+--------------+  FV Mid   Full                                                        +---------+---------------+---------+-----------+----------+--------------+ FV DistalFull                                                        +---------+---------------+---------+-----------+----------+--------------+ PFV      Full                                                        +---------+---------------+---------+-----------+----------+--------------+ POP      Full           No       Yes                                 +---------+---------------+---------+-----------+----------+--------------+ PTV      Full                                                        +---------+---------------+---------+-----------+----------+--------------+ PERO     Full                                                        +---------+---------------+---------+-----------+----------+--------------+   +---------+---------------+---------+-----------+----------+--------------+ LEFT     CompressibilityPhasicitySpontaneityPropertiesThrombus Aging +---------+---------------+---------+-----------+----------+--------------+ CFV      Full           No       Yes                                 +---------+---------------+---------+-----------+----------+--------------+ SFJ      Full                                                        +---------+---------------+---------+-----------+----------+--------------+ FV Prox  Full                                                        +---------+---------------+---------+-----------+----------+--------------+ FV Mid   Full                                                         +---------+---------------+---------+-----------+----------+--------------+  FV DistalFull                                                        +---------+---------------+---------+-----------+----------+--------------+ PFV      Full                                                        +---------+---------------+---------+-----------+----------+--------------+ POP      Full           No       Yes                                 +---------+---------------+---------+-----------+----------+--------------+ PTV      Full                                                        +---------+---------------+---------+-----------+----------+--------------+ PERO     Full                                                        +---------+---------------+---------+-----------+----------+--------------+     Summary: RIGHT: - There is no evidence of deep vein thrombosis in the lower extremity.  - No cystic structure found in the popliteal fossa.  - Pulsatile waveforms consistent with elevated right heart pressures.  LEFT: - There is no evidence of deep vein thrombosis in the lower extremity.  - No cystic structure found in the popliteal fossa.  - Pulsatile waveforms consistent with elevated right heart pressures.  *See table(s) above for measurements and observations. Electronically signed by Coral Else MD on 08/06/2020 at 9:47:18 PM.    Final    US Abdomen Limited RUQ (LIVER/GB)  Result Date: 08/07/2020 CLINICAL DATA:  Elevated liver enzymes EXAM: ULTRASOUND ABDOMEN LIMITED RIGHT UPPER QUADRANT COMPARISON:  Abdominal ultrasound August 26, 2009 FINDINGS: Gallbladder: The gallbladder is contracted and filled with calculi. Largest individual gallstone measures approximately 1 cm in length. Gallbladder wall is thickened without edema or pericholecystic fluid. No sonographic Murphy sign noted by sonographer. Common bile duct: Diameter: 5 mm. No intrahepatic or extrahepatic  biliary duct dilatation. Liver: No focal lesion identified. Liver echogenicity overall is increased. Portal vein is patent on color Doppler imaging with normal direction of blood flow towards the liver. Other: There is mild ascites.  Small right pleural effusion. IMPRESSION: 1. Gallbladder is contracted and filled with calculi. Gallbladder wall mildly thickened without gallbladder wall edema. No pericholecystic fluid. 2. Increased liver echogenicity, a finding likely indicative of hepatic steatosis, potentially with underlying parenchymal liver disease. No focal liver lesions are demonstrable on this study. 3.  Mild ascites. 4.  Small right pleural effusion. Electronically Signed   By: Bretta Bang III M.D.   On: 08/07/2020 16:03    Cardiac Studies  TTE 08/05/2020 1. Left  ventricular ejection fraction, by estimation, is 55 to 60%. The  left ventricle has normal function. The left ventricle has no regional  wall motion abnormalities. There is mild concentric left ventricular  hypertrophy. Left ventricular diastolic  parameters were normal. There is the interventricular septum is flattened  in systole, consistent with right ventricular pressure overload.  2. Right ventricular systolic function is normal. The right ventricular  size is severely enlarged. Moderately increased right ventricular wall  thickness. There is mildly elevated pulmonary artery systolic pressure.  The estimated right ventricular  systolic pressure is 44.4 mmHg (suspect this is an underestimation due to  the faint TR jet).  3. Right atrial size was severely dilated.  4. The mitral valve is normal in structure. No evidence of mitral valve  regurgitation. No evidence of mitral stenosis.  5. The aortic valve is tricuspid. Aortic valve regurgitation is not  visualized. No aortic stenosis is present.  6. The inferior vena cava is dilated in size with <50% respiratory  variability, suggesting right atrial pressure of 15  mmHg.   CT PE Study  1. Several subsegmental pulmonary emboli in the pulmonary arteries supplying the bilateral upper lobes and the left lower lobe. 2. Moderate bilateral pleural effusions, right greater than left, with associated atelectasis. 3. Aspirated debris and bronchial wall thickening in the right lower lobe likely reflect chronic aspiration. 4. Enlarged pulmonary artery likely representing pulmonary hypertension. Mild cardiomegaly. Patient Profile  Mitchell King is a 70 y.o. male with history of hypertension, COPD, diabetes who was admitted on 08/04/2020 with congestive heart failure.  He is found to have severe RV enlargement with reduced function in the setting of several pulmonary emboli.  Course complicated on 08/07/2020 by altered mentation/encephalopathy.  Assessment & Plan   1.  Right heart failure/cor pulmonale -Admitted with 2-week history of shortness of breath and lower extremity edema.  Found to have multiple subsegmental PEs in several lobes.  Echocardiogram with severely dilated RV and reduced function on my review.  Symptoms are more consistent with right heart failure. -He was hypoxic on admission which was related to his pulmonary emboli -He has been diuresed and is net -10.2 L.  He still has JVD I suspect this is just related to his pulmonary hypertension.  Lower extremity edema is much improved. -Creatinine did slightly bump today.  I would like to hold IV diuresis today.  He is encephalopathic and this is concerning.  See discussion below. -Ultimately, he will need a left and right heart cath but his mentation needs improvement first.  2.  Encephalopathy -More confused today.  Ammonia level 75 yesterday started on lactulose.  Liver ultrasound with increased liver echogenicity and steatosis.  There is concern for possible underlying parenchymal liver disease.  I suspect some of this is congestion.  He may have cirrhosis.   -CT head negative. -Kidney function  stable and he is not uremic. -He does have a history of smoking and I have ordered an ABG to exclude hypercarbia.  Given his smoking history and hyperammonemia he may be hypercarbic.  3.  Metabolic alkalosis secondary to diuresis -I will give him Diamox again today to help with this.  For questions or updates, please contact [facility] Please consult [email or web address] for contact info under   Time Spent with Patient: I have spent a total of 25 minutes with patient reviewing hospital notes, telemetry, EKGs, labs and examining the patient as well as establishing an assessment and plan that was discussed with the  patient.  > 50% of time was spent in direct patient care.    Signed, Lenna Gilford. Flora Lipps, MD [facility]  [facility]  08/08/2020 8:59 AM

## 2020-08-08 NOTE — Evaluation (Signed)
Physical Therapy Evaluation Patient Details Name: LUISFERNANDO BRIGHTWELL MRN: [id number] DOB: [birth year] Today's Date: 08/08/2020   History of Present Illness  ACESON LABELL is a 70 y.o. male with PMH significant of diet-controlled diabetes, COPD, HTN, previous alcohol abuse, who has not seen doctors in years and has not been taking his medications for a while except for inhalers. Presents with progressive SOB, cough, PND and orthopnea as well as LE edema over the last 2 weeks. Found to have acute exacerbation of CHF secondary to HTN heart disease, acute PEs, acute respiratory failure and metabolic encephalopathy.  Clinical Impression  Patient presents with lethargy, decreased activity tolerance, generalized weakness, impaired cardiorespiratory status, decreased balance and impaired mobility s/p above. Not sure of PLOF/history as pt not able to communicate or answer questions today due to level of arousal/confusion. Today, pt requires total A for bed mobility and Max A of 2 for transfers and taking a few steps along side bed with RW for support. Not following any commands today with only 1 verbalization of "hello." Kept eyes closed for most of session. Sp02 ranging 84-85% on RA at rest on 4L/min 02 [place]. Rn made aware. Improved to 94% upon PT departure. Would benefit from SNF to maximize independence and mobility prior to return home. If pt able to progress well and has necessary support at home, may become appropriate to d/c home. Will follow acutely to assess progression.    Follow Up Recommendations SNF;Supervision for mobility/OOB    Equipment Recommendations  Other (comment) (TBA pending needs/progression/dispo)    Recommendations for Other Services       Precautions / Restrictions Precautions Precautions: Fall Precaution Comments: monitor O2 sats Restrictions Weight Bearing Restrictions: No      Mobility  Bed Mobility Overal bed mobility: Needs Assistance Bed Mobility: Supine to Sit;Sit  to Supine     Supine to sit: Total assist;+2 for physical assistance;HOB elevated Sit to supine: Total assist;+2 for physical assistance   General bed mobility comments: No initiation of movement due to lethargy and not following commands.    Transfers Overall transfer level: Needs assistance Equipment used: Rolling walker (2 wheeled) Transfers: Sit to/from Stand Sit to Stand: Max assist;+2 physical assistance         General transfer comment: Assist to place UEs on RW and stand from EOB with posterior lean and narrow BoS. Able to side step along side bed with assist for weight shifting, RW management and balance.  Ambulation/Gait             General Gait Details: Unable  Information systems manager [place] (Stroke Patients Only)       Balance Overall balance assessment: Needs assistance Sitting-balance support: No upper extremity supported;Feet supported Sitting balance-Leahy Scale: Poor Sitting balance - Comments: poor to zero with posterior and left lateral lean Postural control: Posterior lean;Left lateral lean Standing balance support: During functional activity Standing balance-Leahy Scale: Poor Standing balance comment: Requires UE and external support for balance.                             Pertinent Vitals/Pain Pain Assessment: Faces Faces Pain Scale: No hurt    Home Living Family/patient expects to be discharged to:: Private residence Living Arrangements: Alone               Additional Comments: Per chart pt lives alone,  put not coherent enough to answer questions and no family present    Prior Function                 Hand Dominance   Dominant Hand:  (unknown)    Extremity/Trunk Assessment   Upper Extremity Assessment Upper Extremity Assessment: Defer to OT evaluation    Lower Extremity Assessment Lower Extremity Assessment: Generalized weakness;Difficult to assess due to impaired  cognition       Communication   Communication: Expressive difficulties;Receptive difficulties (no verbalizations today)  Cognition Arousal/Alertness: Lethargic Behavior During Therapy: Flat affect Overall Cognitive Status: No family/caregiver present to determine baseline cognitive functioning                                 General Comments: Only sounds, no words except "hello" x1; not following commands, eyes closed whole session.      General Comments General comments (skin integrity, edema, etc.): Awakened briefly when attempted to place non rebreather mask on him as 02 sats at 84-85% on 4L 02 [place] at rest. Improved to 94% upon departure. RN notified.    Exercises     Assessment/Plan    PT Assessment Patient needs continued PT services  PT Problem List Decreased strength;Decreased mobility;Decreased safety awareness;Decreased balance;Decreased cognition;Decreased activity tolerance;Cardiopulmonary status limiting activity       PT Treatment Interventions Therapeutic exercise;Gait training;Balance training;DME instruction;Patient/family education;Therapeutic activities;Cognitive remediation;Functional mobility training;Wheelchair mobility training    PT Goals (Current goals can be found in the Care Plan section)  Acute Rehab PT Goals Patient Stated Goal: unable to state PT Goal Formulation: Patient unable to participate in goal setting Time For Goal Achievement: 08/22/20 Potential to Achieve Goals: Fair    Frequency Min 2X/week   Barriers to discharge Other (comment) unsure    Co-evaluation PT/OT/SLP Co-Evaluation/Treatment: Yes Reason for Co-Treatment: Necessary to address cognition/behavior during functional activity;For patient/therapist safety PT goals addressed during session: Mobility/safety with mobility;Balance;Strengthening/ROM;Proper use of DME OT goals addressed during session: Strengthening/ROM;ADL's and self-care       AM-PAC PT "6  Clicks" Mobility  Outcome Measure Help needed turning from your back to your side while in a flat bed without using bedrails?: A Lot Help needed moving from lying on your back to sitting on the side of a flat bed without using bedrails?: Total Help needed moving to and from a bed to a chair (including a wheelchair)?: A Lot Help needed standing up from a chair using your arms (e.g., wheelchair or bedside chair)?: Total Help needed to walk in hospital room?: Total Help needed climbing 3-5 steps with a railing? : Total 6 Click Score: 8    End of Session Equipment Utilized During Treatment: Gait belt Activity Tolerance: Patient limited by lethargy Patient left: in bed;with call bell/phone within reach;with bed alarm set Nurse Communication: Mobility status;Other (comment) (02 sats) PT Visit Diagnosis: Muscle weakness (generalized) (M62.81);Unsteadiness on feet (R26.81);Difficulty in walking, not elsewhere classified (R26.2)    Time: 8242-3536 PT Time Calculation (min) (ACUTE ONLY): 26 min   Charges:   PT Evaluation $PT Eval Moderate Complexity: 1 Mod          Vale Haven, PT, DPT Acute Rehabilitation Services Pager [phone] Office [phone]      Blake Divine A Lanier Ensign 08/08/2020, 12:14 PM

## 2020-08-09 ENCOUNTER — Inpatient Hospital Stay (HOSPITAL_COMMUNITY): Payer: Medicare Other

## 2020-08-09 DIAGNOSIS — J9601 Acute respiratory failure with hypoxia: Secondary | ICD-10-CM

## 2020-08-09 DIAGNOSIS — J9600 Acute respiratory failure, unspecified whether with hypoxia or hypercapnia: Secondary | ICD-10-CM

## 2020-08-09 DIAGNOSIS — I2609 Other pulmonary embolism with acute cor pulmonale: Secondary | ICD-10-CM | POA: Diagnosis not present

## 2020-08-09 DIAGNOSIS — I2601 Septic pulmonary embolism with acute cor pulmonale: Secondary | ICD-10-CM | POA: Diagnosis not present

## 2020-08-09 DIAGNOSIS — I5023 Acute on chronic systolic (congestive) heart failure: Secondary | ICD-10-CM | POA: Diagnosis not present

## 2020-08-09 DIAGNOSIS — I50811 Acute right heart failure: Secondary | ICD-10-CM | POA: Diagnosis not present

## 2020-08-09 LAB — COMPREHENSIVE METABOLIC PANEL
ALT: 14 U/L (ref 0–44)
AST: 26 U/L (ref 15–41)
Albumin: 3 g/dL — ABNORMAL LOW (ref 3.5–5.0)
Alkaline Phosphatase: 57 U/L (ref 38–126)
Anion gap: 11 (ref 5–15)
BUN: 43 mg/dL — ABNORMAL HIGH (ref 8–23)
CO2: 33 mmol/L — ABNORMAL HIGH (ref 22–32)
Calcium: 9.1 mg/dL (ref 8.9–10.3)
Chloride: 96 mmol/L — ABNORMAL LOW (ref 98–111)
Creatinine, Ser: 1.24 mg/dL (ref 0.61–1.24)
GFR, Estimated: >60 mL/min (ref >60)
Glucose, Bld: 80 mg/dL (ref 70–99)
Potassium: 3.6 mmol/L (ref 3.5–5.1)
Sodium: 140 mmol/L (ref 135–145)
Total Bilirubin: 2.5 mg/dL — ABNORMAL HIGH (ref 0.3–1.2)
Total Protein: 7.4 g/dL (ref 6.5–8.1)

## 2020-08-09 LAB — MAGNESIUM
Magnesium: 2.5 mg/dL — ABNORMAL HIGH (ref 1.7–2.4)
Magnesium: 2.2 mg/dL (ref 1.7–2.4)

## 2020-08-09 LAB — CBC
HCT: 52.4 % — ABNORMAL HIGH (ref 39.0–52.0)
Hemoglobin: 15.9 g/dL (ref 13.0–17.0)
MCH: 30.1 pg (ref 26.0–34.0)
MCHC: 30.3 g/dL (ref 30.0–36.0)
MCV: 99.2 fL (ref 80.0–100.0)
Platelets: 189 10*3/uL (ref 150–400)
RBC: 5.28 MIL/uL (ref 4.22–5.81)
RDW: 13.9 % (ref 11.5–15.5)
WBC: 3.6 10*3/uL — ABNORMAL LOW (ref 4.0–10.5)
nRBC: 0 % (ref 0.0–0.2)

## 2020-08-09 LAB — PHOSPHORUS
Phosphorus: 2 mg/dL — ABNORMAL LOW (ref 2.5–4.6)
Phosphorus: 3.5 mg/dL (ref 2.5–4.6)

## 2020-08-09 LAB — GLUCOSE, CAPILLARY
Glucose-Capillary: 71 mg/dL (ref 70–99)
Glucose-Capillary: 89 mg/dL (ref 70–99)
Glucose-Capillary: 83 mg/dL (ref 70–99)
Glucose-Capillary: 93 mg/dL (ref 70–99)
Glucose-Capillary: 109 mg/dL — ABNORMAL HIGH (ref 70–99)
Glucose-Capillary: 138 mg/dL — ABNORMAL HIGH (ref 70–99)

## 2020-08-09 LAB — HEPARIN LEVEL (UNFRACTIONATED): Heparin Unfractionated: 0.47 IU/mL (ref 0.30–0.70)

## 2020-08-09 LAB — LACTIC ACID, PLASMA: Lactic Acid, Venous: 1.2 mmol/L (ref 0.5–1.9)

## 2020-08-09 MED ORDER — FREE WATER
100.0000 mL | Freq: Four times a day (QID) | Status: DC
  Administered 2020-08-09 – 2020-08-10 (×3): 100 mL

## 2020-08-09 MED ORDER — PROSOURCE TF PO LIQD
45.0000 mL | Freq: Three times a day (TID) | ORAL | Status: DC
  Administered 2020-08-09 – 2020-08-10 (×3): 45 mL
  Filled 2020-08-09 (×4): qty 45

## 2020-08-09 MED ORDER — OSMOLITE 1.2 CAL PO LIQD
1000.0000 mL | ORAL | Status: DC
  Administered 2020-08-09: 1000 mL
  Filled 2020-08-09 (×5): qty 1000

## 2020-08-09 MED ORDER — POTASSIUM CHLORIDE 20 MEQ PO PACK
20.0000 meq | PACK | Freq: Once | ORAL | Status: AC
  Administered 2020-08-09: 20 meq
  Filled 2020-08-09: qty 1

## 2020-08-09 MED ORDER — METHYLPREDNISOLONE SODIUM SUCC 40 MG IJ SOLR
40.0000 mg | Freq: Two times a day (BID) | INTRAMUSCULAR | Status: DC
  Administered 2020-08-09 (×2): 40 mg via INTRAVENOUS
  Filled 2020-08-09 (×2): qty 1

## 2020-08-09 MED ORDER — PROSOURCE TF PO LIQD
45.0000 mL | Freq: Two times a day (BID) | ORAL | Status: DC
  Administered 2020-08-09: 45 mL
  Filled 2020-08-09: qty 45

## 2020-08-09 MED ORDER — VITAL HIGH PROTEIN PO LIQD
1000.0000 mL | ORAL | Status: AC
  Administered 2020-08-09: 1000 mL

## 2020-08-09 MED ORDER — LOSARTAN POTASSIUM 50 MG PO TABS
100.0000 mg | ORAL_TABLET | Freq: Every day | ORAL | Status: DC
  Administered 2020-08-09 – 2020-08-10 (×2): 100 mg
  Filled 2020-08-09: qty 2

## 2020-08-09 MED ORDER — CARVEDILOL 12.5 MG PO TABS: 12.5000 mg | ORAL_TABLET | Freq: Two times a day (BID) | ORAL | Status: DC

## 2020-08-09 MED ORDER — BUDESONIDE 0.5 MG/2ML IN SUSP
0.5000 mg | Freq: Two times a day (BID) | RESPIRATORY_TRACT | Status: DC
  Administered 2020-08-09 – 2020-08-16 (×16): 0.5 mg via RESPIRATORY_TRACT
  Filled 2020-08-09 (×16): qty 2

## 2020-08-09 MED ORDER — LACTULOSE 10 GM/15ML PO SOLN
30.0000 g | Freq: Two times a day (BID) | ORAL | Status: DC
  Administered 2020-08-09 – 2020-08-10 (×3): 30 g
  Filled 2020-08-09 (×3): qty 45

## 2020-08-09 NOTE — Progress Notes (Signed)
Called eLink Camryn, RN re: At 0030 Patient's abd is distended has OG to low intermittent suction.0400  Patent's abd is now more taut and winces when I apply pressure.

## 2020-08-09 NOTE — Progress Notes (Signed)
5701 Radiology called to inform they are sending to transport for patient's stat abd CT.  Called RT to  [facility] for transport.    7793 Called CT requesting ETA for transport.  Unknown when transport will be at bedside.

## 2020-08-09 NOTE — Progress Notes (Signed)
ANTICOAGULATION CONSULT NOTE - Follow Up Consult  Pharmacy Consult for Heparin Indication: pulmonary embolus  No Known Allergies  Patient Measurements: Height: 6' (182.9 cm) Weight: 81.1 kg (178 lb 12.7 oz) IBW/kg (Calculated) : 77.6  Vital Signs: Temp: 97.1 F (36.2 C) (02/19 0754) Temp Source: Axillary (02/19 0754) BP: 131/70 (02/19 0753) Pulse Rate: 52 (02/19 0753)  Labs: Recent Labs    08/07/20 0311 08/08/20 0321 08/08/20 1452 08/08/20 1610 08/08/20 1751 08/09/20 0059  HGB 15.2 15.4  --  18.4* 17.7* 15.9  HCT 50.4 50.4  --  54.0* 52.0 52.4*  PLT 190 205  --   --   --  189  LABPROT  --   --  14.0  --   --   --   INR  --   --  1.1  --   --   --   HEPARINUNFRC 0.35 0.54  --   --   --  0.47  CREATININE 1.04 1.20  --   --   --   --     Estimated Creatinine Clearance: 62.9 mL/min (by C-G formula based on SCr of 1.2 mg/dL).  Assessment: 70 year old male to begin heparin for several scattered subsegmetal PE's. Negative for DVT. No AC PTA.  Heparin level is therapeutic at 0.47, on 1700 units/hr. Hgb, plt stable. No s/sx of bleeding or infusion issues.   Goal of Therapy:  Heparin level 0.3-0.7 units/ml Monitor platelets by anticoagulation protocol: Yes   Plan:  Continue heparin drip to 1700 units / hr Daily heparin level, CBC F/u plans for PO [facility]  Tama Headings, PharmD, BCPS PGY2 Cardiology Pharmacy Resident Phone: [phone] 08/09/2020  11:27 AM  Please check [email or web address] for unit-specific pharmacy phone numbers.

## 2020-08-09 NOTE — Progress Notes (Signed)
NAME:  Mitchell King, MRN:  [id number], DOB:  1951/04/13, LOS: 5 ADMISSION DATE:  08/04/2020, CONSULTATION DATE:  2/18 REFERRING MD:  Dr. Jacqulyn Bath, CHIEF COMPLAINT: Hypercapnic respiratory failure  Brief History:  70 year old male initially presented with progressive shortness of breath, cough, and orthopnea.  Seen with worsening hypercapnic respiratory failure morning of 2/18 prompting PCCM consult   Past Medical History:  Previous alcohol abuse Type 2 diabetes COPD Hypertension Tobacco abuse  Significant Hospital Events:  Admitted 2/14 2/14 through 2/18: Treated by cardiology with aggressive diuresis for hypervolemia.  Developed worsening encephalopathy on 2/17 and slowly progressive respiratory acidosis transferred to the intensive care intubated after failing BiPAP 2/19: Consults:  Cardiology  Procedures:    Significant Diagnostic Tests:  CTA chest 2/15 > several subsegmental pulmonary emboli in the pulmonary arteries, moderate bilateral pleural effusion right greater than left, aspirated debris and bronchial wall thickening of the right lower lobe, and enlarged pulmonary artery  Head CT 2/17pain > negative  Abdominal ultrasound 2/17 > multiple gallstones with mild thickening of gallbladder wall without edema, hepatic steatosis CT of abdomen 2/19 showed distended urinary bladder reaching the umbilicus causing bilateral hydronephrosis, aortic arterial sclerosis, anasarca, lower lobe atelectasis with small effusions and cholelithiasis Micro Data:  Covid 2/15 > negative  Antimicrobials:     Interim History / Subjective:   Sedated on fentanyl hemodynamically stable but somewhat bradycardic Objective   Blood pressure 131/70, pulse (Abnormal) 52, temperature (Abnormal) 97.1 F (36.2 C), temperature source Axillary, resp. rate 20, height 6' (1.829 m), weight 81.1 kg, SpO2 99 %.    Vent Mode: PRVC FiO2 (%):  [40 %-60 %] 40 % Set Rate:  [20 bmp] 20 bmp Vt Set:  [620 mL]  620 mL PEEP:  [5 cmH20] 5 cmH20 Plateau Pressure:  [14 cmH20-22 cmH20] 16 cmH20   Intake/Output Summary (Last 24 hours) at 08/09/2020 0851 Last data filed at 08/09/2020 0700 Gross per 24 hour  Intake 762.06 ml  Output 750 ml  Net 12.06 ml   Filed Weights   08/07/20 0029 08/08/20 0200 08/09/20 0437  Weight: 87.9 kg 83.2 kg 81.1 kg    Examination: General 70 year old black male sedated on mechanical ventilator HEENT normocephalic atraumatic orally intubated OG tube in place Pulmonary: Diffuse prolonged expiratory wheezing with coarse breath sounds bilaterally. Current tidal volume 620 respiratory rate 20 PEEP 5 FiO2 40% Plateau pressure 23 but peak inspiratory pressure exceeding mid 35 range saturations currently 100% Cardiac: Bradycardic rhythm regular rate Abdomen: Distended and firm winces to lower abdominal palpation GU: Condom catheter in place Extremities: Warm dry brisk capillary refill pitting edema persists Neuro: Heavily sedated  Resolved Hospital Problem list     Assessment & Plan:  Acute decompensated right heart failure -Patient presented with progressive shortness of breath, anasarca, and orthopnea -Echo revealed severely dilated RV and RA consistent with right heart failure, preserved EF Plan Continue to push diuresis Continue telemetry monitoring Additional recommendations per advanced heart failure team   Acute Hypoxic and hypercapnic Respiratory Failure Secondary to decompensated right heart failure secondary, pulmonary emboli, volume overload, small to moderate right pleural effusion with atelectasis and  COPD exacerbation Active smoker  Portable chest x-ray reviewed post intubation shows bilateral right greater than left airspace disease with right-sided effusion on CT imaging this morning base shows right greater than left small effusions with atelectasis  Plan  treating pulmonary emboli with IV heparin Continue full ventilator support PAD protocol RASS  goal -2 VAP bundle Continuing IV diuresis Weaning  PEEP/FiO2  for saturations greater than 90% 9 continue scheduled bronchodilators Add inhaled corticosteroids and solumedrol 40mg  q 12 Am cxr   Several subsegmental PE -Seen on CTA 2/15, family reports decreased mobility prior to admission due to lower extremity edema, seems like these may be chronic Plan Continue IV heparin  Acute metabolic encephalopathy -Secondary to significant hypercapnia with possible underlying hospital delirium as well -Head CT 2/16, liver enzymes, and ammonia level within normal limits Plan Supportive care PAD protocol Cont lactulose   Hx of HTN  Plan Continue losartan and Coreg As needed hydralazine  Urinary retention Plan Place Foley catheter   HX of type 2 diabetes -Managed by diet PTA, Hgb A1C 6.2, currently normoglycemic Plan Sliding scale insulin  Elevated total bilirubin Cholelithiasis without acute Cholelithiasis LFTs WNL  Plan Trend LFTs Best practice (evaluated daily)  Diet: NPO--.tubefeeds 2/19 Pain/Anxiety/Delirium protocol (if indicated): fent gtt VAP protocol (if indicated): In place  DVT prophylaxis: IV heparin GI prophylaxis: PPI Glucose control: SSi Mobility: Bedrest  Disposition:ICU  Goals of Care:  Last date of multidisciplinary goals of care discussion:Pending  Family and staff present: Pending  Summary of discussion: Pending  Follow up goals of care discussion due: Pending  Code Status: Full   Critical care time: 32 min   Performed by: 3/19

## 2020-08-09 NOTE — Progress Notes (Signed)
eLink Physician-Brief Progress Note Patient Name: Mitchell King DOB: [birth year] MRN: [id number]   Date of Service  08/09/2020  HPI/Events of Note  Patient with rapidly evolving taut abdominal distension associated with pain on palpation.  eICU Interventions  NPO status, discontinue Lactulose, stat non contrast abdominal CT to r/o acute abdomen.        Thomasene Lot [name] 08/09/2020, 5:12 AM

## 2020-08-09 NOTE — Progress Notes (Signed)
Notified by MD of new orders.  Patient NPO, Stat CT of abd and lactulose d/c'd

## 2020-08-09 NOTE — Progress Notes (Signed)
Patient very agitated in CT administered boluses of fentanyl to obtain stat head CT.

## 2020-08-09 NOTE — Progress Notes (Signed)
Initial Nutrition Assessment  RD working remotely.  DOCUMENTATION CODES:   Not applicable  INTERVENTION:  - will adjust TF regimen: Osmolite 1.2 @ 60 ml/hr with 45 ml Prosource TF TID and 100 ml free water QID. - this regimen will provide 1848 kcal, 113 grams protein, and 1581 ml free water.    NUTRITION DIAGNOSIS:   Inadequate oral intake related to inability to eat as evidenced by NPO status.  GOAL:   Patient will meet greater than or equal to 90% of their needs  MONITOR:   Vent status,TF tolerance,Labs,Weight trends  REASON FOR ASSESSMENT:   Ventilator,Consult Enteral/tube feeding initiation and management  ASSESSMENT:   70 y.o. male with medical history of diet-controlled DM, COPD, HTN, previous alcohol abuse, who has not seen doctors in years and has not been taking his medications for a while except for inhalers. He presented to the ED due to progressive SOB, cough, and orthopnea x2 weeks. He also noted worsening BLE edema.  Patient was intubated yesterday (2/18) at around 1645. OGT placed at that time (CT abdomen/pelvis report from today states tip is gastric).   He has been started on TF per protocol: Vital High Protein @ 40 ml/hr with 45 ml Prosource TF BID.   Patient was previously on a Heart Healthy diet 2/14-2/17, then on Dysphagia 3, thin liquids from 2/17 at 1533 until the time of intubation. The only documented intakes in the chart are 75% of breakfast, 95% of lunch, and 100% of dinner on 2/16; 60% of dinner on 2/17; 0% of breakfast on 2/18.   Weight today isI 179 lb and weight on 2/15 (admission) was 180.5 lb. PTA the most recently documented weight was on 05/05/17 when he weighed 180 lb.   Mild pitting edema to BLE documented in the edema section of flow sheet.    Patient is currently intubated on ventilator support MV: 12 L/min Temp (24hrs), Avg:97.5 F (36.4 C), Min:96.8 F (36 C), Max:98.4 F (36.9 C) Propofol: none  Labs reviewed; CBGs: 71, 89,  83, 93 mg/dl, Cl: 96 mmol/l, BUN: 43 mg/dl, Phos: 2 mg/dl, Mg: 2.5 mg/dl.  Medications reviewed; 80 mg IV lasix BID, sliding scale novolog, 30 g lactulose BID, 40 mg solu-medrol BID, 17 g miralax/day, 20 mEq Klor-Con x1 dose 2/19.     NUTRITION - FOCUSED PHYSICAL EXAM:  unable to complete at this time.   Diet Order:   Diet Order            Diet NPO time specified  Diet effective now                 EDUCATION NEEDS:   Not appropriate for education at this time  Skin:  Skin Assessment: Reviewed RN Assessment  Last BM:  PTA/unknown  Height:   Ht Readings from Last 1 Encounters:  08/08/20 6' (1.829 m)    Weight:   Wt Readings from Last 1 Encounters:  08/09/20 81.1 kg     Estimated Nutritional Needs:  Kcal:  1872 kcal Protein:  97-122 grams Fluid:  >/= 2 L/day      Trenton Gammon, MS, RD, LDN, CNSC Inpatient Clinical Dietitian RD pager # available in AMION  After hours/weekend pager # available in [facility]

## 2020-08-09 NOTE — Progress Notes (Addendum)
Cardiology Progress Note  Patient ID: Mitchell King MRN: [id number] DOB: Feb 13, 1951 Date of Encounter: 08/09/2020  Primary Cardiologist: No primary care provider on file.  Subjective   Chief Complaint: Confused  HPI: Became more encephalopathic yesterday, had a head CT which was normal.  ABG showed hypercapnia.  Started on BiPAP but PCO2 risk 70s to 90s.  He was transferred to ICU and intubated.  Sedated, not following commands.  ROS:  All other ROS reviewed and negative. Pertinent positives noted in the HPI.     Inpatient Medications  Scheduled Meds: . acetaZOLAMIDE  8 mg/kg Per Tube Daily  . arformoterol  15 mcg Nebulization BID  . budesonide (PULMICORT) nebulizer solution  0.5 mg Nebulization BID  . carvedilol  12.5 mg Per Tube BID WC  . chlorhexidine gluconate (MEDLINE KIT)  15 mL Mouth Rinse BID  . Chlorhexidine Gluconate Cloth  6 each Topical Daily  . docusate  100 mg Per Tube BID  . feeding supplement (PROSource TF)  45 mL Per Tube BID  . feeding supplement (VITAL HIGH PROTEIN)  1,000 mL Per Tube Q24H  . fentaNYL (SUBLIMAZE) injection  25 mcg Intravenous Once  . furosemide  80 mg Intravenous BID  . insulin aspart  2-6 Units Subcutaneous Q4H  . lactulose  30 g Per Tube BID  . losartan  100 mg Per Tube Daily  . mouth rinse  15 mL Mouth Rinse 10 times per day  . methylPREDNISolone (SOLU-MEDROL) injection  40 mg Intravenous Q12H  . pantoprazole (PROTONIX) IV  40 mg Intravenous Daily  . polyethylene glycol  17 g Per Tube Daily  . revefenacin  175 mcg Nebulization Daily  . sodium chloride flush  3 mL Intravenous Q12H   Continuous Infusions: . sodium chloride    . fentaNYL infusion INTRAVENOUS 200 mcg/hr (08/09/20 0700)  . heparin 1,700 Units/hr (08/09/20 0740)   PRN Meds: sodium chloride, acetaminophen, fentaNYL, hydrALAZINE, ipratropium-albuterol, ondansetron (ZOFRAN) IV, sodium chloride flush   Vital Signs   Vitals:   08/09/20 0600 08/09/20 0700 08/09/20 0753  08/09/20 0754  BP: 138/75 116/70 131/70   Pulse: (!) 50 (!) 50 (!) 52   Resp: _0 Temp:    (!) 97.1 F (36.2 C)  TempSrc:    Axillary  SpO2: 99% 100% 99%   Weight:      Height:        Intake/Output Summary (Last 24 hours) at 08/09/2020 1021 Last data filed at 08/09/2020 1000 Gross per 24 hour  Intake 762.06 ml  Output 1850 ml  Net -1087.94 ml   Last 3 Weights 08/09/2020 08/08/2020 08/07/2020  Weight (lbs) 178 lb 12.7 oz 183 lb 6.8 oz 193 lb 11.2 oz  Weight (kg) 81.1 kg 83.2 kg 87.862 kg      Telemetry  sinus bradycardia heart rate in the 40s, which I personally reviewed.   Physical Exam   Vitals:   08/09/20 0600 08/09/20 0700 08/09/20 0753 08/09/20 0754  BP: 138/75 116/70 131/70   Pulse: (!) 50 (!) 50 (!) 52   Resp: _1 Temp:    (!) 97.1 F (36.2 C)  TempSrc:    Axillary  SpO2: 99% 100% 99%   Weight:      Height:         Intake/Output Summary (Last 24 hours) at 08/09/2020 1021 Last data filed at 08/09/2020 1000 Gross per 24 hour  Intake 762.06 ml  Output 1850 ml  Net -1087.94 ml  Last 3 Weights 08/09/2020 08/08/2020 08/07/2020  Weight (lbs) 178 lb 12.7 oz 183 lb 6.8 oz 193 lb 11.2 oz  Weight (kg) 81.1 kg 83.2 kg 87.862 kg    Body mass index is 24.25 kg/m.   General: Intubated, sedated, not following commands Neck: +JVD Endocrine: No thryomegaly Cardiac: Normal S1, S2; RRR; no murmurs, rubs, or gallops Lungs: Diffuse rhonchi Abd: Distended abdomen Ext: trace edema Musculoskeletal: No deformities Skin: Warm and dry, no rashes   Neuro: Not following commands  Labs  High Sensitivity Troponin:   Recent Labs  Lab 08/04/20 2003  TROPONINIHS 44*     Cardiac EnzymesNo results for input(s): TROPONINI in the last 168 hours. No results for input(s): TROPIPOC in the last 168 hours.  Chemistry Recent Labs  Lab 08/06/20 0353 08/07/20 0311 08/07/20 0951 08/08/20 0321 08/08/20 1610 08/08/20 1751  NA 137 136  --  137 138 137  K 4.7 4.5  --  4.0  4.1 3.8  CL 93* 94*  --  91*  --   --   CO2 34* 33*  --  39*  --   --   GLUCOSE 113* 137*  --  121*  --   --   BUN 23 30*  --  35*  --   --   CREATININE 0.94 1.04  --  1.20  --   --   CALCIUM 9.2 8.6*  --  9.0  --   --   PROT 7.8  --  7.1 7.4  --   --   ALBUMIN 3.1*  --  2.9* 3.1*  --   --   AST 30  --  29 28  --   --   ALT 15  --  15 16  --   --   ALKPHOS 66  --  60 62  --   --   BILITOT 2.1*  --  1.3* 1.9*  --   --   GFRNONAA >60 >60  --  >60  --   --   ANIONGAP 10 9  --  7  --   --     Hematology Recent Labs  Lab 08/07/20 0311 08/08/20 0321 08/08/20 1610 08/08/20 1751 08/09/20 0059  WBC 5.3 3.8*  --   --  3.6*  RBC 4.94 4.95  --   --  5.28  HGB 15.2 15.4 18.4* 17.7* 15.9  HCT 50.4 50.4 54.0* 52.0 52.4*  MCV 102.0* 101.8*  --   --  99.2  MCH 30.8 31.1  --   --  30.1  MCHC 30.2 30.6  --   --  30.3  RDW 14.0 14.1  --   --  13.9  PLT 190 205  --   --  189   BNP Recent Labs  Lab 08/04/20 2003 08/08/20 1452  BNP 690.9* 781.2*    DDimer  Recent Labs  Lab 08/05/20 2037  DDIMER 2.46*     Radiology  CT ABDOMEN PELVIS WO CONTRAST  Result Date: 08/09/2020 CLINICAL DATA:  Abdominal distension.  Acute abdominal pain EXAM: CT ABDOMEN AND PELVIS WITHOUT CONTRAST TECHNIQUE: Multidetector CT imaging of the abdomen and pelvis was performed following the standard protocol without IV contrast. COMPARISON:  None similar and available. Right upper quadrant ultrasound from 2 days ago. FINDINGS: Lower chest:  Lower lobe atelectasis with small pleural effusions. Hepatobiliary: No focal liver abnormality.Numerous calcified gallstones. No focal pericholecystic inflammation. Pancreas: Unremarkable. Spleen: Unremarkable. Adrenals/Urinary Tract: Negative adrenals. Mild bilateral hydroureteronephrosis above a distended  urinary bladder. Right renal cysts which have a simple appearance by CT, up to 5.7 cm from the right lower pole. Unremarkable bladder. Stomach/Bowel: No obstruction or ileus. No  visible bowel inflammation. There is an enteric tube with tip at the mid stomach. Vascular/Lymphatic: Extensive atheromatous calcification. No mass or adenopathy. Reproductive:No acute finding. Other: No ascites or pneumoperitoneum. Anasarca. Small umbilical hernia containing fat. Musculoskeletal: No acute abnormalities. Diffuse facet osteoarthritis. L4-5 anterolisthesis. IMPRESSION: 1. Distended urinary bladder reaching the umbilicus and causing bilateral hydroureteronephrosis. 2.  Aortic Atherosclerosis (ICD10-I70.0). 3. Anasarca. 4. Lower lobe atelectasis and small pleural effusions. 5. Cholelithiasis. Electronically Signed   By: Monte Fantasia M.D.   On: 08/09/2020 07:23   DG CHEST PORT 1 VIEW  Result Date: 08/08/2020 CLINICAL DATA:  Intubation EXAM: PORTABLE CHEST 1 VIEW COMPARISON:  Portable exam 1646 hours compared to 08/08/2020 FINDINGS: No endotracheal tube tip projecting 5.8 cm above carina. Nasogastric tube extends into stomach. Normal heart size and mediastinal contours. Atherosclerotic calcification aorta. Pulmonary vascular congestion. Pulmonary infiltrates are identified in the mid to lower lungs bilaterally, slightly increased since previous exam, question pulmonary edema versus multifocal infection. RIGHT pleural effusion. No pneumothorax. IMPRESSION: Slightly increased pulmonary infiltrates in mid to lower lungs greater on RIGHT which could represent edema or infection. RIGHT pleural effusion. Endotracheal nasogastric tube positions as above. Electronically Signed   By: Lavonia Dana M.D.   On: 08/08/2020 17:04   DG CHEST PORT 1 VIEW  Result Date: 08/08/2020 CLINICAL DATA:  Altered mental status. EXAM: PORTABLE CHEST 1 VIEW COMPARISON:  CT chest dated August 05, 2020. Chest x-ray dated August 04, 2020. FINDINGS: The left costophrenic angle is excluded from the field of view. Unchanged mild cardiomegaly and pulmonary vascular congestion. Decreased small left and unchanged small right  pleural effusions with unchanged mild right basilar atelectasis. Improving aeration at the left lung base. No pneumothorax. No acute osseous abnormality. IMPRESSION: 1. Decreased small left and unchanged small right pleural effusions with bibasilar atelectasis. Electronically Signed   By: Titus Dubin M.D.   On: 08/08/2020 10:25   US Abdomen Limited RUQ (LIVER/GB)  Result Date: 08/07/2020 CLINICAL DATA:  Elevated liver enzymes EXAM: ULTRASOUND ABDOMEN LIMITED RIGHT UPPER QUADRANT COMPARISON:  Abdominal ultrasound August 26, 2009 FINDINGS: Gallbladder: The gallbladder is contracted and filled with calculi. Largest individual gallstone measures approximately 1 cm in length. Gallbladder wall is thickened without edema or pericholecystic fluid. No sonographic Murphy sign noted by sonographer. Common bile duct: Diameter: 5 mm. No intrahepatic or extrahepatic biliary duct dilatation. Liver: No focal lesion identified. Liver echogenicity overall is increased. Portal vein is patent on color Doppler imaging with normal direction of blood flow towards the liver. Other: There is mild ascites.  Small right pleural effusion. IMPRESSION: 1. Gallbladder is contracted and filled with calculi. Gallbladder wall mildly thickened without gallbladder wall edema. No pericholecystic fluid. 2. Increased liver echogenicity, a finding likely indicative of hepatic steatosis, potentially with underlying parenchymal liver disease. No focal liver lesions are demonstrable on this study. 3.  Mild ascites. 4.  Small right pleural effusion. Electronically Signed   By: Lowella Grip III M.D.   On: 08/07/2020 16:03    Cardiac Studies  TTE 08/05/2020 1. Left ventricular ejection fraction, by estimation, is 55 to 60%. The  left ventricle has normal function. The left ventricle has no regional  wall motion abnormalities. There is mild concentric left ventricular  hypertrophy. Left ventricular diastolic  parameters were normal. There is  the interventricular septum is  flattened  in systole, consistent with right ventricular pressure overload.  2. Right ventricular systolic function is normal. The right ventricular  size is severely enlarged. Moderately increased right ventricular wall  thickness. There is mildly elevated pulmonary artery systolic pressure.  The estimated right ventricular  systolic pressure is 79.8 mmHg (suspect this is an underestimation due to  the faint TR jet).  3. Right atrial size was severely dilated.  4. The mitral valve is normal in structure. No evidence of mitral valve  regurgitation. No evidence of mitral stenosis.  5. The aortic valve is tricuspid. Aortic valve regurgitation is not  visualized. No aortic stenosis is present.  6. The inferior vena cava is dilated in size with <50% respiratory  variability, suggesting right atrial pressure of 15 mmHg.   CT PE Study  1. Several subsegmental pulmonary emboli in the pulmonary arteries supplying the bilateral upper lobes and the left lower lobe. 2. Moderate bilateral pleural effusions, right greater than left, with associated atelectasis. 3. Aspirated debris and bronchial wall thickening in the right lower lobe likely reflect chronic aspiration. 4. Enlarged pulmonary artery likely representing pulmonary hypertension. Mild cardiomegaly. Patient Profile  Mitchell King is a 70 y.o. male with history of hypertension, COPD, diabetes who was admitted on 08/04/2020 with congestive heart failure.  He is found to have severe RV enlargement with reduced function in the setting of several pulmonary emboli.  Course complicated on 03/11/1940 by altered mentation/encephalopathy.  Assessment & Plan   Right heart failure/cor pulmonale -Admitted with 2-week history of shortness of breath and lower extremity edema.  Found to have multiple subsegmental PEs in several lobes.  Echocardiogram with severely dilated RV and function appears reduced.  Symptoms are  more consistent with right heart failure. -He was hypoxic on admission which was related to his pulmonary emboli -He has been diuresed and is net -11.3 L.   -Worsening encephalopathy yesterday, found to have hypercarbic respiratory failure requiring intubation -Continue IV lasix 80 mg BID.  Will f/u on AM labs -Discontinue carvedilol, HR in 40-50s -Has been normotensive, warm on exam.  Will check lactate.  Would have low threshold for CVC or PICC to monitor CVP/Co-ox in setting of right heart failure if any signs of shock   Acute Encephalopathy: found to have hypercapnia, required intubation yesterday  PE: noted on CT 2/15.  R heart strain on echo.  Continue IV heparin   For questions or updates, please contact [place] Please consult [email or web address] for contact info under   CRITICAL CARE TIME: I have spent a total of 35 minutes with patient reviewing hospital notes, telemetry, EKGs, labs and examining the patient as well as establishing an assessment and plan that was discussed with the patient.  > 50% of time was spent in direct patient care. The patient is critically ill with multi-organ system failure and requires high complexity decision making for assessment and support, frequent evaluation and titration of therapies, application of advanced monitoring technologies and extensive interpretation of multiple databases.    Donato Heinz, MD

## 2020-08-09 NOTE — Progress Notes (Signed)
Pt transferred from [facility] 06 to CT and back to his room without any complications.

## 2020-08-10 ENCOUNTER — Inpatient Hospital Stay (HOSPITAL_COMMUNITY): Payer: Medicare Other

## 2020-08-10 DIAGNOSIS — E7220 Disorder of urea cycle metabolism, unspecified: Secondary | ICD-10-CM | POA: Diagnosis not present

## 2020-08-10 DIAGNOSIS — I5033 Acute on chronic diastolic (congestive) heart failure: Secondary | ICD-10-CM

## 2020-08-10 DIAGNOSIS — I2601 Septic pulmonary embolism with acute cor pulmonale: Secondary | ICD-10-CM | POA: Diagnosis not present

## 2020-08-10 DIAGNOSIS — J9601 Acute respiratory failure with hypoxia: Secondary | ICD-10-CM | POA: Diagnosis not present

## 2020-08-10 DIAGNOSIS — I50811 Acute right heart failure: Secondary | ICD-10-CM

## 2020-08-10 DIAGNOSIS — G9340 Encephalopathy, unspecified: Secondary | ICD-10-CM | POA: Diagnosis not present

## 2020-08-10 DIAGNOSIS — J9602 Acute respiratory failure with hypercapnia: Secondary | ICD-10-CM | POA: Diagnosis not present

## 2020-08-10 LAB — COMPREHENSIVE METABOLIC PANEL
ALT: 12 U/L (ref 0–44)
AST: 18 U/L (ref 15–41)
Albumin: 2.8 g/dL — ABNORMAL LOW (ref 3.5–5.0)
Alkaline Phosphatase: 50 U/L (ref 38–126)
Anion gap: 9 (ref 5–15)
BUN: 48 mg/dL — ABNORMAL HIGH (ref 8–23)
CO2: 31 mmol/L (ref 22–32)
Calcium: 8.5 mg/dL — ABNORMAL LOW (ref 8.9–10.3)
Chloride: 101 mmol/L (ref 98–111)
Creatinine, Ser: 1.18 mg/dL (ref 0.61–1.24)
GFR, Estimated: >60 mL/min (ref >60)
Glucose, Bld: 140 mg/dL — ABNORMAL HIGH (ref 70–99)
Potassium: 3.3 mmol/L — ABNORMAL LOW (ref 3.5–5.1)
Sodium: 141 mmol/L (ref 135–145)
Total Bilirubin: 2 mg/dL — ABNORMAL HIGH (ref 0.3–1.2)
Total Protein: 6.9 g/dL (ref 6.5–8.1)

## 2020-08-10 LAB — GLUCOSE, CAPILLARY
Glucose-Capillary: 112 mg/dL — ABNORMAL HIGH (ref 70–99)
Glucose-Capillary: 115 mg/dL — ABNORMAL HIGH (ref 70–99)
Glucose-Capillary: 132 mg/dL — ABNORMAL HIGH (ref 70–99)
Glucose-Capillary: 138 mg/dL — ABNORMAL HIGH (ref 70–99)
Glucose-Capillary: 150 mg/dL — ABNORMAL HIGH (ref 70–99)
Glucose-Capillary: 150 mg/dL — ABNORMAL HIGH (ref 70–99)
Glucose-Capillary: 157 mg/dL — ABNORMAL HIGH (ref 70–99)

## 2020-08-10 LAB — CBC
HCT: 50.2 % (ref 39.0–52.0)
Hemoglobin: 16.1 g/dL (ref 13.0–17.0)
MCH: 30.8 pg (ref 26.0–34.0)
MCHC: 32.1 g/dL (ref 30.0–36.0)
MCV: 96 fL (ref 80.0–100.0)
Platelets: 183 10*3/uL (ref 150–400)
RBC: 5.23 MIL/uL (ref 4.22–5.81)
RDW: 14.1 % (ref 11.5–15.5)
WBC: 5 10*3/uL (ref 4.0–10.5)
nRBC: 0 % (ref 0.0–0.2)

## 2020-08-10 LAB — HEPARIN LEVEL (UNFRACTIONATED): Heparin Unfractionated: 0.49 IU/mL (ref 0.30–0.70)

## 2020-08-10 LAB — PHOSPHORUS
Phosphorus: 4.3 mg/dL (ref 2.5–4.6)
Phosphorus: 5 mg/dL — ABNORMAL HIGH (ref 2.5–4.6)

## 2020-08-10 LAB — MAGNESIUM
Magnesium: 2.3 mg/dL (ref 1.7–2.4)
Magnesium: 2.6 mg/dL — ABNORMAL HIGH (ref 1.7–2.4)

## 2020-08-10 MED ORDER — PANTOPRAZOLE SODIUM 40 MG PO PACK: 40.0000 mg | PACK | Freq: Every day | ORAL | Status: DC

## 2020-08-10 MED ORDER — FENTANYL CITRATE (PF) 100 MCG/2ML IJ SOLN: 25.0000 ug | INTRAMUSCULAR | Status: DC | PRN

## 2020-08-10 MED ORDER — PROSOURCE PLUS PO LIQD
30.0000 mL | Freq: Three times a day (TID) | ORAL | Status: DC
  Administered 2020-08-10 – 2020-08-14 (×10): 30 mL via ORAL
  Filled 2020-08-10 (×11): qty 30

## 2020-08-10 MED ORDER — FENTANYL CITRATE (PF) 100 MCG/2ML IJ SOLN
25.0000 ug | INTRAMUSCULAR | Status: DC | PRN
  Administered 2020-08-11: 50 ug via INTRAVENOUS
  Filled 2020-08-10: qty 2

## 2020-08-10 MED ORDER — DOCUSATE SODIUM 50 MG/5ML PO LIQD
100.0000 mg | Freq: Two times a day (BID) | ORAL | Status: DC
  Filled 2020-08-10 (×3): qty 10

## 2020-08-10 MED ORDER — BETHANECHOL CHLORIDE 10 MG PO TABS
10.0000 mg | ORAL_TABLET | Freq: Three times a day (TID) | ORAL | Status: DC
  Administered 2020-08-10: 10 mg
  Filled 2020-08-10: qty 1

## 2020-08-10 MED ORDER — ACETAZOLAMIDE 250 MG PO TABS
8.0000 mg/kg | ORAL_TABLET | Freq: Every day | ORAL | Status: DC
  Administered 2020-08-11 – 2020-08-14 (×4): 625 mg via ORAL
  Filled 2020-08-10 (×6): qty 3

## 2020-08-10 MED ORDER — CHLORHEXIDINE GLUCONATE 0.12 % MT SOLN
OROMUCOSAL | Status: AC
  Administered 2020-08-10: 15 mL via OROMUCOSAL
  Filled 2020-08-10: qty 15

## 2020-08-10 MED ORDER — BISACODYL 10 MG RE SUPP
10.0000 mg | Freq: Every day | RECTAL | Status: DC | PRN
  Filled 2020-08-10: qty 1

## 2020-08-10 MED ORDER — PREDNISONE 20 MG PO TABS
40.0000 mg | ORAL_TABLET | Freq: Every day | ORAL | Status: DC
  Administered 2020-08-10: 40 mg
  Filled 2020-08-10: qty 2

## 2020-08-10 MED ORDER — LACTULOSE 10 GM/15ML PO SOLN
30.0000 g | Freq: Two times a day (BID) | ORAL | Status: DC
  Administered 2020-08-11 (×2): 30 g via ORAL
  Filled 2020-08-10 (×3): qty 45

## 2020-08-10 MED ORDER — PANTOPRAZOLE SODIUM 40 MG PO PACK
40.0000 mg | PACK | Freq: Every day | ORAL | Status: DC
  Administered 2020-08-10 – 2020-08-11 (×2): 40 mg via ORAL
  Filled 2020-08-10 (×2): qty 20

## 2020-08-10 MED ORDER — POTASSIUM CHLORIDE 20 MEQ PO PACK
40.0000 meq | PACK | Freq: Once | ORAL | Status: AC
  Administered 2020-08-10: 40 meq
  Filled 2020-08-10: qty 2

## 2020-08-10 MED ORDER — BETHANECHOL CHLORIDE 10 MG PO TABS
10.0000 mg | ORAL_TABLET | Freq: Three times a day (TID) | ORAL | Status: DC
  Administered 2020-08-11 (×2): 10 mg via ORAL
  Filled 2020-08-10 (×4): qty 1

## 2020-08-10 MED ORDER — PREDNISONE 20 MG PO TABS
40.0000 mg | ORAL_TABLET | Freq: Every day | ORAL | Status: DC
  Administered 2020-08-11: 40 mg via ORAL
  Filled 2020-08-10: qty 2

## 2020-08-10 MED ORDER — LOSARTAN POTASSIUM 50 MG PO TABS
100.0000 mg | ORAL_TABLET | Freq: Every day | ORAL | Status: DC
  Administered 2020-08-11 – 2020-08-16 (×6): 100 mg via ORAL
  Filled 2020-08-10 (×6): qty 2

## 2020-08-10 NOTE — Progress Notes (Addendum)
Cardiology Progress Note  Patient ID: Mitchell King MRN: [id number] DOB: 1951/04/05 Date of Encounter: 08/10/2020  Primary Cardiologist: No primary care provider on file.  Subjective   Excellent diuresis yesterday, net negative 4L.  Cr stable at 1.2.  BP 112/60, P 54.  Intubated, following commands   Inpatient Medications  Scheduled Meds: . acetaZOLAMIDE  8 mg/kg Per Tube Daily  . arformoterol  15 mcg Nebulization BID  . budesonide (PULMICORT) nebulizer solution  0.5 mg Nebulization BID  . chlorhexidine gluconate (MEDLINE KIT)  15 mL Mouth Rinse BID  . Chlorhexidine Gluconate Cloth  6 each Topical Daily  . docusate  100 mg Per Tube BID  . feeding supplement (PROSource TF)  45 mL Per Tube TID  . fentaNYL (SUBLIMAZE) injection  25 mcg Intravenous Once  . free water  100 mL Per Tube Q6H  . furosemide  80 mg Intravenous BID  . insulin aspart  2-6 Units Subcutaneous Q4H  . lactulose  30 g Per Tube BID  . losartan  100 mg Per Tube Daily  . mouth rinse  15 mL Mouth Rinse 10 times per day  . methylPREDNISolone (SOLU-MEDROL) injection  40 mg Intravenous Q12H  . pantoprazole (PROTONIX) IV  40 mg Intravenous Daily  . polyethylene glycol  17 g Per Tube Daily  . revefenacin  175 mcg Nebulization Daily  . sodium chloride flush  3 mL Intravenous Q12H   Continuous Infusions: . sodium chloride    . feeding supplement (OSMOLITE 1.2 CAL) 60 mL/hr at 08/10/20 0700  . fentaNYL infusion INTRAVENOUS 200 mcg/hr (08/10/20 0700)  . heparin 1,700 Units/hr (08/10/20 0700)   PRN Meds: sodium chloride, acetaminophen, fentaNYL, hydrALAZINE, ipratropium-albuterol, ondansetron (ZOFRAN) IV, sodium chloride flush   Vital Signs         Vitals:   08/10/20 0600 08/10/20 0700 08/10/20 0757 08/10/20 0809  BP: 114/69 115/65 112/60   Pulse: (!) 51 (!) 49 (!) 54   Resp: 20 20 20    Temp:    98.9 F (37.2 C)  TempSrc:      SpO2: 97% 97% 95%   Weight:      Height:         Intake/Output Summary (Last 24 hours) at 08/10/2020 0916 Last data filed at 08/10/2020 0700    Gross per 24 hour  Intake 1879.13 ml  Output 5890 ml  Net -4010.87 ml   Last 3 Weights 08/10/2020 08/09/2020 08/08/2020  Weight (lbs) 179 lb 3.7 oz 178 lb 12.7 oz 183 lb 6.8 oz  Weight (kg) 81.3 kg 81.1 kg 83.2 kg      Telemetry  sinus bradycardia heart rate in the 50s, PVCs which I personally reviewed.   Physical Exam         Vitals:   08/10/20 0600 08/10/20 0700 08/10/20 0757 08/10/20 0809  BP: 114/69 115/65 112/60   Pulse: (!) 51 (!) 49 (!) 54   Resp: 20 20 20    Temp:    98.9 F (37.2 C)  TempSrc:      SpO2: 97% 97% 95%   Weight:      Height:         Intake/Output Summary (Last 24 hours) at 08/10/2020 0916 Last data filed at 08/10/2020 0700    Gross per 24 hour  Intake 1879.13 ml  Output 5890 ml  Net -4010.87 ml    Last 3 Weights 08/10/2020 08/09/2020 08/08/2020  Weight (lbs) 179 lb 3.7 oz 178 lb 12.7 oz 183 lb 6.8 oz  Weight (kg) 81.3 kg 81.1 kg 83.2 kg    Body mass index is 24.31 kg/m.   General: Intubated, following commands Neck: +JVD Endocrine: No thryomegaly Cardiac: Normal S1, S2; RRR; no murmurs, rubs, or gallops Lungs: CTAB in anterior fields Abd: Soft Ext: No  edema Musculoskeletal: No deformities Skin: Warm and dry, no rashes   Neuro: following commands  Labs  High Sensitivity Troponin:   Last Labs      Recent Labs  Lab 08/04/20 2003  TROPONINIHS 44*       Cardiac Enzymes Last Labs   No results for input(s): TROPONINI in the last 168 hours.    Last Labs   No results for input(s): TROPIPOC in the last 168 hours.    Chemistry Last Labs          Recent Labs  Lab 08/08/20 0321 08/08/20 1610 08/08/20 1751 08/09/20 1043 08/10/20 0546  NA 137   < > 137 140 141  K 4.0   < > 3.8 3.6 3.3*  CL 91*  --   --  96* 101  CO2 39*  --   --  33* 31  GLUCOSE 121*  --   --  80 140*  BUN 35*  --   --  43* 48*   CREATININE 1.20  --   --  1.24 1.18  CALCIUM 9.0  --   --  9.1 8.5*  PROT 7.4  --   --  7.4 6.9  ALBUMIN 3.1*  --   --  3.0* 2.8*  AST 28  --   --  26 18  ALT 16  --   --  14 12  ALKPHOS 62  --   --  57 50  BILITOT 1.9*  --   --  2.5* 2.0*  GFRNONAA >60  --   --  >60 >60  ANIONGAP 7  --   --  11 9   < > = values in this interval not displayed.      Hematology Last Labs          Recent Labs  Lab 08/08/20 0321 08/08/20 1610 08/08/20 1751 08/09/20 0059 08/10/20 0546  WBC 3.8*  --   --  3.6* 5.0  RBC 4.95  --   --  5.28 5.23  HGB 15.4   < > 17.7* 15.9 16.1  HCT 50.4   < > 52.0 52.4* 50.2  MCV 101.8*  --   --  99.2 96.0  MCH 31.1  --   --  30.1 30.8  MCHC 30.6  --   --  30.3 32.1  RDW 14.1  --   --  13.9 14.1  PLT 205  --   --  189 183   < > = values in this interval not displayed.     BNP Last Labs       Recent Labs  Lab 08/04/20 2003 08/08/20 1452  BNP 690.9* 781.2*      DDimer  Last Labs      Recent Labs  Lab 08/05/20 2037  DDIMER 2.46*       Radiology   Imaging Results (Last 48 hours)  CT ABDOMEN PELVIS WO CONTRAST  Result Date: 08/09/2020 CLINICAL DATA:  Abdominal distension.  Acute abdominal pain EXAM: CT ABDOMEN AND PELVIS WITHOUT CONTRAST TECHNIQUE: Multidetector CT imaging of the abdomen and pelvis was performed following the standard protocol without IV contrast. COMPARISON:  None similar and available. Right upper quadrant ultrasound from 2 days ago.  FINDINGS: Lower chest:  Lower lobe atelectasis with small pleural effusions. Hepatobiliary: No focal liver abnormality.Numerous calcified gallstones. No focal pericholecystic inflammation. Pancreas: Unremarkable. Spleen: Unremarkable. Adrenals/Urinary Tract: Negative adrenals. Mild bilateral hydroureteronephrosis above a distended urinary bladder. Right renal cysts which have a simple appearance by CT, up to 5.7 cm from the right lower pole. Unremarkable bladder. Stomach/Bowel: No obstruction or  ileus. No visible bowel inflammation. There is an enteric tube with tip at the mid stomach. Vascular/Lymphatic: Extensive atheromatous calcification. No mass or adenopathy. Reproductive:No acute finding. Other: No ascites or pneumoperitoneum. Anasarca. Small umbilical hernia containing fat. Musculoskeletal: No acute abnormalities. Diffuse facet osteoarthritis. L4-5 anterolisthesis. IMPRESSION: 1. Distended urinary bladder reaching the umbilicus and causing bilateral hydroureteronephrosis. 2.  Aortic Atherosclerosis (ICD10-I70.0). 3. Anasarca. 4. Lower lobe atelectasis and small pleural effusions. 5. Cholelithiasis. Electronically Signed   By: Monte Fantasia M.D.   On: 08/09/2020 07:23   DG Chest Port 1 View  Result Date: 08/10/2020 CLINICAL DATA:  Acute respiratory failure. EXAM: PORTABLE CHEST 1 VIEW COMPARISON:  08/08/2020 FINDINGS: There is an NG tube with tip and side port below GE junction. Aortic atherosclerosis. Stable cardiomediastinal contours. Persistent diffuse increased interstitial opacities in both lungs. IMPRESSION: 1. Unchanged diffuse increase interstitial opacities within both lungs Electronically Signed   By: Kerby Moors M.D.   On: 08/10/2020 07:37   DG CHEST PORT 1 VIEW  Result Date: 08/08/2020 CLINICAL DATA:  Intubation EXAM: PORTABLE CHEST 1 VIEW COMPARISON:  Portable exam 1646 hours compared to 08/08/2020 FINDINGS: No endotracheal tube tip projecting 5.8 cm above carina. Nasogastric tube extends into stomach. Normal heart size and mediastinal contours. Atherosclerotic calcification aorta. Pulmonary vascular congestion. Pulmonary infiltrates are identified in the mid to lower lungs bilaterally, slightly increased since previous exam, question pulmonary edema versus multifocal infection. RIGHT pleural effusion. No pneumothorax. IMPRESSION: Slightly increased pulmonary infiltrates in mid to lower lungs greater on RIGHT which could represent edema or infection. RIGHT pleural  effusion. Endotracheal nasogastric tube positions as above. Electronically Signed   By: Lavonia Dana M.D.   On: 08/08/2020 17:04   DG CHEST PORT 1 VIEW  Result Date: 08/08/2020 CLINICAL DATA:  Altered mental status. EXAM: PORTABLE CHEST 1 VIEW COMPARISON:  CT chest dated August 05, 2020. Chest x-ray dated August 04, 2020. FINDINGS: The left costophrenic angle is excluded from the field of view. Unchanged mild cardiomegaly and pulmonary vascular congestion. Decreased small left and unchanged small right pleural effusions with unchanged mild right basilar atelectasis. Improving aeration at the left lung base. No pneumothorax. No acute osseous abnormality. IMPRESSION: 1. Decreased small left and unchanged small right pleural effusions with bibasilar atelectasis. Electronically Signed   By: Titus Dubin M.D.   On: 08/08/2020 10:25     Cardiac Studies  TTE 08/05/2020 1. Left ventricular ejection fraction, by estimation, is 55 to 60%. The  left ventricle has normal function. The left ventricle has no regional  wall motion abnormalities. There is mild concentric left ventricular  hypertrophy. Left ventricular diastolic  parameters were normal. There is the interventricular septum is flattened  in systole, consistent with right ventricular pressure overload.  2. Right ventricular systolic function is normal. The right ventricular  size is severely enlarged. Moderately increased right ventricular wall  thickness. There is mildly elevated pulmonary artery systolic pressure.  The estimated right ventricular  systolic pressure is 57.0 mmHg (suspect this is an underestimation due to  the faint TR jet).  3. Right atrial size was severely dilated.  4. The  mitral valve is normal in structure. No evidence of mitral valve  regurgitation. No evidence of mitral stenosis.  5. The aortic valve is tricuspid. Aortic valve regurgitation is not  visualized. No aortic stenosis is present.  6. The  inferior vena cava is dilated in size with <50% respiratory  variability, suggesting right atrial pressure of 15 mmHg.   CT PE Study  1. Several subsegmental pulmonary emboli in the pulmonary arteries supplying the bilateral upper lobes and the left lower lobe. 2. Moderate bilateral pleural effusions, right greater than left, with associated atelectasis. 3. Aspirated debris and bronchial wall thickening in the right lower lobe likely reflect chronic aspiration. 4. Enlarged pulmonary artery likely representing pulmonary hypertension. Mild cardiomegaly. Patient Profile  Mitchell King is a 70 y.o. male with history of hypertension, COPD, diabetes who was admitted on 08/04/2020 with congestive heart failure.  He is found to have severe RV enlargement with reduced function in the setting of several pulmonary emboli.  Course complicated on 0/47/9987 by altered mentation/encephalopathy.  Assessment & Plan   Right heart failure/cor pulmonale: Admitted with 2-week history of shortness of breath and lower extremity edema.  Found to have multiple subsegmental PEs in several lobes.  Echocardiogram with severely dilated RV and function appears reduced.  Worsening encephalopathy 2/18, found to have hypercarbic respiratory failure requiring intubation.  No evidence of shock, has been normotensive, warm on exam, lactate normal -Continue IV lasix 80 mg BID.  Excellent diuresis yesterday, would continue -Discontinued carvedilol, HR in 40-50s  Acute Encephalopathy: found to have hypercapnia, required intubation 2/18.  Improved, following commands, possible extubation today  PE: noted on CT 2/15.  R heart strain on echo.  Continue IV heparin   For questions or updates, please contact [place] Please consult [email or web address] for contact info under      Donato Heinz, MD

## 2020-08-10 NOTE — Progress Notes (Signed)
ANTICOAGULATION CONSULT NOTE - Follow Up Consult  Pharmacy Consult for Heparin Indication: pulmonary embolus  No Known Allergies  Patient Measurements: Height: 6' (182.9 cm) Weight: 81.3 kg (179 lb 3.7 oz) IBW/kg (Calculated) : 77.6  Vital Signs: Temp: 97.5 F (36.4 C) (02/20 1153) Temp Source: Axillary (02/20 0406) BP: 150/81 (02/20 1138) Pulse Rate: 64 (02/20 1138)  Labs: Recent Labs    08/08/20 0321 08/08/20 1452 08/08/20 1610 08/08/20 1751 08/09/20 0059 08/09/20 1043 08/10/20 0546  HGB 15.4  --    < > 17.7* 15.9  --  16.1  HCT 50.4  --    < > 52.0 52.4*  --  50.2  PLT 205  --   --   --  189  --  183  LABPROT  --  14.0  --   --   --   --   --   INR  --  1.1  --   --   --   --   --   HEPARINUNFRC 0.54  --   --   --  0.47  --  0.49  CREATININE 1.20  --   --   --   --  1.24 1.18   < > = values in this interval not displayed.    Estimated Creatinine Clearance: 63.9 mL/min (by C-G formula based on SCr of 1.18 mg/dL).  Assessment: 70 year old male to begin heparin for several scattered subsegmetal PE's. Negative for DVT. No AC PTA.  Heparin level is therapeutic at 0.49, on 1700 units/hr. Hgb 16, plt 180s stable. No s/sx of bleeding or infusion issues.   Goal of Therapy:  Heparin level 0.3-0.7 units/ml Monitor platelets by anticoagulation protocol: Yes   Plan:  Continue heparin drip to 1700 units / hr Daily heparin level, CBC F/u plans for PO AC once extubated   Tama Headings, PharmD, BCPS PGY2 Cardiology Pharmacy Resident Phone: [phone] 08/10/2020  12:17 PM  Please check [email or web address] for unit-specific pharmacy phone numbers.

## 2020-08-10 NOTE — Procedures (Signed)
Extubation Procedure Note  Patient Details:   Name: Mitchell King DOB: [birth year] MRN: [id number]   Airway Documentation:    Vent end date: 08/10/20 Vent end time: 1134   Evaluation  O2 sats: stable throughout Complications: No apparent complications Patient did tolerate procedure well. Bilateral Breath Sounds: Diminished   Yes   Positive cuff leak noted.  Patient placed on [place] 4 L with humidity, no stridor noted.  RT attempted Incentive spirometer, but patient not closing his lips around the mouthpiece at the time.  Rayburn Felt 08/10/2020, 11:41 AM

## 2020-08-10 NOTE — Progress Notes (Signed)
SLP Cancellation Note  Patient Details Name: Mitchell King MRN: [id number] DOB: [birth year]   Cancelled treatment:       Reason Eval/Treat Not Completed: Medical issues which prohibited therapy. Pt now intubated. SLP to sign off for now, but please reorder when needed.    Mahala Menghini., M.A. CCC-SLP Acute Rehabilitation Services Pager [phone] Office [phone]  08/10/2020, 8:00 AM

## 2020-08-10 NOTE — Progress Notes (Signed)
eLink Physician-Brief Progress Note Patient Name: Mitchell King DOB: 1950/10/15 MRN: [id number]   Date of Service  08/10/2020  HPI/Events of Note  Constipation - Request for suppository to induce BM.  eICU Interventions  Plan: 1. Dulcolax Suppository 10 mg PR Q day PRN moderate constipation.      Intervention Category Major Interventions: Other:  Lenell Antu 08/10/2020, 9:32 PM

## 2020-08-10 NOTE — Progress Notes (Addendum)
NAME:  VERNARD GRAM, MRN:  [id number], DOB:  November 29, 1950, LOS: 6 ADMISSION DATE:  08/04/2020, CONSULTATION DATE:  2/18 REFERRING MD:  Dr. Jacqulyn Bath, CHIEF COMPLAINT: Hypercapnic respiratory failure  Brief History:  70 year old male initially presented with progressive shortness of breath, cough, and orthopnea.  Seen with worsening hypercapnic respiratory failure morning of 2/18 prompting PCCM consult   Past Medical History:  Previous alcohol abuse Type 2 diabetes COPD Hypertension Tobacco abuse  Significant Hospital Events:  Admitted 2/14 2/14 through 2/18: Treated by cardiology with aggressive diuresis for hypervolemia.  Developed worsening encephalopathy on 2/17 and slowly progressive respiratory acidosis transferred to the intensive care intubated after failing BiPAP 2/19: Had marked bladder distention required Foley catheter.  Continued IV diuresis added back lactulose, also added IV Solu-Medrol for bronchospasm 2/20: Breath sounds more clear, bronchospasm much improved.  -13 L.  Attempted weaning but triggered apnea alarm, likely due to oversedation, RASS goal changed to 0 Consults:  Cardiology  Procedures:    Significant Diagnostic Tests:  CTA chest 2/15 > several subsegmental pulmonary emboli in the pulmonary arteries, moderate bilateral pleural effusion right greater than left, aspirated debris and bronchial wall thickening of the right lower lobe, and enlarged pulmonary artery  Head CT 2/17pain > negative  Abdominal ultrasound 2/17 > multiple gallstones with mild thickening of gallbladder wall without edema, hepatic steatosis CT of abdomen 2/19 showed distended urinary bladder reaching the umbilicus causing bilateral hydronephrosis, aortic arterial sclerosis, anasarca, lower lobe atelectasis with small effusions and cholelithiasis Micro Data:  Covid 2/15 > negative  Antimicrobials:     Interim History / Subjective:   Little bit better today.  Failed weaning attempt  due to sedation Objective   Blood pressure 115/65, pulse (Abnormal) 49, temperature 98.9 F (37.2 C), resp. rate 20, height 6' (1.829 m), weight 81.3 kg, SpO2 97 %.    Vent Mode: PRVC FiO2 (%):  [40 %] 40 % Set Rate:  [20 bmp] 20 bmp Vt Set:  [26 mL-620 mL] 620 mL PEEP:  [5 cmH20] 5 cmH20 Plateau Pressure:  [15 cmH20-17 cmH20] 15 cmH20   Intake/Output Summary (Last 24 hours) at 08/10/2020 0908 Last data filed at 08/10/2020 0700 Gross per 24 hour  Intake 1879.13 ml  Output 5890 ml  Net -4010.87 ml   Filed Weights   08/08/20 0200 08/09/20 0437 08/10/20 0500  Weight: 83.2 kg 81.1 kg 81.3 kg    Examination: General: 70 year old black male currently sedated  HEENT normocephalic atraumatic orally intubated, does have JVD Pulmonary: Coarse bilateral breath sounds.  Triggered apnea alarm during SBT attempt due to oversedation Cardiac: Regular rate and rhythm with soft systolic murmur Abdomen: Distended with tympanic percussion but present bowel sounds GU: Clear yellow urine Via Foley catheter Neuro: Agitated when awake pulling at tubes Extremities: Bilateral lower extremity edema still present  Resolved Hospital Problem list     Assessment & Plan:  Acute decompensated right heart failure -Patient presented with progressive shortness of breath, anasarca, and orthopnea -Echo revealed severely dilated RV and RA consistent with right heart failure, preserved EF Plan Cont to push diuretics Tele Additional recs per advanced HF team    Acute Hypoxic and hypercapnic Respiratory Failure Secondary to decompensated right heart failure secondary, pulmonary emboli, volume overload, small to moderate right pleural effusion with atelectasis and  COPD exacerbation Active smoker  Portable chest x-ray personally reviewed:Rotated film, asymmetrical right greater than left airspace disease but aeration improving daily -He is too sedated for SBT on morning rounds -He  is -13.9 L Plan  continue  full ventilator support, have discontinued sedating drips we will reassess for spontaneous breathing trial later this morning PAD protocol, changing RASS goal to 0 VAP bundle Continue IV diuresis Continue scheduled bronchodilators Can change Solu-Medrol to prednisone A.m. chest x-ray   Several subsegmental PE -Seen on CTA 2/15, family reports decreased mobility prior to admission due to lower extremity edema, seems like these may be chronic Plan IV heparin  Acute metabolic encephalopathy -Secondary to significant hypercapnia with possible underlying hospital delirium as well -Head CT 2/16, liver enzymes, and ammonia level within normal limits Plan Continuing lactulose PAD protocol RASS goal 0-1 Limit sedation Supportive care  Hypokalemia Plan Replace and recheck   Hx of HTN  Plan Continue losartan, Coreg discontinued due to bradycardia  Urinary retention Plan Keep Foley catheter in place for now add Urecholine   HX of type 2 diabetes -Managed by diet PTA, Hgb A1C 6.2, currently normoglycemic Plan Sliding scale insulin Glucose goal 140-180, currently within goal  Elevated total bilirubin Cholelithiasis without acute Cholelithiasis LFTs WNL  Plan Intermittent LFTs Best practice (evaluated daily)  Diet: NPO--.tubefeeds 2/19 Pain/Anxiety/Delirium protocol (if indicated): fent gtt-->changed to PRN 2/20 VAP protocol (if indicated): In place  DVT prophylaxis: IV heparin GI prophylaxis: PPI Glucose control: SSi Mobility: Bedrest  Disposition:ICU  Goals of Care:  Last date of multidisciplinary goals of care discussion:Pending  Family and staff present: Pending  Summary of discussion: Pending  Follow up goals of care discussion due: Pending  Code Status: Full   Critical care time: 31 min   Performed by: Shelby Mattocks

## 2020-08-10 NOTE — Plan of Care (Signed)
  Problem: Activity: Goal: Capacity to carry out activities will improve Outcome: Progressing   Problem: Cardiac: Goal: Ability to achieve and maintain adequate cardiopulmonary perfusion will improve Outcome: Progressing   Problem: Clinical Measurements: Goal: Ability to maintain clinical measurements within normal limits will improve Outcome: Progressing Goal: Will remain free from infection Outcome: Progressing Goal: Diagnostic test results will improve Outcome: Progressing Goal: Respiratory complications will improve Outcome: Progressing Goal: Cardiovascular complication will be avoided Outcome: Progressing   Problem: Nutrition: Goal: Adequate nutrition will be maintained Outcome: Progressing   Problem: Pain Managment: Goal: General experience of comfort will improve Outcome: Progressing   Problem: Elimination: Goal: Will not experience complications related to bowel motility Outcome: Progressing Goal: Will not experience complications related to urinary retention Outcome: Progressing   Problem: Safety: Goal: Ability to remain free from injury will improve Outcome: Progressing   Problem: Skin Integrity: Goal: Risk for impaired skin integrity will decrease Outcome: Progressing

## 2020-08-11 ENCOUNTER — Inpatient Hospital Stay (HOSPITAL_COMMUNITY): Payer: Medicare Other

## 2020-08-11 DIAGNOSIS — I50811 Acute right heart failure: Secondary | ICD-10-CM | POA: Diagnosis not present

## 2020-08-11 DIAGNOSIS — J9601 Acute respiratory failure with hypoxia: Secondary | ICD-10-CM | POA: Diagnosis not present

## 2020-08-11 DIAGNOSIS — I5033 Acute on chronic diastolic (congestive) heart failure: Secondary | ICD-10-CM | POA: Diagnosis not present

## 2020-08-11 DIAGNOSIS — I2609 Other pulmonary embolism with acute cor pulmonale: Secondary | ICD-10-CM | POA: Diagnosis not present

## 2020-08-11 DIAGNOSIS — J9602 Acute respiratory failure with hypercapnia: Secondary | ICD-10-CM | POA: Diagnosis not present

## 2020-08-11 LAB — CBC
HCT: 57.1 % — ABNORMAL HIGH (ref 39.0–52.0)
Hemoglobin: 17.4 g/dL — ABNORMAL HIGH (ref 13.0–17.0)
MCH: 30.5 pg (ref 26.0–34.0)
MCHC: 30.5 g/dL (ref 30.0–36.0)
MCV: 100.2 fL — ABNORMAL HIGH (ref 80.0–100.0)
Platelets: 194 10*3/uL (ref 150–400)
RBC: 5.7 MIL/uL (ref 4.22–5.81)
RDW: 14.1 % (ref 11.5–15.5)
WBC: 7.3 10*3/uL (ref 4.0–10.5)
nRBC: 0 % (ref 0.0–0.2)

## 2020-08-11 LAB — GLUCOSE, CAPILLARY
Glucose-Capillary: 103 mg/dL — ABNORMAL HIGH (ref 70–99)
Glucose-Capillary: 106 mg/dL — ABNORMAL HIGH (ref 70–99)
Glucose-Capillary: 114 mg/dL — ABNORMAL HIGH (ref 70–99)
Glucose-Capillary: 115 mg/dL — ABNORMAL HIGH (ref 70–99)
Glucose-Capillary: 133 mg/dL — ABNORMAL HIGH (ref 70–99)
Glucose-Capillary: 141 mg/dL — ABNORMAL HIGH (ref 70–99)

## 2020-08-11 LAB — COMPREHENSIVE METABOLIC PANEL
ALT: 14 U/L (ref 0–44)
AST: 22 U/L (ref 15–41)
Albumin: 3.1 g/dL — ABNORMAL LOW (ref 3.5–5.0)
Alkaline Phosphatase: 57 U/L (ref 38–126)
Anion gap: 13 (ref 5–15)
BUN: 40 mg/dL — ABNORMAL HIGH (ref 8–23)
CO2: 35 mmol/L — ABNORMAL HIGH (ref 22–32)
Calcium: 9.8 mg/dL (ref 8.9–10.3)
Chloride: 96 mmol/L — ABNORMAL LOW (ref 98–111)
Creatinine, Ser: 1.2 mg/dL (ref 0.61–1.24)
GFR, Estimated: >60 mL/min (ref >60)
Glucose, Bld: 87 mg/dL (ref 70–99)
Potassium: 3.1 mmol/L — ABNORMAL LOW (ref 3.5–5.1)
Sodium: 144 mmol/L (ref 135–145)
Total Bilirubin: 1.8 mg/dL — ABNORMAL HIGH (ref 0.3–1.2)
Total Protein: 7.7 g/dL (ref 6.5–8.1)

## 2020-08-11 LAB — HEPARIN LEVEL (UNFRACTIONATED): Heparin Unfractionated: 0.62 IU/mL (ref 0.30–0.70)

## 2020-08-11 MED ORDER — POTASSIUM CHLORIDE 10 MEQ/100ML IV SOLN
10.0000 meq | INTRAVENOUS | Status: AC
  Administered 2020-08-11 (×5): 10 meq via INTRAVENOUS
  Filled 2020-08-11 (×5): qty 100

## 2020-08-11 MED ORDER — METOCLOPRAMIDE HCL 5 MG/ML IJ SOLN
10.0000 mg | Freq: Four times a day (QID) | INTRAMUSCULAR | Status: AC
  Administered 2020-08-11 – 2020-08-13 (×8): 10 mg via INTRAVENOUS
  Filled 2020-08-11 (×8): qty 2

## 2020-08-11 MED ORDER — METOCLOPRAMIDE HCL 5 MG/ML IJ SOLN
10.0000 mg | Freq: Four times a day (QID) | INTRAMUSCULAR | Status: DC | PRN
  Filled 2020-08-11: qty 2

## 2020-08-11 MED ORDER — MIDAZOLAM HCL 2 MG/2ML IJ SOLN
2.0000 mg | Freq: Once | INTRAMUSCULAR | Status: AC
  Administered 2020-08-11: 2 mg via INTRAVENOUS

## 2020-08-11 MED ORDER — POTASSIUM CHLORIDE 10 MEQ/100ML IV SOLN
10.0000 meq | Freq: Once | INTRAVENOUS | Status: AC
  Administered 2020-08-11: 10 meq via INTRAVENOUS
  Filled 2020-08-11: qty 100

## 2020-08-11 MED ORDER — KETAMINE HCL 50 MG/5ML IJ SOSY
1.0000 mg/kg | PREFILLED_SYRINGE | Freq: Once | INTRAMUSCULAR | Status: AC
  Administered 2020-08-11: 77 mg via INTRAVENOUS
  Filled 2020-08-11: qty 10

## 2020-08-11 MED ORDER — MIDAZOLAM HCL 2 MG/2ML IJ SOLN
INTRAMUSCULAR | Status: AC
  Filled 2020-08-11: qty 2

## 2020-08-11 NOTE — Progress Notes (Signed)
[facility] ADULT ICU REPLACEMENT PROTOCOL   The patient does apply for the [facility] Adult ICU Electrolyte Replacment Protocol based on the criteria listed below:   1. Is GFR >/= 30 ml/min? Yes.    Patient's GFR today is  2. Is SCr </= 2? Yes.   Patient's SCr is 1.20 ml/kg/hr 3. Did SCr increase >/= 0.5 in 24 hours? No. 4. Abnormal electrolyte(s): K+ 3.1 5. Ordered repletion with: protocol 6. If a panic level lab has been reported, has the CCM MD in charge been notified? Yes.  .   Physician:  Althia Forts, Lilia Argue 08/11/2020 5:58 AM

## 2020-08-11 NOTE — Progress Notes (Signed)
ANTICOAGULATION CONSULT NOTE - Follow Up Consult  Pharmacy Consult for Heparin Indication: pulmonary embolus  No Known Allergies  Patient Measurements: Height: 6' (182.9 cm) Weight: 76.7 kg (169 lb 1.5 oz) IBW/kg (Calculated) : 77.6  Vital Signs: Temp: 97.6 F (36.4 C) (02/21 0759) Temp Source: Oral (02/21 0759) BP: 129/102 (02/21 1400) Pulse Rate: 63 (02/21 1400)  Labs: Recent Labs    08/09/20 0059 08/09/20 1043 08/10/20 0546 08/11/20 0033  HGB 15.9  --  16.1 17.4*  HCT 52.4*  --  50.2 57.1*  PLT 189  --  183 194  HEPARINUNFRC 0.47  --  0.49 0.62  CREATININE  --  1.24 1.18 1.20    Estimated Creatinine Clearance: 62.1 mL/min (by C-G formula based on SCr of 1.2 mg/dL).  Assessment: 70 year old male to begin heparin for several scattered subsegmetal PE's. Negative for DVT. No AC PTA.  Heparin level is therapeutic at 0.62, on 1700 units/hr.  Goal of Therapy:  Heparin level 0.3-0.7 units/ml Monitor platelets by anticoagulation protocol: Yes   Plan:  Continue heparin drip to 1700 units / hr Daily heparin level, CBC  Harland German, PharmD Clinical Pharmacist **Pharmacist phone directory can now be found on [email or web address] ([email or web address]).  Listed under [facility].  Addendum -bloody urine noted  Plan -Decrease heparin to 1600 units/hr -Next heparin level and CBC in am  Harland German, PharmD Clinical Pharmacist **Pharmacist phone directory can now be found on [email or web address] ([email or web address]).  Listed under [facility].

## 2020-08-11 NOTE — Progress Notes (Signed)
Cardiology Progress Note  Patient ID: [name] MRN: [id number] DOB: 1951/04/10 Date of Encounter: 08/11/2020  Primary Cardiologist: No primary care provider on file.  Subjective   Extubated yesterday.  Somnolent this morning, but just received dose of versed.  Excellent diuresis yesterday, net negative 3.8L.  Cr stable at 1.2.  BP 131/72, P 62.     Inpatient Medications  Scheduled Meds: . (feeding supplement) PROSource Plus  30 mL Oral TID BM  . acetaZOLAMIDE  8 mg/kg Oral Daily  . arformoterol  15 mcg Nebulization BID  . bethanechol  10 mg Oral TID  . budesonide (PULMICORT) nebulizer solution  0.5 mg Nebulization BID  . chlorhexidine gluconate (MEDLINE KIT)  15 mL Mouth Rinse BID  . Chlorhexidine Gluconate Cloth  6 each Topical Daily  . docusate  100 mg Oral BID  . free water  100 mL Per Tube Q6H  . furosemide  80 mg Intravenous BID  . insulin aspart  2-6 Units Subcutaneous Q4H  . ketamine (KETALAR) injection 7m/mL (IV use)  1 mg/kg Intravenous Once  . lactulose  30 g Oral BID  . losartan  100 mg Oral Daily  . mouth rinse  15 mL Mouth Rinse 10 times per day  . midazolam      . pantoprazole sodium  40 mg Oral Q1200  . polyethylene glycol  17 g Per Tube Daily  . predniSONE  40 mg Oral Daily  . revefenacin  175 mcg Nebulization Daily  . sodium chloride flush  3 mL Intravenous Q12H   Continuous Infusions: . sodium chloride    . feeding supplement (OSMOLITE 1.2 CAL) 60 mL/hr at 08/10/20 0700  . heparin 1,700 Units/hr (08/11/20 1000)  . potassium chloride 100 mL/hr at 08/11/20 1000   PRN Meds: sodium chloride, acetaminophen, bisacodyl, fentaNYL, fentaNYL (SUBLIMAZE) injection, fentaNYL (SUBLIMAZE) injection, hydrALAZINE, ipratropium-albuterol, ondansetron (ZOFRAN) IV, sodium chloride flush   Vital Signs   Vitals:   08/11/20 0800 08/11/20 0846 08/11/20 0900 08/11/20 1000  BP: (!) 144/72  (!) 145/87 131/72  Pulse: (!) 57 (!) 58 (!) 57 62  Resp: (!) 21 20 18  (!) 22   Temp:      TempSrc:      SpO2: 97%  98% 91%  Weight:      Height:        Intake/Output Summary (Last 24 hours) at 08/11/2020 1056 Last data filed at 08/11/2020 1030 Gross per 24 hour  Intake 898.06 ml  Output 4035 ml  Net -3136.94 ml   Last 3 Weights 08/11/2020 08/10/2020 08/09/2020  Weight (lbs) 169 lb 1.5 oz 179 lb 3.7 oz 178 lb 12.7 oz  Weight (kg) 76.7 kg 81.3 kg 81.1 kg      Telemetry  sinus bradycardia heart rate in the 50s, PVCs which I personally reviewed.   Physical Exam   Vitals:   08/11/20 0800 08/11/20 0846 08/11/20 0900 08/11/20 1000  BP: (!) 144/72  (!) 145/87 131/72  Pulse: (!) 57 (!) 58 (!) 57 62  Resp: (!) 21 20 18  (!) 22  Temp:      TempSrc:      SpO2: 97%  98% 91%  Weight:      Height:         Intake/Output Summary (Last 24 hours) at 08/11/2020 1056 Last data filed at 08/11/2020 1030 Gross per 24 hour  Intake 898.06 ml  Output 4035 ml  Net -3136.94 ml    Last 3 Weights 08/11/2020 08/10/2020 08/09/2020  Weight (lbs)  169 lb 1.5 oz 179 lb 3.7 oz 178 lb 12.7 oz  Weight (kg) 76.7 kg 81.3 kg 81.1 kg    Body mass index is 22.93 kg/m.   General: Somnolent, not following commands but had just received versed Neck: +JVD Cardiac: Normal S1, S2; RRR; no murmurs, rubs, or gallops Lungs: CTAB in anterior fields Abd: Mild distention Ext: No  edema Musculoskeletal: No deformities Skin: Warm and dry, no rashes   Neuro: not following commands  Labs  High Sensitivity Troponin:   Recent Labs  Lab 08/04/20 2003  TROPONINIHS 44*     Cardiac EnzymesNo results for input(s): TROPONINI in the last 168 hours. No results for input(s): TROPIPOC in the last 168 hours.  Chemistry Recent Labs  Lab 08/09/20 1043 08/10/20 0546 08/11/20 0033  NA 140 141 144  K 3.6 3.3* 3.1*  CL 96* 101 96*  CO2 33* 31 35*  GLUCOSE 80 140* 87  BUN 43* 48* 40*  CREATININE 1.24 1.18 1.20  CALCIUM 9.1 8.5* 9.8  PROT 7.4 6.9 7.7  ALBUMIN 3.0* 2.8* 3.1*  AST 26 18 22   ALT 14 12  14   ALKPHOS 57 50 57  BILITOT 2.5* 2.0* 1.8*  GFRNONAA >60 >60 >60  ANIONGAP 11 9 13     Hematology Recent Labs  Lab 08/09/20 0059 08/10/20 0546 08/11/20 0033  WBC 3.6* 5.0 7.3  RBC 5.28 5.23 5.70  HGB 15.9 16.1 17.4*  HCT 52.4* 50.2 57.1*  MCV 99.2 96.0 100.2*  MCH 30.1 30.8 30.5  MCHC 30.3 32.1 30.5  RDW 13.9 14.1 14.1  PLT 189 183 194   BNP Recent Labs  Lab 08/04/20 2003 08/08/20 1452  BNP 690.9* 781.2*    DDimer  Recent Labs  Lab 08/05/20 2037  DDIMER 2.46*     Radiology  DG Abd 1 View  Result Date: 08/11/2020 CLINICAL DATA:  Abdominal distension, pain EXAM: ABDOMEN - 1 VIEW COMPARISON:  None. FINDINGS: Air-filled loops of mildly dilated small bowel measuring up to 4.2 cm. Air is present within nondistended colon. No large volume free air on this supine study. IMPRESSION: Mildly dilated loops of small bowel may reflect small bowel obstruction or ileus. Electronically Signed   By: Macy Mis M.D.   On: 08/11/2020 10:10   DG Chest Port 1 View  Result Date: 08/11/2020 CLINICAL DATA:  Pulmonary edema EXAM: PORTABLE CHEST 1 VIEW COMPARISON:  Yesterday FINDINGS: Esophageal extubation. Hazy bilateral pulmonary opacity from pleural effusions and atelectasis by most recent CTs. Cardiomegaly. No visible pneumothorax. IMPRESSION: Unchanged hazy chest opacification correlating with atelectasis and pleural fluid on recent CT. Electronically Signed   By: Monte Fantasia M.D.   On: 08/11/2020 06:13   DG Chest Port 1 View  Result Date: 08/10/2020 CLINICAL DATA:  Acute respiratory failure. EXAM: PORTABLE CHEST 1 VIEW COMPARISON:  08/08/2020 FINDINGS: There is an NG tube with tip and side port below GE junction. Aortic atherosclerosis. Stable cardiomediastinal contours. Persistent diffuse increased interstitial opacities in both lungs. IMPRESSION: 1. Unchanged diffuse increase interstitial opacities within both lungs Electronically Signed   By: Kerby Moors M.D.   On: 08/10/2020  07:37    Cardiac Studies  TTE 08/05/2020 1. Left ventricular ejection fraction, by estimation, is 55 to 60%. The  left ventricle has normal function. The left ventricle has no regional  wall motion abnormalities. There is mild concentric left ventricular  hypertrophy. Left ventricular diastolic  parameters were normal. There is the interventricular septum is flattened  in systole, consistent with  right ventricular pressure overload.  2. Right ventricular systolic function is normal. The right ventricular  size is severely enlarged. Moderately increased right ventricular wall  thickness. There is mildly elevated pulmonary artery systolic pressure.  The estimated right ventricular  systolic pressure is 28.7 mmHg (suspect this is an underestimation due to  the faint TR jet).  3. Right atrial size was severely dilated.  4. The mitral valve is normal in structure. No evidence of mitral valve  regurgitation. No evidence of mitral stenosis.  5. The aortic valve is tricuspid. Aortic valve regurgitation is not  visualized. No aortic stenosis is present.  6. The inferior vena cava is dilated in size with <50% respiratory  variability, suggesting right atrial pressure of 15 mmHg.   CT PE Study  1. Several subsegmental pulmonary emboli in the pulmonary arteries supplying the bilateral upper lobes and the left lower lobe. 2. Moderate bilateral pleural effusions, right greater than left, with associated atelectasis. 3. Aspirated debris and bronchial wall thickening in the right lower lobe likely reflect chronic aspiration. 4. Enlarged pulmonary artery likely representing pulmonary hypertension. Mild cardiomegaly. Patient Profile  Mitchell King is a 70 y.o. male with history of hypertension, COPD, diabetes who was admitted on 08/04/2020 with congestive heart failure.  He is found to have severe RV enlargement with reduced function in the setting of several pulmonary emboli.  Course  complicated on 6/81/1572 by altered mentation/encephalopathy.  Assessment & Plan   Right heart failure/cor pulmonale: Admitted with 2-week history of shortness of breath and lower extremity edema.  Found to have multiple subsegmental PEs in several lobes.  Echocardiogram with severely dilated RV and function appears reduced.  Worsening encephalopathy 2/18, found to have hypercarbic respiratory failure requiring intubation.  No evidence of shock, has been normotensive, warm on exam, lactate normal -Continue IV lasix 80 mg BID.  Excellent diuresis yesterday, would continue for today.  Suspect approaching euvolemia may be able to switch to PO torsemide tomorrow -Discontinued carvedilol due to bradycardia  Acute Encephalopathy: found to have hypercapnia, required intubation 2/18.  Improved, extubated 2/20  PE: noted on CT 2/15.  R heart strain on echo.  Continue IV heparin   For questions or updates, please contact [place] Please consult [email or web address] for contact info under      Donato Heinz, MD

## 2020-08-11 NOTE — Progress Notes (Addendum)
Attending Addendum  08/11/2020 I saw and evaluated the patient. Discussed with resident and agree with resident's findings and plan as documented in the resident's note.  I have seen and evaluated the patient for respiratory failure.  S:  Having N/V abd distension. Breathing okay Mentating okay  O: Blood pressure 131/72, pulse 62, temperature 97.6 F (36.4 C), temperature source Oral, resp. rate (!) 22, height 6' (1.829 m), weight 76.7 kg, SpO2 91 %.  No acute distress Trachea midline Lungs with crackles at bedside Abdomen protuberant with hypoactive BS Ext with minimal edema Chest wall collaterals noted  A:  - Acute on chronic cor pulmonale improving with diuretics - Acute on chronic hypercarbic and hypoxemic respiratory failure - Hypercarbic encephalopathy improving - PE which may have precipitated worsening R heart failure - Ileus  P:  - NG to LIS - Trial of reglan - Fine to stop steroids - Continue diuretics - Continue heparin gtt, transition to NoAC once we are sure needs no further procedures - PT/OT consults  - Stable for transfer to progressive, appreciate [facility] taking over care starting 08/12/20  08/11/2020 Myrla Halsted MD      NAME:  Mitchell King, MRN:  [id number], DOB:  Sep 12, 1950, LOS: 7 ADMISSION DATE:  08/04/2020, CONSULTATION DATE:  2/18 REFERRING MD:  Dr. Jacqulyn Bath, CHIEF COMPLAINT: Hypercapnic respiratory failure  Brief History:  70 year old male initially presented with progressive shortness of breath, cough, and orthopnea.  Seen with worsening hypercapnic respiratory failure morning of 2/18 prompting PCCM consult  Past Medical History:  Previous alcohol abuse Type 2 diabetes COPD Hypertension Tobacco abuse  Significant Hospital Events:  Admitted 2/14 2/14 through 2/18: Treated by cardiology with aggressive diuresis for hypervolemia.  Developed worsening encephalopathy on 2/17 and slowly progressive respiratory acidosis transferred to the intensive  care intubated after failing BiPAP 2/19: Had marked bladder distention required Foley catheter.  Continued IV diuresis added back lactulose, also added IV Solu-Medrol for bronchospasm 2/20: Breath sounds more clear, bronchospasm much improved.  -13 L.  Attempted weaning but triggered apnea alarm, likely due to oversedation, RASS goal changed to 0 Consults:  Cardiology  Procedures:    Significant Diagnostic Tests:  CTA chest 2/15 > several subsegmental pulmonary emboli in the pulmonary arteries, moderate bilateral pleural effusion right greater than left, aspirated debris and bronchial wall thickening of the right lower lobe, and enlarged pulmonary artery  Head CT 2/17pain > negative  Abdominal ultrasound 2/17 > multiple gallstones with mild thickening of gallbladder wall without edema, hepatic steatosis CT of abdomen 2/19 showed distended urinary bladder reaching the umbilicus causing bilateral hydronephrosis, aortic arterial sclerosis, anasarca, lower lobe atelectasis with small effusions and cholelithiasis Micro Data:  Covid 2/15 > negative  Antimicrobials:     Interim History / Subjective:   Patient is resting comfortably in bed following extubation.  He is currently on 6 L high flow nasal cannula..  He does endorse abdominal pain and distention at this time. Objective   Blood pressure 135/77, pulse (!) 57, temperature 97.8 F (36.6 C), temperature source Axillary, resp. rate 14, height 6' (1.829 m), weight 76.7 kg, SpO2 98 %.    Vent Mode: PSV;CPAP FiO2 (%):  [40 %] 40 % Set Rate:  [20 bmp] 20 bmp Vt Set:  [620 mL] 620 mL PEEP:  [5 cmH20] 5 cmH20 Pressure Support:  [5 cmH20] 5 cmH20 Plateau Pressure:  [15 cmH20] 15 cmH20   Intake/Output Summary (Last 24 hours) at 08/11/2020 0747 Last data filed at 08/11/2020 0700 Gross  per 24 hour  Intake 675.47 ml  Output 4460 ml  Net -3784.53 ml   Filed Weights   08/09/20 0437 08/10/20 0500 08/11/20 0700  Weight: 81.1 kg 81.3 kg 76.7  kg    Examination: General: Elderly male; no acute distress HEENT normocephalic atraumatic; EOMI, MMM Pulmonary: Diffuse inspiratory wheezing Cardiac: Regular rate and rhythm with soft systolic murmur Abdomen: Distended with tympanic percussion but present bowel sounds Neuro: Alert oriented x4; no focal deficits noted Extremities: Bilateral lower extremity trace edema at ankles  Resolved Hospital Problem list   Acute metabolic encephalopathy  Assessment & Plan:   Acute Hypoxic and hypercapnic Respiratory Failure Secondary to decompensated right heart failure secondary, pulmonary emboli, volume overload, small to moderate right pleural effusion with atelectasis and  COPD exacerbation Active smoker  Patient successfully extubated yesterday.  He is currently on 6 L and tolerating well.  He has been on IV diuretics with approximately 4 L urine output over past 24 hours.  Is -17 L since admission.  Chest x-ray this morning unchanged from prior. Plan Continue IV diuresis through today; consider switching to oral diuretic tomorrow Continue scheduled bronchodilators Continue steroid taper with prednisone  Abdominal distention 2/2 SBO: Patient reported abdominal pain and distention.  KUB with dilated loops concerning for SBO versus ileus. Plan: NG tube Keep n.p.o. for now  Several subsegmental PE -Seen on CTA 2/15, family reports decreased mobility prior to admission due to lower extremity edema, seems like these may be chronic Plan IV heparin   Hx of HTN  Plan Continue losartan, Coreg discontinued due to bradycardia  Urinary retention Plan Bethanechol added yesterday Continue foley Voiding trial in few days    HX of type 2 diabetes -Managed by diet PTA, Hgb A1C 6.2, currently normoglycemic Plan Sliding scale insulin Glucose goal 140-180, currently within goal  Elevated total bilirubin Cholelithiasis without acute Cholelithiasis LFTs WNL  Plan Intermittent LFTs Best  practice (evaluated daily)  Diet: NPO Pain/Anxiety/Delirium protocol (if indicated): versed, (ketamine for NG tube) VAP protocol (if indicated): N/A DVT prophylaxis: IV heparin GI prophylaxis: PPI Glucose control: SSi Mobility: Bedrest  Disposition: Floors  Goals of Care:  Last date of multidisciplinary goals of care discussion:Pending  Family and staff present: Pending  Summary of discussion: Pending  Follow up goals of care discussion due: Pending  Code Status: Full Family Communication: Patient's son, Maksym, Pfiffner, updated via telephone 2/21  Critical care time: 31 min   Eliezer Bottom, MD Internal Medicine, PGY-2 08/11/20 11:07 AM Pager # [phone]

## 2020-08-11 NOTE — Progress Notes (Signed)
ANTICOAGULATION CONSULT NOTE - Follow Up Consult  Pharmacy Consult for Heparin Indication: pulmonary embolus  No Known Allergies  Patient Measurements: Height: 6' (182.9 cm) Weight: 76.7 kg (169 lb 1.5 oz) IBW/kg (Calculated) : 77.6  Vital Signs: Temp: 97.8 F (36.6 C) (02/21 0420) Temp Source: Axillary (02/21 0420) BP: 135/77 (02/21 0700) Pulse Rate: 57 (02/21 0700)  Labs: Recent Labs    08/08/20 1452 08/08/20 1610 08/09/20 0059 08/09/20 1043 08/10/20 0546 08/11/20 0033  HGB  --    < > 15.9  --  16.1 17.4*  HCT  --    < > 52.4*  --  50.2 57.1*  PLT  --   --  189  --  183 194  LABPROT 14.0  --   --   --   --   --   INR 1.1  --   --   --   --   --   HEPARINUNFRC  --   --  0.47  --  0.49 0.62  CREATININE  --   --   --  1.24 1.18 1.20   < > = values in this interval not displayed.    Estimated Creatinine Clearance: 62.1 mL/min (by C-G formula based on SCr of 1.2 mg/dL).  Assessment: 70 year old male to begin heparin for several scattered subsegmetal PE's. Negative for DVT. No AC PTA.  Heparin level is therapeutic at 0.62, on 1700 units/hr.  Goal of Therapy:  Heparin level 0.3-0.7 units/ml Monitor platelets by anticoagulation protocol: Yes   Plan:  Continue heparin drip to 1700 units / hr Daily heparin level, CBC  Harland German, PharmD Clinical Pharmacist **Pharmacist phone directory can now be found on [email or web address] ([email or web address]).  Listed under [facility].   Marland Kitchen

## 2020-08-12 ENCOUNTER — Inpatient Hospital Stay (HOSPITAL_COMMUNITY): Payer: Medicare Other

## 2020-08-12 DIAGNOSIS — I50811 Acute right heart failure: Secondary | ICD-10-CM | POA: Diagnosis not present

## 2020-08-12 DIAGNOSIS — I5033 Acute on chronic diastolic (congestive) heart failure: Secondary | ICD-10-CM | POA: Diagnosis not present

## 2020-08-12 DIAGNOSIS — I509 Heart failure, unspecified: Secondary | ICD-10-CM | POA: Diagnosis not present

## 2020-08-12 DIAGNOSIS — R140 Abdominal distension (gaseous): Secondary | ICD-10-CM | POA: Diagnosis not present

## 2020-08-12 DIAGNOSIS — I2609 Other pulmonary embolism with acute cor pulmonale: Secondary | ICD-10-CM | POA: Diagnosis not present

## 2020-08-12 LAB — BASIC METABOLIC PANEL
Anion gap: 11 (ref 5–15)
BUN: 40 mg/dL — ABNORMAL HIGH (ref 8–23)
CO2: 35 mmol/L — ABNORMAL HIGH (ref 22–32)
Calcium: 9.8 mg/dL (ref 8.9–10.3)
Chloride: 98 mmol/L (ref 98–111)
Creatinine, Ser: 1.2 mg/dL (ref 0.61–1.24)
GFR, Estimated: >60 mL/min (ref >60)
Glucose, Bld: 100 mg/dL — ABNORMAL HIGH (ref 70–99)
Potassium: 3.5 mmol/L (ref 3.5–5.1)
Sodium: 144 mmol/L (ref 135–145)

## 2020-08-12 LAB — CBC
HCT: 56.6 % — ABNORMAL HIGH (ref 39.0–52.0)
Hemoglobin: 17.1 g/dL — ABNORMAL HIGH (ref 13.0–17.0)
MCH: 30.5 pg (ref 26.0–34.0)
MCHC: 30.2 g/dL (ref 30.0–36.0)
MCV: 100.9 fL — ABNORMAL HIGH (ref 80.0–100.0)
Platelets: 184 10*3/uL (ref 150–400)
RBC: 5.61 MIL/uL (ref 4.22–5.81)
RDW: 13.9 % (ref 11.5–15.5)
WBC: 6.7 10*3/uL (ref 4.0–10.5)
nRBC: 0 % (ref 0.0–0.2)

## 2020-08-12 LAB — GLUCOSE, CAPILLARY
Glucose-Capillary: 86 mg/dL (ref 70–99)
Glucose-Capillary: 103 mg/dL — ABNORMAL HIGH (ref 70–99)
Glucose-Capillary: 150 mg/dL — ABNORMAL HIGH (ref 70–99)
Glucose-Capillary: 104 mg/dL — ABNORMAL HIGH (ref 70–99)
Glucose-Capillary: 127 mg/dL — ABNORMAL HIGH (ref 70–99)

## 2020-08-12 LAB — HEPARIN LEVEL (UNFRACTIONATED): Heparin Unfractionated: 0.46 IU/mL (ref 0.30–0.70)

## 2020-08-12 LAB — HEMOGLOBIN AND HEMATOCRIT, BLOOD
HCT: 54.5 % — ABNORMAL HIGH (ref 39.0–52.0)
Hemoglobin: 17.4 g/dL — ABNORMAL HIGH (ref 13.0–17.0)

## 2020-08-12 LAB — AMMONIA: Ammonia: 30 umol/L (ref 9–35)

## 2020-08-12 MED ORDER — BOOST / RESOURCE BREEZE PO LIQD CUSTOM
1.0000 | Freq: Three times a day (TID) | ORAL | Status: DC
  Administered 2020-08-12 – 2020-08-14 (×6): 1 via ORAL

## 2020-08-12 MED ORDER — LACTULOSE 10 GM/15ML PO SOLN: 10.0000 g | Freq: Every day | ORAL | Status: DC | PRN

## 2020-08-12 MED ORDER — ADULT MULTIVITAMIN W/MINERALS CH
1.0000 | ORAL_TABLET | Freq: Every day | ORAL | Status: DC
  Administered 2020-08-12 – 2020-08-16 (×5): 1 via ORAL
  Filled 2020-08-12 (×5): qty 1

## 2020-08-12 MED ORDER — LACTULOSE 10 GM/15ML PO SOLN
20.0000 g | Freq: Two times a day (BID) | ORAL | Status: DC
  Administered 2020-08-12 – 2020-08-16 (×9): 20 g via ORAL
  Filled 2020-08-12 (×9): qty 30

## 2020-08-12 MED ORDER — PANTOPRAZOLE SODIUM 40 MG IV SOLR
40.0000 mg | Freq: Every day | INTRAVENOUS | Status: DC
  Administered 2020-08-12 – 2020-08-15 (×5): 40 mg via INTRAVENOUS
  Filled 2020-08-12 (×5): qty 40

## 2020-08-12 MED ORDER — APIXABAN 5 MG PO TABS
5.0000 mg | ORAL_TABLET | Freq: Two times a day (BID) | ORAL | Status: DC
  Administered 2020-08-12 – 2020-08-16 (×9): 5 mg via ORAL
  Filled 2020-08-12 (×10): qty 1

## 2020-08-12 NOTE — Progress Notes (Addendum)
ANTICOAGULATION CONSULT NOTE - Follow Up Consult  Pharmacy Consult for Heparin Indication: pulmonary embolus  No Known Allergies  Patient Measurements: Height: 6' (182.9 cm) Weight: 72.5 kg (159 lb 13.3 oz) IBW/kg (Calculated) : 77.6  Vital Signs: Temp: 99.2 F (37.3 C) (02/22 0741) Temp Source: Oral (02/22 0741) BP: 138/77 (02/22 0412) Pulse Rate: 78 (02/22 0741)  Labs: Recent Labs    08/09/20 1043 08/10/20 0546 08/10/20 0546 08/11/20 0033 08/12/20 0034  HGB  --  16.1   < > 17.4* 17.4*  HCT  --  50.2  --  57.1* 54.5*  PLT  --  183  --  194  --   HEPARINUNFRC  --  0.49  --  0.62 0.46  CREATININE 1.24 1.18  --  1.20  --    < > = values in this interval not displayed.    Estimated Creatinine Clearance: 58.7 mL/min (by C-G formula based on SCr of 1.2 mg/dL).  Assessment: 70 year old male to begin heparin for several scattered subsegmetal PE's. Negative for DVT. No AC PTA.  Heparin level has been therapeutic. However, he has had some blood tinged urine. He also pulled out NG tube with some bloody mucus. We will target for a lower goal for now.   Goal of Therapy:  Heparin level 0.3-0.5 Monitor platelets by anticoagulation protocol: Yes   Plan:  Continue heparin drip to 1600 units / hr Daily heparin level, CBC  Ulyses Southward, PharmD, BCIDP, AAHIVP, CPP Infectious Disease Pharmacist 08/12/2020 8:25 AM   Addendum:  Rip Harbour to transition to PO apixaban today per Dr Jomarie Longs. She is aware of the blood tinged urine. He has already received 7d of IV heparin so we will just continue with the maintenance apixaban.  Apixaban 5mg  PO BID Rx will follow peripherally  , PharmD, BCIDP, AAHIVP, CPP Infectious Disease Pharmacist 08/12/2020 8:40 AM

## 2020-08-12 NOTE — TOC Progression Note (Signed)
Transition of Care [facility]) - Progression Note    Patient Details  Name: Mitchell King MRN: [id number] Date of Birth: March 06, 1951  Transition of Care [facility]) CM/SW Contact  Robyn Galati Aris [place], [place] Phone Number: 08/12/2020, 3:33 PM  Clinical Narrative:     CSW attempted to speak with pt about SNF but pt is unable to stay awake during discussion. CSW called pt's son and provided bed offers. Son chooses [facility]. CSW contacted [facility]; Lowella Bandy is unsure if beds will be available at end of the week but will look into it. Pt is vaccinated.   Expected Discharge Plan: Skilled Nursing Facility Barriers to Discharge: Continued Medical Work up  Expected Discharge Plan and Services Expected Discharge Plan: Skilled Nursing Facility       Living arrangements for the past 2 months: Single Family Home                                       Social Determinants of Health (SDOH) Interventions    Readmission Risk Interventions No flowsheet data found.

## 2020-08-12 NOTE — TOC Benefit Eligibility Note (Signed)
Transition of Care [facility]) Benefit Eligibility Note    Patient Details  Name: Mitchell King MRN: [id number] Date of Birth: Mar 17, 1951   Medication/Dose: Everlene Balls  2.5 MG BID  and  ELIQUIS  5 MG BID        Prescription Coverage Preferred Pharmacy: [facility]  ,  [place]  and  [facility] TRANSITIONS OF CARE [facility]  Spoke with Person/Company/Phone Number:: KEY  @ [employer] # [phone]           Additional Notes: PTS INS POLICY ONLY COVER MEDICIAL , NO PHCY BENEFITS    Mardene Sayer Phone Number: 08/12/2020, 2:06 PM

## 2020-08-12 NOTE — Progress Notes (Signed)
Spoke with MD. Made aware of pink tinged urine and previously noted bloody mucous. See new orders.

## 2020-08-12 NOTE — Progress Notes (Addendum)
eLink Physician-Brief Progress Note Patient Name: Mitchell King DOB: [birth year] MRN: [id number]   Date of Service  08/12/2020  HPI/Events of Note  Nursing reports patient coughing up bloody sputum, NG drainage now dark red. Patient pulled out NGT. Nursing unable to replace NGT. Patient is on a Heparin IV infusion for PE. Last Hgb = 17.4.   eICU Interventions  Plan: 1. H/H STAT. 2. Protonix per tube changed to IV.  3. Hold other per tube medications. 4. NGT replacement by rounding team in AM.      Intervention Category Major Interventions: Other:  Lenell Antu 08/12/2020, 12:32 AM

## 2020-08-12 NOTE — Care Management Important Message (Signed)
Important Message  Patient Details  Name: Mitchell King MRN: [id number] Date of Birth: June 19, 1951   Medicare Important Message Given:  Yes     Renie Ora 08/12/2020, 8:40 AM

## 2020-08-12 NOTE — Progress Notes (Signed)
Physical Therapy Treatment Patient Details Name: Mitchell King MRN: [id number] DOB: Jul 21, 1950 Today's Date: 08/12/2020    History of Present Illness Mitchell King is a 70 y.o. male with PMH significant of diet-controlled diabetes, COPD, HTN, previous alcohol abuse, who has not seen doctors in years and has not been taking his medications for a while except for inhalers. Presents with progressive SOB, cough, PND and orthopnea as well as LE edema over the last 2 weeks. Found to have acute exacerbation of CHF secondary to HTN heart disease, acute PEs, acute respiratory failure and metabolic encephalopathy.    PT Comments    Pt with improved mentation; oriented to self/place and month, but not year. Able to follow one step commands. Requiring two person min-mod assist for stand pivot transfers using a walker. SpO2 87-93% on 2L O2, BP 122/67 post mobility. Pt demonstrating poor sitting/standing balance, weakness, balance deficits, and cognitive impairments. Continue to recommend SNF for ongoing Physical Therapy.      Follow Up Recommendations  SNF;Supervision for mobility/OOB     Equipment Recommendations  Other (comment) (defer)    Recommendations for Other Services       Precautions / Restrictions Precautions Precautions: Fall Restrictions Weight Bearing Restrictions: No    Mobility  Bed Mobility Overal bed mobility: Needs Assistance Bed Mobility: Supine to Sit     Supine to sit: Min assist     General bed mobility comments: Cues for initiation, minA to scoot left hip out to edge of bed    Transfers Overall transfer level: Needs assistance Equipment used: Rolling walker (2 wheeled) Transfers: Sit to/from [employer] Sit to Stand: Min assist;+2 physical assistance Stand pivot transfers: Mod assist;+2 physical assistance       General transfer comment: MinA + 2 to rise from edge of bed (pt pulling up on walker), modA + 2 to pivot towards right to  recliner. Max cues for stepping initiation, sequencing/direction. Pt with posterior lean  Ambulation/Gait                 Stairs             Wheelchair Mobility    Modified Rankin (Stroke Patients Only)       Balance Overall balance assessment: Needs assistance Sitting-balance support: Feet supported Sitting balance-Leahy Scale: Poor Sitting balance - Comments: reliant on BUE support, supervision for safety   Standing balance support: Bilateral upper extremity supported Standing balance-Leahy Scale: Poor Standing balance comment: reliant on BUE support                            Cognition Arousal/Alertness: Awake/alert Behavior During Therapy: Flat affect Overall Cognitive Status: Impaired/Different from baseline Area of Impairment: Orientation                 Orientation Level: Disoriented to;Time             General Comments: Pt oriented to place and month, not year stating year was 2024. Pleasant and following commands. At times unintelligble speech      Exercises      General Comments        Pertinent Vitals/Pain Pain Assessment: Faces Faces Pain Scale: No hurt    Home Living                      Prior Function            PT Goals (current  goals can now be found in the care plan section) Acute Rehab PT Goals Patient Stated Goal: unable to state Potential to Achieve Goals: Fair Progress towards PT goals: Progressing toward goals    Frequency    Min 2X/week      PT Plan Current plan remains appropriate    Co-evaluation              AM-PAC PT "6 Clicks" Mobility   Outcome Measure  Help needed turning from your back to your side while in a flat bed without using bedrails?: A Little Help needed moving from lying on your back to sitting on the side of a flat bed without using bedrails?: A Little Help needed moving to and from a bed to a chair (including a wheelchair)?: A Lot Help needed  standing up from a chair using your arms (e.g., wheelchair or bedside chair)?: A Little Help needed to walk in hospital room?: A Lot Help needed climbing 3-5 steps with a railing? : Total 6 Click Score: 14    End of Session Equipment Utilized During Treatment: Gait belt;Oxygen Activity Tolerance: Patient tolerated treatment well Patient left: in chair;with call bell/phone within reach;with chair alarm set Nurse Communication: Mobility status PT Visit Diagnosis: Muscle weakness (generalized) (M62.81);Unsteadiness on feet (R26.81);Difficulty in walking, not elsewhere classified (R26.2)     Time: 3532-9924 PT Time Calculation (min) (ACUTE ONLY): 19 min  Charges:  $Therapeutic Activity: 8-22 mins                     Lillia Pauls, PT, DPT Acute Rehabilitation Services Pager [phone] Office [phone]    Norval Morton 08/12/2020, 1:16 PM

## 2020-08-12 NOTE — Progress Notes (Signed)
Heart Failure Stewardship Pharmacist Progress Note   PCP: Patient, No Pcp Per PCP-Cardiologist: No primary care provider on file.    HPI:  70 yo M with PMH of T2DM, COPD, HTN, prior alcohol abuse, and medication noncompliance. He presented to the ED On 08/04/20 with progressive shortness of breath, DOE, PND, and orthopnea over the last two weeks. He was admitted for acute CHF. An ECHO was done on 08/05/20 and LVEF is 55-60%. Also found to have several scattered small PE's. He was transferred to the ICU for hypercapnic respiratory failure, was intubated, then extubated over the weekend after being aggressively diuresed. He has been transferred back to the floor.   Current HF Medications: Furosemide 80 mg IV BID Diamox 8 mg/kg PO daily Losartan 100 mg daily  Prior to admission HF Medications: None  Pertinent Lab Values: . Serum creatinine 1.20, BUN 40, Potassium 3.5, Sodium 144, BNP 690.9  Vital Signs: . Weight: 159 lbs (admission weight: 180 lbs) . Blood pressure: 120-140/70s . Heart rate: 70s   Medication Assistance / Insurance Benefits Check: Does the patient have prescription insurance?  Yes Type of insurance plan: [employer]  Outpatient Pharmacy:  Prior to admission outpatient pharmacy: [facility] Is the patient willing to use [facility] TOC pharmacy at discharge? Yes   Assessment: 1. Acute diastolic CHF (EF 65-46%). NYHA class III symptoms. Remains fluid overloaded on MD exam.  - Continue furosemide 80 mg IV BID and diamox 8 mg/kg daily - Off carvedilol with bradycardia while in the ICU - Continue losartan 100 mg daily. Could consider optimizing further to [facility] given newer indication for LVEF <57% when able - Consider starting spironolactone 25 mg daily after further diuresis - Consider starting Jardiance 10 mg daily prior to discharge (following IV diuresis). [place] Pepper is the SGLT2i approved on the [place] formulary   Plan: 1) Medication changes recommended at this time: -  Continue current regimen; diuresis per cardiology  2) Patient assistance: - [facility] would benefit [facility] for [facility] medications as they would be free for him (Eliquis is on the [place] formulary for treatment of VTE)  3)  Education  - To be completed prior to discharge  Sharen Hones, PharmD, BCPS Heart Failure Stewardship Pharmacist Phone [phone]

## 2020-08-12 NOTE — Progress Notes (Addendum)
PROGRESS NOTE    Mitchell King  MRN:[id number] DOB: 04/10/1951 DOA: 08/04/2020 PCP: Patient, No Pcp Per  Brief Narrative: OPD, ongoing tobacco abuse, prior alcoholism, type 2 diabetes mellitus was admitted with acute hypoxic respiratory failure, right heart failure/cor pulmonale and bilateral PE, treated with diuretics and anticoagulation, he developed progressive encephalopathy, was noted to have elevated ammonia levels and started on lactulose, on 2/18 he developed worsening encephalopathy secondary to hypercarbia and hyperammonemia was intubated and transferred to the ICU, hospitalization was complicated by ileus as well requiring NG decompression. -Mental status and ileus improving, extubated on 2/20 -Transferred to TRH service today 2/22   Assessment & Plan:  Acute on chronic hypoxic and hypercarbic respiratory failure Acute right heart failure/cor pulmonale -Secondary to CHF, bilateral PE, underlying COPD -Intubated for worsening encephalopathy on 2/18 and extubated on 2/20 -Echo with preserved EF, RV is significantly enlarged -Cardiology following, continue IV Lasix -Ambulate, PT eval -Wean O2 as tolerated  Acute bilateral pulmonary embolism -Has been on IV heparin for several days will transition to Eliquis today  Encephalopathy -This is multifactorial, secondary to hypercarbia and elevated ammonia, could have chronic liver disease from alcoholism as well as right heart failure -Continue lactulose  Ileus -Appears to be improving, had a bowel movement overnight, NG tube pulled out by patient early this morning -Trial of clears, continue lactulose  Chronic liver disease -Likely secondary to prior alcoholism and right heart failure -Ultrasound noted abnormal echogenicity concerning for parenchymal liver disease -Bilirubin is mildly elevated but stable  Urinary retention -Voiding trial in a few days once more ambulatory, continue Foley for now  Type 2 diabetes  mellitus -CBGs are stable, A1c was 6.2, continue sliding scale insulin  COPD Heavy tobacco abuse -Counseled, nebs PRN  DVT prophylaxis: Eliquis Code Status: Full code Family Communication: No family at bedside, unable to reach patient's son will attempt again later Disposition Plan:  Status is: Inpatient  Remains inpatient appropriate because:Inpatient level of care appropriate due to severity of illness   Dispo: The patient is from: Home              Anticipated d/c is to: SNF              Anticipated d/c date is: 3 days              Patient currently is not medically stable to d/c.   Difficult to place patient No  Consultants:   Cardiology  PCCM transfer   Procedures:   Antimicrobials:    Subjective: -Pulled out NG tube early this morning, has mild confusion, reports that his breathing is improving, anxious to eat  Objective: Vitals:   08/12/20 0412 08/12/20 0417 08/12/20 0741 08/12/20 0749  BP: 138/77     Pulse:   78   Resp: 19  (!) 22   Temp: 99.1 F (37.3 C)  99.2 F (37.3 C)   TempSrc: Oral  Oral   SpO2: 97%   96%  Weight:  72.5 kg    Height:        Intake/Output Summary (Last 24 hours) at 08/12/2020 1133 Last data filed at 08/12/2020 0754 Gross per 24 hour  Intake 751.75 ml  Output 3650 ml  Net -2898.25 ml   Filed Weights   08/10/20 0500 08/11/20 0700 08/12/20 0417  Weight: 81.3 kg 76.7 kg 72.5 kg    Examination:  General exam: Chronically ill male appears older than stated age, somnolent but easily arousable, oriented to self and partly   to place, mild confusion noted CVS: S1-S2, regular rate and rhythm Lungs: Few basilar rales, poor air movement bilaterally Abdomen: Soft, mildly distended, nontender, bowel sounds present Extremities: No edema Neuro: Awake but somnolent, oriented to self and place only, moves all extremities, no localizing signs, positive asterixis  Psychiatry: Poor insight and judgment  Data Reviewed:   CBC: Recent  Labs  Lab 08/08/20 0321 08/08/20 1610 08/08/20 1751 08/09/20 0059 08/10/20 0546 08/11/20 0033 08/12/20 0034  WBC 3.8*  --   --  3.6* 5.0 7.3 6.7  HGB 15.4   < > 17.7* 15.9 16.1 17.4* 17.1*  17.4*  HCT 50.4   < > 52.0 52.4* 50.2 57.1* 56.6*  54.5*  MCV 101.8*  --   --  99.2 96.0 100.2* 100.9*  PLT 205  --   --  189 183 194 184   < > = values in this interval not displayed.   Basic Metabolic Panel: Recent Labs  Lab 08/08/20 0321 08/08/20 1610 08/08/20 1751 08/09/20 1043 08/09/20 1835 08/10/20 0546 08/10/20 1602 08/11/20 0033 08/12/20 0034  NA 137   < > 137 140  --  141  --  144 144  K 4.0   < > 3.8 3.6  --  3.3*  --  3.1* 3.5  CL 91*  --   --  96*  --  101  --  96* 98  CO2 39*  --   --  33*  --  31  --  35* 35*  GLUCOSE 121*  --   --  80  --  140*  --  87 100*  BUN 35*  --   --  43*  --  48*  --  40* 40*  CREATININE 1.20  --   --  1.24  --  1.18  --  1.20 1.20  CALCIUM 9.0  --   --  9.1  --  8.5*  --  9.8 9.8  MG 2.2  --   --  2.5* 2.2 2.3 2.6*  --   --   PHOS  --   --   --  2.0* 3.5 4.3 5.0*  --   --    < > = values in this interval not displayed.   GFR: Estimated Creatinine Clearance: 58.7 mL/min (by C-G formula based on SCr of 1.2 mg/dL). Liver Function Tests: Recent Labs  Lab 08/07/20 0951 08/08/20 0321 08/09/20 1043 08/10/20 0546 08/11/20 0033  AST _0 ALT _1 ALKPHOS 60 62 57 50 57  BILITOT 1.3* 1.9* 2.5* 2.0* 1.8*  PROT 7.1 7.4 7.4 6.9 7.7  ALBUMIN 2.9* 3.1* 3.0* 2.8* 3.1*   No results for input(s): LIPASE, AMYLASE in the last 168 hours. Recent Labs  Lab 08/07/20 0733 08/08/20 0321 08/12/20 0853  AMMONIA 75* 30 30   Coagulation Profile: Recent Labs  Lab 08/08/20 1452  INR 1.1   Cardiac Enzymes: No results for input(s): CKTOTAL, CKMB, CKMBINDEX, TROPONINI in the last 168 hours. BNP (last 3 results) No results for input(s): PROBNP in the last 8760 hours. HbA1C: No results for input(s): HGBA1C in the last 72  hours. CBG: Recent Labs  Lab 08/11/20 1602 08/11/20 2013 08/11/20 2350 08/12/20 0414 08/12/20 0824  GLUCAP 103* 114* 141* 86 103*   Lipid Profile: No results for input(s): CHOL, HDL, LDLCALC, TRIG, CHOLHDL, LDLDIRECT in the last 72 hours. Thyroid Function Tests: No results for input(s): TSH, T4TOTAL, FREET4, T3FREE, THYROIDAB in the  last 72 hours. Anemia Panel: No results for input(s): VITAMINB12, FOLATE, FERRITIN, TIBC, IRON, RETICCTPCT in the last 72 hours. Urine analysis:    Component Value Date/Time   COLORURINE YELLOW 08/07/2020 1101   APPEARANCEUR HAZY (A) 08/07/2020 1101   LABSPEC 1.011 08/07/2020 1101   PHURINE 5.0 08/07/2020 1101   GLUCOSEU NEGATIVE 08/07/2020 1101   HGBUR LARGE (A) 08/07/2020 1101   BILIRUBINUR NEGATIVE 08/07/2020 1101   BILIRUBINUR neg 04/20/2017 1447   KETONESUR NEGATIVE 08/07/2020 1101   PROTEINUR NEGATIVE 08/07/2020 1101   UROBILINOGEN 1.0 04/20/2017 1447   NITRITE NEGATIVE 08/07/2020 1101   LEUKOCYTESUR NEGATIVE 08/07/2020 1101   Sepsis Labs: @LABRCNTIP(procalcitonin:4,lacticidven:4)  ) Recent Results (from the past 240 hour(s))  SARS CORONAVIRUS 2 (TAT 6-24 HRS) Nasopharyngeal Nasopharyngeal Swab     Status: None   Collection Time: 08/05/20  1:03 AM   Specimen: Nasopharyngeal Swab  Result Value Ref Range Status   SARS Coronavirus 2 NEGATIVE NEGATIVE Final    Comment: (NOTE) SARS-CoV-2 target nucleic acids are NOT DETECTED.  The SARS-CoV-2 RNA is generally detectable in upper and lower respiratory specimens during the acute phase of infection. Negative results do not preclude SARS-CoV-2 infection, do not rule out co-infections with other pathogens, and should not be used as the sole basis for treatment or other patient management decisions. Negative results must be combined with clinical observations, patient history, and epidemiological information. The expected result is Negative.  Fact Sheet for  Patients: https://www.fda.gov/media/138098/download  Fact Sheet for Healthcare Providers: https://www.fda.gov/media/138095/download  This test is not yet approved or cleared by the [place] FDA and  has been authorized for detection and/or diagnosis of SARS-CoV-2 by FDA under an Emergency Use Authorization (EUA). This EUA will remain  in effect (meaning this test can be used) for the duration of the COVID-19 declaration under Se ction 564(b)(1) of the Act, 21 U.S.C. section 360bbb-3(b)(1), unless the authorization is terminated or revoked sooner.  Performed at [facility], [address] [place], [place] [address]   MRSA PCR Screening     Status: None   Collection Time: 08/08/20  6:39 PM   Specimen: Nasal Mucosa; Nasopharyngeal  Result Value Ref Range Status   MRSA by PCR NEGATIVE NEGATIVE Final    Comment:        The GeneXpert MRSA Assay (FDA approved for NASAL specimens only), is one component of a comprehensive MRSA colonization surveillance program. It is not intended to diagnose MRSA infection nor to guide or monitor treatment for MRSA infections. Performed at [facility], [address] [place], [place] [address]     Radiology Studies: DG Abd 1 View  Result Date: 08/12/2020 CLINICAL DATA:  Ileus. EXAM: ABDOMEN - 1 VIEW COMPARISON:  08/11/2020. FINDINGS: NG tube has been removed. Persistent but improved slightly dilated loops of small bowel noted. Colonic gas pattern is normal. No free air identified. Degenerative changes lumbar spine and both hips. Aortoiliac atherosclerotic vascular calcification. Pelvic calcifications consistent phleboliths. Left base atelectasis/infiltrate. IMPRESSION: 1. NG tube has been removed. Persistent but improved slightly dilated loops of small bowel noted. Colonic gas pattern is normal. 2. Left base atelectasis/infiltrate. Electronically Signed   By: Thomas  Register   On: 08/12/2020 06:38   DG Abd 1 View  Result Date:  08/11/2020 CLINICAL DATA:  Nasogastric tube placement. EXAM: ABDOMEN - 1 VIEW COMPARISON:  Same day. FINDINGS: Nasogastric tube tip is seen in proximal stomach. Stable mild small bowel dilatation is noted without colonic dilatation. IMPRESSION: Nasogastric   tube tip seen in proximal stomach. Stable mild small bowel dilatation. Electronically Signed   By: James  Green Jr M.D.   On: 08/11/2020 11:57   DG Abd 1 View  Result Date: 08/11/2020 CLINICAL DATA:  Abdominal distension, pain EXAM: ABDOMEN - 1 VIEW COMPARISON:  None. FINDINGS: Air-filled loops of mildly dilated small bowel measuring up to 4.2 cm. Air is present within nondistended colon. No large volume free air on this supine study. IMPRESSION: Mildly dilated loops of small bowel may reflect small bowel obstruction or ileus. Electronically Signed   By: Praneil  Patel M.D.   On: 08/11/2020 10:10   DG Chest Port 1 View  Result Date: 08/11/2020 CLINICAL DATA:  Pulmonary edema EXAM: PORTABLE CHEST 1 VIEW COMPARISON:  Yesterday FINDINGS: Esophageal extubation. Hazy bilateral pulmonary opacity from pleural effusions and atelectasis by most recent CTs. Cardiomegaly. No visible pneumothorax. IMPRESSION: Unchanged hazy chest opacification correlating with atelectasis and pleural fluid on recent CT. Electronically Signed   By: Jonathon  Watts M.D.   On: 08/11/2020 06:13   Scheduled Meds: . (feeding supplement) PROSource Plus  30 mL Oral TID BM  . acetaZOLAMIDE  8 mg/kg Oral Daily  . apixaban  5 mg Oral BID  . arformoterol  15 mcg Nebulization BID  . budesonide (PULMICORT) nebulizer solution  0.5 mg Nebulization BID  . chlorhexidine gluconate (MEDLINE KIT)  15 mL Mouth Rinse BID  . Chlorhexidine Gluconate Cloth  6 each Topical Daily  . furosemide  80 mg Intravenous BID  . insulin aspart  2-6 Units Subcutaneous Q4H  . losartan  100 mg Oral Daily  . mouth rinse  15 mL Mouth Rinse 10 times per day  . metoCLOPramide (REGLAN) injection  10 mg Intravenous  Q6H  . pantoprazole (PROTONIX) IV  40 mg Intravenous QHS  . revefenacin  175 mcg Nebulization Daily  . sodium chloride flush  3 mL Intravenous Q12H   Continuous Infusions: . sodium chloride    . feeding supplement (OSMOLITE 1.2 CAL) 60 mL/hr at 08/10/20 0700     LOS: 8 days    Time spent: 35min    Preetha Joseph, MD [facility]  08/12/2020, 11:33 AM   

## 2020-08-12 NOTE — Progress Notes (Signed)
Pt coughing up bloody mucous. Pt pulled out NG tube. Notified Elink RN concerning bloody output from NG tube. Pt on heparin drip. Elink RN informed this RN that she would notify MD.

## 2020-08-12 NOTE — Progress Notes (Addendum)
Cardiology Progress Note  Patient ID: Mitchell King MRN: [id number] DOB: August 04, 1950 Date of Encounter: 08/12/2020  Primary Cardiologist: No primary care provider on file.  Subjective   -3.2 L yesterday.  Creatinine stable at 1.2.  BP 138/77 today.  Remains on 4 L nasal cannula.  Reports dyspnea has improved.   Inpatient Medications  Scheduled Meds: . (feeding supplement) PROSource Plus  30 mL Oral TID BM  . acetaZOLAMIDE  8 mg/kg Oral Daily  . apixaban  5 mg Oral BID  . arformoterol  15 mcg Nebulization BID  . budesonide (PULMICORT) nebulizer solution  0.5 mg Nebulization BID  . chlorhexidine gluconate (MEDLINE KIT)  15 mL Mouth Rinse BID  . Chlorhexidine Gluconate Cloth  6 each Topical Daily  . furosemide  80 mg Intravenous BID  . insulin aspart  2-6 Units Subcutaneous Q4H  . losartan  100 mg Oral Daily  . mouth rinse  15 mL Mouth Rinse 10 times per day  . metoCLOPramide (REGLAN) injection  10 mg Intravenous Q6H  . pantoprazole (PROTONIX) IV  40 mg Intravenous QHS  . revefenacin  175 mcg Nebulization Daily  . sodium chloride flush  3 mL Intravenous Q12H   Continuous Infusions: . sodium chloride    . feeding supplement (OSMOLITE 1.2 CAL) 60 mL/hr at 08/10/20 0700   PRN Meds: sodium chloride, acetaminophen, bisacodyl, hydrALAZINE, ipratropium-albuterol, lactulose, ondansetron (ZOFRAN) IV, sodium chloride flush   Vital Signs   Vitals:   08/12/20 0412 08/12/20 0417 08/12/20 0741 08/12/20 0749  BP: 138/77     Pulse:   78   Resp: 19  (!) 22   Temp: 99.1 F (37.3 C)  99.2 F (37.3 C)   TempSrc: Oral  Oral   SpO2: 97%   96%  Weight:  72.5 kg    Height:        Intake/Output Summary (Last 24 hours) at 08/12/2020 1119 Last data filed at 08/12/2020 0754 Gross per 24 hour  Intake 751.75 ml  Output 3650 ml  Net -2898.25 ml   Last 3 Weights 08/12/2020 08/11/2020 08/10/2020  Weight (lbs) 159 lb 13.3 oz 169 lb 1.5 oz 179 lb 3.7 oz  Weight (kg) 72.5 kg 76.7 kg 81.3 kg       Telemetry  Normal sinus rhythm in 70s, frequent PVCs which I personally reviewed.   Physical Exam   Vitals:   08/12/20 0412 08/12/20 0417 08/12/20 0741 08/12/20 0749  BP: 138/77     Pulse:   78   Resp: 19  (!) 22   Temp: 99.1 F (37.3 C)  99.2 F (37.3 C)   TempSrc: Oral  Oral   SpO2: 97%   96%  Weight:  72.5 kg    Height:         Intake/Output Summary (Last 24 hours) at 08/12/2020 1119 Last data filed at 08/12/2020 0754 Gross per 24 hour  Intake 751.75 ml  Output 3650 ml  Net -2898.25 ml    Last 3 Weights 08/12/2020 08/11/2020 08/10/2020  Weight (lbs) 159 lb 13.3 oz 169 lb 1.5 oz 179 lb 3.7 oz  Weight (kg) 72.5 kg 76.7 kg 81.3 kg    Body mass index is 21.68 kg/m.   General: in no acute distress Neck: +JVD Cardiac: Normal S1, S2; RRR; no murmurs Lungs: CTA Abd: Mild distention Ext: No  edema Musculoskeletal: No deformities Skin: Warm and dry, no rashes   Neuro: Oriented to person and place  Labs  High Sensitivity Troponin:   Recent  Labs  Lab 08/04/20 2003  TROPONINIHS 44*     Cardiac EnzymesNo results for input(s): TROPONINI in the last 168 hours. No results for input(s): TROPIPOC in the last 168 hours.  Chemistry Recent Labs  Lab 08/09/20 1043 08/10/20 0546 08/11/20 0033 08/12/20 0034  NA 140 141 144 144  K 3.6 3.3* 3.1* 3.5  CL 96* 101 96* 98  CO2 33* 31 35* 35*  GLUCOSE 80 140* 87 100*  BUN 43* 48* 40* 40*  CREATININE 1.24 1.18 1.20 1.20  CALCIUM 9.1 8.5* 9.8 9.8  PROT 7.4 6.9 7.7  --   ALBUMIN 3.0* 2.8* 3.1*  --   AST 26 18 22   --   ALT 14 12 14   --   ALKPHOS 57 50 57  --   BILITOT 2.5* 2.0* 1.8*  --   GFRNONAA >60 >60 >60 >60  ANIONGAP 11 9 13 11     Hematology Recent Labs  Lab 08/10/20 0546 08/11/20 0033 08/12/20 0034  WBC 5.0 7.3 6.7  RBC 5.23 5.70 5.61  HGB 16.1 17.4* 17.1*  17.4*  HCT 50.2 57.1* 56.6*  54.5*  MCV 96.0 100.2* 100.9*  MCH 30.8 30.5 30.5  MCHC 32.1 30.5 30.2  RDW 14.1 14.1 13.9  PLT 183 194 184    BNP Recent Labs  Lab 08/08/20 1452  BNP 781.2*    DDimer  Recent Labs  Lab 08/05/20 2037  DDIMER 2.46*     Radiology  DG Abd 1 View  Result Date: 08/12/2020 CLINICAL DATA:  Ileus. EXAM: ABDOMEN - 1 VIEW COMPARISON:  08/11/2020. FINDINGS: NG tube has been removed. Persistent but improved slightly dilated loops of small bowel noted. Colonic gas pattern is normal. No free air identified. Degenerative changes lumbar spine and both hips. Aortoiliac atherosclerotic vascular calcification. Pelvic calcifications consistent phleboliths. Left base atelectasis/infiltrate. IMPRESSION: 1. NG tube has been removed. Persistent but improved slightly dilated loops of small bowel noted. Colonic gas pattern is normal. 2. Left base atelectasis/infiltrate. Electronically Signed   By: Marcello Moores  Register   On: 08/12/2020 06:38   DG Abd 1 View  Result Date: 08/11/2020 CLINICAL DATA:  Nasogastric tube placement. EXAM: ABDOMEN - 1 VIEW COMPARISON:  Same day. FINDINGS: Nasogastric tube tip is seen in proximal stomach. Stable mild small bowel dilatation is noted without colonic dilatation. IMPRESSION: Nasogastric tube tip seen in proximal stomach. Stable mild small bowel dilatation. Electronically Signed   By: Marijo Conception M.D.   On: 08/11/2020 11:57   DG Abd 1 View  Result Date: 08/11/2020 CLINICAL DATA:  Abdominal distension, pain EXAM: ABDOMEN - 1 VIEW COMPARISON:  None. FINDINGS: Air-filled loops of mildly dilated small bowel measuring up to 4.2 cm. Air is present within nondistended colon. No large volume free air on this supine study. IMPRESSION: Mildly dilated loops of small bowel may reflect small bowel obstruction or ileus. Electronically Signed   By: Macy Mis M.D.   On: 08/11/2020 10:10   DG Chest Port 1 View  Result Date: 08/11/2020 CLINICAL DATA:  Pulmonary edema EXAM: PORTABLE CHEST 1 VIEW COMPARISON:  Yesterday FINDINGS: Esophageal extubation. Hazy bilateral pulmonary opacity from pleural  effusions and atelectasis by most recent CTs. Cardiomegaly. No visible pneumothorax. IMPRESSION: Unchanged hazy chest opacification correlating with atelectasis and pleural fluid on recent CT. Electronically Signed   By: Monte Fantasia M.D.   On: 08/11/2020 06:13    Cardiac Studies  TTE 08/05/2020 1. Left ventricular ejection fraction, by estimation, is 55 to 60%. The  left  ventricle has normal function. The left ventricle has no regional  wall motion abnormalities. There is mild concentric left ventricular  hypertrophy. Left ventricular diastolic  parameters were normal. There is the interventricular septum is flattened  in systole, consistent with right ventricular pressure overload.  2. Right ventricular systolic function is normal. The right ventricular  size is severely enlarged. Moderately increased right ventricular wall  thickness. There is mildly elevated pulmonary artery systolic pressure.  The estimated right ventricular  systolic pressure is 02.7 mmHg (suspect this is an underestimation due to  the faint TR jet).  3. Right atrial size was severely dilated.  4. The mitral valve is normal in structure. No evidence of mitral valve  regurgitation. No evidence of mitral stenosis.  5. The aortic valve is tricuspid. Aortic valve regurgitation is not  visualized. No aortic stenosis is present.  6. The inferior vena cava is dilated in size with <50% respiratory  variability, suggesting right atrial pressure of 15 mmHg.   CT PE Study  1. Several subsegmental pulmonary emboli in the pulmonary arteries supplying the bilateral upper lobes and the left lower lobe. 2. Moderate bilateral pleural effusions, right greater than left, with associated atelectasis. 3. Aspirated debris and bronchial wall thickening in the right lower lobe likely reflect chronic aspiration. 4. Enlarged pulmonary artery likely representing pulmonary hypertension. Mild cardiomegaly. Patient Profile  Mitchell King is a 70 y.o. male with history of hypertension, COPD, diabetes who was admitted on 08/04/2020 with congestive heart failure.  He is found to have severe RV enlargement with reduced function in the setting of several pulmonary emboli.  Course complicated on 2/53/6644 by altered mentation/encephalopathy.  Assessment & Plan   Right heart failure/cor pulmonale: Admitted with 2-week history of shortness of breath and lower extremity edema.  Found to have multiple subsegmental PEs in several lobes.  Echocardiogram with severely dilated RV and function appears reduced.  Worsening encephalopathy 2/18, found to have hypercarbic respiratory failure requiring intubation.  Improved with diuresis, extubated 2/20.  No evidence of shock, has been normotensive, warm on exam, lactate normal -Continue IV lasix 80 mg BID -Discontinued carvedilol due to bradycardia  Acute Encephalopathy: found to have hypercapnia, required intubation 2/18.  Improved, extubated 2/20  PE: noted on CT 2/15.  R heart strain on echo.  Continue IV heparin   For questions or updates, please contact [facility] Please consult [email or web address] for contact info under      Donato Heinz, MD

## 2020-08-12 NOTE — Progress Notes (Signed)
Nutrition Follow-up  DOCUMENTATION CODES:   Not applicable  INTERVENTION:   -D/c Osmolite 1.2, secondary to no feeding access -Continue 30 ml Prosource Plus TID, each supplement provides 100 kcals and 15 grams protein -Boost Breeze po TID, each supplement provides 250 kcal and 9 grams of protein -MVI with minerals daily -RD will follow for diet advancement and adjust supplement regimen as appropriate  NUTRITION DIAGNOSIS:   Inadequate oral intake related to inability to eat as evidenced by NPO status.  Progressing; advanced to clear liquid diet on 08/12/20  GOAL:   Patient will meet greater than or equal to 90% of their needs  Progressing   MONITOR:   PO intake,Supplement acceptance,Diet advancement,Labs,Weight trends,Skin,I & O's  REASON FOR ASSESSMENT:   Ventilator,Consult Enteral/tube feeding initiation and management  ASSESSMENT:   70 y.o. male with medical history of diet-controlled DM, COPD, HTN, previous alcohol abuse, who has not seen doctors in years and has not been taking his medications for a while except for inhalers. He presented to the ED due to progressive SOB, cough, and orthopnea x2 weeks. He also noted worsening BLE edema.  2/18- intubated 2/29- TF initiated 2/20- extubated 2/21- NGT placed to low, intermittent suction 2/22- pt pulled out NGT, advanced to clear liquid diet  Reviewed I/O's: -3.2 L x 24 hours and -20.9 L since admission  UOP: 3.8 L x 24 hours  NGT output: 300 ml x 24 hours  Pt unavailable at time of visit.   Pt remains with encephalopathy and continues to receive lactulose. Per MD notes, ileus is improving and pt had a BM last night. He was advanced to clear liquid diet. No meal completion data currently available at this time.   Medications reviewed and include lasix, lactulose, reglan,  Labs reviewed: CBGS: 86-150 (inpatient orders for glycemic control are 2-6 units insulin aspart every 4 hours).   Diet Order:   Diet Order             Diet clear liquid Room service appropriate? Yes; Fluid consistency: Thin  Diet effective now                 EDUCATION NEEDS:   No education needs have been identified at this time  Skin:  Skin Assessment: Reviewed RN Assessment  Last BM:  08/11/20  Height:   Ht Readings from Last 1 Encounters:  08/08/20 6' (1.829 m)    Weight:   Wt Readings from Last 1 Encounters:  08/12/20 72.5 kg    Ideal Body Weight:  80.91 kg  BMI:  Body mass index is 21.68 kg/m.  Estimated Nutritional Needs:   Kcal:  2150-2350  Protein:  110-125 grams  Fluid:  2 L    Levada Schilling, RD, LDN, CDCES Registered Dietitian II Certified Diabetes Care and Education Specialist Please refer to [facility] for RD and/or RD on-call/weekend/after hours pager

## 2020-08-13 ENCOUNTER — Inpatient Hospital Stay (HOSPITAL_COMMUNITY): Payer: Medicare Other

## 2020-08-13 DIAGNOSIS — I2609 Other pulmonary embolism with acute cor pulmonale: Secondary | ICD-10-CM | POA: Diagnosis not present

## 2020-08-13 DIAGNOSIS — I50811 Acute right heart failure: Secondary | ICD-10-CM | POA: Diagnosis not present

## 2020-08-13 LAB — BASIC METABOLIC PANEL
Anion gap: 9 (ref 5–15)
BUN: 34 mg/dL — ABNORMAL HIGH (ref 8–23)
CO2: 36 mmol/L — ABNORMAL HIGH (ref 22–32)
Calcium: 9 mg/dL (ref 8.9–10.3)
Chloride: 92 mmol/L — ABNORMAL LOW (ref 98–111)
Creatinine, Ser: 1.02 mg/dL (ref 0.61–1.24)
GFR, Estimated: >60 mL/min (ref >60)
Glucose, Bld: 189 mg/dL — ABNORMAL HIGH (ref 70–99)
Potassium: 2.9 mmol/L — ABNORMAL LOW (ref 3.5–5.1)
Sodium: 137 mmol/L (ref 135–145)

## 2020-08-13 LAB — CBC
HCT: 53.1 % — ABNORMAL HIGH (ref 39.0–52.0)
Hemoglobin: 16.7 g/dL (ref 13.0–17.0)
MCH: 31.3 pg (ref 26.0–34.0)
MCHC: 31.5 g/dL (ref 30.0–36.0)
MCV: 99.6 fL (ref 80.0–100.0)
Platelets: 156 10*3/uL (ref 150–400)
RBC: 5.33 MIL/uL (ref 4.22–5.81)
RDW: 13.7 % (ref 11.5–15.5)
WBC: 5.9 10*3/uL (ref 4.0–10.5)
nRBC: 0 % (ref 0.0–0.2)

## 2020-08-13 LAB — GLUCOSE, CAPILLARY
Glucose-Capillary: 116 mg/dL — ABNORMAL HIGH (ref 70–99)
Glucose-Capillary: 143 mg/dL — ABNORMAL HIGH (ref 70–99)
Glucose-Capillary: 153 mg/dL — ABNORMAL HIGH (ref 70–99)
Glucose-Capillary: 153 mg/dL — ABNORMAL HIGH (ref 70–99)
Glucose-Capillary: 182 mg/dL — ABNORMAL HIGH (ref 70–99)
Glucose-Capillary: 86 mg/dL (ref 70–99)

## 2020-08-13 LAB — MAGNESIUM: Magnesium: 2.1 mg/dL (ref 1.7–2.4)

## 2020-08-13 LAB — COMPREHENSIVE METABOLIC PANEL
ALT: 13 U/L (ref 0–44)
AST: 19 U/L (ref 15–41)
Albumin: 2.9 g/dL — ABNORMAL LOW (ref 3.5–5.0)
Alkaline Phosphatase: 59 U/L (ref 38–126)
Anion gap: 10 (ref 5–15)
BUN: 37 mg/dL — ABNORMAL HIGH (ref 8–23)
CO2: 38 mmol/L — ABNORMAL HIGH (ref 22–32)
Calcium: 9.1 mg/dL (ref 8.9–10.3)
Chloride: 93 mmol/L — ABNORMAL LOW (ref 98–111)
Creatinine, Ser: 1.08 mg/dL (ref 0.61–1.24)
GFR, Estimated: >60 mL/min (ref >60)
Glucose, Bld: 57 mg/dL — ABNORMAL LOW (ref 70–99)
Potassium: 2.6 mmol/L — CL (ref 3.5–5.1)
Sodium: 141 mmol/L (ref 135–145)
Total Bilirubin: 2.5 mg/dL — ABNORMAL HIGH (ref 0.3–1.2)
Total Protein: 7.2 g/dL (ref 6.5–8.1)

## 2020-08-13 LAB — BRAIN NATRIURETIC PEPTIDE: B Natriuretic Peptide: 604.3 pg/mL — ABNORMAL HIGH (ref 0.0–100.0)

## 2020-08-13 MED ORDER — POTASSIUM CHLORIDE CRYS ER 20 MEQ PO TBCR
40.0000 meq | EXTENDED_RELEASE_TABLET | ORAL | Status: AC
Start: 2020-08-13 — End: 2020-08-13
  Administered 2020-08-13 (×2): 40 meq via ORAL
  Filled 2020-08-13 (×2): qty 2

## 2020-08-13 MED ORDER — POTASSIUM CHLORIDE CRYS ER 20 MEQ PO TBCR
40.0000 meq | EXTENDED_RELEASE_TABLET | Freq: Once | ORAL | Status: AC
  Administered 2020-08-13: 40 meq via ORAL
  Filled 2020-08-13: qty 2

## 2020-08-13 MED ORDER — POTASSIUM CHLORIDE 10 MEQ/100ML IV SOLN
10.0000 meq | INTRAVENOUS | Status: AC
  Administered 2020-08-13 (×3): 10 meq via INTRAVENOUS
  Filled 2020-08-13 (×3): qty 100

## 2020-08-13 NOTE — Progress Notes (Signed)
PROGRESS NOTE    Mitchell King  KXF:[id number] DOB: 1951-04-09 DOA: 08/04/2020 PCP: Patient, No Pcp Per   Brief Narrative: 70 year old with past medical history significant for ongoing tobacco abuse, prior alcohol assessment, diabetes type 2, who was admitted with acute hypoxic respiratory failure, right heart failure/cor pulmonale and bilateral PE, treated with diuretics and anticoagulation.  He developed progressive encephalopathy, was noted to have elevated ammonia level and a started on lactulose.  On 12/18 he developed worsening encephalopathy secondary to hypercarbia and hyperammonemia, was intubated and transferred to the ICU, hospitalization was complicated by ileus as well requiring NG decompression. -Patient mental status has improved an ileus as well.  He was extubated 12/20 -Transfer to [facility] service on 12/22.    Assessment & Plan:   Principal Problem:   Acute exacerbation of CHF (congestive heart failure) (HCC) Active Problems:   Chronic obstructive pulmonary disease (HCC)   Essential hypertension   Diabetes mellitus (HCC)   Nondependent alcohol abuse, continuous drinking behavior   Acute cor pulmonale (HCC)   Pulmonary embolism (HCC)   Right heart failure (HCC)   Acute respiratory failure (HCC)   Hyperammonemia (HCC)  1-Acute on Chronic Hypoxic and Hypercapnic Respiratory Failure, acute right heart failure/cor pulmonale; -Secondary to CHF, bilateral PE, underlying COPD. -Patient was intubated for worsening encephalopathy on 12/18 and extubated on 12/20. -Echo with preserved ejection fraction, right ventricle is significantly enlarged. -Cardiology following, holding Lasix IV due to severe hypokalemia. -Wean off oxygen as tolerated.  2-Acute bilateral pulmonary embolism and he has been on IV heparin for several days.  - He was transitioned to Eliquis 2/22. He was on 7 days IV heparin.   3-Encephalopathy:  -Multifactorial, secondary to hypercarbia, elevated ammonia,  could have chronic liver disease from alcoholism and as well as heart failure. -Continue with lactulose -He is alert and answering question.  4-Ileus: Appears to be improving, had bowel movement. Patient pull NG tube on 2/22 Tolerating clear diet.  Chronic liver diseases;  -Likely secondary to prior alcoholism and and right heart failure -Ultrasound noted abnormal echogenicity concerning for parenchymal liver disease -Bilirubin mildly elevated.  Urinary retention: Plan to do voiding trial tomorrow  Type 2 diabetes: Continue with a sliding scale insulin  COPD: Counseled about smoking cessation  Hypokalemia; replete IV and orally.    Nutrition Problem: Inadequate oral intake Etiology: inability to eat    Signs/Symptoms: NPO status    Interventions: Boost Breeze,MVI,Prostat  Estimated body mass index is 21.32 kg/m as calculated from the following:   Height as of this encounter: 6' (1.829 m).   Weight as of this encounter: 71.3 kg.   DVT prophylaxis: Eliquis Code Status: Full cdoe Family Communication:  Disposition Plan:  Status is: Inpatient  Remains inpatient appropriate because:IV treatments appropriate due to intensity of illness or inability to take PO   Dispo: The patient is from: Home              Anticipated d/c is to: SNF              Anticipated d/c date is: 2 days              Patient currently is not medically stable to d/c.   Difficult to place patient No        Consultants:   CCM  Cardiology   Procedures:   None  Antimicrobials:    Subjective: He is alert, he report passing gas, had BM.  He is breathing k, he is on  5 L oxygen still.   Objective: Vitals:   08/13/20 0345 08/13/20 0733 08/13/20 0745 08/13/20 0800  BP: 121/69   132/67  Pulse:    68  Resp: 20   20  Temp: 98.6 F (37 C)   98.4 F (36.9 C)  TempSrc: Oral   Oral  SpO2: 98% 97% 99% 99%  Weight: 71.3 kg     Height:        Intake/Output Summary (Last 24  hours) at 08/13/2020 1247 Last data filed at 08/13/2020 1100 Gross per 24 hour  Intake 960 ml  Output 2600 ml  Net -1640 ml   Filed Weights   08/11/20 0700 08/12/20 0417 08/13/20 0345  Weight: 76.7 kg 72.5 kg 71.3 kg    Examination:  General exam: Appears calm and comfortable  Respiratory system: Clear to auscultation. Respiratory effort normal. Cardiovascular system: S1 & S2 heard, RRR. No JVD, murmurs, rubs, gallops or clicks. No pedal edema. Gastrointestinal system: Abdomen is soft, mild distended, no rigidity.  Central nervous system: Alert and oriented. Extremities: no edema   Data Reviewed: I have personally reviewed following labs and imaging studies  CBC: Recent Labs  Lab 08/09/20 0059 08/10/20 0546 08/11/20 0033 08/12/20 0034 08/13/20 0326  WBC 3.6* 5.0 7.3 6.7 5.9  HGB 15.9 16.1 17.4* 17.1*  17.4* 16.7  HCT 52.4* 50.2 57.1* 56.6*  54.5* 53.1*  MCV 99.2 96.0 100.2* 100.9* 99.6  PLT 189 183 194 184 861   Basic Metabolic Panel: Recent Labs  Lab 08/09/20 1043 08/09/20 1835 08/10/20 0546 08/10/20 1602 08/11/20 0033 08/12/20 0034 08/13/20 0326 08/13/20 1044  NA 140  --  141  --  144 144 141 137  K 3.6  --  3.3*  --  3.1* 3.5 2.6* 2.9*  CL 96*  --  101  --  96* 98 93* 92*  CO2 33*  --  31  --  35* 35* 38* 36*  GLUCOSE 80  --  140*  --  87 100* 57* 189*  BUN 43*  --  48*  --  40* 40* 37* 34*  CREATININE 1.24  --  1.18  --  1.20 1.20 1.08 1.02  CALCIUM 9.1  --  8.5*  --  9.8 9.8 9.1 9.0  MG 2.5* 2.2 2.3 2.6*  --   --  2.1  --   PHOS 2.0* 3.5 4.3 5.0*  --   --   --   --    GFR: Estimated Creatinine Clearance: 68 mL/min (by C-G formula based on SCr of 1.02 mg/dL). Liver Function Tests: Recent Labs  Lab 08/08/20 0321 08/09/20 1043 08/10/20 0546 08/11/20 0033 08/13/20 0326  AST 28 26 18 22 19   ALT 16 14 12 14 13   ALKPHOS 62 57 50 57 59  BILITOT 1.9* 2.5* 2.0* 1.8* 2.5*  PROT 7.4 7.4 6.9 7.7 7.2  ALBUMIN 3.1* 3.0* 2.8* 3.1* 2.9*   No results  for input(s): LIPASE, AMYLASE in the last 168 hours. Recent Labs  Lab 08/07/20 0733 08/08/20 0321 08/12/20 0853  AMMONIA 75* 30 30   Coagulation Profile: Recent Labs  Lab 08/08/20 1452  INR 1.1   Cardiac Enzymes: No results for input(s): CKTOTAL, CKMB, CKMBINDEX, TROPONINI in the last 168 hours. BNP (last 3 results) No results for input(s): PROBNP in the last 8760 hours. HbA1C: No results for input(s): HGBA1C in the last 72 hours. CBG: Recent Labs  Lab 08/12/20 1951 08/13/20 0015 08/13/20 0404 08/13/20 0747 08/13/20 1153  GLUCAP 127*  153* 86 116* 153*   Lipid Profile: No results for input(s): CHOL, HDL, LDLCALC, TRIG, CHOLHDL, LDLDIRECT in the last 72 hours. Thyroid Function Tests: No results for input(s): TSH, T4TOTAL, FREET4, T3FREE, THYROIDAB in the last 72 hours. Anemia Panel: No results for input(s): VITAMINB12, FOLATE, FERRITIN, TIBC, IRON, RETICCTPCT in the last 72 hours. Sepsis Labs: Recent Labs  Lab 08/07/20 1002 08/07/20 1314 08/09/20 1058  LATICACIDVEN 1.1 1.1 1.2    Recent Results (from the past 240 hour(s))  SARS CORONAVIRUS 2 (TAT 6-24 HRS) Nasopharyngeal Nasopharyngeal Swab     Status: None   Collection Time: 08/05/20  1:03 AM   Specimen: Nasopharyngeal Swab  Result Value Ref Range Status   SARS Coronavirus 2 NEGATIVE NEGATIVE Final    Comment: (NOTE) SARS-CoV-2 target nucleic acids are NOT DETECTED.  The SARS-CoV-2 RNA is generally detectable in upper and lower respiratory specimens during the acute phase of infection. Negative results do not preclude SARS-CoV-2 infection, do not rule out co-infections with other pathogens, and should not be used as the sole basis for treatment or other patient management decisions. Negative results must be combined with clinical observations, patient history, and epidemiological information. The expected result is Negative.  Fact Sheet for Patients: SugarRoll.be  Fact  Sheet for Healthcare Providers: https://www.woods-mathews.com/  This test is not yet approved or cleared by the [place] FDA and  has been authorized for detection and/or diagnosis of SARS-CoV-2 by FDA under an Emergency Use Authorization (EUA). This EUA will remain  in effect (meaning this test can be used) for the duration of the COVID-19 declaration under Se ction 564(b)(1) of the Act, 21 U.S.C. section 360bbb-3(b)(1), unless the authorization is terminated or revoked sooner.  Performed at [facility], [place] [address], [place], [place] [address]   MRSA PCR Screening     Status: None   Collection Time: 08/08/20  6:39 PM   Specimen: Nasal Mucosa; Nasopharyngeal  Result Value Ref Range Status   MRSA by PCR NEGATIVE NEGATIVE Final    Comment:        The GeneXpert MRSA Assay (FDA approved for NASAL specimens only), is one component of a comprehensive MRSA colonization surveillance program. It is not intended to diagnose MRSA infection nor to guide or monitor treatment for MRSA infections. Performed at [facility], [place] [address], [place], [place] [address]          Radiology Studies: DG Abd 1 View  Result Date: 08/12/2020 CLINICAL DATA:  Ileus. EXAM: ABDOMEN - 1 VIEW COMPARISON:  08/11/2020. FINDINGS: NG tube has been removed. Persistent but improved slightly dilated loops of small bowel noted. Colonic gas pattern is normal. No free air identified. Degenerative changes lumbar spine and both hips. Aortoiliac atherosclerotic vascular calcification. Pelvic calcifications consistent phleboliths. Left base atelectasis/infiltrate. IMPRESSION: 1. NG tube has been removed. Persistent but improved slightly dilated loops of small bowel noted. Colonic gas pattern is normal. 2. Left base atelectasis/infiltrate. Electronically Signed   By: Marcello Moores  Register   On: 08/12/2020 06:38        Scheduled Meds: . (feeding supplement) PROSource Plus  30 mL Oral TID  BM  . acetaZOLAMIDE  8 mg/kg Oral Daily  . apixaban  5 mg Oral BID  . arformoterol  15 mcg Nebulization BID  . budesonide (PULMICORT) nebulizer solution  0.5 mg Nebulization BID  . chlorhexidine gluconate (MEDLINE KIT)  15 mL Mouth Rinse BID  . Chlorhexidine Gluconate Cloth  6 each Topical Daily  .  feeding supplement  1 Container Oral TID BM  . insulin aspart  2-6 Units Subcutaneous Q4H  . lactulose  20 g Oral BID  . losartan  100 mg Oral Daily  . mouth rinse  15 mL Mouth Rinse 10 times per day  . multivitamin with minerals  1 tablet Oral Daily  . pantoprazole (PROTONIX) IV  40 mg Intravenous QHS  . potassium chloride  40 mEq Oral Q4H  . revefenacin  175 mcg Nebulization Daily  . sodium chloride flush  3 mL Intravenous Q12H   Continuous Infusions: . sodium chloride       LOS: 9 days    Time spent: 35 minutes.     Elmarie Shiley, MD [facility]   If 7PM-7AM, please contact night-coverage [email or web address]  08/13/2020, 12:47 PM

## 2020-08-13 NOTE — Progress Notes (Signed)
Occupational Therapy Treatment Patient Details Name: Mitchell King MRN: [id number] DOB: 1951-06-04 Today's Date: 08/13/2020    History of present illness Mitchell King is a 70 y.o. male with PMH significant of diet-controlled diabetes, COPD, HTN, previous alcohol abuse, who has not seen doctors in years and has not been taking his medications for a while except for inhalers. Presents with progressive SOB, cough, PND and orthopnea as well as LE edema over the last 2 weeks. Found to have acute exacerbation of CHF secondary to HTN heart disease, acute PEs, acute respiratory failure and metabolic encephalopathy.   OT comments  Patient making small incremental gains towards OT stated goals.  OT attempted sitting edge of bed to increase overall level of alertness, up to Mod A for basic bed mobility.  Max A for seated grooming with hand over hand to wash his face and hands.  Patient continues to close eyes, and stand pivot to recliner not attempted.  SNF has been recommended for post acute rehab, and OT will continue efforts in the acute setting.  Patient presents with significant declines compared to prior level of care.    Follow Up Recommendations  SNF;Supervision/Assistance - 24 hour    Equipment Recommendations       Recommendations for Other Services      Precautions / Restrictions Precautions Precautions: Fall Precaution Comments: monitor O2 sats Restrictions Weight Bearing Restrictions: No       Mobility Bed Mobility Overal bed mobility: Needs Assistance Bed Mobility: Supine to Sit     Supine to sit: Mod assist Sit to supine: Mod assist   General bed mobility comments: tactile and verbal cuing for basic mobility.  Patient adjusted higher in the bed for comfort. Patient Response: Flat affect  Transfers                      Balance Overall balance assessment: Needs assistance Sitting-balance support: Feet supported Sitting balance-Leahy Scale: Poor                                      ADL either performed or assessed with clinical judgement   ADL Overall ADL's : Needs assistance/impaired     Grooming: Wash/dry hands;Wash/dry face;Oral care;Maximal assistance Grooming Details (indicate cue type and reason): thoroughness                             Functional mobility during ADLs: Moderate assistance;+2 for safety/equipment General ADL Comments: patient following simple commands.                       Cognition Arousal/Alertness: Awake/alert Behavior During Therapy: Flat affect Overall Cognitive Status: Impaired/Different from baseline                                 General Comments: trying to communicate, difficult to understand at times                          Pertinent Vitals/ Pain       Pain Assessment: Faces Faces Pain Scale: No hurt  Frequency  Min 2X/week        Progress Toward Goals  OT Goals(current goals can now be found in the care plan section)     Acute Rehab OT Goals Patient Stated Goal: unable to state OT Goal Formulation: Patient unable to participate in goal setting Time For Goal Achievement: 08/22/20 Potential to Achieve Goals: Good  Plan      Co-evaluation                 AM-PAC OT "6 Clicks" Daily Activity     Outcome Measure   Help from another person eating meals?: Total Help from another person taking care of personal grooming?: A Lot Help from another person toileting, which includes using toliet, bedpan, or urinal?: Total Help from another person bathing (including washing, rinsing, drying)?: Total Help from another person to put on and taking off regular upper body clothing?: Total Help from another person to put on and taking off regular lower body clothing?: Total 6 Click Score: 7    End of Session Equipment Utilized During Treatment:  Oxygen  OT Visit Diagnosis: Unsteadiness on feet (R26.81);Other abnormalities of gait and mobility (R26.89);Muscle weakness (generalized) (M62.81);Other symptoms and signs involving cognitive function   Activity Tolerance Patient limited by lethargy   Patient Left in bed;with call bell/phone within reach;with bed alarm set   Nurse Communication Mobility status        Time: 7510-2585 OT Time Calculation (min): 17 min  Charges: OT General Charges $OT Visit: 1 Visit OT Treatments $Self Care/Home Management : 8-22 mins 08/13/2020  Rich, OTR/L  Acute Rehabilitation Services  Office:  [phone]   Suzanna Obey 08/13/2020, 1:48 PM

## 2020-08-13 NOTE — Progress Notes (Addendum)
Lab notified this RN of critical value K+ is 2.6 this AM. MD notified

## 2020-08-13 NOTE — Progress Notes (Signed)
Heart Failure Stewardship Pharmacist Progress Note   PCP: Patient, No Pcp Per PCP-Cardiologist: No primary care provider on file.    HPI:  70 yo M with PMH of T2DM, COPD, HTN, prior alcohol abuse, and medication noncompliance. He presented to the ED On 08/04/20 with progressive shortness of breath, DOE, PND, and orthopnea over the last two weeks. He was admitted for acute CHF. An ECHO was done on 08/05/20 and LVEF is 55-60%. Also found to have several scattered small PE's. He was transferred to the ICU for hypercapnic respiratory failure, was intubated, then extubated over the weekend after being aggressively diuresed. He has been transferred back to the floor.   Current HF Medications: Diamox 8 mg/kg PO daily Losartan 100 mg daily  Prior to admission HF Medications: None  Pertinent Lab Values: . Serum creatinine 1.08, BUN 37, Potassium 2.6, Sodium 141, BNP 690.9  Vital Signs: . Weight: 157 lbs (admission weight: 180 lbs) . Blood pressure: 120-130/70s . Heart rate: 70s   Medication Assistance / Insurance Benefits Check: Does the patient have prescription insurance?  Yes Type of insurance plan: [employer]  Outpatient Pharmacy:  Prior to admission outpatient pharmacy: [facility] Is the patient willing to use [facility] TOC pharmacy at discharge? Yes   Assessment: 1. Acute diastolic CHF (EF 71-24%). NYHA class III symptoms. Remains fluid overloaded on MD exam.  - Holding furosemide 80 mg IV BID while K is being replaced. May be able to restart later this evening following repeat BMET. Continue diamox 8 mg/kg daily - Off carvedilol with bradycardia while in the ICU. Would hold off restarting until euvolemic. - Continue losartan 100 mg daily. Could consider optimizing further to [facility] given newer indication for LVEF <57% when able - Consider starting spironolactone 25 mg daily after further diuresis - Consider starting Jardiance 10 mg daily prior to discharge (following IV diuresis).  London Pepper is the SGLT2i approved on the [place] formulary   Plan:  1) Medication changes recommended at this time: - Continue current regimen; diuresis per cardiology  2) Patient assistance: - [facility] would benefit [facility] medications as they would be free for him (Eliquis is on the [place] formulary for treatment of VTE)  3)  Education  - To be completed prior to discharge  Sharen Hones, PharmD, BCPS Heart Failure Stewardship Pharmacist Phone [phone]

## 2020-08-13 NOTE — Discharge Instructions (Signed)
Information on my medicine - ELIQUIS (apixaban)  This medication education was reviewed with me or my healthcare representative as part of my discharge preparation.  The pharmacist that spoke with me during my hospital stay was:  Kimberli Winne, RPH-CPP  Why was Eliquis prescribed for you? Eliquis was prescribed to treat blood clots that may have been found in the veins of your legs (deep vein thrombosis) or in your lungs (pulmonary embolism) and to reduce the risk of them occurring again.  What do You need to know about Eliquis ? Continue Eliquis 5mg TWICE daily.  Eliquis may be taken with or without food.   Try to take the dose about the same time in the morning and in the evening. If you have difficulty swallowing the tablet whole please discuss with your pharmacist how to take the medication safely.  Take Eliquis exactly as prescribed and DO NOT stop taking Eliquis without talking to the doctor who prescribed the medication.  Stopping may increase your risk of developing a new blood clot.  Refill your prescription before you run out.  After discharge, you should have regular check-up appointments with your healthcare provider that is prescribing your Eliquis.    What do you do if you miss a dose? If a dose of ELIQUIS is not taken at the scheduled time, take it as soon as possible on the same day and twice-daily administration should be resumed. The dose should not be doubled to make up for a missed dose.  Important Safety Information A possible side effect of Eliquis is bleeding. You should call your healthcare provider right away if you experience any of the following: ? Bleeding from an injury or your nose that does not stop. ? Unusual colored urine (red or dark brown) or unusual colored stools (red or black). ? Unusual bruising for unknown reasons. ? A serious fall or if you hit your head (even if there is no bleeding).  Some medicines may interact with Eliquis and might  increase your risk of bleeding or clotting while on Eliquis. To help avoid this, consult your healthcare provider or pharmacist prior to using any new prescription or non-prescription medications, including herbals, vitamins, non-steroidal anti-inflammatory drugs (NSAIDs) and supplements.  This website has more information on Eliquis (apixaban): http://www.eliquis.com/eliquis/home   

## 2020-08-13 NOTE — Plan of Care (Signed)
  Problem: Education: Goal: Ability to demonstrate management of disease process will improve Outcome: Progressing Goal: Ability to verbalize understanding of medication therapies will improve Outcome: Progressing   

## 2020-08-13 NOTE — Progress Notes (Signed)
Cardiology Progress Note  Patient ID: Mitchell King MRN: [id number] DOB: [birth year] Date of Encounter: 08/13/2020  Primary Cardiologist: No primary care provider on file.  Subjective   -3.1 L yesterday.  Creatinine stable at 1.1.  BP 121/69 today.  Remains on 5 L nasal cannula.  K 2.6 today.  Reports dyspnea improved   Inpatient Medications  Scheduled Meds: . (feeding supplement) PROSource Plus  30 mL Oral TID BM  . acetaZOLAMIDE  8 mg/kg Oral Daily  . apixaban  5 mg Oral BID  . arformoterol  15 mcg Nebulization BID  . budesonide (PULMICORT) nebulizer solution  0.5 mg Nebulization BID  . chlorhexidine gluconate (MEDLINE KIT)  15 mL Mouth Rinse BID  . Chlorhexidine Gluconate Cloth  6 each Topical Daily  . feeding supplement  1 Container Oral TID BM  . insulin aspart  2-6 Units Subcutaneous Q4H  . lactulose  20 g Oral BID  . losartan  100 mg Oral Daily  . mouth rinse  15 mL Mouth Rinse 10 times per day  . multivitamin with minerals  1 tablet Oral Daily  . pantoprazole (PROTONIX) IV  40 mg Intravenous QHS  . revefenacin  175 mcg Nebulization Daily  . sodium chloride flush  3 mL Intravenous Q12H   Continuous Infusions: . sodium chloride     PRN Meds: sodium chloride, acetaminophen, bisacodyl, hydrALAZINE, ipratropium-albuterol, ondansetron (ZOFRAN) IV, sodium chloride flush   Vital Signs   Vitals:   08/12/20 2021 08/13/20 0048 08/13/20 0345 08/13/20 0733  BP:  129/67 121/69   Pulse:  63    Resp:  18 20   Temp:  99.3 F (37.4 C) 98.6 F (37 C)   TempSrc:  Oral Oral   SpO2: 96% 100% 98% 97%  Weight:   71.3 kg   Height:        Intake/Output Summary (Last 24 hours) at 08/13/2020 0931 Last data filed at 08/13/2020 0351 Gross per 24 hour  Intake 720 ml  Output 2750 ml  Net -2030 ml   Last 3 Weights 08/13/2020 08/12/2020 08/11/2020  Weight (lbs) 157 lb 3 oz 159 lb 13.3 oz 169 lb 1.5 oz  Weight (kg) 71.3 kg 72.5 kg 76.7 kg      Telemetry  Normal sinus rhythm in 60s,  PVCs which I personally reviewed.   Physical Exam   Vitals:   08/12/20 2021 08/13/20 0048 08/13/20 0345 08/13/20 0733  BP:  129/67 121/69   Pulse:  63    Resp:  18 20   Temp:  99.3 F (37.4 C) 98.6 F (37 C)   TempSrc:  Oral Oral   SpO2: 96% 100% 98% 97%  Weight:   71.3 kg   Height:         Intake/Output Summary (Last 24 hours) at 08/13/2020 0931 Last data filed at 08/13/2020 0351 Gross per 24 hour  Intake 720 ml  Output 2750 ml  Net -2030 ml    Last 3 Weights 08/13/2020 08/12/2020 08/11/2020  Weight (lbs) 157 lb 3 oz 159 lb 13.3 oz 169 lb 1.5 oz  Weight (kg) 71.3 kg 72.5 kg 76.7 kg    Body mass index is 21.32 kg/m.   General: in no acute distress Neck: +JVD Cardiac: Normal S1, S2; RRR; no murmurs Lungs: CTA Abd: Mild distention Ext: No  edema Musculoskeletal: No deformities Skin: Warm and dry, no rashes   Neuro: Oriented to person and place  Labs  High Sensitivity Troponin:   Recent Labs  Lab  08/04/20 2003  TROPONINIHS 44*     Cardiac EnzymesNo results for input(s): TROPONINI in the last 168 hours. No results for input(s): TROPIPOC in the last 168 hours.  Chemistry Recent Labs  Lab 08/10/20 0546 08/11/20 0033 08/12/20 0034 08/13/20 0326  NA 141 144 144 141  K 3.3* 3.1* 3.5 2.6*  CL 101 96* 98 93*  CO2 31 35* 35* 38*  GLUCOSE 140* 87 100* 57*  BUN 48* 40* 40* 37*  CREATININE 1.18 1.20 1.20 1.08  CALCIUM 8.5* 9.8 9.8 9.1  PROT 6.9 7.7  --  7.2  ALBUMIN 2.8* 3.1*  --  2.9*  AST 18 22  --  19  ALT 12 14  --  13  ALKPHOS 50 57  --  59  BILITOT 2.0* 1.8*  --  2.5*  GFRNONAA >60 >60 >60 >60  ANIONGAP _0 Hematology Recent Labs  Lab 08/11/20 0033 08/12/20 0034 08/13/20 0326  WBC 7.3 6.7 5.9  RBC 5.70 5.61 5.33  HGB 17.4* 17.1*  17.4* 16.7  HCT 57.1* 56.6*  54.5* 53.1*  MCV 100.2* 100.9* 99.6  MCH 30.5 30.5 31.3  MCHC 30.5 30.2 31.5  RDW 14.1 13.9 13.7  PLT 194 184 156   BNP Recent Labs  Lab 08/08/20 1452 08/13/20 0326  BNP  781.2* 604.3*    DDimer  No results for input(s): DDIMER in the last 168 hours.   Radiology  DG Abd 1 View  Result Date: 08/12/2020 CLINICAL DATA:  Ileus. EXAM: ABDOMEN - 1 VIEW COMPARISON:  08/11/2020. FINDINGS: NG tube has been removed. Persistent but improved slightly dilated loops of small bowel noted. Colonic gas pattern is normal. No free air identified. Degenerative changes lumbar spine and both hips. Aortoiliac atherosclerotic vascular calcification. Pelvic calcifications consistent phleboliths. Left base atelectasis/infiltrate. IMPRESSION: 1. NG tube has been removed. Persistent but improved slightly dilated loops of small bowel noted. Colonic gas pattern is normal. 2. Left base atelectasis/infiltrate. Electronically Signed   By: Marcello Moores  Register   On: 08/12/2020 06:38   DG Abd 1 View  Result Date: 08/11/2020 CLINICAL DATA:  Nasogastric tube placement. EXAM: ABDOMEN - 1 VIEW COMPARISON:  Same day. FINDINGS: Nasogastric tube tip is seen in proximal stomach. Stable mild small bowel dilatation is noted without colonic dilatation. IMPRESSION: Nasogastric tube tip seen in proximal stomach. Stable mild small bowel dilatation. Electronically Signed   By: Marijo Conception M.D.   On: 08/11/2020 11:57   DG Abd 1 View  Result Date: 08/11/2020 CLINICAL DATA:  Abdominal distension, pain EXAM: ABDOMEN - 1 VIEW COMPARISON:  None. FINDINGS: Air-filled loops of mildly dilated small bowel measuring up to 4.2 cm. Air is present within nondistended colon. No large volume free air on this supine study. IMPRESSION: Mildly dilated loops of small bowel may reflect small bowel obstruction or ileus. Electronically Signed   By: Macy Mis M.D.   On: 08/11/2020 10:10    Cardiac Studies  TTE 08/05/2020 1. Left ventricular ejection fraction, by estimation, is 55 to 60%. The  left ventricle has normal function. The left ventricle has no regional  wall motion abnormalities. There is mild concentric left  ventricular  hypertrophy. Left ventricular diastolic  parameters were normal. There is the interventricular septum is flattened  in systole, consistent with right ventricular pressure overload.  2. Right ventricular systolic function is normal. The right ventricular  size is severely enlarged. Moderately increased right ventricular wall  thickness. There is mildly elevated pulmonary  artery systolic pressure.  The estimated right ventricular  systolic pressure is 32.9 mmHg (suspect this is an underestimation due to  the faint TR jet).  3. Right atrial size was severely dilated.  4. The mitral valve is normal in structure. No evidence of mitral valve  regurgitation. No evidence of mitral stenosis.  5. The aortic valve is tricuspid. Aortic valve regurgitation is not  visualized. No aortic stenosis is present.  6. The inferior vena cava is dilated in size with <50% respiratory  variability, suggesting right atrial pressure of 15 mmHg.   CT PE Study  1. Several subsegmental pulmonary emboli in the pulmonary arteries supplying the bilateral upper lobes and the left lower lobe. 2. Moderate bilateral pleural effusions, right greater than left, with associated atelectasis. 3. Aspirated debris and bronchial wall thickening in the right lower lobe likely reflect chronic aspiration. 4. Enlarged pulmonary artery likely representing pulmonary hypertension. Mild cardiomegaly. Patient Profile  Mitchell King is a 70 y.o. male with history of hypertension, COPD, diabetes who was admitted on 08/04/2020 with congestive heart failure.  He is found to have severe RV enlargement with reduced function in the setting of several pulmonary emboli.  Course complicated on 11/06/8414 by altered mentation/encephalopathy.  Assessment & Plan   Right heart failure/cor pulmonale: Admitted with 2-week history of shortness of breath and lower extremity edema.  Found to have multiple subsegmental PEs in several  lobes.  Echocardiogram with severely dilated RV and function appears reduced.  Worsening encephalopathy 2/18, found to have hypercarbic respiratory failure requiring intubation.  Improved with diuresis, extubated 2/20.  No evidence of shock, has been normotensive, warm on exam, lactate normal -Hold lasix dose this morning as K down to 2.6.  Would replete for goal K>4.  Will recheck PM BMET, can resume diuresis if K improved.  Appears euvolemic on exam, can likely switch to PO torsemide once K improved  Acute Encephalopathy: found to have hypercapnia, required intubation 2/18.  Improved, extubated 2/20  PE: noted on CT 2/15.  R heart strain on echo.  Continue IV heparin   For questions or updates, please contact [place] Please consult [email or web address] for contact info under      Donato Heinz, MD

## 2020-08-14 ENCOUNTER — Telehealth: Payer: Self-pay | Admitting: *Deleted

## 2020-08-14 DIAGNOSIS — E440 Moderate protein-calorie malnutrition: Secondary | ICD-10-CM | POA: Insufficient documentation

## 2020-08-14 DIAGNOSIS — I5033 Acute on chronic diastolic (congestive) heart failure: Secondary | ICD-10-CM | POA: Diagnosis not present

## 2020-08-14 LAB — GLUCOSE, CAPILLARY
Glucose-Capillary: 102 mg/dL — ABNORMAL HIGH (ref 70–99)
Glucose-Capillary: 104 mg/dL — ABNORMAL HIGH (ref 70–99)
Glucose-Capillary: 106 mg/dL — ABNORMAL HIGH (ref 70–99)
Glucose-Capillary: 131 mg/dL — ABNORMAL HIGH (ref 70–99)
Glucose-Capillary: 134 mg/dL — ABNORMAL HIGH (ref 70–99)
Glucose-Capillary: 160 mg/dL — ABNORMAL HIGH (ref 70–99)

## 2020-08-14 LAB — CBC
HCT: 52 % (ref 39.0–52.0)
Hemoglobin: 16.3 g/dL (ref 13.0–17.0)
MCH: 30.9 pg (ref 26.0–34.0)
MCHC: 31.3 g/dL (ref 30.0–36.0)
MCV: 98.5 fL (ref 80.0–100.0)
Platelets: 154 10*3/uL (ref 150–400)
RBC: 5.28 MIL/uL (ref 4.22–5.81)
RDW: 13.2 % (ref 11.5–15.5)
WBC: 4.7 10*3/uL (ref 4.0–10.5)
nRBC: 0 % (ref 0.0–0.2)

## 2020-08-14 LAB — SARS CORONAVIRUS 2 (TAT 6-24 HRS): SARS Coronavirus 2: NEGATIVE

## 2020-08-14 LAB — BASIC METABOLIC PANEL
Anion gap: 7 (ref 5–15)
BUN: 31 mg/dL — ABNORMAL HIGH (ref 8–23)
CO2: 30 mmol/L (ref 22–32)
Calcium: 8.7 mg/dL — ABNORMAL LOW (ref 8.9–10.3)
Chloride: 94 mmol/L — ABNORMAL LOW (ref 98–111)
Creatinine, Ser: 0.82 mg/dL (ref 0.61–1.24)
GFR, Estimated: >60 mL/min (ref >60)
Glucose, Bld: 92 mg/dL (ref 70–99)
Potassium: 4.4 mmol/L (ref 3.5–5.1)
Sodium: 131 mmol/L — ABNORMAL LOW (ref 135–145)

## 2020-08-14 MED ORDER — ENSURE ENLIVE PO LIQD
237.0000 mL | Freq: Two times a day (BID) | ORAL | Status: DC
  Administered 2020-08-14 – 2020-08-16 (×6): 237 mL via ORAL

## 2020-08-14 MED ORDER — POTASSIUM CHLORIDE CRYS ER 20 MEQ PO TBCR
40.0000 meq | EXTENDED_RELEASE_TABLET | Freq: Every day | ORAL | Status: DC
  Administered 2020-08-14 – 2020-08-16 (×3): 40 meq via ORAL
  Filled 2020-08-14 (×3): qty 2

## 2020-08-14 MED ORDER — TORSEMIDE 20 MG PO TABS
40.0000 mg | ORAL_TABLET | Freq: Every day | ORAL | Status: DC
  Administered 2020-08-14: 40 mg via ORAL
  Filled 2020-08-14 (×2): qty 2

## 2020-08-14 MED ORDER — COVID-19 MRNA VAC-TRIS(PFIZER) 30 MCG/0.3ML IM SUSP
0.3000 mL | Freq: Once | INTRAMUSCULAR | Status: AC
  Administered 2020-08-14: 0.3 mL via INTRAMUSCULAR
  Filled 2020-08-14: qty 0.3

## 2020-08-14 NOTE — Progress Notes (Signed)
Initial Nutrition Assessment  DOCUMENTATION CODES:   Non-severe (moderate) malnutrition in context of chronic illness  INTERVENTION:   -D/c Boost Breeze po TID, each supplement provides 250 kcal and 9 grams of protein -D/c Prosource Plus -Ensure Enlive po BID, each supplement provides 350 kcal and 20 grams of protein -Continue MVI with minerals daily  NUTRITION DIAGNOSIS:   Moderate Malnutrition related to chronic illness (COPD, CHF) as evidenced by mild fat depletion,moderate fat depletion,mild muscle depletion,moderate muscle depletion.  Progressing; advanced to heart healthy diet on 08/14/20  GOAL:   Patient will meet greater than or equal to 90% of their needs  Progressing   MONITOR:   PO intake,Supplement acceptance,Labs,Weight trends,Skin,I & O's  REASON FOR ASSESSMENT:   Ventilator,Consult Enteral/tube feeding initiation and management  ASSESSMENT:   70 y.o. male with medical history of diet-controlled DM, COPD, HTN, previous alcohol abuse, who has not seen doctors in years and has not been taking his medications for a while except for inhalers. He presented to the ED due to progressive SOB, cough, and orthopnea x2 weeks. He also noted worsening BLE edema.  2/18- intubated 2/29- TF initiated 2/20- extubated 2/21- NGT placed to low, intermittent suction 2/22- pt pulled out NGT, advanced to clear liquid diet 2/23- advanced to full liquid diet 2/24- advanced to heart healthy diet   Reviewed I/O's: -1.1 L x 24 hours and -25.2 L since admission  UOP: 2 L x 24 hours  Spoke with pt at bedside, who reports feeling better today. He shares he has been consuming most of his meals. Noted documented meal intake 60-100%. Pt shares that he consumed some potato soup this morning, but is ready to try some solid foods.   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 He would usually consume breakfast foods  such as toast, eggs, and bacon after her got off work and "nibble on a snack or sandwich" around lunch time. Pt shares that his son often bring him a plate of food daily, which he will eat in the morning or save until the afternoon if he purchased breakfast after his shift. He eat with dentures, which he has access to here, and denies any difficulty chewing or swallowing foods.   Pt denies any weight loss PTA and shares that his weight fluctuates throughout the year depending on the season and how active he is. Reviewed wt hx, which reveals mild distant wt loss. Pt reports that he thinks he has lost weight during hospitalization due to decreased abdominal distention.   Discussed importance of good meal and supplement intake to promote healing. Pt amenable to Ensure, but does not like Boost Breeze, reporting it gives him diarrhea.   Medications reviewed and include lactulose  Labs reviewed: CBGS: 102-160 (inpatient orders for glycemic control are 2-6 units insulin aspart every 4 hours).   NUTRITION - FOCUSED PHYSICAL EXAM:  Flowsheet Row Most Recent Value  Orbital Region Moderate depletion  Upper Arm Region Moderate depletion  Thoracic and Lumbar Region Mild depletion  Buccal Region Mild depletion  Temple Region Moderate depletion  Clavicle Bone Region Moderate depletion  Clavicle and Acromion Bone Region Moderate depletion  Scapular Bone Region Moderate depletion  Dorsal Hand Moderate depletion  Patellar Region Mild depletion  Anterior Thigh Region Mild depletion  Posterior Calf Region Mild depletion  Edema (RD Assessment) Mild  Hair Reviewed  Eyes Reviewed  Mouth Reviewed  Skin Reviewed  Nails Reviewed  Diet Order:   Diet Order            Diet Heart Room service appropriate? Yes; Fluid consistency: Thin  Diet effective now                 EDUCATION NEEDS:   Education needs have been addressed  Skin:  Skin Assessment: Reviewed RN Assessment  Last BM:   08/13/20  Height:   Ht Readings from Last 1 Encounters:  08/08/20 6' (1.829 m)    Weight:   Wt Readings from Last 1 Encounters:  08/14/20 75.5 kg    Ideal Body Weight:  80.91 kg  BMI:  Body mass index is 22.57 kg/m.  Estimated Nutritional Needs:   Kcal:  2150-2350  Protein:  110-125 grams  Fluid:  2 L    Levada Schilling, RD, LDN, CDCES Registered Dietitian II Certified Diabetes Care and Education Specialist Please refer to [facility] for RD and/or RD on-call/weekend/after hours pager

## 2020-08-14 NOTE — Telephone Encounter (Signed)
[facility] received several paper and electronic prior authorization requests for this patient's medication in Dr Comer's name. [facility] unable to complete these prior authorizations, as he is inpatient and not a [facility] patient. Andree Coss, RN

## 2020-08-14 NOTE — TOC Progression Note (Signed)
Transition of Care [facility]) - Progression Note    Patient Details  Name: Mitchell King MRN: [id number] Date of Birth: 06/07/1951  Transition of [facility]) CM/SW Contact  Azelie Noguera [place], [place] Phone Number: 08/14/2020, 4:31 PM  Clinical Narrative:     [facility] requesting pt to get covid booster vaccine so he does not need an isolation room. Pt and pt son are agreeable. MD notified and booster ordered. CSW started insurance auth. Ref #[id number]  Expected Discharge Plan: Skilled Nursing Facility Barriers to Discharge: Continued Medical Work up  Expected Discharge Plan and Services Expected Discharge Plan: Skilled Nursing Facility       Living arrangements for the past 2 months: Single Family Home                                       Social Determinants of Health (SDOH) Interventions    Readmission Risk Interventions No flowsheet data found.

## 2020-08-14 NOTE — Progress Notes (Signed)
Heart Failure Stewardship Pharmacist Progress Note   PCP: Patient, No Pcp Per PCP-Cardiologist: No primary care provider on file.    HPI:  70 yo M with PMH of T2DM, COPD, HTN, prior alcohol abuse, and medication noncompliance. He presented to the ED On 08/04/20 with progressive shortness of breath, DOE, PND, and orthopnea over the last two weeks. He was admitted for acute CHF. An ECHO was done on 08/05/20 and LVEF is 55-60%. Also found to have several scattered small PE's. He was transferred to the ICU for hypercapnic respiratory failure, was intubated, then extubated over the weekend after being aggressively diuresed. He has been transferred back to the floor.   Current HF Medications: Diamox 8 mg/kg PO daily Torsemide 40 mg PO daily Losartan 100 mg daily  Prior to admission HF Medications: None  Pertinent Lab Values: . Serum creatinine 0.82, BUN 31, Potassium 4.4, Sodium 131, BNP 690.9  Vital Signs: . Weight: 166 lbs (admission weight: 180 lbs) . Blood pressure: 110-120s/70s . Heart rate: 70s   Medication Assistance / Insurance Benefits Check: Does the patient have prescription insurance?  Yes Type of insurance plan: [employer]  Outpatient Pharmacy:  Prior to admission outpatient pharmacy: [facility] Is the patient willing to use [facility] TOC pharmacy at discharge? Yes   Assessment: 1. Acute diastolic CHF (EF 52-77%). NYHA class III symptoms.  - Continue torsemide 40 mg PO daily, K+ 4.4 this AM. Per cardiology note, pt appears euvolemic, no need for IV diuretics. Recommend discontinuing diamox as CO2 has improved. - Holding carvedilol while in the ICU. Would hold off restarting until pt more stable overall.  - Continue losartan 100 mg daily. Could consider optimizing further to [facility] given newer indication for LVEF <57% when able - Consider starting spironolactone 25 mg daily after further diuresis - Consider starting Jardiance 10 mg daily prior to discharge (following IV  diuresis). [place] Pepper is the SGLT2i approved on the [place] formulary   Plan:  1) Medication changes recommended at this time: - Continue current regimen; recommend discontinuing diamox  2) Patient assistance: - Patient would benefit from utilizing [place] pharmacy for chronic medications as they would be free for him (Eliquis is on the [place] formulary for treatment of VTE)  3)  Education  - To be completed prior to discharge  Sharen Hones, PharmD, BCPS Heart Failure Stewardship Pharmacist Phone [phone]

## 2020-08-14 NOTE — Progress Notes (Signed)
Foley catheter out, patient is due to void. Educated him on how to use urinal or call for help to the bathroom.

## 2020-08-14 NOTE — Progress Notes (Addendum)
PROGRESS NOTE    Mitchell King  CHY:[id number] DOB: 10-16-1950 DOA: 08/04/2020 PCP: Patient, No Pcp Per   Brief Narrative: 70 year old with past medical history significant for ongoing tobacco abuse, prior alcohol assessment, diabetes type 2, who was admitted with acute hypoxic respiratory failure, right heart failure/cor pulmonale and bilateral PE, treated with diuretics and anticoagulation.  He developed progressive encephalopathy, was noted to have elevated ammonia level and a started on lactulose.  On 12/18 he developed worsening encephalopathy secondary to hypercarbia and hyperammonemia, was intubated and transferred to the ICU, hospitalization was complicated by ileus as well requiring NG decompression. -Patient mental status has improved an ileus as well.  He was extubated 12/20 -Transfer to [facility] service on 12/22.    Assessment & Plan:   Principal Problem:   Acute exacerbation of CHF (congestive heart failure) (HCC) Active Problems:   Chronic obstructive pulmonary disease (HCC)   Essential hypertension   Diabetes mellitus (HCC)   Nondependent alcohol abuse, continuous drinking behavior   Acute cor pulmonale (HCC)   Pulmonary embolism (HCC)   Right heart failure (HCC)   Acute respiratory failure (HCC)   Hyperammonemia (HCC)   Malnutrition of moderate degree  1-Acute on Chronic Hypoxic and Hypercapnic Respiratory Failure, acute right heart failure/cor pulmonale; -Secondary to CHF, bilateral PE, underlying COPD. -Patient was intubated for worsening encephalopathy on 12/18 and extubated on 12/20. -Echo with preserved ejection fraction, right ventricle is significantly enlarged. -Cardiology following, holding Lasix IV due to severe hypokalemia on 2/23. -Wean off oxygen as tolerated. Down to 2 L.  -Transition to oral torsemide today.   2-Acute bilateral pulmonary embolism and he has been on IV heparin for several days.  - He was transitioned to Eliquis 2/22. He was on 7 days IV  heparin.   3-Encephalopathy:  -Multifactorial, secondary to hypercarbia, elevated ammonia, could have chronic liver disease from alcoholism and as well as heart failure. -Continue with lactulose -alert, and answering question appropriatly.   4-Ileus: Appears to be improving, had bowel movement. Patient pull NG tube on 2/22 Had BM yesterday. Plan to advance diet to heart healthy today.   Chronic liver diseases;  -Likely secondary to prior alcoholism and and right heart failure -Ultrasound noted abnormal echogenicity concerning for parenchymal liver disease -Bilirubin mildly elevated.  Urinary retention: Voiding trial today.   Type 2 diabetes: Continue with a sliding scale insulin  COPD: Counseled about smoking cessation  Hypokalemia; replenished.  Oral potasium supplement.   Nutrition Problem: Moderate Malnutrition Etiology: chronic illness (COPD, CHF)    Signs/Symptoms: mild fat depletion,moderate fat depletion,mild muscle depletion,moderate muscle depletion    Interventions: Ensure Enlive (each supplement provides 350kcal and 20 grams of protein),MVI  Estimated body mass index is 22.57 kg/m as calculated from the following:   Height as of this encounter: 6' (1.829 m).   Weight as of this encounter: 75.5 kg.   DVT prophylaxis: Eliquis Code Status: Full cdoe Family Communication: Son updated.  Disposition Plan:  Status is: Inpatient  Remains inpatient appropriate because:IV treatments appropriate due to intensity of illness or inability to take PO   Dispo: The patient is from: Home              Anticipated d/c is to: SNF              Anticipated d/c date is: 1 day              Patient currently is not medically stable to d/c.   Difficult to place  patient No        Consultants:   CCM  Cardiology   Procedures:   None  Antimicrobials:    Subjective: He is alert, report feeling well. Denies dyspnea. Had BM yesterday. Tolerating diet.    Objective: Vitals:   08/14/20 0100 08/14/20 0400 08/14/20 0440 08/14/20 0740  BP: 111/68 121/74 109/70   Pulse: 69  69 70  Resp: 18 19 (!) 23 12  Temp: 98.4 F (36.9 C)  98.7 F (37.1 C)   TempSrc: Oral  Oral   SpO2: 99%  94% 97%  Weight:   75.5 kg   Height:        Intake/Output Summary (Last 24 hours) at 08/14/2020 1306 Last data filed at 08/14/2020 1039 Gross per 24 hour  Intake 610 ml  Output 1750 ml  Net -1140 ml   Filed Weights   08/12/20 0417 08/13/20 0345 08/14/20 0440  Weight: 72.5 kg 71.3 kg 75.5 kg    Examination:  General exam: NAD Respiratory system:  CTA Cardiovascular system: S 1, S 2 RRR Gastrointestinal system: BS present, soft, nt Central nervous system: alert, answer questions Extremities: No edema   Data Reviewed: I have personally reviewed following labs and imaging studies  CBC: Recent Labs  Lab 08/10/20 0546 08/11/20 0033 08/12/20 0034 08/13/20 0326 08/14/20 0813  WBC 5.0 7.3 6.7 5.9 4.7  HGB 16.1 17.4* 17.1*  17.4* 16.7 16.3  HCT 50.2 57.1* 56.6*  54.5* 53.1* 52.0  MCV 96.0 100.2* 100.9* 99.6 98.5  PLT 183 194 184 156 045   Basic Metabolic Panel: Recent Labs  Lab 08/09/20 1043 08/09/20 1835 08/10/20 0546 08/10/20 1602 08/11/20 0033 08/12/20 0034 08/13/20 0326 08/13/20 1044 08/14/20 0340  NA 140  --  141  --  144 144 141 137 131*  K 3.6  --  3.3*  --  3.1* 3.5 2.6* 2.9* 4.4  CL 96*  --  101  --  96* 98 93* 92* 94*  CO2 33*  --  31  --  35* 35* 38* 36* 30  GLUCOSE 80  --  140*  --  87 100* 57* 189* 92  BUN 43*  --  48*  --  40* 40* 37* 34* 31*  CREATININE 1.24  --  1.18  --  1.20 1.20 1.08 1.02 0.82  CALCIUM 9.1  --  8.5*  --  9.8 9.8 9.1 9.0 8.7*  MG 2.5* 2.2 2.3 2.6*  --   --  2.1  --   --   PHOS 2.0* 3.5 4.3 5.0*  --   --   --   --   --    GFR: Estimated Creatinine Clearance: 89.5 mL/min (by C-G formula based on SCr of 0.82 mg/dL). Liver Function Tests: Recent Labs  Lab 08/08/20 0321 08/09/20 1043  08/10/20 0546 08/11/20 0033 08/13/20 0326  AST 28 26 18 22 19   ALT 16 14 12 14 13   ALKPHOS 62 57 50 57 59  BILITOT 1.9* 2.5* 2.0* 1.8* 2.5*  PROT 7.4 7.4 6.9 7.7 7.2  ALBUMIN 3.1* 3.0* 2.8* 3.1* 2.9*   No results for input(s): LIPASE, AMYLASE in the last 168 hours. Recent Labs  Lab 08/08/20 0321 08/12/20 0853  AMMONIA 30 30   Coagulation Profile: Recent Labs  Lab 08/08/20 1452  INR 1.1   Cardiac Enzymes: No results for input(s): CKTOTAL, CKMB, CKMBINDEX, TROPONINI in the last 168 hours. BNP (last 3 results) No results for input(s): PROBNP in the last 8760 hours. HbA1C:  No results for input(s): HGBA1C in the last 72 hours. CBG: Recent Labs  Lab 08/13/20 2005 08/14/20 0058 08/14/20 0420 08/14/20 0836 08/14/20 1144  GLUCAP 143* 134* 104* 160* 102*   Lipid Profile: No results for input(s): CHOL, HDL, LDLCALC, TRIG, CHOLHDL, LDLDIRECT in the last 72 hours. Thyroid Function Tests: No results for input(s): TSH, T4TOTAL, FREET4, T3FREE, THYROIDAB in the last 72 hours. Anemia Panel: No results for input(s): VITAMINB12, FOLATE, FERRITIN, TIBC, IRON, RETICCTPCT in the last 72 hours. Sepsis Labs: Recent Labs  Lab 08/07/20 1314 08/09/20 1058  LATICACIDVEN 1.1 1.2    Recent Results (from the past 240 hour(s))  SARS CORONAVIRUS 2 (TAT 6-24 HRS) Nasopharyngeal Nasopharyngeal Swab     Status: None   Collection Time: 08/05/20  1:03 AM   Specimen: Nasopharyngeal Swab  Result Value Ref Range Status   SARS Coronavirus 2 NEGATIVE NEGATIVE Final    Comment: (NOTE) SARS-CoV-2 target nucleic acids are NOT DETECTED.  The SARS-CoV-2 RNA is generally detectable in upper and lower respiratory specimens during the acute phase of infection. Negative results do not preclude SARS-CoV-2 infection, do not rule out co-infections with other pathogens, and should not be used as the sole basis for treatment or other patient management decisions. Negative results must be combined with  clinical observations, patient history, and epidemiological information. The expected result is Negative.  Fact Sheet for Patients: SugarRoll.be  Fact Sheet for Healthcare Providers: https://www.woods-mathews.com/  This test is not yet approved or cleared by the [place] FDA and  has been authorized for detection and/or diagnosis of SARS-CoV-2 by FDA under an Emergency Use Authorization (EUA). This EUA will remain  in effect (meaning this test can be used) for the duration of the COVID-19 declaration under Se ction 564(b)(1) of the Act, 21 U.S.C. section 360bbb-3(b)(1), unless the authorization is terminated or revoked sooner.  Performed at [facility], [place] [address], [place], [place] [address]   MRSA PCR Screening     Status: None   Collection Time: 08/08/20  6:39 PM   Specimen: Nasal Mucosa; Nasopharyngeal  Result Value Ref Range Status   MRSA by PCR NEGATIVE NEGATIVE Final    Comment:        The GeneXpert MRSA Assay (FDA approved for NASAL specimens only), is one component of a comprehensive MRSA colonization surveillance program. It is not intended to diagnose MRSA infection nor to guide or monitor treatment for MRSA infections. Performed at [facility], [place] [address] [place],  [address]          Radiology Studies: DG CHEST PORT 1 VIEW  Result Date: 08/13/2020 CLINICAL DATA:  Shortness of breath. EXAM: PORTABLE CHEST 1 VIEW COMPARISON:  Chest x-ray 08/11/2020, 08/08/2020. FINDINGS: Cardiomegaly with pulmonary venous congestion. Mild interstitial prominence again noted with interim improvement from prior exam. Findings suggest improving interstitial edema. No pleural effusion or pneumothorax. Degenerative changes scoliosis thoracic spine. Carotid vascular calcification. IMPRESSION: 1. Cardiomegaly with pulmonary venous congestion. Mild interstitial prominence again noted with interim improvement  from prior exam. Findings suggest improving interstitial edema. 2.  Carotid vascular disease. Electronically Signed   By: Fanshawe   On: 08/13/2020 12:47        Scheduled Meds: . apixaban  5 mg Oral BID  . arformoterol  15 mcg Nebulization BID  . budesonide (PULMICORT) nebulizer solution  0.5 mg Nebulization BID  . chlorhexidine gluconate (MEDLINE KIT)  15 mL Mouth Rinse BID  . Chlorhexidine Gluconate Cloth  6  each Topical Daily  . feeding supplement  237 mL Oral BID BM  . insulin aspart  2-6 Units Subcutaneous Q4H  . lactulose  20 g Oral BID  . losartan  100 mg Oral Daily  . mouth rinse  15 mL Mouth Rinse 10 times per day  . multivitamin with minerals  1 tablet Oral Daily  . pantoprazole (PROTONIX) IV  40 mg Intravenous QHS  . potassium chloride  40 mEq Oral Daily  . revefenacin  175 mcg Nebulization Daily  . sodium chloride flush  3 mL Intravenous Q12H  . torsemide  40 mg Oral Daily   Continuous Infusions: . sodium chloride       LOS: 10 days    Time spent: 35 minutes.     Elmarie Shiley, MD [facility]   If 7PM-7AM, please contact night-coverage [email or web address]  08/14/2020, 1:06 PM

## 2020-08-14 NOTE — Progress Notes (Addendum)
Cardiology Progress Note  Patient ID: Mitchell King MRN: [id number] DOB: 02/20/1951 Date of Encounter: 08/14/2020  Primary Cardiologist: No primary care provider on file.  Subjective   Did not receive diuresis yesterday due to hypokalemia but still with nearly 2 L urine output.  Potassium improved to 4.4 this morning.  Creatinine stable, 0.8.  BP 109/70.  Denies any chest pain or dyspnea.   Inpatient Medications  Scheduled Meds: . (feeding supplement) PROSource Plus  30 mL Oral TID BM  . apixaban  5 mg Oral BID  . arformoterol  15 mcg Nebulization BID  . budesonide (PULMICORT) nebulizer solution  0.5 mg Nebulization BID  . chlorhexidine gluconate (MEDLINE KIT)  15 mL Mouth Rinse BID  . Chlorhexidine Gluconate Cloth  6 each Topical Daily  . feeding supplement  1 Container Oral TID BM  . insulin aspart  2-6 Units Subcutaneous Q4H  . lactulose  20 g Oral BID  . losartan  100 mg Oral Daily  . mouth rinse  15 mL Mouth Rinse 10 times per day  . multivitamin with minerals  1 tablet Oral Daily  . pantoprazole (PROTONIX) IV  40 mg Intravenous QHS  . potassium chloride  40 mEq Oral Daily  . revefenacin  175 mcg Nebulization Daily  . sodium chloride flush  3 mL Intravenous Q12H  . torsemide  40 mg Oral Daily   Continuous Infusions: . sodium chloride     PRN Meds: sodium chloride, acetaminophen, bisacodyl, hydrALAZINE, ipratropium-albuterol, ondansetron (ZOFRAN) IV, sodium chloride flush   Vital Signs   Vitals:   08/14/20 0100 08/14/20 0400 08/14/20 0440 08/14/20 0740  BP: 111/68 121/74 109/70   Pulse: 69  69 70  Resp: 18 19 (!) 23 12  Temp: 98.4 F (36.9 C)  98.7 F (37.1 C)   TempSrc: Oral  Oral   SpO2: 99%  94% 97%  Weight:   75.5 kg   Height:        Intake/Output Summary (Last 24 hours) at 08/14/2020 1012 Last data filed at 08/14/2020 0600 Gross per 24 hour  Intake 610 ml  Output 1950 ml  Net -1340 ml   Last 3 Weights 08/14/2020 08/13/2020 08/12/2020  Weight (lbs)  166 lb 7.2 oz 157 lb 3 oz 159 lb 13.3 oz  Weight (kg) 75.5 kg 71.3 kg 72.5 kg      Telemetry  Normal sinus rhythm in 60s, PVCs which I personally reviewed.   Physical Exam   Vitals:   08/14/20 0100 08/14/20 0400 08/14/20 0440 08/14/20 0740  BP: 111/68 121/74 109/70   Pulse: 69  69 70  Resp: 18 19 (!) 23 12  Temp: 98.4 F (36.9 C)  98.7 F (37.1 C)   TempSrc: Oral  Oral   SpO2: 99%  94% 97%  Weight:   75.5 kg   Height:         Intake/Output Summary (Last 24 hours) at 08/14/2020 1012 Last data filed at 08/14/2020 0600 Gross per 24 hour  Intake 610 ml  Output 1950 ml  Net -1340 ml    Last 3 Weights 08/14/2020 08/13/2020 08/12/2020  Weight (lbs) 166 lb 7.2 oz 157 lb 3 oz 159 lb 13.3 oz  Weight (kg) 75.5 kg 71.3 kg 72.5 kg    Body mass index is 22.57 kg/m.   General: in no acute distress Neck: +JVD Cardiac: Normal S1, S2; RRR; no murmurs Lungs: CTA Abd: Mild distention Ext: No  edema Musculoskeletal: No deformities Skin: Warm and dry, no  rashes   Neuro: Oriented to person and place  Labs  High Sensitivity Troponin:   Recent Labs  Lab 08/04/20 2003  TROPONINIHS 44*     Cardiac EnzymesNo results for input(s): TROPONINI in the last 168 hours. No results for input(s): TROPIPOC in the last 168 hours.  Chemistry Recent Labs  Lab 08/10/20 0546 08/11/20 0033 08/12/20 0034 08/13/20 0326 08/13/20 1044 08/14/20 0340  NA 141 144   < > 141 137 131*  K 3.3* 3.1*   < > 2.6* 2.9* 4.4  CL 101 96*   < > 93* 92* 94*  CO2 31 35*   < > 38* 36* 30  GLUCOSE 140* 87   < > 57* 189* 92  BUN 48* 40*   < > 37* 34* 31*  CREATININE 1.18 1.20   < > 1.08 1.02 0.82  CALCIUM 8.5* 9.8   < > 9.1 9.0 8.7*  PROT 6.9 7.7  --  7.2  --   --   ALBUMIN 2.8* 3.1*  --  2.9*  --   --   AST 18 22  --  19  --   --   ALT 12 14  --  13  --   --   ALKPHOS 50 57  --  59  --   --   BILITOT 2.0* 1.8*  --  2.5*  --   --   GFRNONAA >60 >60   < > >60 >60 >60  ANIONGAP 9 13   < > 10 9 7    < > = values in  this interval not displayed.    Hematology Recent Labs  Lab 08/12/20 0034 08/13/20 0326 08/14/20 0813  WBC 6.7 5.9 4.7  RBC 5.61 5.33 5.28  HGB 17.1*  17.4* 16.7 16.3  HCT 56.6*  54.5* 53.1* 52.0  MCV 100.9* 99.6 98.5  MCH 30.5 31.3 30.9  MCHC 30.2 31.5 31.3  RDW 13.9 13.7 13.2  PLT 184 156 154   BNP Recent Labs  Lab 08/08/20 1452 08/13/20 0326  BNP 781.2* 604.3*    DDimer  No results for input(s): DDIMER in the last 168 hours.   Radiology  DG CHEST PORT 1 VIEW  Result Date: 08/13/2020 CLINICAL DATA:  Shortness of breath. EXAM: PORTABLE CHEST 1 VIEW COMPARISON:  Chest x-ray 08/11/2020, 08/08/2020. FINDINGS: Cardiomegaly with pulmonary venous congestion. Mild interstitial prominence again noted with interim improvement from prior exam. Findings suggest improving interstitial edema. No pleural effusion or pneumothorax. Degenerative changes scoliosis thoracic spine. Carotid vascular calcification. IMPRESSION: 1. Cardiomegaly with pulmonary venous congestion. Mild interstitial prominence again noted with interim improvement from prior exam. Findings suggest improving interstitial edema. 2.  Carotid vascular disease. Electronically Signed   By: Marcello Moores  Register   On: 08/13/2020 12:47    Cardiac Studies  TTE 08/05/2020 1. Left ventricular ejection fraction, by estimation, is 55 to 60%. The  left ventricle has normal function. The left ventricle has no regional  wall motion abnormalities. There is mild concentric left ventricular  hypertrophy. Left ventricular diastolic  parameters were normal. There is the interventricular septum is flattened  in systole, consistent with right ventricular pressure overload.  2. Right ventricular systolic function is normal. The right ventricular  size is severely enlarged. Moderately increased right ventricular wall  thickness. There is mildly elevated pulmonary artery systolic pressure.  The estimated right ventricular  systolic pressure  is 01.0 mmHg (suspect this is an underestimation due to  the faint TR jet).  3. Right atrial  size was severely dilated.  4. The mitral valve is normal in structure. No evidence of mitral valve  regurgitation. No evidence of mitral stenosis.  5. The aortic valve is tricuspid. Aortic valve regurgitation is not  visualized. No aortic stenosis is present.  6. The inferior vena cava is dilated in size with <50% respiratory  variability, suggesting right atrial pressure of 15 mmHg.   CT PE Study  1. Several subsegmental pulmonary emboli in the pulmonary arteries supplying the bilateral upper lobes and the left lower lobe. 2. Moderate bilateral pleural effusions, right greater than left, with associated atelectasis. 3. Aspirated debris and bronchial wall thickening in the right lower lobe likely reflect chronic aspiration. 4. Enlarged pulmonary artery likely representing pulmonary hypertension. Mild cardiomegaly. Patient Profile  Mitchell King is a 69 y.o. male with history of hypertension, COPD, diabetes who was admitted on 08/04/2020 with congestive heart failure.  He is found to have severe RV enlargement with reduced function in the setting of several pulmonary emboli.  Course complicated on 4/76/5465 by altered mentation/encephalopathy.  Assessment & Plan   Right heart failure/cor pulmonale: Admitted with 2-week history of shortness of breath and lower extremity edema.  Found to have multiple subsegmental PEs in several lobes.  Echocardiogram with severely dilated RV and function appears reduced.  Worsening encephalopathy 2/18, found to have hypercarbic respiratory failure requiring intubation.  Improved with diuresis, extubated 2/20.  No evidence of shock, has been normotensive, warm on exam, lactate normal -Transition to p.o. torsemide 40 mg daily today -Metabolic alkalosis has resolved, will discontinue Diamox -Continue losartan 100 mg daily -Carvedilol was held due to bradycardia  while in ICU, can add back as he improves  Acute Encephalopathy: found to have hypercapnia, required intubation 2/18.  Improved, extubated 2/20  PE: noted on CT 2/15.  R heart strain on echo.  Continue Eliquis   For questions or updates, please contact [place] Please consult [email or web address] for contact info under      Donato Heinz, MD

## 2020-08-15 DIAGNOSIS — I5033 Acute on chronic diastolic (congestive) heart failure: Secondary | ICD-10-CM | POA: Diagnosis not present

## 2020-08-15 LAB — BASIC METABOLIC PANEL
Anion gap: 10 (ref 5–15)
BUN: 34 mg/dL — ABNORMAL HIGH (ref 8–23)
CO2: 31 mmol/L (ref 22–32)
Calcium: 8.8 mg/dL — ABNORMAL LOW (ref 8.9–10.3)
Chloride: 92 mmol/L — ABNORMAL LOW (ref 98–111)
Creatinine, Ser: 1.09 mg/dL (ref 0.61–1.24)
GFR, Estimated: >60 mL/min (ref >60)
Glucose, Bld: 99 mg/dL (ref 70–99)
Potassium: 3.1 mmol/L — ABNORMAL LOW (ref 3.5–5.1)
Sodium: 133 mmol/L — ABNORMAL LOW (ref 135–145)

## 2020-08-15 LAB — CBC
HCT: 51.9 % (ref 39.0–52.0)
Hemoglobin: 16.4 g/dL (ref 13.0–17.0)
MCH: 30.4 pg (ref 26.0–34.0)
MCHC: 31.6 g/dL (ref 30.0–36.0)
MCV: 96.1 fL (ref 80.0–100.0)
Platelets: 152 10*3/uL (ref 150–400)
RBC: 5.4 MIL/uL (ref 4.22–5.81)
RDW: 13.2 % (ref 11.5–15.5)
WBC: 4.1 10*3/uL (ref 4.0–10.5)
nRBC: 0 % (ref 0.0–0.2)

## 2020-08-15 LAB — GLUCOSE, CAPILLARY
Glucose-Capillary: 101 mg/dL — ABNORMAL HIGH (ref 70–99)
Glucose-Capillary: 111 mg/dL — ABNORMAL HIGH (ref 70–99)
Glucose-Capillary: 143 mg/dL — ABNORMAL HIGH (ref 70–99)
Glucose-Capillary: 172 mg/dL — ABNORMAL HIGH (ref 70–99)
Glucose-Capillary: 178 mg/dL — ABNORMAL HIGH (ref 70–99)
Glucose-Capillary: 97 mg/dL (ref 70–99)
Glucose-Capillary: 98 mg/dL (ref 70–99)

## 2020-08-15 MED ORDER — POTASSIUM CHLORIDE CRYS ER 20 MEQ PO TBCR
40.0000 meq | EXTENDED_RELEASE_TABLET | Freq: Once | ORAL | Status: AC
  Administered 2020-08-15: 40 meq via ORAL
  Filled 2020-08-15: qty 2

## 2020-08-15 MED ORDER — TORSEMIDE 20 MG PO TABS
20.0000 mg | ORAL_TABLET | Freq: Every day | ORAL | Status: DC
  Administered 2020-08-15 – 2020-08-16 (×2): 20 mg via ORAL
  Filled 2020-08-15: qty 1

## 2020-08-15 MED ORDER — SPIRONOLACTONE 12.5 MG HALF TABLET: 12.5000 mg | ORAL_TABLET | Freq: Every day | ORAL | Status: DC

## 2020-08-15 MED ORDER — TAMSULOSIN HCL 0.4 MG PO CAPS
0.4000 mg | ORAL_CAPSULE | Freq: Every day | ORAL | Status: DC
  Administered 2020-08-15 – 2020-08-16 (×2): 0.4 mg via ORAL
  Filled 2020-08-15 (×2): qty 1

## 2020-08-15 MED ORDER — SPIRONOLACTONE 25 MG PO TABS
25.0000 mg | ORAL_TABLET | Freq: Every day | ORAL | Status: DC
  Administered 2020-08-16: 25 mg via ORAL
  Filled 2020-08-15: qty 1

## 2020-08-15 NOTE — Progress Notes (Signed)
PT Cancellation Note  Patient Details Name: Mitchell King MRN: [id number] DOB: Feb 17, 1951   Cancelled Treatment:    Reason Eval/Treat Not Completed: Medical issues which prohibited therapy. PT entered room and pt found to have bloody sputum on gown. RN notified.  Ginette Otto, DPT Acute Rehabilitation Services [phone]   [name] 08/15/2020, 9:54 AM

## 2020-08-15 NOTE — TOC Progression Note (Signed)
Transition of Care [facility]) - Progression Note    Patient Details  Name: Mitchell King MRN: [id number] Date of Birth: 01-16-1951  Transition [facility]) CM/SW Contact  Erin Sons, [place] Phone Number: 08/15/2020, 2:12 PM  Clinical Narrative:     Pt is not stable for d/c today. [employer]. They can accept pt tomorrow; morning d/c is requested. Covid test good for 48 hours and was collected on 08/14/20   Expected Discharge Plan: Skilled Nursing Facility Barriers to Discharge: Continued Medical Work up  Expected Discharge Plan and Services Expected Discharge Plan: Skilled Nursing Facility       Living arrangements for the past 2 months: Single Family Home                                       Social Determinants of Health (SDOH) Interventions    Readmission Risk Interventions No flowsheet data found.

## 2020-08-15 NOTE — Care Management Important Message (Signed)
Important Message  Patient Details  Name: Mitchell King MRN: [id number] Date of Birth: 1950-10-29   Medicare Important Message Given:  Yes     Renie Ora 08/15/2020, 11:22 AM

## 2020-08-15 NOTE — Progress Notes (Signed)
Bladder scan done = greater than 676, foley placed.

## 2020-08-15 NOTE — Progress Notes (Signed)
Physical Therapy Treatment Patient Details Name: Mitchell King MRN: [id number] DOB: [birth year] Today's Date: 08/15/2020    History of Present Illness Mitchell King is a 70 y.o. male with PMH significant of diet-controlled diabetes, COPD, HTN, previous alcohol abuse, who has not seen doctors in years and has not been taking his medications for a while except for inhalers. Presents with progressive SOB, cough, PND and orthopnea as well as LE edema over the last 2 weeks. Found to have acute exacerbation of CHF secondary to HTN heart disease, acute PEs, acute respiratory failure and metabolic encephalopathy.    PT Comments    Pt continuing to progress with functional mobility. Pt able to perform transfers and ambulation with decreased assist. Pt with intermittent LOB during ambulation requiring min A to regain balance. Pt with limited endurance and only able to tolerate 1 trial of ambulation prior to requiring increased seated rest break and deferring any other activity. Pt will continue to benefit from skilled PT to address deficits in balance, strength, coordination, gait and endurance to maximize independence with functional mobility prior to discharge.     Follow Up Recommendations  SNF;Supervision for mobility/OOB     Equipment Recommendations  Other (comment) (defer)    Recommendations for Other Services       Precautions / Restrictions Precautions Precautions: Fall Precaution Comments: monitor O2 sats Restrictions Weight Bearing Restrictions: No    Mobility  Bed Mobility Overal bed mobility: Needs Assistance Bed Mobility: Supine to Sit     Supine to sit: Min assist          Transfers Overall transfer level: Needs assistance Equipment used: Rolling walker (2 wheeled) Transfers: Sit to/from Stand Sit to Stand: Min assist            Ambulation/Gait Ambulation/Gait assistance: Min assist;+2 safety/equipment Gait Distance (Feet): 65 Feet Assistive device:  Rolling walker (2 wheeled)       General Gait Details: increased lateral weight shift R initially requiring min A to regain balance. improved step length and heigh noted   Stairs             Wheelchair Mobility    Modified Rankin (Stroke Patients Only)       Balance Overall balance assessment: Needs assistance Sitting-balance support: Feet supported Sitting balance-Leahy Scale: Fair     Standing balance support: Bilateral upper extremity supported Standing balance-Leahy Scale: Poor                              Cognition                                              Exercises      General Comments General comments (skin integrity, edema, etc.): VSS; RN cleared PT for short session following MD check following bloody sputum      Pertinent Vitals/Pain Pain Assessment: No/denies pain    Home Living                      Prior Function            PT Goals (current goals can now be found in the care plan section) Acute Rehab PT Goals Patient Stated Goal: I am feeling better, I hope to go to rehab and get stronger PT Goal Formulation: With  patient Time For Goal Achievement: 08/22/20 Potential to Achieve Goals: Good Progress towards PT goals: Progressing toward goals    Frequency    Min 2X/week      PT Plan Current plan remains appropriate    Co-evaluation              AM-PAC PT "6 Clicks" Mobility   Outcome Measure  Help needed turning from your back to your side while in a flat bed without using bedrails?: A Little Help needed moving from lying on your back to sitting on the side of a flat bed without using bedrails?: A Little Help needed moving to and from a bed to a chair (including a wheelchair)?: A Little Help needed standing up from a chair using your arms (e.g., wheelchair or bedside chair)?: A Little Help needed to walk in hospital room?: A Little Help needed climbing 3-5 steps with a railing?  : A Lot 6 Click Score: 17    End of Session Equipment Utilized During Treatment: Gait belt;Oxygen Activity Tolerance: Patient tolerated treatment well Patient left: in chair;with call bell/phone within reach;with chair alarm set Nurse Communication: Mobility status PT Visit Diagnosis: Muscle weakness (generalized) (M62.81);Unsteadiness on feet (R26.81);Difficulty in walking, not elsewhere classified (R26.2)     Time: 3291-9166 PT Time Calculation (min) (ACUTE ONLY): 10 min  Charges:  $Therapeutic Activity: 8-22 mins                     Ginette Otto, DPT Acute Rehabilitation Services [phone]   [name] 08/15/2020, 11:56 AM

## 2020-08-15 NOTE — Progress Notes (Signed)
Cardiology Progress Note  Patient ID: MCCORMICK MACON MRN: [id number] DOB: Aug 08, 1950 Date of Encounter: 08/15/2020  Primary Cardiologist: No primary care provider on file.  Subjective   Started on p.o. torsemide yesterday, net -1.5 L, he is -27 L on admission.  Creatinine 1.1 today.  Denies any dyspnea or chest pain.   Inpatient Medications  Scheduled Meds: . apixaban  5 mg Oral BID  . arformoterol  15 mcg Nebulization BID  . budesonide (PULMICORT) nebulizer solution  0.5 mg Nebulization BID  . chlorhexidine gluconate (MEDLINE KIT)  15 mL Mouth Rinse BID  . Chlorhexidine Gluconate Cloth  6 each Topical Daily  . feeding supplement  237 mL Oral BID BM  . insulin aspart  2-6 Units Subcutaneous Q4H  . lactulose  20 g Oral BID  . losartan  100 mg Oral Daily  . mouth rinse  15 mL Mouth Rinse 10 times per day  . multivitamin with minerals  1 tablet Oral Daily  . pantoprazole (PROTONIX) IV  40 mg Intravenous QHS  . potassium chloride  40 mEq Oral Daily  . revefenacin  175 mcg Nebulization Daily  . sodium chloride flush  3 mL Intravenous Q12H  . tamsulosin  0.4 mg Oral Daily  . torsemide  40 mg Oral Daily   Continuous Infusions: . sodium chloride     PRN Meds: sodium chloride, acetaminophen, bisacodyl, hydrALAZINE, ipratropium-albuterol, ondansetron (ZOFRAN) IV, sodium chloride flush   Vital Signs   Vitals:   08/15/20 0050 08/15/20 0254 08/15/20 0738 08/15/20 0822  BP:  107/73 114/72   Pulse:   78 76  Resp:  15 13 (!) 22  Temp:  98.1 F (36.7 C) 98.5 F (36.9 C)   TempSrc:   Oral   SpO2:  96% 96% 95%  Weight: 74.7 kg 74.8 kg    Height:        Intake/Output Summary (Last 24 hours) at 08/15/2020 0940 Last data filed at 08/15/2020 0741 Gross per 24 hour  Intake 940 ml  Output 2900 ml  Net -1960 ml   Last 3 Weights 08/15/2020 08/15/2020 08/14/2020  Weight (lbs) 164 lb 14.5 oz 164 lb 10.9 oz 166 lb 7.2 oz  Weight (kg) 74.8 kg 74.7 kg 75.5 kg      Telemetry  Normal  sinus rhythm in 70s, PVCs which I personally reviewed.   Physical Exam   Vitals:   08/15/20 0050 08/15/20 0254 08/15/20 0738 08/15/20 0822  BP:  107/73 114/72   Pulse:   78 76  Resp:  15 13 (!) 22  Temp:  98.1 F (36.7 C) 98.5 F (36.9 C)   TempSrc:   Oral   SpO2:  96% 96% 95%  Weight: 74.7 kg 74.8 kg    Height:         Intake/Output Summary (Last 24 hours) at 08/15/2020 0940 Last data filed at 08/15/2020 0741 Gross per 24 hour  Intake 940 ml  Output 2900 ml  Net -1960 ml    Last 3 Weights 08/15/2020 08/15/2020 08/14/2020  Weight (lbs) 164 lb 14.5 oz 164 lb 10.9 oz 166 lb 7.2 oz  Weight (kg) 74.8 kg 74.7 kg 75.5 kg    Body mass index is 22.37 kg/m.   General: in no acute distress Neck: No JVD Cardiac: Normal S1, S2; RRR; no murmurs Lungs: CTA Abd: Soft Ext: No  edema Musculoskeletal: No deformities Skin: Warm and dry, no rashes   Neuro: Oriented to person and place  Labs  High Sensitivity  Troponin:   Recent Labs  Lab 08/04/20 2003  TROPONINIHS 44*     Cardiac EnzymesNo results for input(s): TROPONINI in the last 168 hours. No results for input(s): TROPIPOC in the last 168 hours.  Chemistry Recent Labs  Lab 08/10/20 0546 08/11/20 0033 08/12/20 0034 08/13/20 0326 08/13/20 1044 08/14/20 0340 08/15/20 0255  NA 141 144   < > 141 137 131* 133*  K 3.3* 3.1*   < > 2.6* 2.9* 4.4 3.1*  CL 101 96*   < > 93* 92* 94* 92*  CO2 31 35*   < > 38* 36* 30 31  GLUCOSE 140* 87   < > 57* 189* 92 99  BUN 48* 40*   < > 37* 34* 31* 34*  CREATININE 1.18 1.20   < > 1.08 1.02 0.82 1.09  CALCIUM 8.5* 9.8   < > 9.1 9.0 8.7* 8.8*  PROT 6.9 7.7  --  7.2  --   --   --   ALBUMIN 2.8* 3.1*  --  2.9*  --   --   --   AST 18 22  --  19  --   --   --   ALT 12 14  --  13  --   --   --   ALKPHOS 50 57  --  59  --   --   --   BILITOT 2.0* 1.8*  --  2.5*  --   --   --   GFRNONAA >60 >60   < > >60 >60 >60 >60  ANIONGAP 9 13   < > _0 < > = values in this interval not displayed.     Hematology Recent Labs  Lab 08/12/20 0034 08/13/20 0326 08/14/20 0813  WBC 6.7 5.9 4.7  RBC 5.61 5.33 5.28  HGB 17.1*  17.4* 16.7 16.3  HCT 56.6*  54.5* 53.1* 52.0  MCV 100.9* 99.6 98.5  MCH 30.5 31.3 30.9  MCHC 30.2 31.5 31.3  RDW 13.9 13.7 13.2  PLT 184 156 154   BNP Recent Labs  Lab 08/08/20 1452 08/13/20 0326  BNP 781.2* 604.3*    DDimer  No results for input(s): DDIMER in the last 168 hours.   Radiology  DG CHEST PORT 1 VIEW  Result Date: 08/13/2020 CLINICAL DATA:  Shortness of breath. EXAM: PORTABLE CHEST 1 VIEW COMPARISON:  Chest x-ray 08/11/2020, 08/08/2020. FINDINGS: Cardiomegaly with pulmonary venous congestion. Mild interstitial prominence again noted with interim improvement from prior exam. Findings suggest improving interstitial edema. No pleural effusion or pneumothorax. Degenerative changes scoliosis thoracic spine. Carotid vascular calcification. IMPRESSION: 1. Cardiomegaly with pulmonary venous congestion. Mild interstitial prominence again noted with interim improvement from prior exam. Findings suggest improving interstitial edema. 2.  Carotid vascular disease. Electronically Signed   By: Marcello Moores  Register   On: 08/13/2020 12:47    Cardiac Studies  TTE 08/05/2020 1. Left ventricular ejection fraction, by estimation, is 55 to 60%. The  left ventricle has normal function. The left ventricle has no regional  wall motion abnormalities. There is mild concentric left ventricular  hypertrophy. Left ventricular diastolic  parameters were normal. There is the interventricular septum is flattened  in systole, consistent with right ventricular pressure overload.  2. Right ventricular systolic function is normal. The right ventricular  size is severely enlarged. Moderately increased right ventricular wall  thickness. There is mildly elevated pulmonary artery systolic pressure.  The estimated right ventricular  systolic pressure is 66.4 mmHg (suspect  this is an  underestimation due to  the faint TR jet).  3. Right atrial size was severely dilated.  4. The mitral valve is normal in structure. No evidence of mitral valve  regurgitation. No evidence of mitral stenosis.  5. The aortic valve is tricuspid. Aortic valve regurgitation is not  visualized. No aortic stenosis is present.  6. The inferior vena cava is dilated in size with <50% respiratory  variability, suggesting right atrial pressure of 15 mmHg.   CT PE Study  1. Several subsegmental pulmonary emboli in the pulmonary arteries supplying the bilateral upper lobes and the left lower lobe. 2. Moderate bilateral pleural effusions, right greater than left, with associated atelectasis. 3. Aspirated debris and bronchial wall thickening in the right lower lobe likely reflect chronic aspiration. 4. Enlarged pulmonary artery likely representing pulmonary hypertension. Mild cardiomegaly. Patient Profile  CHAUNCEY SCIULLI is a 70 y.o. male with history of hypertension, COPD, diabetes who was admitted on 08/04/2020 with congestive heart failure.  He is found to have severe RV enlargement with reduced function in the setting of several pulmonary emboli.  Course complicated on 7/84/7841 by altered mentation/encephalopathy.  Assessment & Plan   Right heart failure/cor pulmonale: Admitted with 2-week history of shortness of breath and lower extremity edema.  Found to have multiple subsegmental PEs in several lobes.  Echocardiogram with severely dilated RV and function appears reduced.  Worsening encephalopathy 2/18, found to have hypercarbic respiratory failure requiring intubation.  Improved with diuresis, extubated 2/20.   -Continue p.o. torsemide -Metabolic alkalosis has resolved, will discontinue Diamox -Continue losartan 100 mg daily -Carvedilol was held due to bradycardia while in ICU.  Bradycardia resolved, can add back as he recovers -Has had issues with hypokalemia limiting diuresis, will add  spironolactone 25 mg daily   Acute Encephalopathy: found to have hypercapnia, required intubation 2/18.  Improved, extubated 2/20  PE: noted on CT 2/15.  R heart strain on echo.  Continue Eliquis   For questions or updates, please contact [place] Please consult [email or web address] for contact info under      Donato Heinz, MD

## 2020-08-15 NOTE — Progress Notes (Signed)
Heart Failure Stewardship Pharmacist Progress Note   PCP: Patient, No Pcp Per PCP-Cardiologist: No primary care provider on file.    HPI:  70 yo M with PMH of T2DM, COPD, HTN, prior alcohol abuse, and medication noncompliance. He presented to the ED On 08/04/20 with progressive shortness of breath, DOE, PND, and orthopnea over the last two weeks. He was admitted for acute CHF. An ECHO was done on 08/05/20 and LVEF is 55-60%. Also found to have several scattered small PE's. He was transferred to the ICU for hypercapnic respiratory failure, was intubated, then extubated over the weekend after being aggressively diuresed. He has been transferred back to the floor.   Current HF Medications: Torsemide 40 mg PO daily Losartan 100 mg daily Spironolactone 25 mg daily  Prior to admission HF Medications: None  Pertinent Lab Values: . Serum creatinine 1.09, BUN 34, Potassium 3.1, Sodium 133, BNP 690.9  Vital Signs: . Weight: 164 lbs (admission weight: 180 lbs) . Blood pressure: 110/70s . Heart rate: 70s   Medication Assistance / Insurance Benefits Check: Does the patient have prescription insurance?  Yes Type of insurance plan: [employer]  Outpatient Pharmacy:  Prior to admission outpatient pharmacy: [facility] Is the patient willing to use [facility] TOC pharmacy at discharge? Yes   Assessment: 1. Acute diastolic CHF (EF 81-44%). NYHA class III symptoms.  - Continue torsemide 40 mg PO daily - Off carvedilol due to bradycardia in the ICU. Would hold off restarting until pt more stable overall.  - Continue losartan 100 mg daily. Could consider optimizing further to [facility] given newer indication for LVEF <57% when able - Agree with starting spironolactone 25 mg daily - Consider starting Jardiance 10 mg daily prior to discharge. [place] Pepper is the SGLT2i approved on the [place] formulary   Plan:  1) Medication changes recommended at this time: - Continue current regimen; add Jardiance tomorrow if SCr  stable  2) Patient assistance: - Patient would benefit from utilizing [place] pharmacy for chronic medications as they would be free for him (Eliquis is on the [place] formulary for treatment of VTE)  3)  Education  - To be completed prior to discharge  Sharen Hones, PharmD, BCPS Heart Failure Stewardship Pharmacist Phone [phone]

## 2020-08-15 NOTE — Plan of Care (Signed)
  Problem: Clinical Measurements: Goal: Respiratory complications will improve Outcome: Progressing   Problem: Activity: Goal: Risk for activity intolerance will decrease Outcome: Progressing   

## 2020-08-15 NOTE — Progress Notes (Addendum)
PROGRESS NOTE    Mitchell King  EEF:[id number] DOB: 1951-01-22 DOA: 08/04/2020 PCP: Patient, No Pcp Per   Brief Narrative: 70 year old with past medical history significant for ongoing tobacco abuse, prior alcohol assessment, diabetes type 2, who was admitted with acute hypoxic respiratory failure, right heart failure/cor pulmonale and bilateral PE, treated with diuretics and anticoagulation.  He developed progressive encephalopathy, was noted to have elevated ammonia level and a started on lactulose.  On 12/18 he developed worsening encephalopathy secondary to hypercarbia and hyperammonemia, was intubated and transferred to the ICU, hospitalization was complicated by ileus as well requiring NG decompression. -Patient mental status has improved an ileus as well.  He was extubated 12/20 -Transfer to [facility] service on 12/22.    Assessment & Plan:   Principal Problem:   Acute exacerbation of CHF (congestive heart failure) (HCC) Active Problems:   Chronic obstructive pulmonary disease (HCC)   Essential hypertension   Diabetes mellitus (HCC)   Nondependent alcohol abuse, continuous drinking behavior   Acute cor pulmonale (HCC)   Pulmonary embolism (HCC)   Right heart failure (HCC)   Acute respiratory failure (HCC)   Hyperammonemia (HCC)   Malnutrition of moderate degree  1-Acute on Chronic Hypoxic and Hypercapnic Respiratory Failure, acute right heart failure/cor pulmonale; -Secondary to CHF, bilateral PE, underlying COPD. -Patient was intubated for worsening encephalopathy on 12/18 and extubated on 12/20. -Echo with preserved ejection fraction, right ventricle is significantly enlarged. -Cardiology following, holding Lasix IV due to severe hypokalemia on 2/23. -Wean off oxygen as tolerated. Down to 2 L.  -Transition to oral torsemide today.   2-Acute bilateral pulmonary embolism and he has been on IV heparin for several days.  - He was transitioned to Eliquis 2/22. He was on 7 days IV  heparin.  -nurse reported patient cough small amount of blood. Plan to monitor patient overnight. Hb stable.   3-Encephalopathy:  -Multifactorial, secondary to hypercarbia, elevated ammonia, could have chronic liver disease from alcoholism and as well as heart failure. -Continue with lactulose -Alert  4-Ileus: Appears to be improving, had bowel movement. Patient pull NG tube on 2/22 Had BM yesterday. Advanced diet heart healthy 2/24. Tolerating diet.   Chronic liver diseases;  -Likely secondary to prior alcoholism and and right heart failure -Ultrasound noted abnormal echogenicity concerning for parenchymal liver disease -Bilirubin mildly elevated.  Urinary retention: Fail voiding trial.  Started on Flomax.  He needs follow up with urology  Type 2 diabetes: Continue with a sliding scale insulin  COPD: Counseled about smoking cessation  Hypokalemia; replenished.  Oral potasium supplement.   Nutrition Problem: Moderate Malnutrition Etiology: chronic illness (COPD, CHF)    Signs/Symptoms: mild fat depletion,moderate fat depletion,mild muscle depletion,moderate muscle depletion    Interventions: Ensure Enlive (each supplement provides 350kcal and 20 grams of protein),MVI  Estimated body mass index is 22.37 kg/m as calculated from the following:   Height as of this encounter: 6' (1.829 m).   Weight as of this encounter: 74.8 kg.   DVT prophylaxis: Eliquis Code Status: Full cdoe Family Communication: Son updated 2/24----2/25 Disposition Plan:  Status is: Inpatient  Remains inpatient appropriate because:IV treatments appropriate due to intensity of illness or inability to take PO   Dispo: The patient is from: Home              Anticipated d/c is to: SNF              Anticipated d/c date is: 1 day  Patient currently is not medically stable to d/c. plan to monitor overnight for further development of hemoptysis.    Difficult to place patient  No        Consultants:   CCM  Cardiology   Procedures:   None  Antimicrobials:    Subjective: He cough small amount of blood.  Alert, denies pain.   Objective: Vitals:   08/15/20 0738 08/15/20 0822 08/15/20 1200 08/15/20 1358  BP: 114/72  101/68   Pulse: 78 76 78   Resp: 13 (!) 22 13   Temp: 98.5 F (36.9 C)  98 F (36.7 C)   TempSrc: Oral  Oral   SpO2: 96% 95% 95% 94%  Weight:      Height:        Intake/Output Summary (Last 24 hours) at 08/15/2020 1502 Last data filed at 08/15/2020 1426 Gross per 24 hour  Intake 940 ml  Output 2925 ml  Net -1985 ml   Filed Weights   08/14/20 0440 08/15/20 0050 08/15/20 0254  Weight: 75.5 kg 74.7 kg 74.8 kg    Examination:  General exam: NAD Respiratory system:  CTA Cardiovascular system: S 1, S 2 RRR Gastrointestinal system: BS present, soft, nt Central nervous system: alert Extremities: no edema.    Data Reviewed: I have personally reviewed following labs and imaging studies  CBC: Recent Labs  Lab 08/11/20 0033 08/12/20 0034 08/13/20 0326 08/14/20 0813 08/15/20 0959  WBC 7.3 6.7 5.9 4.7 4.1  HGB 17.4* 17.1*  17.4* 16.7 16.3 16.4  HCT 57.1* 56.6*  54.5* 53.1* 52.0 51.9  MCV 100.2* 100.9* 99.6 98.5 96.1  PLT 194 184 156 154 709   Basic Metabolic Panel: Recent Labs  Lab 08/09/20 1043 08/09/20 1835 08/10/20 0546 08/10/20 1602 08/11/20 0033 08/12/20 0034 08/13/20 0326 08/13/20 1044 08/14/20 0340 08/15/20 0255  NA 140  --  141  --    < > 144 141 137 131* 133*  K 3.6  --  3.3*  --    < > 3.5 2.6* 2.9* 4.4 3.1*  CL 96*  --  101  --    < > 98 93* 92* 94* 92*  CO2 33*  --  31  --    < > 35* 38* 36* 30 31  GLUCOSE 80  --  140*  --    < > 100* 57* 189* 92 99  BUN 43*  --  48*  --    < > 40* 37* 34* 31* 34*  CREATININE 1.24  --  1.18  --    < > 1.20 1.08 1.02 0.82 1.09  CALCIUM 9.1  --  8.5*  --    < > 9.8 9.1 9.0 8.7* 8.8*  MG 2.5* 2.2 2.3 2.6*  --   --  2.1  --   --   --   PHOS 2.0* 3.5 4.3  5.0*  --   --   --   --   --   --    < > = values in this interval not displayed.   GFR: Estimated Creatinine Clearance: 66.7 mL/min (by C-G formula based on SCr of 1.09 mg/dL). Liver Function Tests: Recent Labs  Lab 08/09/20 1043 08/10/20 0546 08/11/20 0033 08/13/20 0326  AST _0 ALT _1 ALKPHOS 57 50 57 59  BILITOT 2.5* 2.0* 1.8* 2.5*  PROT 7.4 6.9 7.7 7.2  ALBUMIN 3.0* 2.8* 3.1* 2.9*   No results for input(s): LIPASE, AMYLASE in  the last 168 hours. Recent Labs  Lab 08/12/20 0853  AMMONIA 30   Coagulation Profile: No results for input(s): INR, PROTIME in the last 168 hours. Cardiac Enzymes: No results for input(s): CKTOTAL, CKMB, CKMBINDEX, TROPONINI in the last 168 hours. BNP (last 3 results) No results for input(s): PROBNP in the last 8760 hours. HbA1C: No results for input(s): HGBA1C in the last 72 hours. CBG: Recent Labs  Lab 08/14/20 2045 08/15/20 0018 08/15/20 0329 08/15/20 0739 08/15/20 1154  GLUCAP 131* 97 101* 98 178*   Lipid Profile: No results for input(s): CHOL, HDL, LDLCALC, TRIG, CHOLHDL, LDLDIRECT in the last 72 hours. Thyroid Function Tests: No results for input(s): TSH, T4TOTAL, FREET4, T3FREE, THYROIDAB in the last 72 hours. Anemia Panel: No results for input(s): VITAMINB12, FOLATE, FERRITIN, TIBC, IRON, RETICCTPCT in the last 72 hours. Sepsis Labs: Recent Labs  Lab 08/09/20 1058  LATICACIDVEN 1.2    Recent Results (from the past 240 hour(s))  MRSA PCR Screening     Status: None   Collection Time: 08/08/20  6:39 PM   Specimen: Nasal Mucosa; Nasopharyngeal  Result Value Ref Range Status   MRSA by PCR NEGATIVE NEGATIVE Final    Comment:        The GeneXpert MRSA Assay (FDA approved for NASAL specimens only), is one component of a comprehensive MRSA colonization surveillance program. It is not intended to diagnose MRSA infection nor to guide or monitor treatment for MRSA infections. Performed at [facility], [place] [address], [place], [place] [address]   SARS CORONAVIRUS 2 (TAT 6-24 HRS) Nasopharyngeal Nasopharyngeal Swab     Status: None   Collection Time: 08/14/20 11:59 AM   Specimen: Nasopharyngeal Swab  Result Value Ref Range Status   SARS Coronavirus 2 NEGATIVE NEGATIVE Final    Comment: (NOTE) SARS-CoV-2 target nucleic acids are NOT DETECTED.  The SARS-CoV-2 RNA is generally detectable in upper and lower respiratory specimens during the acute phase of infection. Negative results do not preclude SARS-CoV-2 infection, do not rule out co-infections with other pathogens, and should not be used as the sole basis for treatment or other patient management decisions. Negative results must be combined with clinical observations, patient history, and epidemiological information. The expected result is Negative.  Fact Sheet for Patients: SugarRoll.be  Fact Sheet for Healthcare Providers: https://www.woods-mathews.com/  This test is not yet approved or cleared by the [place] FDA and  has been authorized for detection and/or diagnosis of SARS-CoV-2 by FDA under an Emergency Use Authorization (EUA). This EUA will remain  in effect (meaning this test can be used) for the duration of the COVID-19 declaration under Se ction 564(b)(1) of the Act, 21 U.S.C. section 360bbb-3(b)(1), unless the authorization is terminated or revoked sooner.  Performed at [facility], [place] [address] [place], [place] [address]          Radiology Studies: No results found.      Scheduled Meds: . apixaban  5 mg Oral BID  . arformoterol  15 mcg Nebulization BID  . budesonide (PULMICORT) nebulizer solution  0.5 mg Nebulization BID  . chlorhexidine gluconate (MEDLINE KIT)  15 mL Mouth Rinse BID  . Chlorhexidine Gluconate Cloth  6 each Topical Daily  . feeding supplement  237 mL Oral BID BM  . insulin aspart  2-6 Units Subcutaneous Q4H  .  lactulose  20 g Oral BID  . losartan  100 mg Oral Daily  . mouth rinse  15 mL Mouth Rinse  10 times per day  . multivitamin with minerals  1 tablet Oral Daily  . pantoprazole (PROTONIX) IV  40 mg Intravenous QHS  . potassium chloride  40 mEq Oral Daily  . revefenacin  175 mcg Nebulization Daily  . sodium chloride flush  3 mL Intravenous Q12H  . spironolactone  25 mg Oral Daily  . tamsulosin  0.4 mg Oral Daily  . torsemide  20 mg Oral Daily   Continuous Infusions: . sodium chloride       LOS: 11 days    Time spent: 35 minutes.     Elmarie Shiley, MD [facility]   If 7PM-7AM, please contact night-coverage [email or web address]  08/15/2020, 3:02 PM

## 2020-08-15 NOTE — Progress Notes (Signed)
Occupational Therapy Treatment Patient Details Name: Mitchell King MRN: [id number] DOB: 07/06/1950 Today's Date: 08/15/2020    History of present illness NAVY BELAY is a 70 y.o. male with PMH significant of diet-controlled diabetes, COPD, HTN, previous alcohol abuse, who has not seen doctors in years and has not been taking his medications for a while except for inhalers. Presents with progressive SOB, cough, PND and orthopnea as well as LE edema over the last 2 weeks. Found to have acute exacerbation of CHF secondary to HTN heart disease, acute PEs, acute respiratory failure and metabolic encephalopathy.   OT comments  Patient with significant progress form prior session.  Patient is much more alert and conversational from prior session.  He was able to sit in the recliner and eat with minimal setup, transition to [facility] with Mod A and RW of one.  He did need Mod A for hygiene, but static standing balance at RW was fair.  Patient returned to supine with Min to Mod A for sit to supine.  Barriers are listed below, SNF recommended for post acute, and OT will continue in the acute setting to maximize functional status.    Follow Up Recommendations  SNF;Supervision/Assistance - 24 hour    Equipment Recommendations  Other (comment)    Recommendations for Other Services      Precautions / Restrictions Precautions Precautions: Fall Precaution Comments: monitor O2 sats Restrictions Weight Bearing Restrictions: No       Mobility Bed Mobility Overal bed mobility: Needs Assistance Bed Mobility: Supine to Sit     Supine to sit: Min assist Sit to supine: Min assist     Patient Response: Cooperative  Transfers Overall transfer level: Needs assistance Equipment used: Rolling walker (2 wheeled) Transfers: Sit to/from [employer] Sit to Stand: Min assist Stand pivot transfers: Min assist;Mod assist            Balance Overall balance assessment: Needs  assistance Sitting-balance support: Feet supported Sitting balance-Leahy Scale: Fair     Standing balance support: Bilateral upper extremity supported Standing balance-Leahy Scale: Poor Standing balance comment: reliant on BUE support                           ADL either performed or assessed with clinical judgement   ADL Overall ADL's : Needs assistance/impaired Eating/Feeding: Set up;Sitting   Grooming: Wash/dry hands;Wash/dry face;Set up;Sitting                   Toilet Transfer: Minimal assistance;Moderate assistance;RW;BSC   Toileting- Clothing Manipulation and Hygiene: Moderate assistance;Sit to/from stand       Functional mobility during ADLs: Moderate assistance General ADL Comments: Increased level of alertness and command following     Vision       Perception     Praxis      Cognition Arousal/Alertness: Awake/alert Behavior During Therapy: WFL for tasks assessed/performed Overall Cognitive Status: Impaired/Different from baseline                                 General Comments: patient with significantly improved LOA from prior session        Exercises     Shoulder Instructions       General Comments VSS; RN cleared PT for short session following MD check following bloody sputum    Pertinent Vitals/ Pain       Pain  Assessment: No/denies pain Pain Intervention(s): Monitored during session                                                          Frequency  Min 2X/week        Progress Toward Goals  OT Goals(current goals can now be found in the care plan section)  Progress towards OT goals: Progressing toward goals  Acute Rehab OT Goals Patient Stated Goal: Wanting to do more for himself OT Goal Formulation: With patient Time For Goal Achievement: 08/22/20 Potential to Achieve Goals: Good  Plan      Co-evaluation                 AM-PAC OT "6 Clicks" Daily Activity      Outcome Measure   Help from another person eating meals?: A Little Help from another person taking care of personal grooming?: A Little Help from another person toileting, which includes using toliet, bedpan, or urinal?: A Lot Help from another person bathing (including washing, rinsing, drying)?: A Lot Help from another person to put on and taking off regular upper body clothing?: A Little Help from another person to put on and taking off regular lower body clothing?: A Lot 6 Click Score: 15    End of Session Equipment Utilized During Treatment: Rolling walker;Oxygen;Gait belt  OT Visit Diagnosis: Unsteadiness on feet (R26.81);Other abnormalities of gait and mobility (R26.89);Muscle weakness (generalized) (M62.81);Other symptoms and signs involving cognitive function   Activity Tolerance Patient tolerated treatment well   Patient Left in bed;with call bell/phone within reach;with bed alarm set   Nurse Communication Mobility status        Time: 9892-1194 OT Time Calculation (min): 21 min  Charges: OT General Charges $OT Visit: 1 Visit OT Treatments $Self Care/Home Management : 8-22 mins  08/15/2020  Rich, OTR/L  Acute Rehabilitation Services  Office:  [phone]    Suzanna Obey 08/15/2020, 2:01 PM

## 2020-08-16 DIAGNOSIS — I5033 Acute on chronic diastolic (congestive) heart failure: Secondary | ICD-10-CM | POA: Diagnosis not present

## 2020-08-16 LAB — GLUCOSE, CAPILLARY
Glucose-Capillary: 124 mg/dL — ABNORMAL HIGH (ref 70–99)
Glucose-Capillary: 121 mg/dL — ABNORMAL HIGH (ref 70–99)
Glucose-Capillary: 111 mg/dL — ABNORMAL HIGH (ref 70–99)
Glucose-Capillary: 118 mg/dL — ABNORMAL HIGH (ref 70–99)

## 2020-08-16 LAB — BASIC METABOLIC PANEL
Anion gap: 9 (ref 5–15)
BUN: 30 mg/dL — ABNORMAL HIGH (ref 8–23)
CO2: 25 mmol/L (ref 22–32)
Calcium: 8.7 mg/dL — ABNORMAL LOW (ref 8.9–10.3)
Chloride: 97 mmol/L — ABNORMAL LOW (ref 98–111)
Creatinine, Ser: 0.91 mg/dL (ref 0.61–1.24)
GFR, Estimated: >60 mL/min (ref >60)
Glucose, Bld: 90 mg/dL (ref 70–99)
Potassium: 3.4 mmol/L — ABNORMAL LOW (ref 3.5–5.1)
Sodium: 131 mmol/L — ABNORMAL LOW (ref 135–145)

## 2020-08-16 MED ORDER — POTASSIUM CHLORIDE CRYS ER 20 MEQ PO TBCR: 40.0000 meq | EXTENDED_RELEASE_TABLET | Freq: Every day | ORAL | 1 refills | Status: DC

## 2020-08-16 MED ORDER — TORSEMIDE 20 MG PO TABS: 20.0000 mg | ORAL_TABLET | Freq: Every day | ORAL | 2 refills | Status: DC

## 2020-08-16 MED ORDER — IPRATROPIUM-ALBUTEROL 0.5-2.5 (3) MG/3ML IN SOLN: 3.0000 mL | RESPIRATORY_TRACT | 1 refills | Status: DC | PRN

## 2020-08-16 MED ORDER — POTASSIUM CHLORIDE CRYS ER 20 MEQ PO TBCR: 40.0000 meq | EXTENDED_RELEASE_TABLET | Freq: Once | ORAL | Status: DC

## 2020-08-16 MED ORDER — SPIRONOLACTONE 25 MG PO TABS: 25.0000 mg | ORAL_TABLET | Freq: Every day | ORAL | 3 refills | Status: DC

## 2020-08-16 MED ORDER — LOSARTAN POTASSIUM 100 MG PO TABS: 100.0000 mg | ORAL_TABLET | Freq: Every day | ORAL | 1 refills | Status: DC

## 2020-08-16 MED ORDER — TAMSULOSIN HCL 0.4 MG PO CAPS: 0.4000 mg | ORAL_CAPSULE | Freq: Every day | ORAL | 3 refills | Status: DC

## 2020-08-16 MED ORDER — BUDESONIDE 0.5 MG/2ML IN SUSP: 0.5000 mg | Freq: Two times a day (BID) | RESPIRATORY_TRACT | 1 refills | Status: DC

## 2020-08-16 MED ORDER — ADULT MULTIVITAMIN W/MINERALS CH: 1.0000 | ORAL_TABLET | Freq: Every day | ORAL | 0 refills | Status: DC

## 2020-08-16 MED ORDER — APIXABAN 5 MG PO TABS: 5.0000 mg | ORAL_TABLET | Freq: Two times a day (BID) | ORAL | 6 refills | Status: DC

## 2020-08-16 MED ORDER — LACTULOSE 10 GM/15ML PO SOLN: 20.0000 g | Freq: Two times a day (BID) | ORAL | 0 refills | Status: DC

## 2020-08-16 NOTE — Discharge Summary (Addendum)
Physician Discharge Summary  Mitchell King:[id number] DOB: 02-Jan-1951 DOA: 08/04/2020  PCP: Patient, No Pcp Per  Admit date: 08/04/2020 Discharge date: 08/16/2020  Admitted From: Home  Disposition: SNF  Recommendations for Outpatient Follow-up:  1. Follow up with PCP in 1-2 weeks 2. Please obtain BMP/CBC in one week 3. Needs to follow up with cardiology for further care of HF.  4. Needs to follow up with Pulmonologist for further care of PE>  5. Needs to follow up with GI for further care of liver diseases.  6. Follow up with urology for urinary retention. Patient was started on Flomax.     Discharge Condition Stable.  CODE STATUS: Full code Diet recommendation: Heart Healthy  Brief/Interim Summary: 70 year old with past medical history significant for ongoing tobacco abuse, prior alcohol assessment, diabetes type 2, who was admitted with acute hypoxic respiratory failure, right heart failure/cor pulmonale and bilateral PE, treated with diuretics and anticoagulation.  He developed progressive encephalopathy, was noted to have elevated ammonia level and a started on lactulose.  On 12/18 he developed worsening encephalopathy secondary to hypercarbia and hyperammonemia, was intubated and transferred to the ICU, hospitalization was complicated by ileus as well requiring NG decompression. -Patient mental status has improved an ileus as well.  He was extubated 12/20 -Transfer to [facility] service on 12/22.    1-Acute on Chronic Hypoxic and Hypercapnic Respiratory Failure, Acute right Heart Failure, Diastolic dysfunction/cor pulmonale; -Secondary to CHF, bilateral PE, underlying COPD. -Patient was intubated for worsening encephalopathy on 12/18 and extubated on 12/20. -Echo with preserved ejection fraction, right ventricle is significantly enlarged. -Cardiology following, holding Lasix IV due to severe hypokalemia on 2/23. -Wean off oxygen as tolerated. Down to 2 L.  -Transition to oral  torsemide. Spironolactone, Cozaar.   2-Acute bilateral pulmonary embolism:  - He was transitioned to Eliquis 2/22. He received 7 days IV heparin.  -Nurse reported patient cough small amount of blood 2/25. Very small amount of hemoptysis today.  Stable for discharge.  -There was concern that PE may have precipitated worsening HF>  -Follow with Pulmonary to determine if patient will need life long anticoagulation.   3-Encephalopathy:  -Multifactorial, secondary to hypercarbia, elevated ammonia, could have chronic liver disease from alcoholism and as well as heart failure. -Continue with lactulose -Alert, answering questions.   4-Ileus: Appears to be improving, had bowel movement. Patient pull NG tube on 2/22 Had BM yesterday. Advanced diet heart healthy 2/24. Tolerating diet.   Chronic liver diseases;  -Likely secondary to prior alcoholism and and right heart failure -Ultrasound noted abnormal echogenicity concerning for parenchymal liver disease -Bilirubin mildly elevated. Needs follow up LFT>   Urinary retention: Fail voiding trial.  Started on Flomax.  He needs follow up with urology  Type 2 diabetes: received a sliding scale insulin CBG 111--171. Received minimal insulin, discontinue at discharge.   COPD: Counseled about smoking cessation  Hypokalemia; replenished.  Oral potasium supplement.  labs pending for the morning.    Discharge Diagnoses:  Principal Problem:   Acute exacerbation of CHF (congestive heart failure) (HCC) Active Problems:   Chronic obstructive pulmonary disease (HCC)   Essential hypertension   Diabetes mellitus (HCC)   Nondependent alcohol abuse, continuous drinking behavior   Acute cor pulmonale (HCC)   Pulmonary embolism (HCC)   Right heart failure (HCC)   Acute respiratory failure (HCC)   Hyperammonemia (HCC)   Malnutrition of moderate degree    Discharge Instructions  Discharge Instructions    Diet - low sodium  heart  healthy   Complete by: As directed    Increase activity slowly   Complete by: As directed      Allergies as of 08/16/2020   No Known Allergies     Medication List    STOP taking these medications   azithromycin 250 MG tablet Commonly known as: ZITHROMAX   hydrochlorothiazide 12.5 MG tablet Commonly known as: HYDRODIURIL     TAKE these medications   acetaminophen 500 MG tablet Commonly known as: TYLENOL Take 500-1,000 mg by mouth every 6 (six) hours as needed for mild pain (or headaches).   albuterol 108 (90 Base) MCG/ACT inhaler Commonly known as: VENTOLIN HFA Inhale 2 puffs every 4 (four) hours as needed into the lungs for wheezing or shortness of breath.   apixaban 5 MG Tabs tablet Commonly known as: ELIQUIS Take 1 tablet (5 mg total) by mouth 2 (two) times daily.   budesonide 0.5 MG/2ML nebulizer solution Commonly known as: PULMICORT Take 2 mLs (0.5 mg total) by nebulization 2 (two) times daily.   guaifenesin 100 MG/5ML syrup Commonly known as: ROBITUSSIN Take 100 mg by mouth at bedtime as needed (for chest congestion).   ipratropium-albuterol 0.5-2.5 (3) MG/3ML Soln Commonly known as: DUONEB Take 3 mLs by nebulization every 4 (four) hours as needed.   lactulose 10 GM/15ML solution Commonly known as: CHRONULAC Take 30 mLs (20 g total) by mouth 2 (two) times daily.   losartan 100 MG tablet Commonly known as: COZAAR Take 1 tablet (100 mg total) by mouth daily.   multivitamin with minerals Tabs tablet Take 1 tablet by mouth daily.   potassium chloride SA 20 MEQ tablet Commonly known as: KLOR-CON Take 2 tablets (40 mEq total) by mouth daily.   spironolactone 25 MG tablet Commonly known as: ALDACTONE Take 1 tablet (25 mg total) by mouth daily.   Stiolto Respimat 2.5-2.5 MCG/ACT Aers Generic drug: Tiotropium Bromide-Olodaterol Inhale 1 puff into the lungs in the morning and at bedtime.   tamsulosin 0.4 MG Caps capsule Commonly known as: FLOMAX Take 1  capsule (0.4 mg total) by mouth daily.   torsemide 20 MG tablet Commonly known as: DEMADEX Take 1 tablet (20 mg total) by mouth daily.       Follow-up Information    Lorin Glass, MD Follow up in 3 month(s).   Specialty: Pulmonary Disease Contact information: [address] [place] [address] [phone]        Little Ishikawa, MD Follow up in 2 week(s).   Specialties: Cardiology, Radiology Contact information: [address] [place] [address] [phone]              No Known Allergies  Consultations:  [facility]  Cardiology    Procedures/Studies: CT ABDOMEN PELVIS WO CONTRAST  Result Date: 08/09/2020 CLINICAL DATA:  Abdominal distension.  Acute abdominal pain EXAM: CT ABDOMEN AND PELVIS WITHOUT CONTRAST TECHNIQUE: Multidetector CT imaging of the abdomen and pelvis was performed following the standard protocol without IV contrast. COMPARISON:  None similar and available. Right upper quadrant ultrasound from 2 days ago. FINDINGS: Lower chest:  Lower lobe atelectasis with small pleural effusions. Hepatobiliary: No focal liver abnormality.Numerous calcified gallstones. No focal pericholecystic inflammation. Pancreas: Unremarkable. Spleen: Unremarkable. Adrenals/Urinary Tract: Negative adrenals. Mild bilateral hydroureteronephrosis above a distended urinary bladder. Right renal cysts which have a simple appearance by CT, up to 5.7 cm from the right lower pole. Unremarkable bladder. Stomach/Bowel: No obstruction or ileus. No visible bowel inflammation. There is an  enteric tube with tip at the mid stomach. Vascular/Lymphatic: Extensive atheromatous calcification. No mass or adenopathy. Reproductive:No acute finding. Other: No ascites or pneumoperitoneum. Anasarca. Small umbilical hernia containing fat. Musculoskeletal: No acute abnormalities. Diffuse facet osteoarthritis. L4-5 anterolisthesis. IMPRESSION: 1. Distended urinary bladder reaching the  umbilicus and causing bilateral hydroureteronephrosis. 2.  Aortic Atherosclerosis (ICD10-I70.0). 3. Anasarca. 4. Lower lobe atelectasis and small pleural effusions. 5. Cholelithiasis. Electronically Signed   By: Marnee Spring M.D.   On: 08/09/2020 07:23   DG Chest 2 View  Result Date: 08/04/2020 CLINICAL DATA:  Shortness of breath, leg swelling, decreased O2 sats. EXAM: CHEST - 2 VIEW COMPARISON:  10/11/2013. FINDINGS: Trachea is midline. Heart is enlarged. Thoracic aorta is calcified. Mild basilar predominant interstitial prominence and indistinctness with bibasilar atelectasis and small bilateral pleural effusions. IMPRESSION: Mild congestive heart failure. Electronically Signed   By: Leanna Battles M.D.   On: 08/04/2020 15:03   DG Abd 1 View  Result Date: 08/12/2020 CLINICAL DATA:  Ileus. EXAM: ABDOMEN - 1 VIEW COMPARISON:  08/11/2020. FINDINGS: NG tube has been removed. Persistent but improved slightly dilated loops of small bowel noted. Colonic gas pattern is normal. No free air identified. Degenerative changes lumbar spine and both hips. Aortoiliac atherosclerotic vascular calcification. Pelvic calcifications consistent phleboliths. Left base atelectasis/infiltrate. IMPRESSION: 1. NG tube has been removed. Persistent but improved slightly dilated loops of small bowel noted. Colonic gas pattern is normal. 2. Left base atelectasis/infiltrate. Electronically Signed   By: Maisie Fus  Register   On: 08/12/2020 06:38   DG Abd 1 View  Result Date: 08/11/2020 CLINICAL DATA:  Nasogastric tube placement. EXAM: ABDOMEN - 1 VIEW COMPARISON:  Same day. FINDINGS: Nasogastric tube tip is seen in proximal stomach. Stable mild small bowel dilatation is noted without colonic dilatation. IMPRESSION: Nasogastric tube tip seen in proximal stomach. Stable mild small bowel dilatation. Electronically Signed   By: Lupita Raider M.D.   On: 08/11/2020 11:57   DG Abd 1 View  Result Date: 08/11/2020 CLINICAL DATA:   Abdominal distension, pain EXAM: ABDOMEN - 1 VIEW COMPARISON:  None. FINDINGS: Air-filled loops of mildly dilated small bowel measuring up to 4.2 cm. Air is present within nondistended colon. No large volume free air on this supine study. IMPRESSION: Mildly dilated loops of small bowel may reflect small bowel obstruction or ileus. Electronically Signed   By: Guadlupe Spanish M.D.   On: 08/11/2020 10:10   CT HEAD WO CONTRAST  Result Date: 08/07/2020 CLINICAL DATA:  Mental status change with unknown cause EXAM: CT HEAD WITHOUT CONTRAST TECHNIQUE: Contiguous axial images were obtained from the base of the skull through the vertex without intravenous contrast. COMPARISON:  04/20/2017 FINDINGS: Brain: No evidence of acute infarction, hemorrhage, hydrocephalus, extra-axial collection or mass lesion/mass effect. Vermian atrophy. There is history of alcohol abuse. Vascular: No hyperdense vessel or unexpected calcification. Skull: Normal. Negative for fracture or focal lesion. Sinuses/Orbits: No acute finding. 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 Electronically Signed   By: Marnee Spring M.D.   On: 08/07/2020 04:44   CT ANGIO CHEST PE W OR WO CONTRAST  Result Date: 08/05/2020 CLINICAL DATA:  Leg swelling for two days and shortness of breath. EXAM: CT ANGIOGRAPHY CHEST WITH CONTRAST TECHNIQUE: Multidetector CT imaging of the chest was performed using the standard protocol during bolus administration of intravenous contrast. Multiplanar CT image reconstructions and MIPs were obtained to evaluate the vascular anatomy. CONTRAST:  OMNIPAQUE  IOHEXOL 350 MG/ML SOLN COMPARISON:  Same day chest radiograph and report from CT chest dated 08/27/2001. FINDINGS: Cardiovascular: Motion artifact limits the sensitivity and specificity of the exam. Given this limitation, there is satisfactory opacification of the pulmonary arteries to the  segmental level. Several subsegmental filling defects are seen in the pulmonary arteries supplying the bilateral upper and the left lower lobes, consistent with pulmonary emboli. The main pulmonary artery is dilated, measuring 3.9 cm in diameter, consistent with pulmonary hypertension. Vascular calcifications are seen in the aortic arch. The heart is mildly enlarged. No pericardial effusion. Mediastinum/Nodes: No enlarged mediastinal, hilar, or axillary lymph nodes. Thyroid gland, trachea, and esophagus demonstrate no significant findings. Lungs/Pleura: Aspirated debris and bronchial wall thickening is seen in the right lower lobe. There is no pneumothorax on either side. There are moderate bilateral pleural effusions, right greater than left, with associated atelectasis. Upper Abdomen: No acute abnormality. Musculoskeletal: Degenerative changes are seen in the spine. Review of the MIP images confirms the above findings. IMPRESSION: 1. Several subsegmental pulmonary emboli in the pulmonary arteries supplying the bilateral upper lobes and the left lower lobe. 2. Moderate bilateral pleural effusions, right greater than left, with associated atelectasis. 3. Aspirated debris and bronchial wall thickening in the right lower lobe likely reflect chronic aspiration. 4. Enlarged pulmonary artery likely representing pulmonary hypertension. Mild cardiomegaly. Aortic Atherosclerosis (ICD10-I70.0). These results were called by telephone at the time of interpretation on 08/05/2020 at 8:12 pm to provider Joen Laura, who verbally acknowledged these results. Electronically Signed   By: Romona Curls M.D.   On: 08/05/2020 20:17   DG CHEST PORT 1 VIEW  Result Date: 08/13/2020 CLINICAL DATA:  Shortness of breath. EXAM: PORTABLE CHEST 1 VIEW COMPARISON:  Chest x-ray 08/11/2020, 08/08/2020. FINDINGS: Cardiomegaly with pulmonary venous congestion. Mild interstitial prominence again noted with interim improvement from prior  exam. Findings suggest improving interstitial edema. No pleural effusion or pneumothorax. Degenerative changes scoliosis thoracic spine. Carotid vascular calcification. IMPRESSION: 1. Cardiomegaly with pulmonary venous congestion. Mild interstitial prominence again noted with interim improvement from prior exam. Findings suggest improving interstitial edema. 2.  Carotid vascular disease. Electronically Signed   By: Maisie Fus  Register   On: 08/13/2020 12:47   DG Chest Port 1 View  Result Date: 08/11/2020 CLINICAL DATA:  Pulmonary edema EXAM: PORTABLE CHEST 1 VIEW COMPARISON:  Yesterday FINDINGS: Esophageal extubation. Hazy bilateral pulmonary opacity from pleural effusions and atelectasis by most recent CTs. Cardiomegaly. No visible pneumothorax. IMPRESSION: Unchanged hazy chest opacification correlating with atelectasis and pleural fluid on recent CT. Electronically Signed   By: Marnee Spring M.D.   On: 08/11/2020 06:13   DG Chest Port 1 View  Result Date: 08/10/2020 CLINICAL DATA:  Acute respiratory failure. EXAM: PORTABLE CHEST 1 VIEW COMPARISON:  08/08/2020 FINDINGS: There is an NG tube with tip and side port below GE junction. Aortic atherosclerosis. Stable cardiomediastinal contours. Persistent diffuse increased interstitial opacities in both lungs. IMPRESSION: 1. Unchanged diffuse increase interstitial opacities within both lungs Electronically Signed   By: Signa Kell M.D.   On: 08/10/2020 07:37   DG CHEST PORT 1 VIEW  Result Date: 08/08/2020 CLINICAL DATA:  Intubation EXAM: PORTABLE CHEST 1 VIEW COMPARISON:  Portable exam 1646 hours compared to 08/08/2020 FINDINGS: No endotracheal tube tip projecting 5.8 cm above carina. Nasogastric tube extends into stomach. Normal heart size and mediastinal contours. Atherosclerotic calcification aorta. Pulmonary vascular congestion. Pulmonary infiltrates are identified in the mid to lower lungs bilaterally, slightly increased since previous exam, question  pulmonary edema versus multifocal infection. RIGHT pleural effusion. No pneumothorax. IMPRESSION: Slightly increased pulmonary infiltrates in mid to lower lungs greater on RIGHT which could represent edema or infection. RIGHT pleural effusion. Endotracheal nasogastric tube positions as above. Electronically Signed   By: Ulyses Southward M.D.   On: 08/08/2020 17:04   DG CHEST PORT 1 VIEW  Result Date: 08/08/2020 CLINICAL DATA:  Altered mental status. EXAM: PORTABLE CHEST 1 VIEW COMPARISON:  CT chest dated August 05, 2020. Chest x-ray dated August 04, 2020. FINDINGS: The left costophrenic angle is excluded from the field of view. Unchanged mild cardiomegaly and pulmonary vascular congestion. Decreased small left and unchanged small right pleural effusions with unchanged mild right basilar atelectasis. Improving aeration at the left lung base. No pneumothorax. No acute osseous abnormality. IMPRESSION: 1. Decreased small left and unchanged small right pleural effusions with bibasilar atelectasis. Electronically Signed   By: Obie Dredge M.D.   On: 08/08/2020 10:25   ECHOCARDIOGRAM COMPLETE  Result Date: 08/05/2020    ECHOCARDIOGRAM REPORT   Patient Name:   Mitchell King Date of Exam: 08/05/2020 Medical Rec #:  [id number]       Height:       73.0 in Accession #:    [id number]      Weight:       180.6 lb Date of Birth:  11/03/1950        BSA:          2.060 m Patient Age:    70 years        BP:           143/76 mmHg Patient Gender: M               HR:           87 bpm. [facility] Procedure: 2D Echo Indications:    CHF-Acute Diastolic I50.31  History:        Patient has prior history of Echocardiogram examinations, most                 recent 07/16/2009. CHF, COPD; Risk Factors:Diabetes, Hypertension                 and Current Smoker. ETOH.  Sonographer:    Jeryl Columbia Referring Phys: [id number] MOHAMMAD L GARBA IMPRESSIONS  1. Left ventricular ejection fraction, by estimation, is 55 to 60%. The left  ventricle has normal function. The left ventricle has no regional wall motion abnormalities. There is mild concentric left ventricular hypertrophy. Left ventricular diastolic parameters were normal. There is the interventricular septum is flattened in systole, consistent with right ventricular pressure overload.  2. Right ventricular systolic function is normal. The right ventricular size is severely enlarged. Moderately increased right ventricular wall thickness. There is mildly elevated pulmonary artery systolic pressure. The estimated right ventricular systolic pressure is 44.4 mmHg (suspect this is an underestimation due to the faint TR jet).  3. Right atrial size was severely dilated.  4. The mitral valve is normal in structure. No evidence of mitral valve regurgitation. No evidence of mitral stenosis.  5. The aortic valve is tricuspid. Aortic valve regurgitation is not visualized. No aortic stenosis is present.  6. The inferior vena cava is dilated in size with <50% respiratory variability, suggesting right atrial pressure of 15 mmHg. Comparison(s): Prior images unable to be directly viewed, comparison made by report only. FINDINGS  Left Ventricle: Left ventricular ejection fraction, by estimation, is 55 to 60%. The left  ventricle has normal function. The left ventricle has no regional wall motion abnormalities. The left ventricular internal cavity size was normal in size. There is  mild concentric left ventricular hypertrophy. The interventricular septum is flattened in systole, consistent with right ventricular pressure overload. Left ventricular diastolic parameters were normal. Indeterminate filling pressures. Right Ventricle: The right ventricular size is severely enlarged. Moderately increased right ventricular wall thickness. Right ventricular systolic function is normal. There is mildly elevated pulmonary artery systolic pressure. The tricuspid regurgitant  velocity is 2.71 m/s, and with an assumed  right atrial pressure of 15 mmHg, the estimated right ventricular systolic pressure is 44.4 mmHg. Left Atrium: Left atrial size was normal in size. Right Atrium: Right atrial size was severely dilated. Pericardium: There is no evidence of pericardial effusion. Mitral Valve: The mitral valve is normal in structure. No evidence of mitral valve regurgitation. No evidence of mitral valve stenosis. Tricuspid Valve: The tricuspid valve is normal in structure. Tricuspid valve regurgitation is mild . No evidence of tricuspid stenosis. Aortic Valve: The aortic valve is tricuspid. Aortic valve regurgitation is not visualized. No aortic stenosis is present. Pulmonic Valve: The pulmonic valve was normal in structure. Pulmonic valve regurgitation is trivial. No evidence of pulmonic stenosis. Aorta: The aortic root is normal in size and structure. Venous: The inferior vena cava is dilated in size with less than 50% respiratory variability, suggesting right atrial pressure of 15 mmHg. IAS/Shunts: There is left bowing of the interatrial septum, suggestive of elevated right atrial pressure. No atrial level shunt detected by color flow Doppler.  LEFT VENTRICLE PLAX 2D LVIDd:         4.20 cm  Diastology LVIDs:         2.80 cm  LV e' medial:    8.05 cm/s LV PW:         1.20 cm  LV E/e' medial:  13.2 LV IVS:        1.40 cm  LV e' lateral:   9.14 cm/s LVOT diam:     2.00 cm  LV E/e' lateral: 11.6 LVOT Area:     3.14 cm  RIGHT VENTRICLE RV S prime:     10.70 cm/s TAPSE (M-mode): 2.0 cm LEFT ATRIUM           Index       RIGHT ATRIUM           Index LA diam:      3.60 cm 1.75 cm/m  RA Area:     18.70 cm LA Vol (A4C): 62.1 ml 30.14 ml/m RA Volume:   53.60 ml  26.02 ml/m   AORTA Ao Root diam: 3.40 cm MITRAL VALVE                TRICUSPID VALVE MV Area (PHT): 5.54 cm     TR Peak grad:   29.4 mmHg MV Decel Time: 137 msec     TR Vmax:        271.00 cm/s MV E velocity: 106.00 cm/s MV A velocity: 84.80 cm/s   SHUNTS MV E/A ratio:  1.25          Systemic Diam: 2.00 cm Rachelle HoraMihai Croitoru MD Electronically signed by Thurmon FairMihai Croitoru MD Signature Date/Time: 08/05/2020/10:25:53 AM    Final    VAS US LOWER EXTREMITY VENOUS (DVT)  Result Date: 08/06/2020  Lower Venous DVT Study Indications: Pulmonary embolism, SOB, bilateral swelling.  Comparison Study: 08-05-2020 CTA chest was positive for pulmonary embolism. Performing Technologist: Jean Rosenthalachel Hodge RDMS  Examination Guidelines: A complete evaluation includes B-mode imaging, spectral Doppler, color Doppler, and power Doppler as needed of all accessible portions of each vessel. Bilateral testing is considered an integral part of a complete examination. Limited examinations for reoccurring indications may be performed as noted. The reflux portion of the exam is performed with the patient in reverse Trendelenburg.  +---------+---------------+---------+-----------+----------+--------------+ RIGHT    CompressibilityPhasicitySpontaneityPropertiesThrombus Aging +---------+---------------+---------+-----------+----------+--------------+ CFV      Full           No       Yes                                 +---------+---------------+---------+-----------+----------+--------------+ SFJ      Full                                                        +---------+---------------+---------+-----------+----------+--------------+ FV Prox  Full                                                        +---------+---------------+---------+-----------+----------+--------------+ FV Mid   Full                                                        +---------+---------------+---------+-----------+----------+--------------+ FV DistalFull                                                        +---------+---------------+---------+-----------+----------+--------------+ PFV      Full                                                         +---------+---------------+---------+-----------+----------+--------------+ POP      Full           No       Yes                                 +---------+---------------+---------+-----------+----------+--------------+ PTV      Full                                                        +---------+---------------+---------+-----------+----------+--------------+ PERO     Full                                                        +---------+---------------+---------+-----------+----------+--------------+   +---------+---------------+---------+-----------+----------+--------------+  LEFT     CompressibilityPhasicitySpontaneityPropertiesThrombus Aging +---------+---------------+---------+-----------+----------+--------------+ CFV      Full           No       Yes                                 +---------+---------------+---------+-----------+----------+--------------+ SFJ      Full                                                        +---------+---------------+---------+-----------+----------+--------------+ FV Prox  Full                                                        +---------+---------------+---------+-----------+----------+--------------+ FV Mid   Full                                                        +---------+---------------+---------+-----------+----------+--------------+ FV DistalFull                                                        +---------+---------------+---------+-----------+----------+--------------+ PFV      Full                                                        +---------+---------------+---------+-----------+----------+--------------+ POP      Full           No       Yes                                 +---------+---------------+---------+-----------+----------+--------------+ PTV      Full                                                         +---------+---------------+---------+-----------+----------+--------------+ PERO     Full                                                        +---------+---------------+---------+-----------+----------+--------------+     Summary: RIGHT: - There is no evidence of deep vein thrombosis in the lower extremity.  - No cystic structure found in the popliteal fossa.  - Pulsatile waveforms consistent with elevated right heart pressures.  LEFT: - There is no  evidence of deep vein thrombosis in the lower extremity.  - No cystic structure found in the popliteal fossa.  - Pulsatile waveforms consistent with elevated right heart pressures.  *See table(s) above for measurements and observations. Electronically signed by Coral Else MD on 08/06/2020 at 9:47:18 PM.    Final    US Abdomen Limited RUQ (LIVER/GB)  Result Date: 08/07/2020 CLINICAL DATA:  Elevated liver enzymes EXAM: ULTRASOUND ABDOMEN LIMITED RIGHT UPPER QUADRANT COMPARISON:  Abdominal ultrasound August 26, 2009 FINDINGS: Gallbladder: The gallbladder is contracted and filled with calculi. Largest individual gallstone measures approximately 1 cm in length. Gallbladder wall is thickened without edema or pericholecystic fluid. No sonographic Murphy sign noted by sonographer. Common bile duct: Diameter: 5 mm. No intrahepatic or extrahepatic biliary duct dilatation. Liver: No focal lesion identified. Liver echogenicity overall is increased. Portal vein is patent on color Doppler imaging with normal direction of blood flow towards the liver. Other: There is mild ascites.  Small right pleural effusion. IMPRESSION: 1. Gallbladder is contracted and filled with calculi. Gallbladder wall mildly thickened without gallbladder wall edema. No pericholecystic fluid. 2. Increased liver echogenicity, a finding likely indicative of hepatic steatosis, potentially with underlying parenchymal liver disease. No focal liver lesions are demonstrable on this study. 3.  Mild  ascites. 4.  Small right pleural effusion. Electronically Signed   By: Bretta Bang III M.D.   On: 08/07/2020 16:03   (Echo, Carotid, EGD, Colonoscopy, ERCP)    Subjective:   Discharge Exam: Vitals:   08/16/20 0400 08/16/20 0819  BP: (!) 114/57   Pulse:    Resp:    Temp: 98.7 F (37.1 C) 98.9 F (37.2 C)  SpO2: 94%      General: Pt is alert, awake, not in acute distress Cardiovascular: RRR, S1/S2 +, no rubs, no gallops Respiratory: CTA bilaterally, no wheezing, no rhonchi Abdominal: Soft, NT, ND, bowel sounds + Extremities: no edema, no cyanosis    The results of significant diagnostics from this hospitalization (including imaging, microbiology, ancillary and laboratory) are listed below for reference.     Microbiology: Recent Results (from the past 240 hour(s))  MRSA PCR Screening     Status: None   Collection Time: 08/08/20  6:39 PM   Specimen: Nasal Mucosa; Nasopharyngeal  Result Value Ref Range Status   MRSA by PCR NEGATIVE NEGATIVE Final    Comment:        The GeneXpert MRSA Assay (FDA approved for NASAL specimens only), is one component of a comprehensive MRSA colonization surveillance program. It is not intended to diagnose MRSA infection nor to guide or monitor treatment for MRSA infections. Performed at [facility], [address], [place], [place] [address]   SARS CORONAVIRUS 2 (TAT 6-24 HRS) Nasopharyngeal Nasopharyngeal Swab     Status: None   Collection Time: 08/14/20 11:59 AM   Specimen: Nasopharyngeal Swab  Result Value Ref Range Status   SARS Coronavirus 2 NEGATIVE NEGATIVE Final    Comment: (NOTE) SARS-CoV-2 target nucleic acids are NOT DETECTED.  The SARS-CoV-2 RNA is generally detectable in upper and lower respiratory specimens during the acute phase of infection. Negative results do not preclude SARS-CoV-2 infection, do not rule out co-infections with other pathogens, and should not be used as the sole basis for treatment  or other patient management decisions. Negative results must be combined with clinical observations, patient history, and epidemiological information. The expected result is Negative.  Fact Sheet for Patients: HairSlick.no  Fact Sheet for Healthcare Providers: quierodirigir.com  This test is not yet approved or cleared by the [place] and  has been authorized for detection and/or diagnosis of SARS-CoV-2 by FDA under an Emergency Use Authorization (EUA). This EUA will remain  in effect (meaning this test can be used) for the duration of the COVID-19 declaration under Se ction 564(b)(1) of the Act, 21 U.S.C. section 360bbb-3(b)(1), unless the authorization is terminated or revoked sooner.  Performed at [facility], [address], [place], [place] [address]      Labs: BNP (last 3 results) Recent Labs    08/04/20 2003 08/08/20 1452 08/13/20 0326  BNP 690.9* 781.2* 604.3*   Basic Metabolic Panel: Recent Labs  Lab 08/09/20 1043 08/09/20 1835 08/10/20 0546 08/10/20 1602 08/11/20 0033 08/12/20 0034 08/13/20 0326 08/13/20 1044 08/14/20 0340 08/15/20 0255  NA 140  --  141  --    < > 144 141 137 131* 133*  K 3.6  --  3.3*  --    < > 3.5 2.6* 2.9* 4.4 3.1*  CL 96*  --  101  --    < > 98 93* 92* 94* 92*  CO2 33*  --  31  --    < > 35* 38* 36* 30 31  GLUCOSE 80  --  140*  --    < > 100* 57* 189* 92 99  BUN 43*  --  48*  --    < > 40* 37* 34* 31* 34*  CREATININE 1.24  --  1.18  --    < > 1.20 1.08 1.02 0.82 1.09  CALCIUM 9.1  --  8.5*  --    < > 9.8 9.1 9.0 8.7* 8.8*  MG 2.5* 2.2 2.3 2.6*  --   --  2.1  --   --   --   PHOS 2.0* 3.5 4.3 5.0*  --   --   --   --   --   --    < > = values in this interval not displayed.   Liver Function Tests: Recent Labs  Lab 08/09/20 1043 08/10/20 0546 08/11/20 0033 08/13/20 0326  AST ALT ALKPHOS 57 50 57 59  BILITOT 2.5* 2.0* 1.8* 2.5*   PROT 7.4 6.9 7.7 7.2  ALBUMIN 3.0* 2.8* 3.1* 2.9*   No results for input(s): LIPASE, AMYLASE in the last 168 hours. Recent Labs  Lab 08/12/20 0853  AMMONIA 30   CBC: Recent Labs  Lab 08/11/20 0033 08/12/20 0034 08/13/20 0326 08/14/20 0813 08/15/20 0959  WBC 7.3 6.7 5.9 4.7 4.1  HGB 17.4* 17.1*  17.4* 16.7 16.3 16.4  HCT 57.1* 56.6*  54.5* 53.1* 52.0 51.9  MCV 100.2* 100.9* 99.6 98.5 96.1  PLT 194 184 156 154 152   Cardiac Enzymes: No results for input(s): CKTOTAL, CKMB, CKMBINDEX, TROPONINI in the last 168 hours. BNP: Invalid input(s): POCBNP CBG: Recent Labs  Lab 08/15/20 1552 08/15/20 2010 08/15/20 2346 08/16/20 0457 08/16/20 0817  GLUCAP 143* 111* 172* 124* 121*   D-Dimer No results for input(s): DDIMER in the last 72 hours. Hgb A1c No results for input(s): HGBA1C in the last 72 hours. Lipid Profile No results for input(s): CHOL, HDL, LDLCALC, TRIG, CHOLHDL, LDLDIRECT in the last 72 hours. Thyroid function studies No results for input(s): TSH, T4TOTAL, T3FREE, THYROIDAB in the last 72 hours.  Invalid input(s): FREET3 Anemia work up No results for input(s): VITAMINB12, FOLATE, FERRITIN, TIBC, IRON, RETICCTPCT in  the last 72 hours. Urinalysis    Component Value Date/Time   COLORURINE YELLOW 08/07/2020 1101   APPEARANCEUR HAZY (A) 08/07/2020 1101   LABSPEC 1.011 08/07/2020 1101   PHURINE 5.0 08/07/2020 1101   GLUCOSEU NEGATIVE 08/07/2020 1101   HGBUR LARGE (A) 08/07/2020 1101   BILIRUBINUR NEGATIVE 08/07/2020 1101   BILIRUBINUR neg 04/20/2017 1447   KETONESUR NEGATIVE 08/07/2020 1101   PROTEINUR NEGATIVE 08/07/2020 1101   UROBILINOGEN 1.0 04/20/2017 1447   NITRITE NEGATIVE 08/07/2020 1101   LEUKOCYTESUR NEGATIVE 08/07/2020 1101   Sepsis Labs Invalid input(s): PROCALCITONIN,  WBC,  LACTICIDVEN Microbiology Recent Results (from the past 240 hour(s))  MRSA PCR Screening     Status: None   Collection Time: 08/08/20  6:39 PM   Specimen: Nasal  Mucosa; Nasopharyngeal  Result Value Ref Range Status   MRSA by PCR NEGATIVE NEGATIVE Final    Comment:        The GeneXpert MRSA Assay (FDA approved for NASAL specimens only), is one component of a comprehensive MRSA colonization surveillance program. It is not intended to diagnose MRSA infection nor to guide or monitor treatment for MRSA infections. Performed at [facility], [address], [place], [place] [address]   SARS CORONAVIRUS 2 (TAT 6-24 HRS) Nasopharyngeal Nasopharyngeal Swab     Status: None   Collection Time: 08/14/20 11:59 AM   Specimen: Nasopharyngeal Swab  Result Value Ref Range Status   SARS Coronavirus 2 NEGATIVE NEGATIVE Final    Comment: (NOTE) SARS-CoV-2 target nucleic acids are NOT DETECTED.  The SARS-CoV-2 RNA is generally detectable in upper and lower respiratory specimens during the acute phase of infection. Negative results do not preclude SARS-CoV-2 infection, do not rule out co-infections with other pathogens, and should not be used as the sole basis for treatment or other patient management decisions. Negative results must be combined with clinical observations, patient history, and epidemiological information. The expected result is Negative.  Fact Sheet for Patients: HairSlick.no  Fact Sheet for Healthcare Providers: quierodirigir.com  This test is not yet approved or cleared by the [place] FDA and  has been authorized for detection and/or diagnosis of SARS-CoV-2 by FDA under an Emergency Use Authorization (EUA). This EUA will remain  in effect (meaning this test can be used) for the duration of the COVID-19 declaration under Se ction 564(b)(1) of the Act, 21 U.S.C. section 360bbb-3(b)(1), unless the authorization is terminated or revoked sooner.  Performed at [facility], [address] [place], [place] [address]      Time coordinating discharge: 40  minutes  SIGNED:   Alba Cory, MD  [facility]

## 2020-08-16 NOTE — TOC Transition Note (Addendum)
Transition of Care [facility]) - CM/SW Discharge Note   Patient Details  Name: FABRIZIO FILIP MRN: [id number] Date of Birth: 1951/02/19  [facility] CM/SW Contact:  Patrice Paradise, LCSW Phone Number: [phone] 08/16/2020, 11:09 AM   Clinical Narrative:     Patient will DC to:?[facility] Anticipated DC date:?08/16/2020 Family notified:?[facility] Transport by: Sharin Mons    Per MD patient ready for DC to [employer] [facility], patient, patient's family, and facility notified of DC. Discharge Summary sent to facility. RN given number for report  [phone] room 505. DC packet on chart. Ambulance transport requested for patient.   CSW signing off.   Judd Lien, [place] [phone]   Final next level of care: Skilled Nursing Facility Barriers to Discharge: Barriers Resolved   Patient Goals and CMS Choice Patient states their goals for this hospitalization and ongoing recovery are:: Son wants pt to go to SNF      Discharge Placement              Patient chooses bed at: [facility] Patient to be transferred to facility by: [facility] Name of family member notified: [place] Patient and family notified of of transfer: 08/16/20  Discharge Plan and Services                                     Social Determinants of Health (SDOH) Interventions     Readmission Risk Interventions No flowsheet data found.

## 2020-08-16 NOTE — Progress Notes (Signed)
Patient had episode of hemoptysis (small amount approximately quarter-sized). Dr. Sunnie Nielsen informed. At this time, no change in plan of care and patient clear to discharge if no additional episodes.

## 2020-08-25 ENCOUNTER — Non-Acute Institutional Stay (INDEPENDENT_AMBULATORY_CARE_PROVIDER_SITE_OTHER): Payer: Self-pay | Admitting: Orthopedic Surgery

## 2020-08-25 ENCOUNTER — Encounter: Payer: Self-pay | Admitting: Orthopedic Surgery

## 2020-08-25 DIAGNOSIS — I10 Essential (primary) hypertension: Secondary | ICD-10-CM

## 2020-08-25 DIAGNOSIS — I5033 Acute on chronic diastolic (congestive) heart failure: Secondary | ICD-10-CM

## 2020-08-25 DIAGNOSIS — E119 Type 2 diabetes mellitus without complications: Secondary | ICD-10-CM

## 2020-08-25 DIAGNOSIS — J449 Chronic obstructive pulmonary disease, unspecified: Secondary | ICD-10-CM

## 2020-08-25 DIAGNOSIS — E7220 Disorder of urea cycle metabolism, unspecified: Secondary | ICD-10-CM

## 2020-08-25 DIAGNOSIS — I2609 Other pulmonary embolism with acute cor pulmonale: Secondary | ICD-10-CM

## 2020-08-25 DIAGNOSIS — F1010 Alcohol abuse, uncomplicated: Secondary | ICD-10-CM

## 2020-08-25 DIAGNOSIS — E440 Moderate protein-calorie malnutrition: Secondary | ICD-10-CM

## 2020-08-25 DIAGNOSIS — Z978 Presence of other specified devices: Secondary | ICD-10-CM

## 2020-08-25 NOTE — Progress Notes (Signed)
Location:    [facility] Room Number: 505/P Place of Service:  SNF (31) Provider: Hazle Nordmann NP   Patient, No Pcp Per  Patient Care Team: Patient, No Pcp Per as PCP - General (General Practice)  Extended Emergency Contact Information Primary Emergency Contact: Johathon, [place] Mobile Phone: [phone] Relation: Son Secondary Emergency Contact: Neysa Hotter [place] Home Phone: [phone] Relation: Sister  Code Status:  Full Code Goals of care: Advanced Directive information Advanced Directives 08/25/2020  Does Patient Have a Medical Advance Directive? No  Would patient like information on creating a medical advance directive? No - Patient declined     Chief Complaint  Patient presents with  . Hospitalization Follow-up    Hospitalization Follow Up     HPI:  Pt is a 70 y.o. male seen today for a hospital f/u s/p admission from [facility] 02/14- 02/26.   He currently resides at [facility], seen at bedside today. PMH includes: congestive heart failure, hypertension, PE, COPD, hepatitis C, diabetes, and hyperammonemia.   Prior to hospitalization, he lived in a hotel and worked part time. He began to notice swelling throughout his body and trouble breathing with exertion. Son advised him to seek medical attention. In the ED, temp 98.4, blood pressure 172/99, pulse 87, RR 26, oxygen sat 73% on room air.  WBC 4.3. Hgb 16.2. Hct 54.4. MCV 102.3. Na 135. K 4.4. CL 99. Glucose 101. BUN 27. Creatine 1.25. Calcium 8.8. BNP 690.9. Troponin 44. TSH normal. D-dimer elevated. Chest x-ray suggested mild congestive heart failure. EKG sinus rhythm with evidence of left ventricular hypertrophy, RVH and short PR, no significant ST changes. CT chest found several subsegmental pulmonary emboli in the pulmonary arteries supplying the bilateral upper lobes and lower left lobe. He was admitted for acute exacerbation  of CHF. Discharge summary suggested acute respiratory failure with intubation 12/18 and extubation 12/20, no documentation found.CHF initially treated with IV lasix, he was switched to spironolactone and torsemide at discharge. Oxygen weaned off.  He was given IV heparin for acute PE and transitioned to Eliquis. Concerns PE may have caused worsening heart failure. Advised to follow up with pulmonary to determine long term anticoagulation. Encephalopathy multifactorial, secondary to hypercarbia, elevated ammonia and chronic liver disease. Advised to continue lactulose. Developed ileus during stay, NG placed for brief time. BM reported prior to discharge. Chronic liver disease secondary to alcoholism and right sided heart failure. Bilirubin mildly elevated, follow up LFT recommended. Developed urinary retention during stay, foley placed, failed voiding trial. Foley re-inserted and advised to follow up with urology outpatient. Discharged to snf for additional therapy services.   Today, he is sitting in bed, alert and oriented x 4. Follows commands and can express needs. Very pleasant. Denies chest pain or sob. Remains on room air. Last bowel movement this morning. Appetite fair. Ambulating about 150 ft with walker. Minimal assistance with ADLs. Urology follow up scheduled 03/09. Plans to live with son and grandson after discharge. Expresses concern about following low sodium diet, asking for additional information.   No recent falls or injuries.   Facility nurse does not report any concerns.    Past Medical History:  Diagnosis Date  . CHF (congestive heart failure) (HCC)   . COPD (chronic obstructive pulmonary disease) (HCC)   . Diabetes mellitus without complication (HCC)   . Dyspnea   . Hypertension    Past Surgical History:  Procedure Laterality Date  .  NO PAST SURGERIES      No Known Allergies  Allergies as of 08/25/2020   No Known Allergies     Medication List       Accurate as of  August 25, 2020 10:18 AM. If you have any questions, ask your nurse or doctor.        acetaminophen 500 MG tablet Commonly known as: TYLENOL Take 1,000 mg by mouth daily as needed for mild pain or headache (or headaches).   albuterol 108 (90 Base) MCG/ACT inhaler Commonly known as: VENTOLIN HFA Inhale 2 puffs into the lungs every 4 (four) hours as needed for wheezing or shortness of breath. What changed: Another medication with the same name was removed. Continue taking this medication, and follow the directions you see here. Changed by: Octavia Heir, NP   apixaban 5 MG Tabs tablet Commonly known as: ELIQUIS Take 1 tablet (5 mg total) by mouth 2 (two) times daily.   budesonide 0.5 MG/2ML nebulizer solution Commonly known as: PULMICORT Take 2 mLs (0.5 mg total) by nebulization 2 (two) times daily.   Ensure Take 237 mLs by mouth in the morning and at bedtime.   guaifenesin 100 MG/5ML syrup Commonly known as: ROBITUSSIN Take 100 mg by mouth at bedtime as needed (for chest congestion).   ipratropium-albuterol 0.5-2.5 (3) MG/3ML Soln Commonly known as: DUONEB Take 3 mLs by nebulization every 4 (four) hours as needed.   lactulose 10 GM/15ML solution Commonly known as: CHRONULAC Take 30 mLs (20 g total) by mouth 2 (two) times daily.   losartan 100 MG tablet Commonly known as: COZAAR Take 1 tablet (100 mg total) by mouth daily.   multivitamin with minerals Tabs tablet Take 1 tablet by mouth daily.   potassium chloride SA 20 MEQ tablet Commonly known as: KLOR-CON Take 2 tablets (40 mEq total) by mouth daily.   spironolactone 25 MG tablet Commonly known as: ALDACTONE Take 1 tablet (25 mg total) by mouth daily.   Stiolto Respimat 2.5-2.5 MCG/ACT Aers Generic drug: Tiotropium Bromide-Olodaterol Inhale 1 puff into the lungs in the morning and at bedtime.   tamsulosin 0.4 MG Caps capsule Commonly known as: FLOMAX Take 1 capsule (0.4 mg total) by mouth daily.   torsemide 20  MG tablet Commonly known as: DEMADEX Take 1 tablet (20 mg total) by mouth daily.       Review of Systems  Constitutional: Negative for activity change, appetite change and fever.  HENT: Negative for congestion, hearing loss and trouble swallowing.   Eyes: Negative for visual disturbance.       Glasses  Respiratory: Negative for cough, shortness of breath and wheezing.   Cardiovascular: Negative for chest pain and leg swelling.  Gastrointestinal: Negative for abdominal distention, abdominal pain, constipation, diarrhea and nausea.  Genitourinary: Negative for frequency and hematuria.       Indwelling foley  Musculoskeletal: Negative for arthralgias and myalgias.  Skin:       Dry skin  Neurological: Positive for weakness. Negative for dizziness, numbness and headaches.  Hematological: Bruises/bleeds easily.  Psychiatric/Behavioral: Negative for confusion and dysphoric mood. The patient is not nervous/anxious.     Immunization History  Administered Date(s) Administered  . Influenza-Unspecified 03/15/2018  . PFIZER Comirnaty(Gray Top)Covid-19 Tri-Sucrose Vaccine 08/14/2020  . PFIZER(Purple Top)SARS-COV-2 Vaccination 10/04/2019, 10/25/2019  . Pneumococcal Conjugate-13 03/15/2018   Pertinent  Health Maintenance Due  Topic Date Due  . FOOT EXAM  Never done  . OPHTHALMOLOGY EXAM  Never done  . COLONOSCOPY (Pts 45-8yrs  Insurance coverage will need to be confirmed)  Never done  . PNA vac Low Risk Adult (2 of 2 - PPSV23) 03/16/2019  . INFLUENZA VACCINE  01/20/2020  . HEMOGLOBIN A1C  02/02/2021   No flowsheet data found. Functional Status Survey:    Vitals:   08/25/20 1009  BP: (!) 145/82  Pulse: 82  Resp: 18  Temp: (!) 97.5 F (36.4 C)  SpO2: 96%  Weight: 167 lb 12.8 oz (76.1 kg)  Height: 6' (1.829 m)   Body mass index is 22.76 kg/m. Physical Exam Vitals reviewed.  Constitutional:      General: He is not in acute distress. HENT:     Head: Normocephalic.     Right  Ear: There is no impacted cerumen.     Left Ear: There is no impacted cerumen.     Nose: Nose normal.     Mouth/Throat:     Mouth: Mucous membranes are moist.  Eyes:     General:        Right eye: No discharge.        Left eye: No discharge.  Cardiovascular:     Rate and Rhythm: Normal rate and regular rhythm.     Pulses: Normal pulses.     Heart sounds: Normal heart sounds. No murmur heard.   Pulmonary:     Effort: Pulmonary effort is normal. No respiratory distress.     Breath sounds: Normal breath sounds. No wheezing.     Comments: Clubbing fingers present Abdominal:     General: Abdomen is flat. Bowel sounds are normal. There is no distension.     Palpations: Abdomen is soft.     Tenderness: There is no abdominal tenderness.  Genitourinary:    Comments: Indwelling foley in place, clear yellow urine in collection bag.  Musculoskeletal:     Cervical back: Normal range of motion.     Right lower leg: Edema present.     Left lower leg: Edema present.     Comments: Non-pitting  Lymphadenopathy:     Cervical: No cervical adenopathy.  Skin:    General: Skin is warm and dry.     Capillary Refill: Capillary refill takes less than 2 seconds.  Neurological:     General: No focal deficit present.     Mental Status: He is alert and oriented to person, place, and time.     Motor: Weakness present.     Gait: Gait abnormal.     Comments: walker  Psychiatric:        Mood and Affect: Mood normal.        Behavior: Behavior normal.     Labs reviewed: Recent Labs    08/09/20 1835 08/10/20 0546 08/10/20 1602 08/11/20 0033 08/13/20 0326 08/13/20 1044 08/14/20 0340 08/15/20 0255 08/16/20 0750  NA  --  141  --    < > 141   < > 131* 133* 131*  K  --  3.3*  --    < > 2.6*   < > 4.4 3.1* 3.4*  CL  --  101  --    < > 93*   < > 94* 92* 97*  CO2  --  31  --    < > 38*   < > 30 31 25   GLUCOSE  --  140*  --    < > 57*   < > 92 99 90  BUN  --  48*  --    < > 37*   < >  31* 34* 30*   CREATININE  --  1.18  --    < > 1.08   < > 0.82 1.09 0.91  CALCIUM  --  8.5*  --    < > 9.1   < > 8.7* 8.8* 8.7*  MG 2.2 2.3 2.6*  --  2.1  --   --   --   --   PHOS 3.5 4.3 5.0*  --   --   --   --   --   --    < > = values in this interval not displayed.   Recent Labs    08/10/20 0546 08/11/20 0033 08/13/20 0326  AST ALT ALKPHOS 50 57 59  BILITOT 2.0* 1.8* 2.5*  PROT 6.9 7.7 7.2  ALBUMIN 2.8* 3.1* 2.9*   Recent Labs    08/13/20 0326 08/14/20 0813 08/15/20 0959  WBC 5.9 4.7 4.1  HGB 16.7 16.3 16.4  HCT 53.1* 52.0 51.9  MCV 99.6 98.5 96.1  PLT 156 154 152   Lab Results  Component Value Date   TSH 0.858 08/06/2020   Lab Results  Component Value Date   HGBA1C 6.2 (H) 08/05/2020   No results found for: CHOL, HDL, LDLCALC, LDLDIRECT, TRIG, CHOLHDL  Significant Diagnostic Results in last 30 days:  DG Chest 2 View  Result Date: 08/04/2020 CLINICAL DATA:  Shortness of breath, leg swelling, decreased O2 sats. EXAM: CHEST - 2 VIEW COMPARISON:  10/11/2013. FINDINGS: Trachea is midline. Heart is enlarged. Thoracic aorta is calcified. Mild basilar predominant interstitial prominence and indistinctness with bibasilar atelectasis and small bilateral pleural effusions. IMPRESSION: Mild congestive heart failure. Electronically Signed   By: Leanna Battles M.D.   On: 08/04/2020 15:03   CT ANGIO CHEST PE W OR WO CONTRAST  Result Date: 08/05/2020 CLINICAL DATA:  Leg swelling for two days and shortness of breath. EXAM: CT ANGIOGRAPHY CHEST WITH CONTRAST TECHNIQUE: Multidetector CT imaging of the chest was performed using the standard protocol during bolus administration of intravenous contrast. Multiplanar CT image reconstructions and MIPs were obtained to evaluate the vascular anatomy. CONTRAST:  OMNIPAQUE IOHEXOL 350 MG/ML SOLN COMPARISON:  Same day chest radiograph and report from CT chest dated 08/27/2001. FINDINGS: Cardiovascular: Motion artifact limits the  sensitivity and specificity of the exam. Given this limitation, there is satisfactory opacification of the pulmonary arteries to the segmental level. Several subsegmental filling defects are seen in the pulmonary arteries supplying the bilateral upper and the left lower lobes, consistent with pulmonary emboli. The main pulmonary artery is dilated, measuring 3.9 cm in diameter, consistent with pulmonary hypertension. Vascular calcifications are seen in the aortic arch. The heart is mildly enlarged. No pericardial effusion. Mediastinum/Nodes: No enlarged mediastinal, hilar, or axillary lymph nodes. Thyroid gland, trachea, and esophagus demonstrate no significant findings. Lungs/Pleura: Aspirated debris and bronchial wall thickening is seen in the right lower lobe. There is no pneumothorax on either side. There are moderate bilateral pleural effusions, right greater than left, with associated atelectasis. Upper Abdomen: No acute abnormality. Musculoskeletal: Degenerative changes are seen in the spine. Review of the MIP images confirms the above findings. IMPRESSION: 1. Several subsegmental pulmonary emboli in the pulmonary arteries supplying the bilateral upper lobes and the left lower lobe. 2. Moderate bilateral pleural effusions, right greater than left, with associated atelectasis. 3. Aspirated debris and bronchial wall thickening in the right lower lobe likely reflect chronic aspiration. 4. Enlarged pulmonary artery likely representing pulmonary hypertension. Mild  cardiomegaly. Aortic Atherosclerosis (ICD10-I70.0). These results were called by telephone at the time of interpretation on 08/05/2020 at 8:12 pm to provider Joen Laura, who verbally acknowledged these results. Electronically Signed   By: Romona Curls M.D.   On: 08/05/2020 20:17   ECHOCARDIOGRAM COMPLETE  Result Date: 08/05/2020    ECHOCARDIOGRAM REPORT   Patient Name:   DAMEIR GENTZLER Date of Exam: 08/05/2020 Medical Rec #:  [id number]        Height:       73.0 in Accession #:    [id number]      Weight:       180.6 lb Date of Birth:  18-Aug-1950        BSA:          2.060 m Patient Age:    70 years        BP:           143/76 mmHg Patient Gender: M               HR:           87 bpm. [facility] Procedure: 2D Echo Indications:    CHF-Acute Diastolic I50.31  History:        Patient has prior history of Echocardiogram examinations, most                 recent 07/16/2009. CHF, COPD; Risk Factors:Diabetes, Hypertension                 and Current Smoker. ETOH.  Sonographer:    Jeryl Columbia Referring Phys: [id number] MOHAMMAD L GARBA IMPRESSIONS  1. Left ventricular ejection fraction, by estimation, is 55 to 60%. The left ventricle has normal function. The left ventricle has no regional wall motion abnormalities. There is mild concentric left ventricular hypertrophy. Left ventricular diastolic parameters were normal. There is the interventricular septum is flattened in systole, consistent with right ventricular pressure overload.  2. Right ventricular systolic function is normal. The right ventricular size is severely enlarged. Moderately increased right ventricular wall thickness. There is mildly elevated pulmonary artery systolic pressure. The estimated right ventricular systolic pressure is 44.4 mmHg (suspect this is an underestimation due to the faint TR jet).  3. Right atrial size was severely dilated.  4. The mitral valve is normal in structure. No evidence of mitral valve regurgitation. No evidence of mitral stenosis.  5. The aortic valve is tricuspid. Aortic valve regurgitation is not visualized. No aortic stenosis is present.  6. The inferior vena cava is dilated in size with <50% respiratory variability, suggesting right atrial pressure of 15 mmHg. Comparison(s): Prior images unable to be directly viewed, comparison made by report only. FINDINGS  Left Ventricle: Left ventricular ejection fraction, by estimation, is 55 to 60%. The left  ventricle has normal function. The left ventricle has no regional wall motion abnormalities. The left ventricular internal cavity size was normal in size. There is  mild concentric left ventricular hypertrophy. The interventricular septum is flattened in systole, consistent with right ventricular pressure overload. Left ventricular diastolic parameters were normal. Indeterminate filling pressures. Right Ventricle: The right ventricular size is severely enlarged. Moderately increased right ventricular wall thickness. Right ventricular systolic function is normal. There is mildly elevated pulmonary artery systolic pressure. The tricuspid regurgitant  velocity is 2.71 m/s, and with an assumed right atrial pressure of 15 mmHg, the estimated right ventricular systolic pressure is 44.4 mmHg. Left Atrium: Left atrial size was normal in size. Right  Atrium: Right atrial size was severely dilated. Pericardium: There is no evidence of pericardial effusion. Mitral Valve: The mitral valve is normal in structure. No evidence of mitral valve regurgitation. No evidence of mitral valve stenosis. Tricuspid Valve: The tricuspid valve is normal in structure. Tricuspid valve regurgitation is mild . No evidence of tricuspid stenosis. Aortic Valve: The aortic valve is tricuspid. Aortic valve regurgitation is not visualized. No aortic stenosis is present. Pulmonic Valve: The pulmonic valve was normal in structure. Pulmonic valve regurgitation is trivial. No evidence of pulmonic stenosis. Aorta: The aortic root is normal in size and structure. Venous: The inferior vena cava is dilated in size with less than 50% respiratory variability, suggesting right atrial pressure of 15 mmHg. IAS/Shunts: There is left bowing of the interatrial septum, suggestive of elevated right atrial pressure. No atrial level shunt detected by color flow Doppler.  LEFT VENTRICLE PLAX 2D LVIDd:         4.20 cm  Diastology LVIDs:         2.80 cm  LV e' medial:    8.05  cm/s LV PW:         1.20 cm  LV E/e' medial:  13.2 LV IVS:        1.40 cm  LV e' lateral:   9.14 cm/s LVOT diam:     2.00 cm  LV E/e' lateral: 11.6 LVOT Area:     3.14 cm  RIGHT VENTRICLE RV S prime:     10.70 cm/s TAPSE (M-mode): 2.0 cm LEFT ATRIUM           Index       RIGHT ATRIUM           Index LA diam:      3.60 cm 1.75 cm/m  RA Area:     18.70 cm LA Vol (A4C): 62.1 ml 30.14 ml/m RA Volume:   53.60 ml  26.02 ml/m   AORTA Ao Root diam: 3.40 cm MITRAL VALVE                TRICUSPID VALVE MV Area (PHT): 5.54 cm     TR Peak grad:   29.4 mmHg MV Decel Time: 137 msec     TR Vmax:        271.00 cm/s MV E velocity: 106.00 cm/s MV A velocity: 84.80 cm/s   SHUNTS MV E/A ratio:  1.25         Systemic Diam: 2.00 cm Rachelle Hora Croitoru MD Electronically signed by Thurmon Fair MD Signature Date/Time: 08/05/2020/10:25:53 AM    Final     Assessment/Plan 1. Acute on chronic diastolic congestive heart failure (HCC) - no recent weight fluctuation or sob - cont torsemide and spironolactone - educated on low sodium/heart healthy diet  2. Chronic obstructive pulmonary disease, unspecified COPD type (HCC) - followed by pulmonary- f/u needed - does not require oxygen, clubbing present  - cont prn albuterol and nebs, pulmicort, and respimat  3. Essential hypertension - bp at goal, cont losartan  4. Acute pulmonary embolism with acute cor pulmonale, unspecified pulmonary embolism type (HCC) - no chest pain or sob - cont eliquis 5 mg po bid - will need pulmonary f/u to determine long term anticoagulation  5. Nondependent alcohol abuse, continuous drinking behavior - history of hepatitis C infection - GI follow up advised  6. Hyperammonemia (HCC) - GI follow up advised  7. Malnutrition of moderate degree - admits to eating fast food and alcohol use - appetite fair -  cont ensure  8. Indwelling foley catheter present - urinary obstruction during hospitalization - failed voiding trial - patient reports  f/u with urology 03/09  Family/ staff Communication: Plan discussed with patient and facility nurse  Labs/tests ordered:  none

## 2020-08-28 ENCOUNTER — Non-Acute Institutional Stay (INDEPENDENT_AMBULATORY_CARE_PROVIDER_SITE_OTHER): Payer: Self-pay | Admitting: Orthopedic Surgery

## 2020-08-28 ENCOUNTER — Encounter: Payer: Self-pay | Admitting: Orthopedic Surgery

## 2020-08-28 DIAGNOSIS — E7220 Disorder of urea cycle metabolism, unspecified: Secondary | ICD-10-CM

## 2020-08-28 DIAGNOSIS — E119 Type 2 diabetes mellitus without complications: Secondary | ICD-10-CM

## 2020-08-28 DIAGNOSIS — E440 Moderate protein-calorie malnutrition: Secondary | ICD-10-CM

## 2020-08-28 DIAGNOSIS — J449 Chronic obstructive pulmonary disease, unspecified: Secondary | ICD-10-CM

## 2020-08-28 DIAGNOSIS — I2609 Other pulmonary embolism with acute cor pulmonale: Secondary | ICD-10-CM

## 2020-08-28 DIAGNOSIS — Z978 Presence of other specified devices: Secondary | ICD-10-CM

## 2020-08-28 DIAGNOSIS — I5033 Acute on chronic diastolic (congestive) heart failure: Secondary | ICD-10-CM

## 2020-08-28 DIAGNOSIS — I10 Essential (primary) hypertension: Secondary | ICD-10-CM

## 2020-08-28 DIAGNOSIS — F1010 Alcohol abuse, uncomplicated: Secondary | ICD-10-CM

## 2020-08-28 MED ORDER — TORSEMIDE 20 MG PO TABS: 20.0000 mg | ORAL_TABLET | Freq: Every day | ORAL | 1 refills | Status: DC

## 2020-08-28 MED ORDER — BUDESONIDE 0.5 MG/2ML IN SUSP: 0.5000 mg | Freq: Two times a day (BID) | RESPIRATORY_TRACT | 1 refills | Status: DC

## 2020-08-28 MED ORDER — POTASSIUM CHLORIDE CRYS ER 20 MEQ PO TBCR: 40.0000 meq | EXTENDED_RELEASE_TABLET | Freq: Every day | ORAL | 1 refills | Status: DC

## 2020-08-28 MED ORDER — APIXABAN 5 MG PO TABS: 5.0000 mg | ORAL_TABLET | Freq: Two times a day (BID) | ORAL | 6 refills | Status: DC

## 2020-08-28 MED ORDER — IPRATROPIUM-ALBUTEROL 0.5-2.5 (3) MG/3ML IN SOLN: 3.0000 mL | RESPIRATORY_TRACT | 1 refills | Status: DC | PRN

## 2020-08-28 MED ORDER — SPIRONOLACTONE 25 MG PO TABS: 25.0000 mg | ORAL_TABLET | Freq: Every day | ORAL | 1 refills | Status: DC

## 2020-08-28 MED ORDER — LOSARTAN POTASSIUM 100 MG PO TABS: 100.0000 mg | ORAL_TABLET | Freq: Every day | ORAL | 0 refills | Status: DC

## 2020-08-28 MED ORDER — LACTULOSE 10 GM/15ML PO SOLN: 20.0000 g | Freq: Two times a day (BID) | ORAL | 0 refills | Status: DC

## 2020-08-28 MED ORDER — TAMSULOSIN HCL 0.4 MG PO CAPS: 0.4000 mg | ORAL_CAPSULE | Freq: Every day | ORAL | 0 refills | Status: DC

## 2020-08-28 MED ORDER — TAMSULOSIN HCL 0.4 MG PO CAPS
0.4000 mg | ORAL_CAPSULE | Freq: Every day | ORAL | 0 refills | Status: DC
Start: 2020-08-28 — End: 2020-08-28

## 2020-08-28 MED ORDER — ALBUTEROL SULFATE HFA 108 (90 BASE) MCG/ACT IN AERS
2.0000 | INHALATION_SPRAY | RESPIRATORY_TRACT | 0 refills | Status: DC | PRN
Start: 2020-08-28 — End: 2020-09-04

## 2020-08-28 NOTE — Progress Notes (Signed)
Location:    [facility] Room Number: 505/P Place of Service:  SNF (31)  Provider: Hazle Nordmann NP  PCP: Patient, No Pcp Per Patient Care Team: Patient, No Pcp Per as PCP - General (General Practice)  Extended Emergency Contact Information Primary Emergency Contact: Gamal, [place] Mobile Phone: [phone] Relation: Son Secondary Emergency Contact: Neysa Hotter [place] Home Phone: [phone] Relation: Sister  Code Status: Full Code Goals of care:  Advanced Directive information Advanced Directives 08/28/2020  Does Patient Have a Medical Advance Directive? No  Would patient like information on creating a medical advance directive? No - Patient declined     No Known Allergies  Chief Complaint  Patient presents with  . Discharge Note    Discharge Visit.    HPI:  69 y.o. male seen today for discharge evaluation.   He currently resides at [facility], seen at bedside today. PMH includes: congestive heart failure, hypertension, PE, COPD, hepatitis C, diabetes, and hyperammonemia.   He was hospitalized at [facility] 02/14-02/26 for acute exacerbation of CHF, PE and chronic liver disease. Chest x-ray suggested mild congestive failure. CT chest found several subsegmental pulmonary emboli in the pulmonary arteries supplying the bilateral upper lobes and left lower lobe. He was given lasix during his stay and switched to spironolactone and torsemide at discharge. IV heparin was given for PE, switched to eliquis prior to discharge. It has been recommended he follow up with pulmonary specialist to determine long ter anticoagulation. Bilirubin mildly elevated, follow up LFT recommended. He developed urinary retention during his stay, foley placed during hospitalization. He was discharged to snf for additional therapy.   Today, he is alert and oriented x 4, follows commands and can express needs. Very pleasant.  03/08 he was seen by urology. Foley was removed and he failed voiding trial. Foley was reinserted. He was advised to return in 1 week for another voiding trial. He continues to ambulate > 150 ft with walker. Minimal assistance with ADLs. He has had some weight gain during stay, but denies chest pain, sob or edema. Appetite fair. Remains on heart healthy diet. Last BM today.   No recent falls or injuries.   Recent blood pressures are as follows:  03/10- 112/64  03/09- 128/75  03/08- 104/64  Recent weights are as follows:  03/08- 171.3lbs  03/03- 167.8lbs  02/28- 165.2lbs   Facility nurse does not report any concerns, vitals stable.   At this time, he plans to discharge home 08/30/2020. Son will assist with transportation and care. Home health PT/OT ordered. Rolling walker ordered. He states he does not have a primary care provider. He has used the [facility] in [place] for most health services. I have advised him to follow up with [facility] or [facility] in 1-2 weeks.      Past Medical History:  Diagnosis Date  . CHF (congestive heart failure) (HCC)   . COPD (chronic obstructive pulmonary disease) (HCC)   . Diabetes mellitus without complication (HCC)   . Dyspnea   . Hypertension     Past Surgical History:  Procedure Laterality Date  . NO PAST SURGERIES        reports that he has been smoking cigarettes. He has a 60.00 pack-year smoking history. He has never used smokeless tobacco. He reports that he does not drink alcohol and does not use drugs. Social History   Socioeconomic History  . Marital status: Divorced  Spouse name: Not on file  . Number of children: Not on file  . Years of education: Not on file  . Highest education level: Not on file  Occupational History  . Not on file  Tobacco Use  . Smoking status: Current Every Day Smoker    Packs/day: 1.50    Years: 40.00    Pack years: 60.00    Types: Cigarettes  . Smokeless tobacco: Never Used   Vaping Use  . Vaping Use: Never used  Substance and Sexual Activity  . Alcohol use: No    Comment: non x 5 years   . Drug use: No  . Sexual activity: Not on file  Other Topics Concern  . Not on file  Social History Narrative  . Not on file   Social Determinants of Health   Financial Resource Strain: Not on file  Food Insecurity: Not on file  Transportation Needs: Not on file  Physical Activity: Not on file  Stress: Not on file  Social Connections: Not on file  Intimate Partner Violence: Not on file   Functional Status Survey:    No Known Allergies  Pertinent  Health Maintenance Due  Topic Date Due  . FOOT EXAM  Never done  . OPHTHALMOLOGY EXAM  Never done  . COLONOSCOPY (Pts 45-24yrs Insurance coverage will need to be confirmed)  Never done  . PNA vac Low Risk Adult (2 of 2 - PPSV23) 03/16/2019  . INFLUENZA VACCINE  01/20/2020  . HEMOGLOBIN A1C  02/02/2021    Medications: Allergies as of 08/28/2020   No Known Allergies     Medication List       Accurate as of August 28, 2020 12:57 PM. If you have any questions, ask your nurse or doctor.        acetaminophen 500 MG tablet Commonly known as: TYLENOL Take 1,000 mg by mouth every 8 (eight) hours as needed for mild pain or headache (or headaches).   albuterol 108 (90 Base) MCG/ACT inhaler Commonly known as: VENTOLIN HFA Inhale 2 puffs into the lungs every 4 (four) hours as needed for wheezing or shortness of breath.   apixaban 5 MG Tabs tablet Commonly known as: ELIQUIS Take 1 tablet (5 mg total) by mouth 2 (two) times daily.   budesonide 0.5 MG/2ML nebulizer solution Commonly known as: PULMICORT Take 2 mLs (0.5 mg total) by nebulization 2 (two) times daily.   Ensure Take 237 mLs by mouth in the morning and at bedtime.   guaifenesin 100 MG/5ML syrup Commonly known as: ROBITUSSIN Take 100 mg by mouth at bedtime as needed (for chest congestion).   ipratropium-albuterol 0.5-2.5 (3) MG/3ML Soln Commonly  known as: DUONEB Take 3 mLs by nebulization every 4 (four) hours as needed.   lactulose 10 GM/15ML solution Commonly known as: CHRONULAC Take 30 mLs (20 g total) by mouth 2 (two) times daily.   losartan 100 MG tablet Commonly known as: COZAAR Take 1 tablet (100 mg total) by mouth daily.   multivitamin with minerals Tabs tablet Take 1 tablet by mouth daily.   potassium chloride SA 20 MEQ tablet Commonly known as: KLOR-CON Take 2 tablets (40 mEq total) by mouth daily.   spironolactone 25 MG tablet Commonly known as: ALDACTONE Take 1 tablet (25 mg total) by mouth daily.   Stiolto Respimat 2.5-2.5 MCG/ACT Aers Generic drug: Tiotropium Bromide-Olodaterol Inhale 1 puff into the lungs in the morning and at bedtime.   tamsulosin 0.4 MG Caps capsule Commonly known as: FLOMAX Take  1 capsule (0.4 mg total) by mouth daily.   torsemide 20 MG tablet Commonly known as: DEMADEX Take 1 tablet (20 mg total) by mouth daily.       Review of Systems  Constitutional: Negative for activity change, appetite change and fever.  HENT: Negative for dental problem, hearing loss and trouble swallowing.   Eyes: Positive for visual disturbance.       Glasses  Respiratory: Negative for cough, shortness of breath and wheezing.   Cardiovascular: Negative for chest pain and leg swelling.  Gastrointestinal: Negative for abdominal distention, abdominal pain, constipation, diarrhea and nausea.  Genitourinary: Negative for hematuria.       Indwelling foley  Musculoskeletal: Positive for arthralgias and myalgias.  Skin:       Dry skin  Neurological: Positive for weakness. Negative for dizziness and headaches.  Hematological: Bruises/bleeds easily.  Psychiatric/Behavioral: Negative for confusion and dysphoric mood. The patient is not nervous/anxious.     Vitals:   08/28/20 1157  BP: 135/77  Pulse: 77  Resp: 18  Temp: (!) 97.1 F (36.2 C)  SpO2: 97%  Height: 6' (1.829 m)   Body mass index is  22.76 kg/m. Physical Exam Vitals reviewed.  Constitutional:      General: He is not in acute distress.    Appearance: Normal appearance.  HENT:     Head: Normocephalic.     Right Ear: There is no impacted cerumen.     Left Ear: There is no impacted cerumen.     Nose: Nose normal.     Mouth/Throat:     Mouth: Mucous membranes are moist.  Eyes:     General:        Right eye: No discharge.        Left eye: No discharge.  Cardiovascular:     Rate and Rhythm: Normal rate and regular rhythm.     Pulses: Normal pulses.     Heart sounds: Normal heart sounds. No murmur heard.   Pulmonary:     Effort: Pulmonary effort is normal. No respiratory distress.     Breath sounds: Normal breath sounds. No wheezing.  Abdominal:     General: Abdomen is flat. Bowel sounds are normal. There is no distension.     Palpations: Abdomen is soft.     Tenderness: There is no abdominal tenderness.  Genitourinary:    Comments: Foley cath in place, leg bag strapped to leg. Urine clear and yellow.  Musculoskeletal:     Cervical back: Normal range of motion. No rigidity.     Right lower leg: No edema.     Left lower leg: No edema.  Skin:    General: Skin is warm and dry.     Capillary Refill: Capillary refill takes less than 2 seconds.  Neurological:     General: No focal deficit present.     Mental Status: He is alert and oriented to person, place, and time.     Motor: Weakness present.     Gait: Gait abnormal.     Comments: walker  Psychiatric:        Mood and Affect: Mood normal.        Behavior: Behavior normal.     Labs reviewed: Basic Metabolic Panel: Recent Labs    08/09/20 1835 08/10/20 0546 08/10/20 1602 08/11/20 0033 08/13/20 0326 08/13/20 1044 08/14/20 0340 08/15/20 0255 08/16/20 0750  NA  --  141  --    < > 141   < > 131* 133* 131*  K  --  3.3*  --    < > 2.6*   < > 4.4 3.1* 3.4*  CL  --  101  --    < > 93*   < > 94* 92* 97*  CO2  --  31  --    < > 38*   < > 30 31 25    GLUCOSE  --  140*  --    < > 57*   < > 92 99 90  BUN  --  48*  --    < > 37*   < > 31* 34* 30*  CREATININE  --  1.18  --    < > 1.08   < > 0.82 1.09 0.91  CALCIUM  --  8.5*  --    < > 9.1   < > 8.7* 8.8* 8.7*  MG 2.2 2.3 2.6*  --  2.1  --   --   --   --   PHOS 3.5 4.3 5.0*  --   --   --   --   --   --    < > = values in this interval not displayed.   Liver Function Tests: Recent Labs    08/10/20 0546 08/11/20 0033 08/13/20 0326  AST 18 22 19   ALT 12 14 13   ALKPHOS 50 57 59  BILITOT 2.0* 1.8* 2.5*  PROT 6.9 7.7 7.2  ALBUMIN 2.8* 3.1* 2.9*   No results for input(s): LIPASE, AMYLASE in the last 8760 hours. Recent Labs    08/07/20 0733 08/08/20 0321 08/12/20 0853  AMMONIA 75* 30 30   CBC: Recent Labs    08/13/20 0326 08/14/20 0813 08/15/20 0959  WBC 5.9 4.7 4.1  HGB 16.7 16.3 16.4  HCT 53.1* 52.0 51.9  MCV 99.6 98.5 96.1  PLT 156 154 152   Cardiac Enzymes: No results for input(s): CKTOTAL, CKMB, CKMBINDEX, TROPONINI in the last 8760 hours. BNP: Invalid input(s): POCBNP CBG: Recent Labs    08/16/20 0817 08/16/20 1110 08/16/20 1539  GLUCAP 121* 111* 118*    Procedures and Imaging Studies During Stay: CT ABDOMEN PELVIS WO CONTRAST  Result Date: 08/09/2020 CLINICAL DATA:  Abdominal distension.  Acute abdominal pain EXAM: CT ABDOMEN AND PELVIS WITHOUT CONTRAST TECHNIQUE: Multidetector CT imaging of the abdomen and pelvis was performed following the standard protocol without IV contrast. COMPARISON:  None similar and available. Right upper quadrant ultrasound from 2 days ago. FINDINGS: Lower chest:  Lower lobe atelectasis with small pleural effusions. Hepatobiliary: No focal liver abnormality.Numerous calcified gallstones. No focal pericholecystic inflammation. Pancreas: Unremarkable. Spleen: Unremarkable. Adrenals/Urinary Tract: Negative adrenals. Mild bilateral hydroureteronephrosis above a distended urinary bladder. Right renal cysts which have a simple appearance  by CT, up to 5.7 cm from the right lower pole. Unremarkable bladder. Stomach/Bowel: No obstruction or ileus. No visible bowel inflammation. There is an enteric tube with tip at the mid stomach. Vascular/Lymphatic: Extensive atheromatous calcification. No mass or adenopathy. Reproductive:No acute finding. Other: No ascites or pneumoperitoneum. Anasarca. Small umbilical hernia containing fat. Musculoskeletal: No acute abnormalities. Diffuse facet osteoarthritis. L4-5 anterolisthesis. IMPRESSION: 1. Distended urinary bladder reaching the umbilicus and causing bilateral hydroureteronephrosis. 2.  Aortic Atherosclerosis (ICD10-I70.0). 3. Anasarca. 4. Lower lobe atelectasis and small pleural effusions. 5. Cholelithiasis. Electronically Signed   By: Marnee Spring M.D.   On: 08/09/2020 07:23   DG Chest 2 View  Result Date: 08/04/2020 CLINICAL DATA:  Shortness of breath, leg swelling, decreased O2 sats. EXAM: CHEST - 2 VIEW  COMPARISON:  10/11/2013. FINDINGS: Trachea is midline. Heart is enlarged. Thoracic aorta is calcified. Mild basilar predominant interstitial prominence and indistinctness with bibasilar atelectasis and small bilateral pleural effusions. IMPRESSION: Mild congestive heart failure. Electronically Signed   By: Leanna Battles M.D.   On: 08/04/2020 15:03   DG Abd 1 View  Result Date: 08/12/2020 CLINICAL DATA:  Ileus. EXAM: ABDOMEN - 1 VIEW COMPARISON:  08/11/2020. FINDINGS: NG tube has been removed. Persistent but improved slightly dilated loops of small bowel noted. Colonic gas pattern is normal. No free air identified. Degenerative changes lumbar spine and both hips. Aortoiliac atherosclerotic vascular calcification. Pelvic calcifications consistent phleboliths. Left base atelectasis/infiltrate. IMPRESSION: 1. NG tube has been removed. Persistent but improved slightly dilated loops of small bowel noted. Colonic gas pattern is normal. 2. Left base atelectasis/infiltrate. Electronically Signed   By:  Maisie Fus  Register   On: 08/12/2020 06:38   DG Abd 1 View  Result Date: 08/11/2020 CLINICAL DATA:  Nasogastric tube placement. EXAM: ABDOMEN - 1 VIEW COMPARISON:  Same day. FINDINGS: Nasogastric tube tip is seen in proximal stomach. Stable mild small bowel dilatation is noted without colonic dilatation. IMPRESSION: Nasogastric tube tip seen in proximal stomach. Stable mild small bowel dilatation. Electronically Signed   By: Lupita Raider M.D.   On: 08/11/2020 11:57   DG Abd 1 View  Result Date: 08/11/2020 CLINICAL DATA:  Abdominal distension, pain EXAM: ABDOMEN - 1 VIEW COMPARISON:  None. FINDINGS: Air-filled loops of mildly dilated small bowel measuring up to 4.2 cm. Air is present within nondistended colon. No large volume free air on this supine study. IMPRESSION: Mildly dilated loops of small bowel may reflect small bowel obstruction or ileus. Electronically Signed   By: Guadlupe Spanish M.D.   On: 08/11/2020 10:10   CT HEAD WO CONTRAST  Result Date: 08/07/2020 CLINICAL DATA:  Mental status change with unknown cause EXAM: CT HEAD WITHOUT CONTRAST TECHNIQUE: Contiguous axial images were obtained from the base of the skull through the vertex without intravenous contrast. COMPARISON:  04/20/2017 FINDINGS: Brain: No evidence of acute infarction, hemorrhage, hydrocephalus, extra-axial collection or mass lesion/mass effect. Vermian atrophy. There is history of alcohol abuse. Vascular: No hyperdense vessel or unexpected calcification. Skull: Normal. Negative for fracture or focal lesion. Sinuses/Orbits: No acute finding. 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 Electronically Signed   By: Marnee Spring M.D.   On: 08/07/2020 04:44   CT ANGIO CHEST PE W OR WO CONTRAST  Result Date: 08/05/2020 CLINICAL DATA:  Leg swelling for two days and shortness of breath. EXAM: CT ANGIOGRAPHY CHEST WITH CONTRAST TECHNIQUE:  Multidetector CT imaging of the chest was performed using the standard protocol during bolus administration of intravenous contrast. Multiplanar CT image reconstructions and MIPs were obtained to evaluate the vascular anatomy. CONTRAST:  OMNIPAQUE IOHEXOL 350 MG/ML SOLN COMPARISON:  Same day chest radiograph and report from CT chest dated 08/27/2001. FINDINGS: Cardiovascular: Motion artifact limits the sensitivity and specificity of the exam. Given this limitation, there is satisfactory opacification of the pulmonary arteries to the segmental level. Several subsegmental filling defects are seen in the pulmonary arteries supplying the bilateral upper and the left lower lobes, consistent with pulmonary emboli. The main pulmonary artery is dilated, measuring 3.9 cm in diameter, consistent with pulmonary hypertension. Vascular calcifications are seen in the aortic arch. The heart is mildly enlarged. No pericardial effusion. Mediastinum/Nodes: No enlarged mediastinal, hilar, or axillary lymph nodes. Thyroid  gland, trachea, and esophagus demonstrate no significant findings. Lungs/Pleura: Aspirated debris and bronchial wall thickening is seen in the right lower lobe. There is no pneumothorax on either side. There are moderate bilateral pleural effusions, right greater than left, with associated atelectasis. Upper Abdomen: No acute abnormality. Musculoskeletal: Degenerative changes are seen in the spine. Review of the MIP images confirms the above findings. IMPRESSION: 1. Several subsegmental pulmonary emboli in the pulmonary arteries supplying the bilateral upper lobes and the left lower lobe. 2. Moderate bilateral pleural effusions, right greater than left, with associated atelectasis. 3. Aspirated debris and bronchial wall thickening in the right lower lobe likely reflect chronic aspiration. 4. Enlarged pulmonary artery likely representing pulmonary hypertension. Mild cardiomegaly. Aortic Atherosclerosis  (ICD10-I70.0). These results were called by telephone at the time of interpretation on 08/05/2020 at 8:12 pm to provider Joen Laura, who verbally acknowledged these results. Electronically Signed   By: Romona Curls M.D.   On: 08/05/2020 20:17   DG CHEST PORT 1 VIEW  Result Date: 08/13/2020 CLINICAL DATA:  Shortness of breath. EXAM: PORTABLE CHEST 1 VIEW COMPARISON:  Chest x-ray 08/11/2020, 08/08/2020. FINDINGS: Cardiomegaly with pulmonary venous congestion. Mild interstitial prominence again noted with interim improvement from prior exam. Findings suggest improving interstitial edema. No pleural effusion or pneumothorax. Degenerative changes scoliosis thoracic spine. Carotid vascular calcification. IMPRESSION: 1. Cardiomegaly with pulmonary venous congestion. Mild interstitial prominence again noted with interim improvement from prior exam. Findings suggest improving interstitial edema. 2.  Carotid vascular disease. Electronically Signed   By: Maisie Fus  Register   On: 08/13/2020 12:47   DG Chest Port 1 View  Result Date: 08/11/2020 CLINICAL DATA:  Pulmonary edema EXAM: PORTABLE CHEST 1 VIEW COMPARISON:  Yesterday FINDINGS: Esophageal extubation. Hazy bilateral pulmonary opacity from pleural effusions and atelectasis by most recent CTs. Cardiomegaly. No visible pneumothorax. IMPRESSION: Unchanged hazy chest opacification correlating with atelectasis and pleural fluid on recent CT. Electronically Signed   By: Marnee Spring M.D.   On: 08/11/2020 06:13   DG Chest Port 1 View  Result Date: 08/10/2020 CLINICAL DATA:  Acute respiratory failure. EXAM: PORTABLE CHEST 1 VIEW COMPARISON:  08/08/2020 FINDINGS: There is an NG tube with tip and side port below GE junction. Aortic atherosclerosis. Stable cardiomediastinal contours. Persistent diffuse increased interstitial opacities in both lungs. IMPRESSION: 1. Unchanged diffuse increase interstitial opacities within both lungs Electronically Signed   By:  Signa Kell M.D.   On: 08/10/2020 07:37   DG CHEST PORT 1 VIEW  Result Date: 08/08/2020 CLINICAL DATA:  Intubation EXAM: PORTABLE CHEST 1 VIEW COMPARISON:  Portable exam 1646 hours compared to 08/08/2020 FINDINGS: No endotracheal tube tip projecting 5.8 cm above carina. Nasogastric tube extends into stomach. Normal heart size and mediastinal contours. Atherosclerotic calcification aorta. Pulmonary vascular congestion. Pulmonary infiltrates are identified in the mid to lower lungs bilaterally, slightly increased since previous exam, question pulmonary edema versus multifocal infection. RIGHT pleural effusion. No pneumothorax. IMPRESSION: Slightly increased pulmonary infiltrates in mid to lower lungs greater on RIGHT which could represent edema or infection. RIGHT pleural effusion. Endotracheal nasogastric tube positions as above. Electronically Signed   By: Ulyses Southward M.D.   On: 08/08/2020 17:04   DG CHEST PORT 1 VIEW  Result Date: 08/08/2020 CLINICAL DATA:  Altered mental status. EXAM: PORTABLE CHEST 1 VIEW COMPARISON:  CT chest dated August 05, 2020. Chest x-ray dated August 04, 2020. FINDINGS: The left costophrenic angle is excluded from the field of view. Unchanged mild cardiomegaly and pulmonary vascular congestion. Decreased small  left and unchanged small right pleural effusions with unchanged mild right basilar atelectasis. Improving aeration at the left lung base. No pneumothorax. No acute osseous abnormality. IMPRESSION: 1. Decreased small left and unchanged small right pleural effusions with bibasilar atelectasis. Electronically Signed   By: Obie Dredge M.D.   On: 08/08/2020 10:25   ECHOCARDIOGRAM COMPLETE  Result Date: 08/05/2020    ECHOCARDIOGRAM REPORT   Patient Name:   AZZAM MEHRA Date of Exam: 08/05/2020 Medical Rec #:  [id number]       Height:       73.0 in Accession #:    [id number]      Weight:       180.6 lb Date of Birth:  03/20/1951        BSA:          2.060 m Patient  Age:    70 years        BP:           143/76 mmHg Patient Gender: M               HR:           87 bpm. [facility] Procedure: 2D Echo Indications:    CHF-Acute Diastolic I50.31  History:        Patient has prior history of Echocardiogram examinations, most                 recent 07/16/2009. CHF, COPD; Risk Factors:Diabetes, Hypertension                 and Current Smoker. ETOH.  Sonographer:    Jeryl Columbia Referring Phys: [id number] MOHAMMAD L GARBA IMPRESSIONS  1. Left ventricular ejection fraction, by estimation, is 55 to 60%. The left ventricle has normal function. The left ventricle has no regional wall motion abnormalities. There is mild concentric left ventricular hypertrophy. Left ventricular diastolic parameters were normal. There is the interventricular septum is flattened in systole, consistent with right ventricular pressure overload.  2. Right ventricular systolic function is normal. The right ventricular size is severely enlarged. Moderately increased right ventricular wall thickness. There is mildly elevated pulmonary artery systolic pressure. The estimated right ventricular systolic pressure is 44.4 mmHg (suspect this is an underestimation due to the faint TR jet).  3. Right atrial size was severely dilated.  4. The mitral valve is normal in structure. No evidence of mitral valve regurgitation. No evidence of mitral stenosis.  5. The aortic valve is tricuspid. Aortic valve regurgitation is not visualized. No aortic stenosis is present.  6. The inferior vena cava is dilated in size with <50% respiratory variability, suggesting right atrial pressure of 15 mmHg. Comparison(s): Prior images unable to be directly viewed, comparison made by report only. FINDINGS  Left Ventricle: Left ventricular ejection fraction, by estimation, is 55 to 60%. The left ventricle has normal function. The left ventricle has no regional wall motion abnormalities. The left ventricular internal cavity size was normal  in size. There is  mild concentric left ventricular hypertrophy. The interventricular septum is flattened in systole, consistent with right ventricular pressure overload. Left ventricular diastolic parameters were normal. Indeterminate filling pressures. Right Ventricle: The right ventricular size is severely enlarged. Moderately increased right ventricular wall thickness. Right ventricular systolic function is normal. There is mildly elevated pulmonary artery systolic pressure. The tricuspid regurgitant  velocity is 2.71 m/s, and with an assumed right atrial pressure of 15 mmHg, the estimated right ventricular systolic pressure is 44.4  mmHg. Left Atrium: Left atrial size was normal in size. Right Atrium: Right atrial size was severely dilated. Pericardium: There is no evidence of pericardial effusion. Mitral Valve: The mitral valve is normal in structure. No evidence of mitral valve regurgitation. No evidence of mitral valve stenosis. Tricuspid Valve: The tricuspid valve is normal in structure. Tricuspid valve regurgitation is mild . No evidence of tricuspid stenosis. Aortic Valve: The aortic valve is tricuspid. Aortic valve regurgitation is not visualized. No aortic stenosis is present. Pulmonic Valve: The pulmonic valve was normal in structure. Pulmonic valve regurgitation is trivial. No evidence of pulmonic stenosis. Aorta: The aortic root is normal in size and structure. Venous: The inferior vena cava is dilated in size with less than 50% respiratory variability, suggesting right atrial pressure of 15 mmHg. IAS/Shunts: There is left bowing of the interatrial septum, suggestive of elevated right atrial pressure. No atrial level shunt detected by color flow Doppler.  LEFT VENTRICLE PLAX 2D LVIDd:         4.20 cm  Diastology LVIDs:         2.80 cm  LV e' medial:    8.05 cm/s LV PW:         1.20 cm  LV E/e' medial:  13.2 LV IVS:        1.40 cm  LV e' lateral:   9.14 cm/s LVOT diam:     2.00 cm  LV E/e' lateral:  11.6 LVOT Area:     3.14 cm  RIGHT VENTRICLE RV S prime:     10.70 cm/s TAPSE (M-mode): 2.0 cm LEFT ATRIUM           Index       RIGHT ATRIUM           Index LA diam:      3.60 cm 1.75 cm/m  RA Area:     18.70 cm LA Vol (A4C): 62.1 ml 30.14 ml/m RA Volume:   53.60 ml  26.02 ml/m   AORTA Ao Root diam: 3.40 cm MITRAL VALVE                TRICUSPID VALVE MV Area (PHT): 5.54 cm     TR Peak grad:   29.4 mmHg MV Decel Time: 137 msec     TR Vmax:        271.00 cm/s MV E velocity: 106.00 cm/s MV A velocity: 84.80 cm/s   SHUNTS MV E/A ratio:  1.25         Systemic Diam: 2.00 cm Rachelle HoraMihai Croitoru MD Electronically signed by Thurmon FairMihai Croitoru MD Signature Date/Time: 08/05/2020/10:25:53 AM    Final    VAS US LOWER EXTREMITY VENOUS (DVT)  Result Date: 08/06/2020  Lower Venous DVT Study Indications: Pulmonary embolism, SOB, bilateral swelling.  Comparison Study: 08-05-2020 CTA chest was positive for pulmonary embolism. Performing Technologist: Jean Rosenthalachel Hodge RDMS  Examination Guidelines: A complete evaluation includes B-mode imaging, spectral Doppler, color Doppler, and power Doppler as needed of all accessible portions of each vessel. Bilateral testing is considered an integral part of a complete examination. Limited examinations for reoccurring indications may be performed as noted. The reflux portion of the exam is performed with the patient in reverse Trendelenburg.  +---------+---------------+---------+-----------+----------+--------------+ RIGHT    CompressibilityPhasicitySpontaneityPropertiesThrombus Aging +---------+---------------+---------+-----------+----------+--------------+ CFV      Full           No       Yes                                 +---------+---------------+---------+-----------+----------+--------------+  SFJ      Full                                                        +---------+---------------+---------+-----------+----------+--------------+ FV Prox  Full                                                         +---------+---------------+---------+-----------+----------+--------------+ FV Mid   Full                                                        +---------+---------------+---------+-----------+----------+--------------+ FV DistalFull                                                        +---------+---------------+---------+-----------+----------+--------------+ PFV      Full                                                        +---------+---------------+---------+-----------+----------+--------------+ POP      Full           No       Yes                                 +---------+---------------+---------+-----------+----------+--------------+ PTV      Full                                                        +---------+---------------+---------+-----------+----------+--------------+ PERO     Full                                                        +---------+---------------+---------+-----------+----------+--------------+   +---------+---------------+---------+-----------+----------+--------------+ LEFT     CompressibilityPhasicitySpontaneityPropertiesThrombus Aging +---------+---------------+---------+-----------+----------+--------------+ CFV      Full           No       Yes                                 +---------+---------------+---------+-----------+----------+--------------+ SFJ      Full                                                        +---------+---------------+---------+-----------+----------+--------------+  FV Prox  Full                                                        +---------+---------------+---------+-----------+----------+--------------+ FV Mid   Full                                                        +---------+---------------+---------+-----------+----------+--------------+ FV DistalFull                                                         +---------+---------------+---------+-----------+----------+--------------+ PFV      Full                                                        +---------+---------------+---------+-----------+----------+--------------+ POP      Full           No       Yes                                 +---------+---------------+---------+-----------+----------+--------------+ PTV      Full                                                        +---------+---------------+---------+-----------+----------+--------------+ PERO     Full                                                        +---------+---------------+---------+-----------+----------+--------------+     Summary: RIGHT: - There is no evidence of deep vein thrombosis in the lower extremity.  - No cystic structure found in the popliteal fossa.  - Pulsatile waveforms consistent with elevated right heart pressures.  LEFT: - There is no evidence of deep vein thrombosis in the lower extremity.  - No cystic structure found in the popliteal fossa.  - Pulsatile waveforms consistent with elevated right heart pressures.  *See table(s) above for measurements and observations. Electronically signed by Coral Else MD on 08/06/2020 at 9:47:18 PM.    Final    US Abdomen Limited RUQ (LIVER/GB)  Result Date: 08/07/2020 CLINICAL DATA:  Elevated liver enzymes EXAM: ULTRASOUND ABDOMEN LIMITED RIGHT UPPER QUADRANT COMPARISON:  Abdominal ultrasound August 26, 2009 FINDINGS: Gallbladder: The gallbladder is contracted and filled with calculi. Largest individual gallstone measures approximately 1 cm in length. Gallbladder wall is thickened without edema or pericholecystic fluid. No sonographic Murphy sign noted by sonographer. Common bile duct: Diameter: 5 mm. No intrahepatic or extrahepatic  biliary duct dilatation. Liver: No focal lesion identified. Liver echogenicity overall is increased. Portal vein is patent on color Doppler imaging with normal direction of  blood flow towards the liver. Other: There is mild ascites.  Small right pleural effusion. IMPRESSION: 1. Gallbladder is contracted and filled with calculi. Gallbladder wall mildly thickened without gallbladder wall edema. No pericholecystic fluid. 2. Increased liver echogenicity, a finding likely indicative of hepatic steatosis, potentially with underlying parenchymal liver disease. No focal liver lesions are demonstrable on this study. 3.  Mild ascites. 4.  Small right pleural effusion. Electronically Signed   By: Bretta Bang III M.D.   On: 08/07/2020 16:03    Assessment/Plan:   1. Acute on chronic diastolic congestive heart failure (HCC) - some weight gain, but no sob or ankle edema - cont torsemide and spironolactone - recommend daily weights at home, contact provider if > 3 lbs weight gain or sob - advise low sodium diet < 2000 mg/day - cbc/diff- future -bmp- future  2. Chronic obstructive pulmonary disease, unspecified COPD type (HCC) - f/u with pulmonary needed - cont prn albuterol, nebs, pulmicort and respimat  3. Essential hypertension - bp at goal , cont losartan  4. Type 2 diabetes mellitus without complication, without long-term current use of insulin (HCC) - sugars averaging 80-130s, no hypoglycemic events - A1C 6.2 in hospital - not on meds at this time  5. Acute pulmonary embolism with acute cor pulmonale, unspecified pulmonary embolism type (HCC) - denies chest pain or sob - cont eliquis 5 mg po bid - will need pulmonary f/u to determine long term anticoagulation  6. Nondependent alcohol abuse, continuous drinking behavior - GI f/u advised  - LFT- future  7. Hyperammonemia (HCC) - GI f/u advised  8. Malnutrition of moderate degree - cont ensure - admits to poor eating and drinking habits  9. Indwelling Foley catheter present - urology f/u 03/08- failed voiding trial - cont foley, next voiding trial scheduled in 1 week    Patient is being discharged  with the following home health services:PT/OT    Patient is being discharged with the following durable medical equipment: Rolling walker   Patient has been advised to f/u with their PCP in 1-2 weeks to bring them up to date on their rehab stay.  Social services at facility was responsible for arranging this appointment.  Pt was provided with a 30 day supply of prescriptions for medications and refills must be obtained from their PCP.  For controlled substances, a more limited supply may be provided adequate until PCP appointment only.  Future labs/tests needed:  Cbc/diff, bmp, LFT

## 2020-09-04 ENCOUNTER — Other Ambulatory Visit: Payer: Self-pay | Admitting: Orthopedic Surgery

## 2020-09-04 DIAGNOSIS — I10 Essential (primary) hypertension: Secondary | ICD-10-CM

## 2020-09-04 DIAGNOSIS — R32 Unspecified urinary incontinence: Secondary | ICD-10-CM

## 2020-09-04 DIAGNOSIS — I5033 Acute on chronic diastolic (congestive) heart failure: Secondary | ICD-10-CM

## 2020-09-04 DIAGNOSIS — F1010 Alcohol abuse, uncomplicated: Secondary | ICD-10-CM

## 2020-09-04 DIAGNOSIS — J449 Chronic obstructive pulmonary disease, unspecified: Secondary | ICD-10-CM

## 2020-09-04 DIAGNOSIS — I5032 Chronic diastolic (congestive) heart failure: Secondary | ICD-10-CM

## 2020-09-04 DIAGNOSIS — I2609 Other pulmonary embolism with acute cor pulmonale: Secondary | ICD-10-CM

## 2020-09-04 DIAGNOSIS — Z978 Presence of other specified devices: Secondary | ICD-10-CM

## 2020-09-04 DIAGNOSIS — J9611 Chronic respiratory failure with hypoxia: Secondary | ICD-10-CM | POA: Diagnosis not present

## 2020-09-04 DIAGNOSIS — J9612 Chronic respiratory failure with hypercapnia: Secondary | ICD-10-CM

## 2020-09-04 DIAGNOSIS — I110 Hypertensive heart disease with heart failure: Secondary | ICD-10-CM

## 2020-09-04 DIAGNOSIS — E119 Type 2 diabetes mellitus without complications: Secondary | ICD-10-CM

## 2020-09-04 MED ORDER — LOSARTAN POTASSIUM 100 MG PO TABS: 100.0000 mg | ORAL_TABLET | Freq: Every day | ORAL | 0 refills | Status: DC

## 2020-09-04 MED ORDER — ALBUTEROL SULFATE HFA 108 (90 BASE) MCG/ACT IN AERS: 2.0000 | INHALATION_SPRAY | RESPIRATORY_TRACT | 0 refills | Status: AC | PRN

## 2020-09-04 MED ORDER — TORSEMIDE 20 MG PO TABS: 20.0000 mg | ORAL_TABLET | Freq: Every day | ORAL | 1 refills | Status: DC

## 2020-09-04 MED ORDER — POTASSIUM CHLORIDE CRYS ER 20 MEQ PO TBCR: 40.0000 meq | EXTENDED_RELEASE_TABLET | Freq: Every day | ORAL | 1 refills | Status: DC

## 2020-09-04 MED ORDER — TAMSULOSIN HCL 0.4 MG PO CAPS: 0.4000 mg | ORAL_CAPSULE | Freq: Every day | ORAL | 0 refills | Status: DC

## 2020-09-04 MED ORDER — STIOLTO RESPIMAT 2.5-2.5 MCG/ACT IN AERS
1.0000 | INHALATION_SPRAY | Freq: Two times a day (BID) | RESPIRATORY_TRACT | 0 refills | Status: DC
Start: 2020-09-04 — End: 2020-10-23

## 2020-09-04 MED ORDER — LACTULOSE 10 GM/15ML PO SOLN: 20.0000 g | Freq: Two times a day (BID) | ORAL | 0 refills | Status: DC

## 2020-09-04 MED ORDER — BUDESONIDE 0.5 MG/2ML IN SUSP: 0.5000 mg | Freq: Two times a day (BID) | RESPIRATORY_TRACT | 1 refills | Status: DC

## 2020-09-04 MED ORDER — IPRATROPIUM-ALBUTEROL 0.5-2.5 (3) MG/3ML IN SOLN: 3.0000 mL | RESPIRATORY_TRACT | 1 refills | Status: AC | PRN

## 2020-09-04 MED ORDER — SPIRONOLACTONE 25 MG PO TABS: 25.0000 mg | ORAL_TABLET | Freq: Every day | ORAL | 1 refills | Status: DC

## 2020-09-04 MED ORDER — APIXABAN 5 MG PO TABS: 5.0000 mg | ORAL_TABLET | Freq: Two times a day (BID) | ORAL | 6 refills | Status: DC

## 2020-09-12 ENCOUNTER — Other Ambulatory Visit: Payer: Self-pay

## 2020-09-12 ENCOUNTER — Encounter: Payer: Self-pay | Admitting: Orthopedic Surgery

## 2020-09-12 ENCOUNTER — Ambulatory Visit (INDEPENDENT_AMBULATORY_CARE_PROVIDER_SITE_OTHER): Payer: Medicare Other | Admitting: Orthopedic Surgery

## 2020-09-12 VITALS — BP 140/70 | HR 83 | Temp 97.7°F | Ht 73.0 in | Wt 188.6 lb

## 2020-09-12 DIAGNOSIS — I10 Essential (primary) hypertension: Secondary | ICD-10-CM

## 2020-09-12 DIAGNOSIS — I2609 Other pulmonary embolism with acute cor pulmonale: Secondary | ICD-10-CM

## 2020-09-12 DIAGNOSIS — E7220 Disorder of urea cycle metabolism, unspecified: Secondary | ICD-10-CM

## 2020-09-12 DIAGNOSIS — E119 Type 2 diabetes mellitus without complications: Secondary | ICD-10-CM

## 2020-09-12 DIAGNOSIS — I5033 Acute on chronic diastolic (congestive) heart failure: Secondary | ICD-10-CM | POA: Diagnosis not present

## 2020-09-12 DIAGNOSIS — R339 Retention of urine, unspecified: Secondary | ICD-10-CM

## 2020-09-12 DIAGNOSIS — J449 Chronic obstructive pulmonary disease, unspecified: Secondary | ICD-10-CM

## 2020-09-12 DIAGNOSIS — J440 Chronic obstructive pulmonary disease with acute lower respiratory infection: Secondary | ICD-10-CM | POA: Diagnosis not present

## 2020-09-12 DIAGNOSIS — Z23 Encounter for immunization: Secondary | ICD-10-CM

## 2020-09-12 DIAGNOSIS — R531 Weakness: Secondary | ICD-10-CM

## 2020-09-12 DIAGNOSIS — F1010 Alcohol abuse, uncomplicated: Secondary | ICD-10-CM | POA: Diagnosis not present

## 2020-09-12 DIAGNOSIS — J209 Acute bronchitis, unspecified: Secondary | ICD-10-CM

## 2020-09-12 MED ORDER — SPIRONOLACTONE 25 MG PO TABS: 25.0000 mg | ORAL_TABLET | Freq: Every day | ORAL | 1 refills | Status: DC

## 2020-09-12 MED ORDER — TORSEMIDE 20 MG PO TABS: 20.0000 mg | ORAL_TABLET | Freq: Every day | ORAL | 1 refills | Status: DC

## 2020-09-12 MED ORDER — LOSARTAN POTASSIUM 100 MG PO TABS: 100.0000 mg | ORAL_TABLET | Freq: Every day | ORAL | 1 refills | Status: DC

## 2020-09-12 MED ORDER — LACTULOSE 10 GM/15ML PO SOLN: 20.0000 g | Freq: Two times a day (BID) | ORAL | 1 refills | Status: DC

## 2020-09-12 NOTE — Patient Instructions (Signed)
Follow up with Cardiology- referral to pulmonary made  Please eat less than 2000 mg/day  Please weight self daily, notify PCP if > 5 lbs  Heart Failure Eating Plan Heart failure, also called congestive heart failure, occurs when your heart does not pump blood well enough to meet your body's needs for oxygen-rich blood. Heart failure is a long-term (chronic) condition. Living with heart failure can be challenging. Following your health care provider's instructions about a healthy lifestyle and working with a dietitian to choose the right foods may help to improve your symptoms. An eating plan for someone with heart failure will include changes that limit the intake of salt (sodium) and unhealthy fat. What are tips for following this plan? Reading food labels  Check food labels for the amount of sodium per serving. Choose foods that have less than 140 mg (milligrams) of sodium in each serving.  Check food labels for the number of calories per serving. This is important if you need to limit your daily calorie intake to lose weight.  Check food labels for the serving size. If you eat more than one serving, you will be eating more sodium and calories than what is listed on the label.  Look for foods that are labeled as "sodium-free," "very low sodium," or "low sodium." ? Foods labeled as "reduced sodium" or "lightly salted" may still have more sodium than what is recommended for you. Cooking  Avoid adding salt when cooking. Ask your health care provider or dietitian before using salt substitutes.  Season food with salt-free seasonings, spices, or herbs. Check the label of seasoning mixes to make sure they do not contain salt.  Cook with heart-healthy oils, such as olive, canola, soybean, or sunflower oil.  Do not fry foods. Cook foods using low-fat methods, such as baking, boiling, grilling, and broiling.  Limit unhealthy fats when cooking by: ? Removing the skin from poultry, such as  chicken. ? Removing all visible fats from meats. ? Skimming the fat off from stews, soups, and gravies before serving them. Meal planning  Limit your intake of: ? Processed, canned, or prepackaged foods. ? Foods that are high in trans fat, such as fried foods. ? Sweets, desserts, sugary drinks, and other foods with added sugar. ? Full-fat dairy products, such as whole milk.  Eat a balanced diet. This may include: ? 4-5 servings of fruit each day and 4-5 servings of vegetables each day. At each meal, try to fill one-half of your plate with fruits and vegetables. ? Up to 6-8 servings of whole grains each day. ? Up to 2 servings of lean meat, poultry, or fish each day. One serving of meat is equal to 3 oz (85 g). This is about the same size as a deck of cards. ? 2 servings of low-fat dairy each day. ? Heart-healthy fats. Healthy fats called omega-3 fatty acids are found in foods such as flaxseed and cold-water fish like sardines, salmon, and mackerel.  Aim to eat 25-35 g (grams) of fiber a day. Foods that are high in fiber include apples, broccoli, carrots, beans, peas, and whole grains.  Do not add salt or condiments that contain salt (such as soy sauce) to foods before eating.  When eating at a restaurant, ask that your food be prepared with less salt or no salt, if possible.  Try to eat 2 or more vegetarian meals each week.  Eat more home-cooked food and eat less restaurant, buffet, and fast food.   General information  Do not eat more than 2,300 mg of sodium a day. The amount of sodium that is recommended for you may be lower, depending on your condition.  Maintain a healthy body weight as directed. Ask your health care provider what a healthy weight is for you. ? Check your weight every day. ? Work with your health care provider and dietitian to make a plan that is right for you to lose weight or maintain your current weight.  Limit how much fluid you drink. Ask your health care  provider or dietitian how much fluid you can have each day.  Limit or avoid alcohol as told by your health care provider or dietitian. Recommended foods Fruits All fresh, frozen, and canned fruits. Dried fruits, such as raisins, prunes, and cranberries. Vegetables All fresh vegetables. Vegetables that are frozen without sauce or added salt. Low-sodium or sodium-free canned vegetables. Grains Bread with less than 80 mg of sodium per slice. Whole-wheat pasta, quinoa, and brown rice. Oats and oatmeal. Barley. Millet. Grits and cream of wheat. Whole-grain and whole-wheat cold cereal. Meats and other protein foods Lean cuts of meat. Skinless chicken and [place]. Fish with high omega-3 fatty acids, such as salmon, sardines, and other cold-water fishes. Eggs. Dried beans, peas, and edamame. Unsalted nuts and nut butters. Dairy Low-fat or nonfat (skim) milk and dried milk. Rice milk, soy milk, and almond milk. Low-fat or nonfat yogurt. Small amounts of reduced-sodium block cheese. Low-sodium cottage cheese. Fats and oils Olive, canola, soybean, flaxseed, avocado, or sunflower oil. Sweets and desserts Applesauce. Granola bars. Sugar-free pudding and gelatin. Frozen fruit bars. Seasoning and other foods Fresh and dried herbs. Lemon or lime juice. Vinegar. Low-sodium ketchup. Salt-free marinades, salad dressings, sauces, and seasonings. The items listed above may not be a complete list of foods and beverages you can eat. Contact a dietitian for more information. Foods to avoid Fruits Fruits that are dried with sodium-containing preservatives. Vegetables Canned vegetables. Frozen vegetables with sauce or seasonings. Creamed vegetables. [place] fries. Onion rings. Pickled vegetables and sauerkraut. Grains Bread with more than 80 mg of sodium per slice. Hot or cold cereal with more than 140 mg sodium per serving. Salted pretzels and crackers. Prepackaged breadcrumbs. Bagels, croissants, and  biscuits. Meats and other protein foods Ribs and chicken wings. Bacon, ham, pepperoni, bologna, salami, and packaged luncheon meats. Hot dogs, bratwurst, and sausage. Canned meat. Smoked meat and fish. Salted nuts and seeds. Dairy Whole milk, half-and-half, and cream. Buttermilk. Processed cheese, cheese spreads, and cheese curds. Regular cottage cheese. Feta cheese. Shredded cheese. String cheese. Fats and oils Butter, lard, shortening, ghee, and bacon fat. Canned and packaged gravies. Seasoning and other foods Onion salt, garlic salt, table salt, and sea salt. Marinades. Regular salad dressings. Relishes, pickles, and olives. Meat flavorings and tenderizers, and bouillon cubes. Horseradish, ketchup, and mustard. Worcestershire sauce. Teriyaki sauce, soy sauce (including reduced sodium). Hot sauce and Tabasco sauce. Steak sauce, fish sauce, oyster sauce, and cocktail sauce. Taco seasonings. Barbecue sauce. Tartar sauce. The items listed above may not be a complete list of foods and beverages you should avoid. Contact a dietitian for more information. Summary  A heart failure eating plan includes changes that limit your intake of sodium and unhealthy fat, and it may help you lose weight or maintain a healthy weight. Your health care provider may also recommend limiting how much fluid you drink.  Most people with heart failure should eat no more than 2,300 mg of salt (sodium) a day. The amount  of sodium that is recommended for you may be lower, depending on your condition.  Contact your health care provider or dietitian before making any major changes to your diet. This information is not intended to replace advice given to you by your health care provider. Make sure you discuss any questions you have with your health care provider. Document Revised: 01/21/2020 Document Reviewed: 01/21/2020 [employer]vier Patient Education  2021 [employer].

## 2020-09-12 NOTE — Progress Notes (Signed)
Careteam: Patient Care Team: Octavia Heir, NP as PCP - General (Adult Health Nurse Practitioner)  Seen by: Hazle Nordmann, AGNP-C  PLACE OF SERVICE:  [facility]  Advanced Directive information Does Patient Have a Medical Advance Directive?: No, Would patient like information on creating a medical advance directive?: No - Patient declined  No Known Allergies  No chief complaint on file.    HPI: Patient is a 70 y.o. male seen today to establish at [facility]. Medical records requested from [place].   Son present for encounter.   He was hospitalized at [facility] 02/14-02/26 for acute exacerbation of CHF, PE and chronic liver disease. Chest x-ray suggested mild congestive heart failure. He was given lasix during his stay and switched to spironolactone and torsemide at discharge. In addition, CT chest found several subsegmental pulmonary emboli in the pulmonary arteries supplying the bilateral upper lobes and left lower lobe. IV heparin was given for PE, switched to eliquis prior to discharge. It has been recommended he follow up with pulmonary specialist to determine long term anticoagulation. Bilirubin mildly elevated, follow up LFT recommended. He developed urinary retention during his stay, foley placed during hospitalization. He was advised to follow up with urology. He was discharged to snf for additional therapy.   I had followed him at [facility] during his stay. He was discharged 03/12. He currently lives with his son. He has been receiving PT/OT at home. Today, they report PT noticed his O2 sat was decreased the last few sessions. He has a 50 year smoking history. Averaged about 2 packs daily. Recently quit during past hospitalization. He has refrained from smoking since discharge from [facility]. He denies chest pain, but reports some shortness of breath with exertion. Admits to chronic bronchitis with sputum production. His initial oxygenation was  reported 72% in office. We placed 2 liters oxygen on him and his sats rebounded to 95%. Without oxygen at rest sats 88%.   In addition, he has been unable to take many of discharge medications. At this time, he has only been taking albuterol, Flomax, potassium and eliquis. He is followed by Dr. Jess Barters with [place], [place]. He has applied for medication approval but states nothing has been done. He is requesting losartan, torsemide, spironolactone, and lactulose. States he has inhalers and nebulizer medication, but no nebulizer machine.   Lives in a ranch style home with son. 5 front steps. Worked in Designer, fashion/clothing in past. Currently works part-time at [employer]. Does not drive.   No recent falls. Ambulating with walker.   No past surgical history.   Hospitalization when he was 40 due to alcohol and drugs, denies overdose. Went to rehab for about 30 days.   Past alcolhol abuse. Quit about 20 years.   Plans to see cardiologist next week[place].    Vaccinations requested from [place]. Believes he has had pneumonia vaccine in past, unsure if both. He has completed covid series with booster. Requesting shingles and tetanus vaccine.   Vision- he has trouble seeing close and far away. Followed by [facility] for eye care needs.   Teeth- needs new dentures. No sores in mouth. Denies pain.   Diet- son states they are trying to follow low sodium diet. He has also hid salt in house.   Colonscopy- never done. No family history of colon cancer. No changes with bowel movements or change in stool.      Review of Systems:  Review of Systems  Constitutional: Negative for chills, fever, malaise/fatigue and weight loss.  HENT: Negative for hearing loss and sore throat.   Eyes:       Presbyopia  Respiratory: Positive for cough, sputum production, shortness of breath and wheezing.   Cardiovascular: Positive for leg swelling. Negative for chest pain, palpitations and orthopnea.  Gastrointestinal: Negative for  abdominal pain, constipation, heartburn, nausea and vomiting.  Genitourinary: Negative for dysuria and frequency.  Musculoskeletal: Negative for falls, joint pain and myalgias.  Neurological: Positive for tremors and weakness. Negative for dizziness and headaches.  Psychiatric/Behavioral: Negative for depression. The patient is not nervous/anxious.        History of substance abuse    Past Medical History:  Diagnosis Date  . CHF (congestive heart failure) (HCC)   . COPD (chronic obstructive pulmonary disease) (HCC)   . Diabetes mellitus without complication (HCC)   . Dyspnea   . Hypertension    Past Surgical History:  Procedure Laterality Date  . NO PAST SURGERIES     Social History:   reports that he has quit smoking. His smoking use included cigarettes. He has a 60.00 pack-year smoking history. He has never used smokeless tobacco. He reports that he does not drink alcohol and does not use drugs.  Family History  Problem Relation Age of Onset  . Heart attack Brother     Medications: Patient's Medications  New Prescriptions   No medications on file  Previous Medications   ACETAMINOPHEN (TYLENOL) 500 MG TABLET    Take 1,000 mg by mouth every 8 (eight) hours as needed for mild pain or headache (or headaches).   ALBUTEROL (VENTOLIN HFA) 108 (90 BASE) MCG/ACT INHALER    Inhale 2 puffs into the lungs every 4 (four) hours as needed for wheezing or shortness of breath.   APIXABAN (ELIQUIS) 5 MG TABS TABLET    Take 1 tablet (5 mg total) by mouth 2 (two) times daily.   BUDESONIDE (PULMICORT) 0.5 MG/2ML NEBULIZER SOLUTION    Take 2 mLs (0.5 mg total) by nebulization 2 (two) times daily.   ENSURE (ENSURE)    Take 237 mLs by mouth in the morning and at bedtime.   GUAIFENESIN (ROBITUSSIN) 100 MG/5ML SYRUP    Take 100 mg by mouth at bedtime as needed (for chest congestion).   IPRATROPIUM-ALBUTEROL (DUONEB) 0.5-2.5 (3) MG/3ML SOLN    Take 3 mLs by nebulization every 4 (four) hours as needed.    LACTULOSE (CHRONULAC) 10 GM/15ML SOLUTION    Take 30 mLs (20 g total) by mouth 2 (two) times daily.   LOSARTAN (COZAAR) 100 MG TABLET    Take 1 tablet (100 mg total) by mouth daily.   MULTIPLE VITAMIN (MULTIVITAMIN WITH MINERALS) TABS TABLET    Take 1 tablet by mouth daily.   POTASSIUM CHLORIDE SA (KLOR-CON) 20 MEQ TABLET    Take 2 tablets (40 mEq total) by mouth daily.   SPIRONOLACTONE (ALDACTONE) 25 MG TABLET    Take 1 tablet (25 mg total) by mouth daily.   TAMSULOSIN (FLOMAX) 0.4 MG CAPS CAPSULE    Take 1 capsule (0.4 mg total) by mouth daily.   TIOTROPIUM BROMIDE-OLODATEROL (STIOLTO RESPIMAT) 2.5-2.5 MCG/ACT AERS    Inhale 1 puff into the lungs in the morning and at bedtime.   TORSEMIDE (DEMADEX) 20 MG TABLET    Take 1 tablet (20 mg total) by mouth daily.  Modified Medications   No medications on file  Discontinued Medications   No medications on file  Physical Exam:  Vitals:   09/12/20 1000  BP: 140/70  Pulse: 83  Temp: 97.7 F (36.5 C)  TempSrc: Temporal  SpO2: (!) 72%  Weight: 188 lb 9.6 oz (85.5 kg)  Height: 6\' 1"  (1.854 m)   Body mass index is 24.88 kg/m. Wt Readings from Last 3 Encounters:  09/12/20 188 lb 9.6 oz (85.5 kg)  08/28/20 171 lb 4.8 oz (77.7 kg)  08/25/20 167 lb 12.8 oz (76.1 kg)    Physical Exam Vitals reviewed.  Constitutional:      General: He is not in acute distress. HENT:     Head: Normocephalic.  Cardiovascular:     Rate and Rhythm: Normal rate and regular rhythm.     Pulses: Normal pulses.     Heart sounds: Normal heart sounds. No murmur heard.   Pulmonary:     Effort: Pulmonary effort is normal. No respiratory distress.     Breath sounds: Rhonchi present.  Abdominal:     General: Bowel sounds are normal. There is no distension.     Palpations: Abdomen is soft.     Tenderness: There is no abdominal tenderness.  Musculoskeletal:     Right lower leg: Edema present.     Left lower leg: Edema present.     Comments: Non-pitting   Skin:    General: Skin is warm and dry.     Capillary Refill: Capillary refill takes less than 2 seconds.  Neurological:     General: No focal deficit present.     Mental Status: He is alert and oriented to person, place, and time.     Motor: Weakness present.     Gait: Gait abnormal.     Comments: walker  Psychiatric:        Mood and Affect: Mood normal.        Behavior: Behavior normal.     Labs reviewed: Basic Metabolic Panel: Recent Labs    08/06/20 0353 08/07/20 0311 08/09/20 1835 08/10/20 0546 08/10/20 1602 08/11/20 0033 08/13/20 0326 08/13/20 1044 08/14/20 0340 08/15/20 0255 08/16/20 0750  NA 137   < >  --  141  --    < > 141   < > 131* 133* 131*  K 4.7   < >  --  3.3*  --    < > 2.6*   < > 4.4 3.1* 3.4*  CL 93*   < >  --  101  --    < > 93*   < > 94* 92* 97*  CO2 34*   < >  --  31  --    < > 38*   < > 30 31 25   GLUCOSE 113*   < >  --  140*  --    < > 57*   < > 92 99 90  BUN 23   < >  --  48*  --    < > 37*   < > 31* 34* 30*  CREATININE 0.94   < >  --  1.18  --    < > 1.08   < > 0.82 1.09 0.91  CALCIUM 9.2   < >  --  8.5*  --    < > 9.1   < > 8.7* 8.8* 8.7*  MG  --    < > 2.2 2.3 2.6*  --  2.1  --   --   --   --   PHOS  --    < > 3.5  4.3 5.0*  --   --   --   --   --   --   TSH 0.858  --   --   --   --   --   --   --   --   --   --    < > = values in this interval not displayed.   Liver Function Tests: Recent Labs    08/10/20 0546 08/11/20 0033 08/13/20 0326  AST 18 22 19   ALT 12 14 13   ALKPHOS 50 57 59  BILITOT 2.0* 1.8* 2.5*  PROT 6.9 7.7 7.2  ALBUMIN 2.8* 3.1* 2.9*   No results for input(s): LIPASE, AMYLASE in the last 8760 hours. Recent Labs    08/07/20 0733 08/08/20 0321 08/12/20 0853  AMMONIA 75* 30 30   CBC: Recent Labs    08/13/20 0326 08/14/20 0813 08/15/20 0959  WBC 5.9 4.7 4.1  HGB 16.7 16.3 16.4  HCT 53.1* 52.0 51.9  MCV 99.6 98.5 96.1  PLT 156 154 152   Lipid Panel: No results for input(s): CHOL, HDL, LDLCALC, TRIG,  CHOLHDL, LDLDIRECT in the last 8760 hours. TSH: Recent Labs    08/06/20 0353  TSH 0.858   A1C: Lab Results  Component Value Date   HGBA1C 6.2 (H) 08/05/2020     Assessment/Plan 1. Acute on chronic diastolic congestive heart failure (HCC) - some weight gain, sob with exertion, non pitting edema - advise restarting diuretics will send to [facility] on 4 dollars list - f/u with cardiology 03/29 - continue low sodium diet - torsemide (DEMADEX) 20 MG tablet; Take 1 tablet (20 mg total) by mouth daily.  Dispense: 90 tablet; Refill: 1 - spironolactone (ALDACTONE) 25 MG tablet; Take 1 tablet (25 mg total) by mouth daily.  Dispense: 30 tablet; Refill: 1  2. Essential hypertension - bp not elevated, he has not been taking losartan - cont low sodium diet - losartan (COZAAR) 100 MG tablet; Take 1 tablet (100 mg total) by mouth daily.  Dispense: 90 tablet; Refill: 1  3. Nondependent alcohol abuse, continuous drinking behavior - past history of alcohol abuse with rehab - lactulose (CHRONULAC) 10 GM/15ML solution; Take 30 mLs (20 g total) by mouth 2 (two) times daily.  Dispense: 1892 mL; Refill: 1  4. Acute bronchitis with COPD (HCC) - uses prn robitussin for cough - For home use only DME Nebulizer machine - For home use only DME oxygen - Ambulatory referral to Pulmonology  5. Chronic obstructive pulmonary disease, unspecified COPD type (HCC) - he did not require oxygen during his stay at [facility] - resting oxygen sat 88-90% during our enounter - cont prn albuterol, nebs, pulmicort and respimat - For home use only DME Nebulizer machine - For home use only DME oxygen - recommend low dose CT screening for lung cancer- future - Ambulatory referral to Pulmonology  6. Acute pulmonary embolism with acute cor pulmonale, unspecified pulmonary embolism type (HCC) - Eliquis samples given in office - denies chest pain, has sob with exertion - needs pulmonary consult to determine future  anticoagulation -  Ambulatory referral to Pulmonology  7. Type 2 diabetes mellitus without complication, without long-term current use of insulin (HCC) - no hypoglycemic events - not on meds this time - foot exam- future - urine microalbumin- future - recommend annual diabetic eye exam  8. Hyperammonemia (HCC) - GI f/u advised  9. Urinary retention - stable with flomax  10. Weakness - using walker, no recent falls -  cont home health PT/OT  11. Need for Tdap vaccination  12. Need for Shingles vaccination   Lab work to be done at [facility]- appointment scheduled 04/01  Total time: 65 minutes. 50% of total time spent doing patient counseling and coordination of care regarding heart failure, medications, and diet.   Next appt: 10/16/2020 Hazle Nordmann, Juel Burrow  [facility] [phone]

## 2020-09-15 NOTE — Progress Notes (Signed)
Cardiology Office Note:    Date:  09/19/2020   ID:  Mitchell King, DOB 12/17/1950, MRN [id number]  PCP:  Octavia HeirFargo, Amy E, NP  Cardiologist:  No primary care provider on file.  Electrophysiologist:  None   Referring MD: No ref. provider found   Chief Complaint  Patient presents with  . Congestive Heart Failure    History of Present Illness:    Mitchell King is a 70 y.o. male with a hx of PE, RV failure, T2DM, tobacco use who presents for hospital follow-up.  He was admitted from 2/14 through 08/16/2020 to [facility].  He was admitted with bilateral PE and right heart failure/cor pulmonale treated with diuretics and anticoagulation.  During admission he developed worsening encephalopathy secondary to hypercarbia, requiring intubation.  Improved with diuresis.  He was discharged on torsemide, spironolactone, losartan, and Eliquis.  Echocardiogram on 08/05/2020 showed LVEF 55 to 60%, normal diastolic function, interventricular septal flattening in systole consistent with RV pressure overload, normal RV function, severe RV dilatation, no significant valvular disease.  Since discharge from the hospital, he reports that he has been doing well.  States that he was out of his meds for 2 weeks but just restarted on Friday.  Reports was off his blood thinner for 2 weeks.  He denies any chest pain or dyspnea.  Reports occasional symptoms of lightheadedness in the morning.  Denies any syncope or lower extremity edema.  Reports he has not smoked since his hospitalization.  Family history includes mother had MI in 580s, brother had MI in 4250s.   Past Medical History:  Diagnosis Date  . CHF (congestive heart failure) (HCC)   . COPD (chronic obstructive pulmonary disease) (HCC)   . Diabetes mellitus without complication (HCC)   . Dyspnea   . Hypertension     Past Surgical History:  Procedure Laterality Date  . NO PAST SURGERIES      Current Medications: Current Meds  Medication Sig  . acetaminophen  (TYLENOL) 500 MG tablet Take 1,000 mg by mouth every 8 (eight) hours as needed for mild pain or headache (or headaches).  Marland Kitchen. albuterol (VENTOLIN HFA) 108 (90 Base) MCG/ACT inhaler Inhale 2 puffs into the lungs every 4 (four) hours as needed for wheezing or shortness of breath.  Marland Kitchen. apixaban (ELIQUIS) 5 MG TABS tablet Take 1 tablet (5 mg total) by mouth 2 (two) times daily.  . budesonide (PULMICORT) 0.5 MG/2ML nebulizer solution Take 2 mLs (0.5 mg total) by nebulization 2 (two) times daily.  . Ensure (ENSURE) Take 237 mLs by mouth in the morning and at bedtime.  Marland Kitchen. guaifenesin (ROBITUSSIN) 100 MG/5ML syrup Take 100 mg by mouth at bedtime as needed (for chest congestion).  Marland Kitchen. ipratropium-albuterol (DUONEB) 0.5-2.5 (3) MG/3ML SOLN Take 3 mLs by nebulization every 4 (four) hours as needed.  . lactulose (CHRONULAC) 10 GM/15ML solution Take 30 mLs (20 g total) by mouth 2 (two) times daily.  Marland Kitchen. losartan (COZAAR) 100 MG tablet Take 1 tablet (100 mg total) by mouth daily.  . Multiple Vitamin (MULTIVITAMIN WITH MINERALS) TABS tablet Take 1 tablet by mouth daily.  . potassium chloride SA (KLOR-CON) 20 MEQ tablet Take 2 tablets (40 mEq total) by mouth daily.  Marland Kitchen. spironolactone (ALDACTONE) 25 MG tablet Take 1 tablet (25 mg total) by mouth daily.  . tamsulosin (FLOMAX) 0.4 MG CAPS capsule Take 1 capsule (0.4 mg total) by mouth daily.  . Tiotropium Bromide-Olodaterol (STIOLTO RESPIMAT) 2.5-2.5 MCG/ACT AERS Inhale 1 puff into the lungs in  the morning and at bedtime.  . torsemide (DEMADEX) 20 MG tablet Take 1 tablet (20 mg total) by mouth daily.     Allergies:   Patient has no known allergies.   Social History   Socioeconomic History  . Marital status: Divorced    Spouse name: Not on file  . Number of children: Not on file  . Years of education: Not on file  . Highest education level: Not on file  Occupational History  . Not on file  Tobacco Use  . Smoking status: Former Smoker    Packs/day: 1.50    Years:  40.00    Pack years: 60.00    Types: Cigarettes  . Smokeless tobacco: Never Used  Vaping Use  . Vaping Use: Never used  Substance and Sexual Activity  . Alcohol use: No    Comment: non x 5 years   . Drug use: No  . Sexual activity: Not Currently  Other Topics Concern  . Not on file  Social History Narrative   Diet: Blank       Do you drink/ eat things with caffeine? No      Marital status:   Divorced                            What year were you married ? Blank      Do you live in a house, apartment,assistred living, condo, trailer, etc.)? House      Is it one or more stories? 1      How many persons live in your home ? 1      Do you have any pets in your home ?(please list) Dog      Highest Level of education completed: 12 th      Current or past profession: Blank      Do you exercise?    No                          Type & how often  Blank      ADVANCED DIRECTIVES (Please bring copies)      Do you have a living will? No      Do you have a DNR form? No                      If not, do you want to discuss one? No      Do you have signed POA?HPOA forms?  No               If so, please bring to your appointment No      FUNCTIONAL STATUS- To be completed by Spouse / child / Staff       Do you have difficulty bathing or dressing yourself ?  No        Do you have difficulty preparing food or eating ?  No      Do you have difficulty managing your mediation ?  No      Do you have difficulty managing your finances ?  No      Do you have difficulty affording your medication ?  No      Social Determinants of Corporate investment banker Strain: Not on file  Food Insecurity: Not on file  Transportation Needs: Not on file  Physical Activity: Not on file  Stress: Not on file  Social Connections: Not  on file     Family History: The patient's family history includes Heart attack in his brother.  ROS:   Please see the history of present illness.     All other  systems reviewed and are negative.  EKGs/Labs/Other Studies Reviewed:    The following studies were reviewed today:  EKG:  EKG is  ordered today.  The ekg ordered today demonstrates NSR, rate 71, TWI v1-3  Recent Labs: 08/06/2020: TSH 0.858 08/13/2020: ALT 13; B Natriuretic Peptide 604.3 08/15/2020: Hemoglobin 16.4; Platelets 152 09/16/2020: BUN 24; Creatinine, Ser 1.13; Magnesium 1.7; Potassium 5.0; Sodium 140  Recent Lipid Panel No results found for: CHOL, TRIG, HDL, CHOLHDL, VLDL, LDLCALC, LDLDIRECT  Physical Exam:    VS:  BP (!) 150/82 (BP Location: Right Arm, Patient Position: Sitting, Cuff Size: Normal)   Pulse 71   Ht 6\' 1"  (1.854 m)   Wt 179 lb (81.2 kg)   BMI 23.62 kg/m     Wt Readings from Last 3 Encounters:  09/16/20 179 lb (81.2 kg)  09/12/20 188 lb 9.6 oz (85.5 kg)  08/28/20 171 lb 4.8 oz (77.7 kg)     GEN:  Well nourished, well developed in no acute distress HEENT: Normal NECK: No JVD; No carotid bruits LYMPHATICS: No lymphadenopathy CARDIAC: RRR, no murmurs, rubs, gallops RESPIRATORY:  Clear to auscultation without rales, wheezing or rhonchi  ABDOMEN: Soft, non-tender, non-distended MUSCULOSKELETAL:  No edema; No deformity  SKIN: Warm and dry NEUROLOGIC:  Alert and oriented x 3 PSYCHIATRIC:  Normal affect   ASSESSMENT:    1. Acute right-sided heart failure (HCC)   2. Medication management   3. Chronic diastolic heart failure (HCC)   4. Acute pulmonary embolism with acute cor pulmonale, unspecified pulmonary embolism type (HCC)    PLAN:    Acute right Heart Failure, Diastolic dysfunction/cor pulmonale: during recent admission with bilateral Pes.  Echocardiogram on 08/05/2020 showed LVEF 55 to 60%, normal diastolic function, interventricular septal flattening in systole consistent with RV pressure overload, normal RV function, severe RV dilatation, no significant valvular disease. -Continue torsemide 20 mg daily -Continue losartan 100 mg daily,  spironolactone 25 mg daily -Will check BMP, magnesium -Repeat echo in 3 months to evaluate for improvement in RV with treatment of his PE  Acute bilateral pulmonary embolism:  Diagnosed on CTA chest 08/05/2020 -Continue Eliquis 5 mg twice daily  RTC in 3 months  Medication Adjustments/Labs and Tests Ordered: Current medicines are reviewed at length with the patient today.  Concerns regarding medicines are outlined above.  Orders Placed This Encounter  Procedures  . Basic metabolic panel  . Magnesium  . EKG 12-Lead  . ECHOCARDIOGRAM COMPLETE   No orders of the defined types were placed in this encounter.   Patient Instructions  Medication Instructions:  Your physician recommends that you continue on your current medications as directed. Please refer to the Current Medication list given to you today.  *If you need a refill on your cardiac medications before your next appointment, please call your pharmacy*   Lab Work: BMET, Mag today  If you have labs (blood work) drawn today and your tests are completely normal, you will receive your results only by: 08/07/2020 MyChart Message (if you have MyChart) OR . A paper copy in the mail If you have any lab test that is abnormal or we need to change your treatment, we will call you to review the results.   Testing/Procedures: Your physician has requested that you have an echocardiogram in  3 months. Echocardiography is a painless test that uses sound waves to create images of your heart. It provides your doctor with information about the size and shape of your heart and how well your heart's chambers and valves are working. This procedure takes approximately one hour. There are no restrictions for this procedure.  This will be done at our [facility] location:  [address]  Follow-Up: At [facility], you and your health needs are our priority.  As part of our continuing mission to provide you with exceptional heart care,  we have created designated Provider Care Teams.  These Care Teams include your primary Cardiologist (physician) and Advanced Practice Providers (APPs -  Physician Assistants and Nurse Practitioners) who all work together to provide you with the care you need, when you need it.  We recommend signing up for the patient portal called "MyChart".  Sign up information is provided on this After Visit Summary.  MyChart is used to connect with patients for Virtual Visits (Telemedicine).  Patients are able to view lab/test results, encounter notes, upcoming appointments, etc.  Non-urgent messages can be sent to your provider as well.   To learn more about what you can do with MyChart, go to [email or web address].    Your next appointment:   3 month(s)-(after echo completed)  The format for your next appointment:   In Person  Provider:   Epifanio Lesches, MD        Signed, Little Ishikawa, MD  09/19/2020 10:47 AM    [facility]

## 2020-09-16 ENCOUNTER — Other Ambulatory Visit: Payer: Self-pay

## 2020-09-16 ENCOUNTER — Ambulatory Visit: Payer: Medicare Other | Admitting: Cardiology

## 2020-09-16 ENCOUNTER — Encounter: Payer: Self-pay | Admitting: Cardiology

## 2020-09-16 VITALS — BP 150/82 | HR 71 | Ht 73.0 in | Wt 179.0 lb

## 2020-09-16 DIAGNOSIS — Z79899 Other long term (current) drug therapy: Secondary | ICD-10-CM | POA: Diagnosis not present

## 2020-09-16 DIAGNOSIS — I50811 Acute right heart failure: Secondary | ICD-10-CM | POA: Diagnosis not present

## 2020-09-16 DIAGNOSIS — I2609 Other pulmonary embolism with acute cor pulmonale: Secondary | ICD-10-CM

## 2020-09-16 DIAGNOSIS — I5032 Chronic diastolic (congestive) heart failure: Secondary | ICD-10-CM | POA: Diagnosis not present

## 2020-09-16 LAB — BASIC METABOLIC PANEL
BUN/Creatinine Ratio: 21 (ref 10–24)
BUN: 24 mg/dL (ref 8–27)
CO2: 30 mmol/L — ABNORMAL HIGH (ref 20–29)
Calcium: 10.1 mg/dL (ref 8.6–10.2)
Chloride: 95 mmol/L — ABNORMAL LOW (ref 96–106)
Creatinine, Ser: 1.13 mg/dL (ref 0.76–1.27)
Glucose: 96 mg/dL (ref 65–99)
Potassium: 5 mmol/L (ref 3.5–5.2)
Sodium: 140 mmol/L (ref 134–144)
eGFR: 70 mL/min/{1.73_m2} (ref >59)

## 2020-09-16 LAB — MAGNESIUM: Magnesium: 1.7 mg/dL (ref 1.6–2.3)

## 2020-09-16 NOTE — Patient Instructions (Signed)
Medication Instructions:  Your physician recommends that you continue on your current medications as directed. Please refer to the Current Medication list given to you today.  *If you need a refill on your cardiac medications before your next appointment, please call your pharmacy*   Lab Work: BMET, Mag today  If you have labs (blood work) drawn today and your tests are completely normal, you will receive your results only by: Marland Kitchen MyChart Message (if you have MyChart) OR . A paper copy in the mail If you have any lab test that is abnormal or we need to change your treatment, we will call you to review the results.   Testing/Procedures: Your physician has requested that you have an echocardiogram in 3 months. Echocardiography is a painless test that uses sound waves to create images of your heart. It provides your doctor with information about the size and shape of your heart and how well your heart's chambers and valves are working. This procedure takes approximately one hour. There are no restrictions for this procedure.  This will be done at our [facility] location:  [address]  Follow-Up: At [facility], you and your health needs are our priority.  As part of our continuing mission to provide you with exceptional heart care, we have created designated Provider Care Teams.  These Care Teams include your primary Cardiologist (physician) and Advanced Practice Providers (APPs -  Physician Assistants and Nurse Practitioners) who all work together to provide you with the care you need, when you need it.  We recommend signing up for the patient portal called "MyChart".  Sign up information is provided on this After Visit Summary.  MyChart is used to connect with patients for Virtual Visits (Telemedicine).  Patients are able to view lab/test results, encounter notes, upcoming appointments, etc.  Non-urgent messages can be sent to your provider as well.   To learn more about  what you can do with MyChart, go to [email or web address].    Your next appointment:   3 month(s)-(after echo completed)  The format for your next appointment:   In Person  Provider:   Epifanio Lesches, MD

## 2020-10-07 ENCOUNTER — Other Ambulatory Visit: Payer: Self-pay | Admitting: *Deleted

## 2020-10-07 ENCOUNTER — Encounter: Payer: Self-pay | Admitting: *Deleted

## 2020-10-07 DIAGNOSIS — I50811 Acute right heart failure: Secondary | ICD-10-CM

## 2020-10-07 DIAGNOSIS — Z79899 Other long term (current) drug therapy: Secondary | ICD-10-CM

## 2020-10-13 ENCOUNTER — Other Ambulatory Visit: Payer: Self-pay | Admitting: Orthopedic Surgery

## 2020-10-13 DIAGNOSIS — E119 Type 2 diabetes mellitus without complications: Secondary | ICD-10-CM

## 2020-10-13 DIAGNOSIS — F1010 Alcohol abuse, uncomplicated: Secondary | ICD-10-CM

## 2020-10-13 DIAGNOSIS — Z0000 Encounter for general adult medical examination without abnormal findings: Secondary | ICD-10-CM

## 2020-10-13 DIAGNOSIS — I10 Essential (primary) hypertension: Secondary | ICD-10-CM

## 2020-10-14 ENCOUNTER — Other Ambulatory Visit: Payer: Medicare Other

## 2020-10-14 ENCOUNTER — Other Ambulatory Visit: Payer: Self-pay

## 2020-10-14 DIAGNOSIS — F1010 Alcohol abuse, uncomplicated: Secondary | ICD-10-CM

## 2020-10-14 DIAGNOSIS — I10 Essential (primary) hypertension: Secondary | ICD-10-CM

## 2020-10-14 DIAGNOSIS — E119 Type 2 diabetes mellitus without complications: Secondary | ICD-10-CM

## 2020-10-14 DIAGNOSIS — Z0000 Encounter for general adult medical examination without abnormal findings: Secondary | ICD-10-CM

## 2020-10-15 LAB — CBC WITH DIFFERENTIAL/PLATELET
Absolute Monocytes: 770 cells/uL (ref 200–950)
Basophils Absolute: 22 cells/uL (ref 0–200)
Basophils Relative: 0.5 %
Eosinophils Absolute: 189 cells/uL (ref 15–500)
Eosinophils Relative: 4.4 %
HCT: 47.9 % (ref 38.5–50.0)
Hemoglobin: 15.9 g/dL (ref 13.2–17.1)
Lymphs Abs: 1531 cells/uL (ref 850–3900)
MCH: 30.7 pg (ref 27.0–33.0)
MCHC: 33.2 g/dL (ref 32.0–36.0)
MCV: 92.5 fL (ref 80.0–100.0)
MPV: 10.6 fL (ref 7.5–12.5)
Monocytes Relative: 17.9 %
Neutro Abs: 1789 cells/uL (ref 1500–7800)
Neutrophils Relative %: 41.6 %
Platelets: 170 10*3/uL (ref 140–400)
RBC: 5.18 10*6/uL (ref 4.20–5.80)
RDW: 11.9 % (ref 11.0–15.0)
Total Lymphocyte: 35.6 %
WBC: 4.3 10*3/uL (ref 3.8–10.8)

## 2020-10-15 LAB — HEPATIC FUNCTION PANEL
AG Ratio: 0.9 (calc) — ABNORMAL LOW (ref 1.0–2.5)
ALT: 46 U/L (ref 9–46)
AST: 32 U/L (ref 10–35)
Albumin: 4.2 g/dL (ref 3.6–5.1)
Alkaline phosphatase (APISO): 77 U/L (ref 35–144)
Bilirubin, Direct: 0.2 mg/dL (ref 0.0–0.2)
Globulin: 4.6 g/dL (calc) — ABNORMAL HIGH (ref 1.9–3.7)
Indirect Bilirubin: 0.5 mg/dL (calc) (ref 0.2–1.2)
Total Bilirubin: 0.7 mg/dL (ref 0.2–1.2)
Total Protein: 8.8 g/dL — ABNORMAL HIGH (ref 6.1–8.1)

## 2020-10-15 LAB — LIPID PANEL
Cholesterol: 153 mg/dL (ref <200)
HDL: 73 mg/dL (ref >=40)
LDL Cholesterol (Calc): 66 mg/dL (calc)
Non-HDL Cholesterol (Calc): 80 mg/dL (calc) (ref <130)
Total CHOL/HDL Ratio: 2.1 (calc) (ref <5.0)
Triglycerides: 60 mg/dL (ref <150)

## 2020-10-15 LAB — MICROALBUMIN / CREATININE URINE RATIO
Creatinine, Urine: 22 mg/dL (ref 20–320)
Microalb Creat Ratio: 73 mcg/mg creat — ABNORMAL HIGH (ref <30)
Microalb, Ur: 1.6 mg/dL

## 2020-10-15 LAB — HEMOGLOBIN A1C
Hgb A1c MFr Bld: 6.2 % of total Hgb — ABNORMAL HIGH (ref <5.7)
Mean Plasma Glucose: 131 mg/dL
eAG (mmol/L): 7.3 mmol/L

## 2020-10-16 ENCOUNTER — Encounter: Payer: Self-pay | Admitting: Orthopedic Surgery

## 2020-10-16 ENCOUNTER — Ambulatory Visit (INDEPENDENT_AMBULATORY_CARE_PROVIDER_SITE_OTHER): Payer: Medicare Other | Admitting: Orthopedic Surgery

## 2020-10-16 ENCOUNTER — Other Ambulatory Visit: Payer: Self-pay

## 2020-10-16 VITALS — BP 130/80 | HR 81 | Temp 97.7°F | Resp 20 | Ht 73.0 in | Wt 185.6 lb

## 2020-10-16 DIAGNOSIS — I10 Essential (primary) hypertension: Secondary | ICD-10-CM | POA: Diagnosis not present

## 2020-10-16 DIAGNOSIS — Z23 Encounter for immunization: Secondary | ICD-10-CM

## 2020-10-16 DIAGNOSIS — J209 Acute bronchitis, unspecified: Secondary | ICD-10-CM | POA: Diagnosis not present

## 2020-10-16 DIAGNOSIS — J440 Chronic obstructive pulmonary disease with acute lower respiratory infection: Secondary | ICD-10-CM

## 2020-10-16 DIAGNOSIS — E119 Type 2 diabetes mellitus without complications: Secondary | ICD-10-CM | POA: Diagnosis not present

## 2020-10-16 DIAGNOSIS — R531 Weakness: Secondary | ICD-10-CM | POA: Diagnosis not present

## 2020-10-16 DIAGNOSIS — I5033 Acute on chronic diastolic (congestive) heart failure: Secondary | ICD-10-CM | POA: Diagnosis not present

## 2020-10-16 DIAGNOSIS — J449 Chronic obstructive pulmonary disease, unspecified: Secondary | ICD-10-CM | POA: Diagnosis not present

## 2020-10-16 DIAGNOSIS — R339 Retention of urine, unspecified: Secondary | ICD-10-CM

## 2020-10-16 DIAGNOSIS — F1010 Alcohol abuse, uncomplicated: Secondary | ICD-10-CM | POA: Diagnosis not present

## 2020-10-16 DIAGNOSIS — R809 Proteinuria, unspecified: Secondary | ICD-10-CM

## 2020-10-16 DIAGNOSIS — I2609 Other pulmonary embolism with acute cor pulmonale: Secondary | ICD-10-CM | POA: Diagnosis not present

## 2020-10-16 LAB — BASIC METABOLIC PANEL
BUN/Creatinine Ratio: 30 (calc) — ABNORMAL HIGH (ref 6–22)
BUN: 78 mg/dL — ABNORMAL HIGH (ref 7–25)
CO2: 28 mmol/L (ref 20–32)
Calcium: 9.9 mg/dL (ref 8.6–10.3)
Chloride: 99 mmol/L (ref 98–110)
Creat: 2.59 mg/dL — ABNORMAL HIGH (ref 0.70–1.18)
Glucose, Bld: 89 mg/dL (ref 65–139)
Potassium: 4.7 mmol/L (ref 3.5–5.3)
Sodium: 134 mmol/L — ABNORMAL LOW (ref 135–146)

## 2020-10-16 LAB — MAGNESIUM: Magnesium: 2.2 mg/dL (ref 1.5–2.5)

## 2020-10-16 MED ORDER — POTASSIUM CHLORIDE CRYS ER 20 MEQ PO TBCR: 40.0000 meq | EXTENDED_RELEASE_TABLET | Freq: Every day | ORAL | 2 refills | Status: DC

## 2020-10-16 MED ORDER — TORSEMIDE 20 MG PO TABS: 20.0000 mg | ORAL_TABLET | Freq: Every day | ORAL | 1 refills | Status: DC

## 2020-10-16 NOTE — Patient Instructions (Signed)
Preventive Care 70 Years and Older, Male Preventive care refers to lifestyle choices and visits with your health care provider that can promote health and wellness. This includes:  A yearly physical exam. This is also called an annual wellness visit.  Regular dental and eye exams.  Immunizations.  Screening for certain conditions.  Healthy lifestyle choices, such as: ? Eating a healthy diet. ? Getting regular exercise. ? Not using drugs or products that contain nicotine and tobacco. ? Limiting alcohol use. What can I expect for my preventive care visit? Physical exam Your health care provider will check your:  Height and weight. These may be used to calculate your BMI (body mass index). BMI is a measurement that tells if you are at a healthy weight.  Heart rate and blood pressure.  Body temperature.  Skin for abnormal spots. Counseling Your health care provider may ask you questions about your:  Past medical problems.  Family's medical history.  Alcohol, tobacco, and drug use.  Emotional well-being.  Home life and relationship well-being.  Sexual activity.  Diet, exercise, and sleep habits.  History of falls.  Memory and ability to understand (cognition).  Work and work environment.  Access to firearms. What immunizations do I need? Vaccines are usually given at various ages, according to a schedule. Your health care provider will recommend vaccines for you based on your age, medical history, and lifestyle or other factors, such as travel or where you work.   What tests do I need? Blood tests  Lipid and cholesterol levels. These may be checked every 5 years, or more often depending on your overall health.  Hepatitis C test.  Hepatitis B test. Screening  Lung cancer screening. You may have this screening every year starting at age 70 if you have a 30-pack-year history of smoking and currently smoke or have quit within the past 15 years.  Colorectal  cancer screening. ? All adults should have this screening starting at age 70 and continuing until age 75. ? Your health care provider may recommend screening at age 45 if you are at increased risk. ? You will have tests every 1-10 years, depending on your results and the type of screening test.  Prostate cancer screening. Recommendations will vary depending on your family history and other risks.  Genital exam to check for testicular cancer or hernias.  Diabetes screening. ? This is done by checking your blood sugar (glucose) after you have not eaten for a while (fasting). ? You may have this done every 1-3 years.  Abdominal aortic aneurysm (AAA) screening. You may need this if you are a current or former smoker.  STD (sexually transmitted disease) testing, if you are at risk. Follow these instructions at home: Eating and drinking  Eat a diet that includes fresh fruits and vegetables, whole grains, lean protein, and low-fat dairy products. Limit your intake of foods with high amounts of sugar, saturated fats, and salt.  Take vitamin and mineral supplements as recommended by your health care provider.  Do not drink alcohol if your health care provider tells you not to drink.  If you drink alcohol: ? Limit how much you have to 0-2 drinks a day. ? Be aware of how much alcohol is in your drink. In the [place] one drink equals one 12 oz bottle of beer (355 mL), one 5 oz glass of wine (148 mL), or one 1 oz glass of hard liquor (44 mL).   Lifestyle  Take daily care of your teeth   and gums. Brush your teeth every morning and night with fluoride toothpaste. Floss one time each day.  Stay active. Exercise for at least 30 minutes 5 or more days each week.  Do not use any products that contain nicotine or tobacco, such as cigarettes, e-cigarettes, and chewing tobacco. If you need help quitting, ask your health care provider.  Do not use drugs.  If you are sexually active, practice safe sex.  Use a condom or other form of protection to prevent STIs (sexually transmitted infections).  Talk with your health care provider about taking a low-dose aspirin or statin.  Find healthy ways to cope with stress, such as: ? Meditation, yoga, or listening to music. ? Journaling. ? Talking to a trusted person. ? Spending time with friends and family. Safety  Always wear your seat belt while driving or riding in a vehicle.  Do not drive: ? If you have been drinking alcohol. Do not ride with someone who has been drinking. ? When you are tired or distracted. ? While texting.  Wear a helmet and other protective equipment during sports activities.  If you have firearms in your house, make sure you follow all gun safety procedures. What's next?  Visit your health care provider once a year for an annual wellness visit.  Ask your health care provider how often you should have your eyes and teeth checked.  Stay up to date on all vaccines. This information is not intended to replace advice given to you by your health care provider. Make sure you discuss any questions you have with your health care provider. Document Revised: 03/06/2019 Document Reviewed: 06/01/2018 Elsevier Patient Education  2021 Elsevier Inc.  

## 2020-10-16 NOTE — Progress Notes (Signed)
Careteam: Patient Care Team: Octavia Heir, NP as PCP - General (Adult Health Nurse Practitioner)  Seen by: Hazle Nordmann, AGNP-C  PLACE OF SERVICE:  [facility]  Advanced Directive information Does Patient Have a Medical Advance Directive?: No, Would patient like information on creating a medical advance directive?: No - Patient declined  No Known Allergies  Chief Complaint  Patient presents with  . Medical Management of Chronic Issues    4 Week Follow Up/ Would like to discuss if he needs to have the labs done prescribed by heart doctor since had just had labs done with [place] attached to [facility]     HPI: Patient is a 70 y.o. male seen today for medical management of chronic conditions.   Lab results discussed with patient.   Last visit, his oxygenation was low. Oxygen was ordered. Today, he reports his breathing has improved. Denies shortness of breath or cough. Uses 2 liters of oxygen as needed. Has had to use portable oxygen tank when being outside. Some shortness of breath with exertion.  No recent albuterol or nebulizer use. Continues to use Stiolto daily. He has not smoked cigarettes since hospitalization in February 2022. History of smoking 2 packs a day for > 30 years.   Yesterday was last day of PT. Still walking with walker. PT said he could transition to cane. No recent falls or injuries.   Some joint stiffness in the morning, especially knees and shoulders. Improves with activity throughout day. Will take tylenol 1000 mg prn for mild pain.   Plans to see [facility] 05/13. Plans to have eye exam with them .   Denies lower leg edema. Continues to follow a low sodium diet with help of his son.   Magnesium and bmp labs requested to be done per cardiology.   Review of Systems:  Review of Systems  Constitutional: Negative for chills, fever and malaise/fatigue.  HENT: Negative for hearing loss and sore throat.   Eyes: Positive for blurred vision.       Glasses  Respiratory: Positive  for shortness of breath. Negative for cough and wheezing.   Cardiovascular: Negative for chest pain and leg swelling.  Gastrointestinal: Negative for abdominal pain, constipation, diarrhea and heartburn.  Genitourinary: Negative for dysuria, frequency and hematuria.  Musculoskeletal: Positive for joint pain and myalgias. Negative for falls.  Skin: Negative.   Neurological: Negative for dizziness, weakness and headaches.  Psychiatric/Behavioral: Negative for depression. The patient is not nervous/anxious.     Past Medical History:  Diagnosis Date  . CHF (congestive heart failure) (HCC)   . COPD (chronic obstructive pulmonary disease) (HCC)   . Diabetes mellitus without complication (HCC)   . Dyspnea   . Hypertension    Past Surgical History:  Procedure Laterality Date  . NO PAST SURGERIES     Social History:   reports that he has quit smoking. His smoking use included cigarettes. He has a 60.00 pack-year smoking history. He has never used smokeless tobacco. He reports that he does not drink alcohol and does not use drugs.  Family History  Problem Relation Age of Onset  . Heart attack Brother     Medications: Patient's Medications  New Prescriptions   No medications on file  Previous Medications   ACETAMINOPHEN (TYLENOL) 500 MG TABLET    Take 1,000 mg by mouth every 8 (eight) hours as needed for mild pain or headache (or headaches).   ALBUTEROL (VENTOLIN HFA) 108 (90 BASE) MCG/ACT INHALER  Inhale 2 puffs into the lungs every 4 (four) hours as needed for wheezing or shortness of breath.   APIXABAN (ELIQUIS) 5 MG TABS TABLET    Take 1 tablet (5 mg total) by mouth 2 (two) times daily.   BUDESONIDE (PULMICORT) 0.5 MG/2ML NEBULIZER SOLUTION    Take 2 mLs (0.5 mg total) by nebulization 2 (two) times daily.   ENSURE (ENSURE)    Take 237 mLs by mouth in the morning and at bedtime.   GUAIFENESIN (ROBITUSSIN) 100 MG/5ML SYRUP    Take 100 mg by mouth at bedtime as needed (for chest  congestion).   IPRATROPIUM-ALBUTEROL (DUONEB) 0.5-2.5 (3) MG/3ML SOLN    Take 3 mLs by nebulization every 4 (four) hours as needed.   LACTULOSE (CHRONULAC) 10 GM/15ML SOLUTION    Take 30 mLs (20 g total) by mouth 2 (two) times daily.   LOSARTAN (COZAAR) 100 MG TABLET    Take 1 tablet (100 mg total) by mouth daily.   MULTIPLE VITAMIN (MULTIVITAMIN WITH MINERALS) TABS TABLET    Take 1 tablet by mouth daily.   SPIRONOLACTONE (ALDACTONE) 25 MG TABLET    Take 1 tablet (25 mg total) by mouth daily.   TAMSULOSIN (FLOMAX) 0.4 MG CAPS CAPSULE    Take 1 capsule (0.4 mg total) by mouth daily.   TIOTROPIUM BROMIDE-OLODATEROL (STIOLTO RESPIMAT) 2.5-2.5 MCG/ACT AERS    Inhale 1 puff into the lungs in the morning and at bedtime.  Modified Medications   Modified Medication Previous Medication   POTASSIUM CHLORIDE SA (KLOR-CON) 20 MEQ TABLET potassium chloride SA (KLOR-CON) 20 MEQ tablet      Take 2 tablets (40 mEq total) by mouth daily.    Take 2 tablets (40 mEq total) by mouth daily.   TORSEMIDE (DEMADEX) 20 MG TABLET torsemide (DEMADEX) 20 MG tablet      Take 1 tablet (20 mg total) by mouth daily.    Take 1 tablet (20 mg total) by mouth daily.  Discontinued Medications   No medications on file    Physical Exam:  Vitals:   10/16/20 1411  BP: 130/80  Pulse: 81  Resp: 20  Temp: 97.7 F (36.5 C)  TempSrc: Temporal  SpO2: 93%  Weight: 185 lb 9.6 oz (84.2 kg)  Height: 6\' 1"  (1.854 m)   Body mass index is 24.49 kg/m. Wt Readings from Last 3 Encounters:  10/16/20 185 lb 9.6 oz (84.2 kg)  09/16/20 179 lb (81.2 kg)  09/12/20 188 lb 9.6 oz (85.5 kg)    Physical Exam Vitals reviewed.  Constitutional:      General: He is not in acute distress. HENT:     Head: Normocephalic.  Eyes:     General:        Right eye: No discharge.        Left eye: No discharge.  Cardiovascular:     Rate and Rhythm: Normal rate and regular rhythm.     Pulses: Normal pulses.     Heart sounds: Normal heart sounds.  No murmur heard.   Pulmonary:     Effort: Pulmonary effort is normal. No respiratory distress.     Breath sounds: Normal breath sounds. No wheezing.  Abdominal:     General: Abdomen is flat. Bowel sounds are normal. There is no distension.     Palpations: Abdomen is soft.     Tenderness: There is no abdominal tenderness.  Musculoskeletal:     Cervical back: Normal range of motion.     Right lower  leg: No edema.     Left lower leg: No edema.  Lymphadenopathy:     Cervical: No cervical adenopathy.  Skin:    General: Skin is warm and dry.     Capillary Refill: Capillary refill takes less than 2 seconds.  Neurological:     General: No focal deficit present.     Mental Status: He is alert and oriented to person, place, and time.     Motor: Weakness present.     Gait: Gait abnormal.     Comments: walker  Psychiatric:        Mood and Affect: Mood normal.        Behavior: Behavior normal.     Labs reviewed: Basic Metabolic Panel: Recent Labs    08/06/20 0353 08/07/20 0311 08/09/20 1835 08/10/20 0546 08/10/20 1602 08/11/20 0033 08/13/20 0326 08/13/20 1044 08/15/20 0255 08/16/20 0750 09/16/20 1025  NA 137   < >  --  141  --    < > 141   < > 133* 131* 140  K 4.7   < >  --  3.3*  --    < > 2.6*   < > 3.1* 3.4* 5.0  CL 93*   < >  --  101  --    < > 93*   < > 92* 97* 95*  CO2 34*   < >  --  31  --    < > 38*   < > 31 25 30*  GLUCOSE 113*   < >  --  140*  --    < > 57*   < > 99 90 96  BUN 23   < >  --  48*  --    < > 37*   < > 34* 30* 24  CREATININE 0.94   < >  --  1.18  --    < > 1.08   < > 1.09 0.91 1.13  CALCIUM 9.2   < >  --  8.5*  --    < > 9.1   < > 8.8* 8.7* 10.1  MG  --    < > 2.2 2.3 2.6*  --  2.1  --   --   --  1.7  PHOS  --    < > 3.5 4.3 5.0*  --   --   --   --   --   --   TSH 0.858  --   --   --   --   --   --   --   --   --   --    < > = values in this interval not displayed.   Liver Function Tests: Recent Labs    08/10/20 0546 08/11/20 0033  08/13/20 0326 10/14/20 0835  AST 18 22 19  32  ALT 12 14 13  46  ALKPHOS 50 57 59  --   BILITOT 2.0* 1.8* 2.5* 0.7  PROT 6.9 7.7 7.2 8.8*  ALBUMIN 2.8* 3.1* 2.9*  --    No results for input(s): LIPASE, AMYLASE in the last 8760 hours. Recent Labs    08/07/20 0733 08/08/20 0321 08/12/20 0853  AMMONIA 75* 30 30   CBC: Recent Labs    08/14/20 0813 08/15/20 0959 10/14/20 0835  WBC 4.7 4.1 4.3  NEUTROABS  --   --  1,789  HGB 16.3 16.4 15.9  HCT 52.0 51.9 47.9  MCV 98.5 96.1 92.5  PLT 154 152 170   Lipid  Panel: Recent Labs    10/14/20 0835  CHOL 153  HDL 73  LDLCALC 66  TRIG 60  CHOLHDL 2.1   TSH: Recent Labs    08/06/20 0353  TSH 0.858   A1C: Lab Results  Component Value Date   HGBA1C 6.2 (H) 10/14/2020     Assessment/Plan 1. Acute on chronic diastolic congestive heart failure (HCC) - no weight fluctuations, sob or ankle edema - creatine 2.59- suggest discontinuing daily spironolactone, torsemide and potassium - recommend changing torsemide to prn daily for ankle edema or weight gain of > 3lbs - continue daily weights - torsemide (DEMADEX) 20 MG tablet; Take 1 tablet (20 mg total) by mouth daily.  Dispense: 30 tablet; Refill: 1 - Magnesium - Basic Metabolic Panel- in 2 weeks  2. Essential hypertension - bp at goal with losartan - cont low sodium diet - cbc/diff- future - cmp- future  3. Nondependent alcohol abuse, continuous drinking behavior - AST 32, ALT 46 - refrained from drinking 20 years ago - cmp- future  4. Type 2 diabetes mellitus without complication, without long-term current use of insulin (HCC) - A1c 6.2 - urine microalbumin 78 - A1c- future- may consider starting metformin - recommend diabetic eye exam  5. Acute bronchitis with COPD (HCC) - denies cough or sob - cont prn albuterol and nebs - cont Stioloto  6. Chronic obstructive pulmonary disease, unspecified COPD type (HCC) - see above  7. Acute pulmonary embolism with  acute cor pulmonale, unspecified pulmonary embolism type (HCC) - advised to see pulmonary specialist to determine how long he should be on DOAC - cont Eliquis 5 mg twice daily  8. Urinary retention - stable with daily flomax  9. Weakness - improved due to PT - no recent falls, walking with cane  10. Urine test positive for microalbuminuria - denies neuropathy - foot exam 10/10 sensation on left and right foot - repeat urine microalbumin in 3 months  11. Need for Tdap vaccination - [facility]   Total time: 45 minutes. Greater than 50% of total time spent doing patient counseling and coordination of care on diabetes, COPD and heart failure management.    Next appt: 01/15/2021 Hazle Nordmann, Juel Burrow  [facility] [phone]

## 2020-10-17 ENCOUNTER — Other Ambulatory Visit: Payer: Self-pay | Admitting: Orthopedic Surgery

## 2020-10-17 ENCOUNTER — Telehealth: Payer: Self-pay | Admitting: Orthopedic Surgery

## 2020-10-17 ENCOUNTER — Telehealth: Payer: Self-pay | Admitting: Cardiology

## 2020-10-17 DIAGNOSIS — I10 Essential (primary) hypertension: Secondary | ICD-10-CM

## 2020-10-17 MED ORDER — AMLODIPINE BESYLATE 5 MG PO TABS: 5.0000 mg | ORAL_TABLET | Freq: Every day | ORAL | 3 refills | Status: DC

## 2020-10-17 MED ORDER — TORSEMIDE 20 MG PO TABS: 20.0000 mg | ORAL_TABLET | Freq: Every day | ORAL | 1 refills | Status: DC | PRN

## 2020-10-17 NOTE — Telephone Encounter (Signed)
Spoke with patient regarding upcoming appointment with Dr. Andree Elk 10/23/20 at 12:00pm---patient voiced his understanding---states he will talk with his son and if he needs to reschedule he will call.

## 2020-10-17 NOTE — Telephone Encounter (Signed)
Discussed new lab results with patient. Advised to stop spirolactone, torsemide and potassium. Advised him to use torsemide as needed for ankle edema or weight gain of > 3 lbs in 2 days. Recommend  2 week follow up and repeat bmp to be drawn, he agreed.

## 2020-10-17 NOTE — Progress Notes (Signed)
Advised by Dr. Bjorn Pippin to stop losartan and start amlodipine. New prescription sent to [facility]. I have discussed medication changes with Mitchell King.

## 2020-10-23 ENCOUNTER — Ambulatory Visit: Payer: Medicare Other | Admitting: Cardiology

## 2020-10-23 ENCOUNTER — Other Ambulatory Visit: Payer: Self-pay

## 2020-10-23 ENCOUNTER — Encounter: Payer: Self-pay | Admitting: Cardiology

## 2020-10-23 VITALS — BP 120/88 | HR 86 | Ht 73.0 in | Wt 189.0 lb

## 2020-10-23 DIAGNOSIS — Z79899 Other long term (current) drug therapy: Secondary | ICD-10-CM | POA: Diagnosis not present

## 2020-10-23 DIAGNOSIS — I50811 Acute right heart failure: Secondary | ICD-10-CM

## 2020-10-23 DIAGNOSIS — I10 Essential (primary) hypertension: Secondary | ICD-10-CM | POA: Diagnosis not present

## 2020-10-23 DIAGNOSIS — I2609 Other pulmonary embolism with acute cor pulmonale: Secondary | ICD-10-CM | POA: Diagnosis not present

## 2020-10-23 NOTE — Progress Notes (Incomplete)
BP Readings from Last 3 Encounters:  10/16/20 130/80  09/16/20 (!) 150/82  09/12/20 140/70    Wt Readings from Last 3 Encounters:  10/16/20 185 lb 9.6 oz (84.2 kg)  09/16/20 179 lb (81.2 kg)  09/12/20 188 lb 9.6 oz (85.5 kg)

## 2020-10-23 NOTE — Progress Notes (Signed)
Cardiology Office Note:    Date:  10/24/2020   ID:  Mitchell BLIZARD, DOB 05-Aug-1950, MRN [id number]  PCP:  Octavia Heir, NP  Cardiologist:  None  Electrophysiologist:  None   Referring MD: Octavia Heir, NP   Chief Complaint  Patient presents with  . Congestive Heart Failure    History of Present Illness:    Mitchell King is a 70 y.o. male with a hx of PE, RV failure, T2DM, tobacco use who presents for hospital follow-up.  He was admitted from 2/14 through 08/16/2020 to [facility].  He was admitted with bilateral PE and right heart failure/cor pulmonale treated with diuretics and anticoagulation.  During admission he developed worsening encephalopathy secondary to hypercarbia, requiring intubation.  Improved with diuresis.  He was discharged on torsemide, spironolactone, losartan, and Eliquis.  Echocardiogram on 08/05/2020 showed LVEF 55 to 60%, normal diastolic function, interventricular septal flattening in systole consistent with RV pressure overload, normal RV function, severe RV dilatation, no significant valvular disease.  Since last clinic visit, he reports that he has been doing well.  Torsemide was discontinued due to AKI on 10/17/2020.  He has not noticed any lower extremity edema.  For exercise, he does squats, leg exercises, and walks throughout the house and outside as well. He does not have exertional symptoms. He denies any bleeding issues, no hematuria or blood in his stool. He denies any chest pain, shortness of breath, lightheadedness, or syncope.  Past Medical History:  Diagnosis Date  . CHF (congestive heart failure) (HCC)   . COPD (chronic obstructive pulmonary disease) (HCC)   . Diabetes mellitus without complication (HCC)   . Dyspnea   . Hypertension     Past Surgical History:  Procedure Laterality Date  . NO PAST SURGERIES      Current Medications: Current Meds  Medication Sig  . acetaminophen (TYLENOL) 500 MG tablet Take 1,000 mg by mouth every 8 (eight) hours  as needed for mild pain or headache (or headaches).  Marland Kitchen albuterol (VENTOLIN HFA) 108 (90 Base) MCG/ACT inhaler Inhale 2 puffs into the lungs every 4 (four) hours as needed for wheezing or shortness of breath.  Marland Kitchen amLODipine (NORVASC) 5 MG tablet Take 1 tablet (5 mg total) by mouth daily.  Marland Kitchen apixaban (ELIQUIS) 5 MG TABS tablet Take 1 tablet (5 mg total) by mouth 2 (two) times daily.  Marland Kitchen guaifenesin (ROBITUSSIN) 100 MG/5ML syrup Take 100 mg by mouth at bedtime as needed (for chest congestion).  Marland Kitchen ipratropium-albuterol (DUONEB) 0.5-2.5 (3) MG/3ML SOLN Take 3 mLs by nebulization every 4 (four) hours as needed.  . lactulose (CHRONULAC) 10 GM/15ML solution Take 30 mLs (20 g total) by mouth 2 (two) times daily.  . [DISCONTINUED] budesonide (PULMICORT) 0.5 MG/2ML nebulizer solution Take 2 mLs (0.5 mg total) by nebulization 2 (two) times daily.  . [DISCONTINUED] Ensure (ENSURE) Take 237 mLs by mouth in the morning and at bedtime.  . [DISCONTINUED] Multiple Vitamin (MULTIVITAMIN WITH MINERALS) TABS tablet Take 1 tablet by mouth daily.  . [DISCONTINUED] tamsulosin (FLOMAX) 0.4 MG CAPS capsule Take 1 capsule (0.4 mg total) by mouth daily.  . [DISCONTINUED] Tiotropium Bromide-Olodaterol (STIOLTO RESPIMAT) 2.5-2.5 MCG/ACT AERS Inhale 1 puff into the lungs in the morning and at bedtime.  . [DISCONTINUED] torsemide (DEMADEX) 20 MG tablet Take 1 tablet (20 mg total) by mouth daily as needed (for ankle edema or weight gain > 3 lbs).     Allergies:   Patient has no known allergies.  Social History   Socioeconomic History  . Marital status: Divorced    Spouse name: Not on file  . Number of children: Not on file  . Years of education: Not on file  . Highest education level: Not on file  Occupational History  . Not on file  Tobacco Use  . Smoking status: Former Smoker    Packs/day: 1.50    Years: 40.00    Pack years: 60.00    Types: Cigarettes  . Smokeless tobacco: Never Used  Vaping Use  . Vaping Use:  Never used  Substance and Sexual Activity  . Alcohol use: No    Comment: non x 5 years   . Drug use: No  . Sexual activity: Not Currently  Other Topics Concern  . Not on file  Social History Narrative   Diet: Blank       Do you drink/ eat things with caffeine? No      Marital status:   Divorced                            What year were you married ? Blank      Do you live in a house, apartment,assistred living, condo, trailer, etc.)? House      Is it one or more stories? 1      How many persons live in your home ? 1      Do you have any pets in your home ?(please list) Dog      Highest Level of education completed: 12 th      Current or past profession: Blank      Do you exercise?    No                          Type & how often  Blank      ADVANCED DIRECTIVES (Please bring copies)      Do you have a living will? No      Do you have a DNR form? No                      If not, do you want to discuss one? No      Do you have signed POA?HPOA forms?  No               If so, please bring to your appointment No      FUNCTIONAL STATUS- To be completed by Spouse / child / Staff       Do you have difficulty bathing or dressing yourself ?  No        Do you have difficulty preparing food or eating ?  No      Do you have difficulty managing your mediation ?  No      Do you have difficulty managing your finances ?  No      Do you have difficulty affording your medication ?  No      Social Determinants of Corporate investment bankerHealth   Financial Resource Strain: Not on file  Food Insecurity: Not on file  Transportation Needs: Not on file  Physical Activity: Not on file  Stress: Not on file  Social Connections: Not on file     Family History: The patient's family history includes Heart attack in his brother.  ROS:   Please see the history of present illness.    All other systems  reviewed and are negative.  EKGs/Labs/Other Studies Reviewed:    The following studies were reviewed  today:  EKG:   09/16/20: NSR, rate 71, TWI v1-3 10/23/20: NSR, Rate 86 bpm. PACs, Non specific T wave flattening. Left axis deviation.  Echo 08/05/2020: 1. Left ventricular ejection fraction, by estimation, is 55 to 60%. The  left ventricle has normal function. The left ventricle has no regional  wall motion abnormalities. There is mild concentric left ventricular  hypertrophy. Left ventricular diastolic  parameters were normal. There is the interventricular septum is flattened  in systole, consistent with right ventricular pressure overload.  2. Right ventricular systolic function is normal. The right ventricular  size is severely enlarged. Moderately increased right ventricular wall  thickness. There is mildly elevated pulmonary artery systolic pressure.  The estimated right ventricular  systolic pressure is 44.4 mmHg (suspect this is an underestimation due to  the faint TR jet).  3. Right atrial size was severely dilated.  4. The mitral valve is normal in structure. No evidence of mitral valve  regurgitation. No evidence of mitral stenosis.  5. The aortic valve is tricuspid. Aortic valve regurgitation is not  visualized. No aortic stenosis is present.  6. The inferior vena cava is dilated in size with <50% respiratory  variability, suggesting right atrial pressure of 15 mmHg.   Lower Venous DVT Study 08/06/20: RIGHT:  - There is no evidence of deep vein thrombosis in the lower extremity.    - No cystic structure found in the popliteal fossa.    - Pulsatile waveforms consistent with elevated right heart pressures.    LEFT:  - There is no evidence of deep vein thrombosis in the lower extremity.    - No cystic structure found in the popliteal fossa.    - Pulsatile waveforms consistent with elevated right heart pressures.  Recent Labs: 08/06/2020: TSH 0.858 10/14/2020: ALT 46; Hemoglobin 15.9; Platelets 170 10/16/2020: Magnesium 2.2 10/23/2020: BNP WILL FOLLOW; BUN 27;  Creatinine, Ser 1.05; Potassium 4.4; Sodium 141  Recent Lipid Panel    Component Value Date/Time   CHOL 153 10/14/2020 0835   TRIG 60 10/14/2020 0835   HDL 73 10/14/2020 0835   CHOLHDL 2.1 10/14/2020 0835   LDLCALC 66 10/14/2020 0835    Physical Exam:    VS:  BP 120/88   Pulse 86   Ht 6\' 1"  (1.854 m)   Wt 189 lb (85.7 kg)   SpO2 98%   BMI 24.94 kg/m     Wt Readings from Last 3 Encounters:  10/23/20 189 lb (85.7 kg)  10/16/20 185 lb 9.6 oz (84.2 kg)  09/16/20 179 lb (81.2 kg)     GEN:  Well nourished, well developed in no acute distress HEENT: Normal NECK: No JVD; No carotid bruits LYMPHATICS: No lymphadenopathy CARDIAC: RRR, no murmurs, rubs, gallops RESPIRATORY:  Clear to auscultation without rales, wheezing or rhonchi  ABDOMEN: Soft, non-tender, non-distended MUSCULOSKELETAL:  No edema; No deformity  SKIN: Warm and dry NEUROLOGIC:  Alert and oriented x 3 PSYCHIATRIC:  Normal affect   ASSESSMENT:    1. Acute right-sided heart failure (HCC)   2. Medication management   3. Acute pulmonary embolism with acute cor pulmonale, unspecified pulmonary embolism type (HCC)   4. Essential hypertension    PLAN:    Acute right Heart Failure, Diastolic dysfunction/cor pulmonale: during recent admission with bilateral PEs.  Echocardiogram on 08/05/2020 showed LVEF 55 to 60%, normal diastolic function, interventricular septal flattening in systole consistent with RV  pressure overload, normal RV function, severe RV dilatation, no significant valvular disease. -Developed AKI on torsemide, switched to as needed last week.  Advised to monitor daily weights and can take if gains more than 3 pounds in 1 day or 5 pounds in 1 week.  We will recheck BMP -Held losartan and spironolactone given AKI. -Repeat echo in 3 months to evaluate for improvement in RV with treatment of his PE  Acute bilateral pulmonary embolism:  Diagnosed on CTA chest 08/05/2020 -Continue Eliquis 5 mg twice  daily  Hypertension: Losartan and spironolactone held given AKI.  Switched to amlodipine 5 mg daily.  Appears controlled.  RTC in 2 months  Medication Adjustments/Labs and Tests Ordered: Current medicines are reviewed at length with the patient today.  Concerns regarding medicines are outlined above.  Orders Placed This Encounter  Procedures  . Basic metabolic panel  . Brain natriuretic peptide  . EKG 12-Lead   No orders of the defined types were placed in this encounter.   Patient Instructions  Medication Instructions:  Your physician recommends that you continue on your current medications as directed. Please refer to the Current Medication list given to you today.  *If you need a refill on your cardiac medications before your next appointment, please call your pharmacy*   Lab Work: BMET, BNP today  If you have labs (blood work) drawn today and your tests are completely normal, you will receive your results only by: Marland Kitchen MyChart Message (if you have MyChart) OR . A paper copy in the mail If you have any lab test that is abnormal or we need to change your treatment, we will call you to review the results.  Follow-Up: At [facility], you and your health needs are our priority.  As part of our continuing mission to provide you with exceptional heart care, we have created designated Provider Care Teams.  These Care Teams include your primary Cardiologist (physician) and Advanced Practice Providers (APPs -  Physician Assistants and Nurse Practitioners) who all work together to provide you with the care you need, when you need it.  We recommend signing up for the patient portal called "MyChart".  Sign up information is provided on this After Visit Summary.  MyChart is used to connect with patients for Virtual Visits (Telemedicine).  Patients are able to view lab/test results, encounter notes, upcoming appointments, etc.  Non-urgent messages can be sent to your provider as well.   To  learn more about what you can do with MyChart, go to [email or web address].    Your next appointment:   As scheduled in July with Dr. Nathanial Rancher Stumpf,acting as a scribe for Little Ishikawa, MD.,have documented all relevant documentation on the behalf of Little Ishikawa, MD,as directed by  Little Ishikawa, MD while in the presence of Little Ishikawa, MD.  I, Little Ishikawa, MD, have reviewed all documentation for this visit. The documentation on 10/24/20 for the exam, diagnosis, procedures, and orders are all accurate and complete.   Signed, Little Ishikawa, MD  10/24/2020 10:59 AM    [facility]

## 2020-10-23 NOTE — Patient Instructions (Signed)
Medication Instructions:  Your physician recommends that you continue on your current medications as directed. Please refer to the Current Medication list given to you today.  *If you need a refill on your cardiac medications before your next appointment, please call your pharmacy*   Lab Work: BMET, BNP today  If you have labs (blood work) drawn today and your tests are completely normal, you will receive your results only by: Marland Kitchen MyChart Message (if you have MyChart) OR . A paper copy in the mail If you have any lab test that is abnormal or we need to change your treatment, we will call you to review the results.  Follow-Up: At [facility], you and your health needs are our priority.  As part of our continuing mission to provide you with exceptional heart care, we have created designated Provider Care Teams.  These Care Teams include your primary Cardiologist (physician) and Advanced Practice Providers (APPs -  Physician Assistants and Nurse Practitioners) who all work together to provide you with the care you need, when you need it.  We recommend signing up for the patient portal called "MyChart".  Sign up information is provided on this After Visit Summary.  MyChart is used to connect with patients for Virtual Visits (Telemedicine).  Patients are able to view lab/test results, encounter notes, upcoming appointments, etc.  Non-urgent messages can be sent to your provider as well.   To learn more about what you can do with MyChart, go to [email or web address].    Your next appointment:   As scheduled in July with Dr. Bjorn Pippin

## 2020-10-24 ENCOUNTER — Other Ambulatory Visit (HOSPITAL_COMMUNITY): Payer: Self-pay

## 2020-10-24 LAB — BASIC METABOLIC PANEL
BUN/Creatinine Ratio: 26 — ABNORMAL HIGH (ref 10–24)
BUN: 27 mg/dL (ref 8–27)
CO2: 22 mmol/L (ref 20–29)
Calcium: 9.8 mg/dL (ref 8.6–10.2)
Chloride: 104 mmol/L (ref 96–106)
Creatinine, Ser: 1.05 mg/dL (ref 0.76–1.27)
Glucose: 121 mg/dL — ABNORMAL HIGH (ref 65–99)
Potassium: 4.4 mmol/L (ref 3.5–5.2)
Sodium: 141 mmol/L (ref 134–144)
eGFR: 76 mL/min/{1.73_m2} (ref >59)

## 2020-10-24 LAB — BRAIN NATRIURETIC PEPTIDE: BNP: 65.2 pg/mL (ref 0.0–100.0)

## 2020-10-27 ENCOUNTER — Encounter: Payer: Self-pay | Admitting: *Deleted

## 2020-12-12 ENCOUNTER — Other Ambulatory Visit (HOSPITAL_COMMUNITY): Payer: Medicare Other

## 2020-12-15 ENCOUNTER — Encounter (HOSPITAL_COMMUNITY): Payer: Self-pay | Admitting: Cardiology

## 2020-12-18 DIAGNOSIS — J449 Chronic obstructive pulmonary disease, unspecified: Secondary | ICD-10-CM | POA: Diagnosis not present

## 2020-12-22 NOTE — Progress Notes (Deleted)
Cardiology Office Note:    Date:  12/22/2020   ID:  Mitchell King, DOB [birth year], MRN [id number]  PCP:  Octavia Heir, NP  Cardiologist:  None  Electrophysiologist:  None   Referring MD: Octavia Heir, NP   No chief complaint on file.   History of Present Illness:    Mitchell King is a 70 y.o. male with a hx of PE, RV failure, T2DM, tobacco use who presents for hospital follow-up.  He was admitted from 2/14 through 08/16/2020 to [facility]  He was admitted with bilateral PE and right heart failure/cor pulmonale treated with diuretics and anticoagulation.  During admission he developed worsening encephalopathy secondary to hypercarbia, requiring intubation.  Improved with diuresis.  He was discharged on torsemide, spironolactone, losartan, and Eliquis.  Echocardiogram on 08/05/2020 showed LVEF 55 to 60%, normal diastolic function, interventricular septal flattening in systole consistent with RV pressure overload, normal RV function, severe RV dilatation, no significant valvular disease.  Since last clinic visit,   he reports that he has been doing well.  Torsemide was discontinued due to AKI on 10/17/2020.  He has not noticed any lower extremity edema.  For exercise, he does squats, leg exercises, and walks throughout the house and outside as well. He does not have exertional symptoms. He denies any bleeding issues, no hematuria or blood in his stool. He denies any chest pain, shortness of breath, lightheadedness, or syncope.  Past Medical History:  Diagnosis Date   CHF (congestive heart failure) (HCC)    COPD (chronic obstructive pulmonary disease) (HCC)    Diabetes mellitus without complication (HCC)    Dyspnea    Hypertension     Past Surgical History:  Procedure Laterality Date   NO PAST SURGERIES      Current Medications: No outpatient medications have been marked as taking for the 12/23/20 encounter (Appointment) with Little Ishikawa, MD.     Allergies:   Patient has no  known allergies.   Social History   Socioeconomic History   Marital status: Divorced    Spouse name: Not on file   Number of children: Not on file   Years of education: Not on file   Highest education level: Not on file  Occupational History   Not on file  Tobacco Use   Smoking status: Former    Packs/day: 1.50    Years: 40.00    Pack years: 60.00    Types: Cigarettes   Smokeless tobacco: Never  Vaping Use   Vaping Use: Never used  Substance and Sexual Activity   Alcohol use: No    Comment: non x 5 years    Drug use: No   Sexual activity: Not Currently  Other Topics Concern   Not on file  Social History Narrative   Diet: Blank       Do you drink/ eat things with caffeine? No      Marital status:   Divorced                            What year were you married ? Blank      Do you live in a house, apartment,assistred living, condo, trailer, etc.)? House      Is it one or more stories? 1      How many persons live in your home ? 1      Do you have any pets in your home ?(please list) Dog  Highest Level of education completed: 12 th      Current or past profession: Blank      Do you exercise?    No                          Type & how often  Blank      ADVANCED DIRECTIVES (Please bring copies)      Do you have a living will? No      Do you have a DNR form? No                      If not, do you want to discuss one? No      Do you have signed POA?HPOA forms?  No               If so, please bring to your appointment No      FUNCTIONAL STATUS- To be completed by Spouse / child / Staff       Do you have difficulty bathing or dressing yourself ?  No        Do you have difficulty preparing food or eating ?  No      Do you have difficulty managing your mediation ?  No      Do you have difficulty managing your finances ?  No      Do you have difficulty affording your medication ?  No      Social Determinants of Corporate investment banker Strain: Not on  file  Food Insecurity: Not on file  Transportation Needs: Not on file  Physical Activity: Not on file  Stress: Not on file  Social Connections: Not on file     Family History: The patient's family history includes Heart attack in his brother.  ROS:   Please see the history of present illness.    All other systems reviewed and are negative.  EKGs/Labs/Other Studies Reviewed:    The following studies were reviewed today:  EKG:   09/16/20: NSR, rate 71, TWI v1-3 10/23/20: NSR, Rate 86 bpm. PACs, Non specific T wave flattening. Left axis deviation.  Echo 08/05/2020: 1. Left ventricular ejection fraction, by estimation, is 55 to 60%. The  left ventricle has normal function. The left ventricle has no regional  wall motion abnormalities. There is mild concentric left ventricular  hypertrophy. Left ventricular diastolic  parameters were normal. There is the interventricular septum is flattened  in systole, consistent with right ventricular pressure overload.   2. Right ventricular systolic function is normal. The right ventricular  size is severely enlarged. Moderately increased right ventricular wall  thickness. There is mildly elevated pulmonary artery systolic pressure.  The estimated right ventricular  systolic pressure is 44.4 mmHg (suspect this is an underestimation due to  the faint TR jet).   3. Right atrial size was severely dilated.   4. The mitral valve is normal in structure. No evidence of mitral valve  regurgitation. No evidence of mitral stenosis.   5. The aortic valve is tricuspid. Aortic valve regurgitation is not  visualized. No aortic stenosis is present.   6. The inferior vena cava is dilated in size with <50% respiratory  variability, suggesting right atrial pressure of 15 mmHg.   Lower Venous DVT Study 08/06/20: RIGHT:  - There is no evidence of deep vein thrombosis in the lower extremity.     - No cystic structure found in the  popliteal fossa.     -  Pulsatile waveforms consistent with elevated right heart pressures.     LEFT:  - There is no evidence of deep vein thrombosis in the lower extremity.     - No cystic structure found in the popliteal fossa.     - Pulsatile waveforms consistent with elevated right heart pressures.  Recent Labs: 08/06/2020: TSH 0.858 10/14/2020: ALT 46; Hemoglobin 15.9; Platelets 170 10/16/2020: Magnesium 2.2 10/23/2020: BNP 65.2; BUN 27; Creatinine, Ser 1.05; Potassium 4.4; Sodium 141  Recent Lipid Panel    Component Value Date/Time   CHOL 153 10/14/2020 0835   TRIG 60 10/14/2020 0835   HDL 73 10/14/2020 0835   CHOLHDL 2.1 10/14/2020 0835   LDLCALC 66 10/14/2020 0835    Physical Exam:    VS:  There were no vitals taken for this visit.    Wt Readings from Last 3 Encounters:  10/23/20 189 lb (85.7 kg)  10/16/20 185 lb 9.6 oz (84.2 kg)  09/16/20 179 lb (81.2 kg)     GEN:  Well nourished, well developed in no acute distress HEENT: Normal NECK: No JVD; No carotid bruits LYMPHATICS: No lymphadenopathy CARDIAC: RRR, no murmurs, rubs, gallops RESPIRATORY:  Clear to auscultation without rales, wheezing or rhonchi  ABDOMEN: Soft, non-tender, non-distended MUSCULOSKELETAL:  No edema; No deformity  SKIN: Warm and dry NEUROLOGIC:  Alert and oriented x 3 PSYCHIATRIC:  Normal affect   ASSESSMENT:    No diagnosis found.  PLAN:    Acute right Heart Failure, Diastolic dysfunction/cor pulmonale: during recent admission with bilateral PEs.  Echocardiogram on 08/05/2020 showed LVEF 55 to 60%, normal diastolic function, interventricular septal flattening in systole consistent with RV pressure overload, normal RV function, severe RV dilatation, no significant valvular disease. -Developed AKI on torsemide, switched to as needed last week.  Advised to monitor daily weights and can take if gains more than 3 pounds in 1 day or 5 pounds in 1 week.  We will recheck BMP -Held losartan and spironolactone given  AKI. -Repeat echo in 3 months to evaluate for improvement in RV with treatment of his PE   Acute bilateral pulmonary embolism:  Diagnosed on CTA chest 08/05/2020 -Continue Eliquis 5 mg twice daily  Hypertension: Losartan and spironolactone held given AKI.  Switched to amlodipine 5 mg daily.  Appears controlled.  RTC in ***  Medication Adjustments/Labs and Tests Ordered: Current medicines are reviewed at length with the patient today.  Concerns regarding medicines are outlined above.  No orders of the defined types were placed in this encounter.  No orders of the defined types were placed in this encounter.   There are no Patient Instructions on file for this visit.   I,Mathew Stumpf,acting as a Neurosurgeon for Little Ishikawa, MD.,have documented all relevant documentation on the behalf of Little Ishikawa, MD,as directed by  Little Ishikawa, MD while in the presence of Little Ishikawa, MD.  I, Little Ishikawa, MD, have reviewed all documentation for this visit. The documentation on 12/22/20 for the exam, diagnosis, procedures, and orders are all accurate and complete.   Signed, Little Ishikawa, MD  12/22/2020 2:58 PM    [facility]

## 2020-12-23 ENCOUNTER — Ambulatory Visit: Payer: Medicare Other | Admitting: Cardiology

## 2020-12-26 ENCOUNTER — Telehealth (HOSPITAL_COMMUNITY): Payer: Self-pay | Admitting: Cardiology

## 2020-12-26 NOTE — Telephone Encounter (Signed)
Just an FYI. We have made several attempts to contact this patient including sending a letter to schedule or reschedule their echocardiogram. We will be removing the patient from the echo WQ.  12/12/20 NO SHOWED-MAILED LETTER [facility]      Thank you

## 2021-01-12 ENCOUNTER — Other Ambulatory Visit: Payer: Medicare Other

## 2021-01-13 ENCOUNTER — Other Ambulatory Visit: Payer: Medicare Other

## 2021-01-13 ENCOUNTER — Other Ambulatory Visit: Payer: Self-pay

## 2021-01-13 DIAGNOSIS — R809 Proteinuria, unspecified: Secondary | ICD-10-CM | POA: Diagnosis not present

## 2021-01-13 DIAGNOSIS — E119 Type 2 diabetes mellitus without complications: Secondary | ICD-10-CM

## 2021-01-13 DIAGNOSIS — I5033 Acute on chronic diastolic (congestive) heart failure: Secondary | ICD-10-CM

## 2021-01-13 DIAGNOSIS — I10 Essential (primary) hypertension: Secondary | ICD-10-CM | POA: Diagnosis not present

## 2021-01-14 LAB — MICROALBUMIN / CREATININE URINE RATIO
Creatinine, Urine: 119 mg/dL (ref 20–320)
Microalb Creat Ratio: 244 mcg/mg creat — ABNORMAL HIGH (ref <30)
Microalb, Ur: 29 mg/dL

## 2021-01-14 LAB — CBC WITH DIFFERENTIAL/PLATELET
Absolute Monocytes: 787 cells/uL (ref 200–950)
Basophils Absolute: 23 cells/uL (ref 0–200)
Basophils Relative: 0.4 %
Eosinophils Absolute: 182 cells/uL (ref 15–500)
Eosinophils Relative: 3.2 %
HCT: 36.6 % — ABNORMAL LOW (ref 38.5–50.0)
Hemoglobin: 12.1 g/dL — ABNORMAL LOW (ref 13.2–17.1)
Lymphs Abs: 1146 cells/uL (ref 850–3900)
MCH: 30.4 pg (ref 27.0–33.0)
MCHC: 33.1 g/dL (ref 32.0–36.0)
MCV: 92 fL (ref 80.0–100.0)
MPV: 10.1 fL (ref 7.5–12.5)
Monocytes Relative: 13.8 %
Neutro Abs: 3563 cells/uL (ref 1500–7800)
Neutrophils Relative %: 62.5 %
Platelets: 264 10*3/uL (ref 140–400)
RBC: 3.98 10*6/uL — ABNORMAL LOW (ref 4.20–5.80)
RDW: 11.8 % (ref 11.0–15.0)
Total Lymphocyte: 20.1 %
WBC: 5.7 10*3/uL (ref 3.8–10.8)

## 2021-01-14 LAB — COMPREHENSIVE METABOLIC PANEL
AG Ratio: 1 (calc) (ref 1.0–2.5)
ALT: 18 U/L (ref 9–46)
AST: 17 U/L (ref 10–35)
Albumin: 4 g/dL (ref 3.6–5.1)
Alkaline phosphatase (APISO): 81 U/L (ref 35–144)
BUN: 22 mg/dL (ref 7–25)
CO2: 30 mmol/L (ref 20–32)
Calcium: 9.5 mg/dL (ref 8.6–10.3)
Chloride: 102 mmol/L (ref 98–110)
Creat: 1.11 mg/dL (ref 0.70–1.28)
Globulin: 4.1 g/dL (calc) — ABNORMAL HIGH (ref 1.9–3.7)
Glucose, Bld: 111 mg/dL — ABNORMAL HIGH (ref 65–99)
Potassium: 4.3 mmol/L (ref 3.5–5.3)
Sodium: 137 mmol/L (ref 135–146)
Total Bilirubin: 0.5 mg/dL (ref 0.2–1.2)
Total Protein: 8.1 g/dL (ref 6.1–8.1)

## 2021-01-14 LAB — HEMOGLOBIN A1C
Hgb A1c MFr Bld: 5.5 % of total Hgb (ref <5.7)
Mean Plasma Glucose: 111 mg/dL
eAG (mmol/L): 6.2 mmol/L

## 2021-01-15 ENCOUNTER — Ambulatory Visit: Payer: Medicare Other | Admitting: Orthopedic Surgery

## 2021-01-17 DIAGNOSIS — J449 Chronic obstructive pulmonary disease, unspecified: Secondary | ICD-10-CM | POA: Diagnosis not present

## 2021-01-22 ENCOUNTER — Ambulatory Visit (INDEPENDENT_AMBULATORY_CARE_PROVIDER_SITE_OTHER): Payer: Medicare Other | Admitting: Orthopedic Surgery

## 2021-01-22 ENCOUNTER — Other Ambulatory Visit: Payer: Self-pay

## 2021-01-22 ENCOUNTER — Encounter: Payer: Self-pay | Admitting: Orthopedic Surgery

## 2021-01-22 VITALS — BP 140/80 | HR 90 | Temp 97.8°F | Ht 73.0 in | Wt 214.0 lb

## 2021-01-22 DIAGNOSIS — E119 Type 2 diabetes mellitus without complications: Secondary | ICD-10-CM

## 2021-01-22 DIAGNOSIS — J449 Chronic obstructive pulmonary disease, unspecified: Secondary | ICD-10-CM | POA: Diagnosis not present

## 2021-01-22 DIAGNOSIS — F1010 Alcohol abuse, uncomplicated: Secondary | ICD-10-CM | POA: Diagnosis not present

## 2021-01-22 DIAGNOSIS — I5033 Acute on chronic diastolic (congestive) heart failure: Secondary | ICD-10-CM

## 2021-01-22 DIAGNOSIS — I10 Essential (primary) hypertension: Secondary | ICD-10-CM

## 2021-01-22 DIAGNOSIS — G8929 Other chronic pain: Secondary | ICD-10-CM | POA: Diagnosis not present

## 2021-01-22 DIAGNOSIS — M545 Low back pain, unspecified: Secondary | ICD-10-CM | POA: Diagnosis not present

## 2021-01-22 DIAGNOSIS — I2609 Other pulmonary embolism with acute cor pulmonale: Secondary | ICD-10-CM | POA: Diagnosis not present

## 2021-01-22 DIAGNOSIS — R635 Abnormal weight gain: Secondary | ICD-10-CM

## 2021-01-22 DIAGNOSIS — I700 Atherosclerosis of aorta: Secondary | ICD-10-CM | POA: Diagnosis not present

## 2021-01-22 DIAGNOSIS — D649 Anemia, unspecified: Secondary | ICD-10-CM

## 2021-01-22 NOTE — Progress Notes (Signed)
Careteam: Patient Care Team: Octavia HeirFargo, Nicholl Onstott E, NP as PCP - General (Adult Health Nurse Practitioner)  Seen by: Hazle NordmannAmy Prachi Oftedahl, AGNP-C  [facility]  Advanced Directive information    No Known Allergies  Chief Complaint  Patient presents with   Medical Management of Chronic Issues    Patient presents today for a 3 month follow-up.     HPI: Patient is a 70 y.o. male seen today for medical management of chronic conditions.   Labs reviewed with patient.   Discussed low hemoglobin. He admits to black tarry stools. [facility]. Reports they planned to schedule him with a gastroenterologist for colonscopy. At this time procedure has not been scheduled. Denies abdominal pain. Refusing to have rectal exam done and hemoccult today. Denies family history of colon cancer.   [facility]   Continues to wear 2 liters oxygen. Reports some sob when he does not wear oxygen and tries to take out the trash. He denies smoking cigarettes since 07/2020. He had not seen a pulmonary specialist since last visit.   He has gained about 25 lbs. Snacking more since quitting smoking.   Continues to live with son.   He is now retired. 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   Home health PT stopped in May 2022. Ambulates with rollator. No recent injuries or falls.  He plans to get second covid booster and flu shot this fall.   Still plans to schedule eye exam.    Review of Systems:  Review of Systems  Constitutional:  Negative for chills, fever, malaise/fatigue and weight loss.  HENT: Negative.    Eyes: Negative.   Respiratory:  Positive for cough, sputum production and shortness of breath. Negative for wheezing.        Oxygen use  Cardiovascular:  Negative for chest pain, palpitations and leg swelling.  Gastrointestinal:  Positive for blood in stool. Negative for abdominal pain, constipation, diarrhea, heartburn, nausea  and vomiting.  Genitourinary: Negative.   Musculoskeletal:  Positive for back pain. Negative for falls.  Neurological:  Positive for tremors. Negative for dizziness, tingling and weakness.  Psychiatric/Behavioral:  Negative for depression and memory loss. The patient is not nervous/anxious and does not have insomnia.    Past Medical History:  Diagnosis Date   CHF (congestive heart failure) (HCC)    COPD (chronic obstructive pulmonary disease) (HCC)    Diabetes mellitus without complication (HCC)    Dyspnea    Hypertension    Past Surgical History:  Procedure Laterality Date   NO PAST SURGERIES     Social History:   reports that he has quit smoking. His smoking use included cigarettes. He has a 60.00 pack-year smoking history. He has never used smokeless tobacco. He reports that he does not drink alcohol and does not use drugs.  Family History  Problem Relation Age of Onset   Heart attack Brother     Medications: Patient's Medications  New Prescriptions   No medications on file  Previous Medications   ACETAMINOPHEN (TYLENOL) 500 MG TABLET    Take 1,000 mg by mouth every 8 (eight) hours as needed for mild pain or headache (or headaches).   ALBUTEROL (VENTOLIN HFA) 108 (90 BASE) MCG/ACT INHALER    Inhale 2 puffs into the lungs every 4 (four) hours as needed for wheezing or shortness of breath.   AMLODIPINE (NORVASC) 5 MG TABLET  Take 1 tablet (5 mg total) by mouth daily.   APIXABAN (ELIQUIS) 5 MG TABS TABLET    Take 1 tablet (5 mg total) by mouth 2 (two) times daily.   IPRATROPIUM-ALBUTEROL (DUONEB) 0.5-2.5 (3) MG/3ML SOLN    Take 3 mLs by nebulization every 4 (four) hours as needed.   LACTULOSE (CHRONULAC) 10 GM/15ML SOLUTION    Take 30 mLs (20 g total) by mouth 2 (two) times daily.  Modified Medications   No medications on file  Discontinued Medications   GUAIFENESIN (ROBITUSSIN) 100 MG/5ML SYRUP    Take 100 mg by mouth at bedtime as needed (for chest congestion).     Physical Exam:  Vitals:   01/22/21 1008  Weight: 214 lb (97.1 kg)  Height: 6\' 1"  (1.854 m)   Body mass index is 28.23 kg/m. Wt Readings from Last 3 Encounters:  01/22/21 214 lb (97.1 kg)  10/23/20 189 lb (85.7 kg)  10/16/20 185 lb 9.6 oz (84.2 kg)    Physical Exam Vitals reviewed.  Constitutional:      General: He is not in acute distress. HENT:     Head: Normocephalic.  Cardiovascular:     Rate and Rhythm: Normal rate and regular rhythm.     Pulses: Normal pulses.     Heart sounds: Normal heart sounds. No murmur heard. Pulmonary:     Effort: Pulmonary effort is normal. No respiratory distress.     Breath sounds: Normal breath sounds. No wheezing.     Comments: 2 liters oxygen Abdominal:     General: Bowel sounds are normal. There is no distension.     Palpations: Abdomen is soft.     Tenderness: There is no abdominal tenderness.     Comments: Refused rectal exam and hemoccult  Musculoskeletal:     Right lower leg: No edema.     Left lower leg: No edema.  Skin:    General: Skin is warm and dry.     Capillary Refill: Capillary refill takes less than 2 seconds.  Neurological:     General: No focal deficit present.     Mental Status: He is alert and oriented to person, place, and time.     Motor: Weakness present.     Gait: Gait abnormal.     Comments: Rollator, resting hand tremor  Psychiatric:        Mood and Affect: Mood normal.        Behavior: Behavior normal.    Labs reviewed: Basic Metabolic Panel: Recent Labs    08/06/20 0353 08/07/20 0311 08/09/20 1835 08/10/20 0546 08/10/20 1602 08/11/20 0033 08/13/20 0326 08/13/20 1044 09/16/20 1025 10/16/20 1511 10/23/20 1226 01/13/21 1026  NA 137   < >  --  141  --    < > 141   < > 140 134* 141 137  K 4.7   < >  --  3.3*  --    < > 2.6*   < > 5.0 4.7 4.4 4.3  CL 93*   < >  --  101  --    < > 93*   < > 95* 99 104 102  CO2 34*   < >  --  31  --    < > 38*   < > 30* 28 22 30   GLUCOSE 113*   < >  --   140*  --    < > 57*   < > 96 89 121* 111*  BUN 23   < >  --  48*  --    < > 37*   < > 24 78* 27 22  CREATININE 0.94   < >  --  1.18  --    < > 1.08   < > 1.13 2.59* 1.05 1.11  CALCIUM 9.2   < >  --  8.5*  --    < > 9.1   < > 10.1 9.9 9.8 9.5  MG  --    < > 2.2 2.3 2.6*  --  2.1  --  1.7 2.2  --   --   PHOS  --    < > 3.5 4.3 5.0*  --   --   --   --   --   --   --   TSH 0.858  --   --   --   --   --   --   --   --   --   --   --    < > = values in this interval not displayed.   Liver Function Tests: Recent Labs    08/10/20 0546 08/11/20 0033 08/13/20 0326 10/14/20 0835 01/13/21 1026  AST 18 22 19  32 17  ALT 12 14 13  46 18  ALKPHOS 50 57 59  --   --   BILITOT 2.0* 1.8* 2.5* 0.7 0.5  PROT 6.9 7.7 7.2 8.8* 8.1  ALBUMIN 2.8* 3.1* 2.9*  --   --    No results for input(s): LIPASE, AMYLASE in the last 8760 hours. Recent Labs    08/07/20 0733 08/08/20 0321 08/12/20 0853  AMMONIA 75* 30 30   CBC: Recent Labs    08/15/20 0959 10/14/20 0835 01/13/21 1026  WBC 4.1 4.3 5.7  NEUTROABS  --  1,789 3,563  HGB 16.4 15.9 12.1*  HCT 51.9 47.9 36.6*  MCV 96.1 92.5 92.0  PLT 152 170 264   Lipid Panel: Recent Labs    10/14/20 0835  CHOL 153  HDL 73  LDLCALC 66  TRIG 60  CHOLHDL 2.1   TSH: Recent Labs    08/06/20 0353  TSH 0.858   A1C: Lab Results  Component Value Date   HGBA1C 5.5 01/13/2021     Assessment/Plan 1. Low hemoglobin - hgb 12.1 01/13/2021, was 15.9 10/14/2020 - admits to black tarry stools - refused rectal exam and hemoccult today - [facility], he has not been contacted to schedule - cbc/diff- future  2. Essential hypertension - controlled - cont amlodipine 5 mg daily - cont low sodium diet  3. Acute on chronic diastolic congestive heart failure (HCC) - followed by cardiology - BNP 65.2 10/23/2020 - echo 02/15 LVEF 55-60% - lung sounds clear, no lower ankle edema - remains off diuretics  4. Nondependent  alcohol abuse, continuous drinking behavior - stable with lactulose  5. Type 2 diabetes mellitus without complication, without long-term current use of insulin (HCC) - A1c 5.5 01/13/2021 - microalbumin 244  - a1c- future - cmp- future  6. Chronic obstructive pulmonary disease, unspecified COPD type (HCC) - due to > 20 years heavy smoking - quit smoking 07/2020 - sob with exertion, wearing 2 liters oxygen at all times - cont prn albuterol and dunebs - cont daily spiriva - recommend pulmonary consult- [facility]  7. Aortic atherosclerosis (HCC) - noted on CT chest 08/05/2020 - not on statin- LDL 66 10/14/2020 - lipid panel- future  8. Acute pulmonary embolism with acute cor  pulmonale, unspecified pulmonary embolism type (HCC) - remains on eliquis 5 mg po bid for clot prevention - recommend pulmonary specialist to determine length of DOAC use- [facility]  9. Weight gain - he has gained 25 lbs since last visit - reports smoking cessation has made him snack - advised to count calories, recommend diet < 2200 calories  10. Chronic bilateral low back pain without sciatica - intermittent lower back pain daily - advised to avoid NSAIDS - recommend tylenol [phone] mg po bid prn for pain - recommend stretching and light walking   Total time: 35 minutes. Greater than 50 % of total time spent doing patient education on symptom management, medication management and health maintenance.   Next appt: 05/28/2021  Hazle Nordmann, Juel Burrow  [facility] [phone]

## 2021-01-22 NOTE — Patient Instructions (Addendum)
Please contact gastroenterologist and schedule colonoscopy.  Please tell them you have a lower hemoglobin and black tarry stools.   Tylenol is ok to take for back pain- please avoid ibuprofen or naproxen. Ok to take tylenol [phone] mg twice daily for back pain.   Consider seeing a pulmonary doctor to assess lung function. [facility]. Please ask them about low density CT chest to rule out lung cancer.

## 2021-02-17 DIAGNOSIS — J449 Chronic obstructive pulmonary disease, unspecified: Secondary | ICD-10-CM | POA: Diagnosis not present

## 2021-03-20 DIAGNOSIS — J449 Chronic obstructive pulmonary disease, unspecified: Secondary | ICD-10-CM | POA: Diagnosis not present

## 2021-04-13 ENCOUNTER — Encounter (HOSPITAL_COMMUNITY): Payer: Self-pay

## 2021-04-13 ENCOUNTER — Emergency Department (HOSPITAL_COMMUNITY): Payer: Medicare Other

## 2021-04-13 ENCOUNTER — Other Ambulatory Visit: Payer: Self-pay

## 2021-04-13 ENCOUNTER — Inpatient Hospital Stay (HOSPITAL_COMMUNITY)
Admission: EM | Admit: 2021-04-13 | Discharge: 2021-04-17 | DRG: 291 | Disposition: A | Discharge status: Home Health | Payer: Medicare Other | Attending: Student in an Organized Health Care Education/Training Program | Admitting: Student in an Organized Health Care Education/Training Program

## 2021-04-13 DIAGNOSIS — Z7901 Long term (current) use of anticoagulants: Secondary | ICD-10-CM | POA: Diagnosis not present

## 2021-04-13 DIAGNOSIS — Z79899 Other long term (current) drug therapy: Secondary | ICD-10-CM | POA: Diagnosis not present

## 2021-04-13 DIAGNOSIS — D508 Other iron deficiency anemias: Secondary | ICD-10-CM | POA: Diagnosis not present

## 2021-04-13 DIAGNOSIS — R0602 Shortness of breath: Secondary | ICD-10-CM | POA: Diagnosis not present

## 2021-04-13 DIAGNOSIS — R600 Localized edema: Secondary | ICD-10-CM

## 2021-04-13 DIAGNOSIS — D6832 Hemorrhagic disorder due to extrinsic circulating anticoagulants: Secondary | ICD-10-CM | POA: Diagnosis present

## 2021-04-13 DIAGNOSIS — I10 Essential (primary) hypertension: Secondary | ICD-10-CM | POA: Diagnosis present

## 2021-04-13 DIAGNOSIS — Z86711 Personal history of pulmonary embolism: Secondary | ICD-10-CM | POA: Diagnosis not present

## 2021-04-13 DIAGNOSIS — I50811 Acute right heart failure: Secondary | ICD-10-CM | POA: Diagnosis not present

## 2021-04-13 DIAGNOSIS — I517 Cardiomegaly: Secondary | ICD-10-CM | POA: Diagnosis not present

## 2021-04-13 DIAGNOSIS — E119 Type 2 diabetes mellitus without complications: Secondary | ICD-10-CM | POA: Diagnosis not present

## 2021-04-13 DIAGNOSIS — J9621 Acute and chronic respiratory failure with hypoxia: Secondary | ICD-10-CM | POA: Diagnosis not present

## 2021-04-13 DIAGNOSIS — N179 Acute kidney failure, unspecified: Secondary | ICD-10-CM | POA: Diagnosis present

## 2021-04-13 DIAGNOSIS — J438 Other emphysema: Secondary | ICD-10-CM | POA: Diagnosis not present

## 2021-04-13 DIAGNOSIS — Z8619 Personal history of other infectious and parasitic diseases: Secondary | ICD-10-CM | POA: Diagnosis not present

## 2021-04-13 DIAGNOSIS — I50813 Acute on chronic right heart failure: Secondary | ICD-10-CM | POA: Diagnosis not present

## 2021-04-13 DIAGNOSIS — D649 Anemia, unspecified: Secondary | ICD-10-CM | POA: Diagnosis not present

## 2021-04-13 DIAGNOSIS — Z20822 Contact with and (suspected) exposure to covid-19: Secondary | ICD-10-CM | POA: Diagnosis present

## 2021-04-13 DIAGNOSIS — Z7951 Long term (current) use of inhaled steroids: Secondary | ICD-10-CM | POA: Diagnosis not present

## 2021-04-13 DIAGNOSIS — J441 Chronic obstructive pulmonary disease with (acute) exacerbation: Secondary | ICD-10-CM | POA: Diagnosis present

## 2021-04-13 DIAGNOSIS — I110 Hypertensive heart disease with heart failure: Principal | ICD-10-CM | POA: Diagnosis present

## 2021-04-13 DIAGNOSIS — J449 Chronic obstructive pulmonary disease, unspecified: Secondary | ICD-10-CM | POA: Diagnosis not present

## 2021-04-13 DIAGNOSIS — I5023 Acute on chronic systolic (congestive) heart failure: Secondary | ICD-10-CM | POA: Diagnosis not present

## 2021-04-13 DIAGNOSIS — Z87891 Personal history of nicotine dependence: Secondary | ICD-10-CM | POA: Diagnosis not present

## 2021-04-13 DIAGNOSIS — I50810 Right heart failure, unspecified: Secondary | ICD-10-CM

## 2021-04-13 DIAGNOSIS — J90 Pleural effusion, not elsewhere classified: Secondary | ICD-10-CM | POA: Diagnosis not present

## 2021-04-13 DIAGNOSIS — D509 Iron deficiency anemia, unspecified: Secondary | ICD-10-CM | POA: Diagnosis not present

## 2021-04-13 DIAGNOSIS — Z8249 Family history of ischemic heart disease and other diseases of the circulatory system: Secondary | ICD-10-CM | POA: Diagnosis not present

## 2021-04-13 DIAGNOSIS — I509 Heart failure, unspecified: Secondary | ICD-10-CM

## 2021-04-13 DIAGNOSIS — J9811 Atelectasis: Secondary | ICD-10-CM | POA: Diagnosis not present

## 2021-04-13 DIAGNOSIS — I272 Pulmonary hypertension, unspecified: Secondary | ICD-10-CM | POA: Diagnosis present

## 2021-04-13 DIAGNOSIS — Z9981 Dependence on supplemental oxygen: Secondary | ICD-10-CM

## 2021-04-13 DIAGNOSIS — T45515A Adverse effect of anticoagulants, initial encounter: Secondary | ICD-10-CM | POA: Diagnosis not present

## 2021-04-13 DIAGNOSIS — R0609 Other forms of dyspnea: Secondary | ICD-10-CM | POA: Diagnosis not present

## 2021-04-13 DIAGNOSIS — R059 Cough, unspecified: Secondary | ICD-10-CM | POA: Diagnosis not present

## 2021-04-13 LAB — PROTIME-INR
INR: 1.5 — ABNORMAL HIGH (ref 0.8–1.2)
Prothrombin Time: 18.4 seconds — ABNORMAL HIGH (ref 11.4–15.2)

## 2021-04-13 LAB — CBC
HCT: 28.2 % — ABNORMAL LOW (ref 39.0–52.0)
Hemoglobin: 8.3 g/dL — ABNORMAL LOW (ref 13.0–17.0)
MCH: 25.9 pg — ABNORMAL LOW (ref 26.0–34.0)
MCHC: 29.4 g/dL — ABNORMAL LOW (ref 30.0–36.0)
MCV: 88.1 fL (ref 80.0–100.0)
Platelets: 306 10*3/uL (ref 150–400)
RBC: 3.2 MIL/uL — ABNORMAL LOW (ref 4.22–5.81)
RDW: 15.6 % — ABNORMAL HIGH (ref 11.5–15.5)
WBC: 4.4 10*3/uL (ref 4.0–10.5)
nRBC: 2 % — ABNORMAL HIGH (ref 0.0–0.2)

## 2021-04-13 LAB — COMPREHENSIVE METABOLIC PANEL
ALT: 21 U/L (ref 0–44)
AST: 26 U/L (ref 15–41)
Albumin: 3.7 g/dL (ref 3.5–5.0)
Alkaline Phosphatase: 55 U/L (ref 38–126)
Anion gap: 6 (ref 5–15)
BUN: 22 mg/dL (ref 8–23)
CO2: 30 mmol/L (ref 22–32)
Calcium: 8.9 mg/dL (ref 8.9–10.3)
Chloride: 98 mmol/L (ref 98–111)
Creatinine, Ser: 1.31 mg/dL — ABNORMAL HIGH (ref 0.61–1.24)
GFR, Estimated: 59 mL/min — ABNORMAL LOW (ref >60)
Glucose, Bld: 85 mg/dL (ref 70–99)
Potassium: 4.6 mmol/L (ref 3.5–5.1)
Sodium: 134 mmol/L — ABNORMAL LOW (ref 135–145)
Total Bilirubin: 1.1 mg/dL (ref 0.3–1.2)
Total Protein: 8.2 g/dL — ABNORMAL HIGH (ref 6.5–8.1)

## 2021-04-13 LAB — CBC WITH DIFFERENTIAL/PLATELET
Abs Immature Granulocytes: 0.02 10*3/uL (ref 0.00–0.07)
Basophils Absolute: 0 10*3/uL (ref 0.0–0.1)
Basophils Relative: 1 %
Eosinophils Absolute: 0.1 10*3/uL (ref 0.0–0.5)
Eosinophils Relative: 2 %
HCT: 28.1 % — ABNORMAL LOW (ref 39.0–52.0)
Hemoglobin: 8.3 g/dL — ABNORMAL LOW (ref 13.0–17.0)
Immature Granulocytes: 1 %
Lymphocytes Relative: 20 %
Lymphs Abs: 0.9 10*3/uL (ref 0.7–4.0)
MCH: 26.2 pg (ref 26.0–34.0)
MCHC: 29.5 g/dL — ABNORMAL LOW (ref 30.0–36.0)
MCV: 88.6 fL (ref 80.0–100.0)
Monocytes Absolute: 0.7 10*3/uL (ref 0.1–1.0)
Monocytes Relative: 17 %
Neutro Abs: 2.7 10*3/uL (ref 1.7–7.7)
Neutrophils Relative %: 59 %
Platelets: 307 10*3/uL (ref 150–400)
RBC: 3.17 MIL/uL — ABNORMAL LOW (ref 4.22–5.81)
RDW: 15.4 % (ref 11.5–15.5)
WBC: 4.4 10*3/uL (ref 4.0–10.5)
nRBC: 2.5 % — ABNORMAL HIGH (ref 0.0–0.2)

## 2021-04-13 LAB — BRAIN NATRIURETIC PEPTIDE: B Natriuretic Peptide: 452.4 pg/mL — ABNORMAL HIGH (ref 0.0–100.0)

## 2021-04-13 LAB — RESP PANEL BY RT-PCR (FLU A&B, COVID) ARPGX2
Influenza A by PCR: NEGATIVE
Influenza B by PCR: NEGATIVE
SARS Coronavirus 2 by RT PCR: NEGATIVE

## 2021-04-13 LAB — TROPONIN I (HIGH SENSITIVITY)
Troponin I (High Sensitivity): 43 ng/L — ABNORMAL HIGH (ref <18)
Troponin I (High Sensitivity): 45 ng/L — ABNORMAL HIGH (ref <18)

## 2021-04-13 LAB — LACTIC ACID, PLASMA: Lactic Acid, Venous: 1.2 mmol/L (ref 0.5–1.9)

## 2021-04-13 LAB — ABO/RH: ABO/RH(D): O POS

## 2021-04-13 LAB — APTT: aPTT: 52 seconds — ABNORMAL HIGH (ref 24–36)

## 2021-04-13 LAB — TYPE AND SCREEN
ABO/RH(D): O POS
Antibody Screen: NEGATIVE

## 2021-04-13 LAB — MAGNESIUM: Magnesium: 2 mg/dL (ref 1.7–2.4)

## 2021-04-13 MED ORDER — ALBUTEROL SULFATE (2.5 MG/3ML) 0.083% IN NEBU: 2.5000 mg | INHALATION_SOLUTION | RESPIRATORY_TRACT | Status: DC | PRN

## 2021-04-13 MED ORDER — FUROSEMIDE 10 MG/ML IJ SOLN
40.0000 mg | Freq: Once | INTRAMUSCULAR | Status: AC
  Administered 2021-04-13: 40 mg via INTRAVENOUS
  Filled 2021-04-13: qty 4

## 2021-04-13 MED ORDER — ACETAMINOPHEN 650 MG RE SUPP: 650.0000 mg | Freq: Four times a day (QID) | RECTAL | Status: DC | PRN

## 2021-04-13 MED ORDER — TIOTROPIUM BROMIDE MONOHYDRATE 2.5 MCG/ACT IN AERS: 1.0000 | INHALATION_SPRAY | Freq: Every day | RESPIRATORY_TRACT | Status: DC

## 2021-04-13 MED ORDER — UMECLIDINIUM BROMIDE 62.5 MCG/ACT IN AEPB
1.0000 | INHALATION_SPRAY | Freq: Every day | RESPIRATORY_TRACT | Status: DC
  Administered 2021-04-15 – 2021-04-17 (×3): 1 via RESPIRATORY_TRACT
  Filled 2021-04-13 (×2): qty 7

## 2021-04-13 MED ORDER — FUROSEMIDE 10 MG/ML IJ SOLN
40.0000 mg | Freq: Two times a day (BID) | INTRAMUSCULAR | Status: DC
  Administered 2021-04-14: 40 mg via INTRAVENOUS
  Filled 2021-04-13: qty 4

## 2021-04-13 MED ORDER — IOHEXOL 350 MG/ML SOLN
80.0000 mL | Freq: Once | INTRAVENOUS | Status: AC | PRN
  Administered 2021-04-13: 80 mL via INTRAVENOUS

## 2021-04-13 MED ORDER — ACETAMINOPHEN 325 MG PO TABS
650.0000 mg | ORAL_TABLET | Freq: Four times a day (QID) | ORAL | Status: DC | PRN
  Administered 2021-04-14: 650 mg via ORAL
  Filled 2021-04-13: qty 2

## 2021-04-13 MED ORDER — SODIUM CHLORIDE 0.9% FLUSH
3.0000 mL | Freq: Two times a day (BID) | INTRAVENOUS | Status: DC
  Administered 2021-04-13 – 2021-04-16 (×7): 3 mL via INTRAVENOUS

## 2021-04-13 NOTE — ED Notes (Addendum)
Denies CP.

## 2021-04-13 NOTE — H&P (Signed)
History and Physical   STONE SPIRITO PFX:[id number] DOB: 10/19/1950 DOA: 04/13/2021  PCP: Octavia Heir, NP   Patient coming from: Home  Chief Complaint: Shortness of breath, edema  HPI: Mitchell King is a 70 y.o. male with medical history significant of CHF with right heart failure in the setting of PE, hepatitis C, COPD, diabetes, hypertension, pulmonary embolism who presents with increasing shortness of breath and lower extremity edema.  As above patient reports about a week of increasing shortness of breath and edema.  He chronically uses 2 L home O2 and has had to increase this.  He reports some intermittent chest pain.  He also reports some intermittent black and tarry stools.  He was admitted in February for pulmonary embolism and was diagnosed with heart failure at that time and had some heart strain/right heart failure.  Was discharged on a diuretic but has not been taking this since April.  He denies fevers, chills, abdominal pain, constipation, diarrhea, nausea, vomiting.   ED Course: Vital signs in the ED significant for blood pressure in the 120s 130s systolic.  Respiratory rate in the 100s to 120s.  Lab work-up showed CMP with sodium 134, creatinine 1.31 which is near baseline of 1.1, protein 8.2.  CBC showed hemoglobin 8.3 which is down from previous of 12.  PT, PTT, INR pending.  Troponin similar to previous at 43 with repeat pending.  Lactic acid normal.  BNP elevated to 452.  Respiratory panel for flu and COVID pending.  FOBT was negative.  Chest x-ray showed cardiomegaly with basilar opacities likely edema.  Small right pleural effusion.  Patient received dose of IV Lasix in the ED.  Review of Systems: As per HPI otherwise all other systems reviewed and are negative.  Past Medical History:  Diagnosis Date   CHF (congestive heart failure) (HCC)    COPD (chronic obstructive pulmonary disease) (HCC)    Diabetes mellitus without complication (HCC)    Dyspnea    Hypertension      Past Surgical History:  Procedure Laterality Date   NO PAST SURGERIES      Social History  reports that he has quit smoking. His smoking use included cigarettes. He has a 60.00 pack-year smoking history. He has never used smokeless tobacco. He reports that he does not drink alcohol and does not use drugs.  No Known Allergies  Family History  Problem Relation Age of Onset   Heart attack Brother   Reviewed on admission  Prior to Admission medications   Medication Sig Start Date End Date Taking? Authorizing Provider  acetaminophen (TYLENOL) 500 MG tablet Take 1,000 mg by mouth every 8 (eight) hours as needed for mild pain or headache (or headaches).    [provider]  albuterol (VENTOLIN HFA) 108 (90 Base) MCG/ACT inhaler Inhale 2 puffs into the lungs every 4 (four) hours as needed for wheezing or shortness of breath. 09/04/20   Fargo, Amy E, NP  amLODipine (NORVASC) 5 MG tablet Take 1 tablet (5 mg total) by mouth daily. 10/17/20   Fargo, Amy E, NP  apixaban (ELIQUIS) 5 MG TABS tablet Take 1 tablet (5 mg total) by mouth 2 (two) times daily. 09/04/20   Fargo, Amy E, NP  ipratropium-albuterol (DUONEB) 0.5-2.5 (3) MG/3ML SOLN Take 3 mLs by nebulization every 4 (four) hours as needed. 09/04/20   Fargo, Amy E, NP  lactulose (CHRONULAC) 10 GM/15ML solution Take 30 mLs (20 g total) by mouth 2 (two) times daily. 09/12/20  Fargo, Amy E, NP  Tiotropium Bromide Monohydrate (SPIRIVA RESPIMAT) 2.5 MCG/ACT AERS Inhale into the lungs.    [provider]  Fluticasone-Salmeterol (ADVAIR DISKUS) 250-50 MCG/DOSE AEPB Inhale 1 puff into the lungs 2 (two) times daily. 11/20/11 10/11/13  Reuben Likes, MD    Physical Exam: Vitals:   04/13/21 1945 04/13/21 2000 04/13/21 2030 04/13/21 2045  BP: (!) 135/91 139/85 (!) 135/103 (!) 122/92  Pulse: (!) 129 (!) 129 (!) 125 (!) 129  Resp: 13 (!) 24 (!) 25 (!) 23  Temp:      TempSrc:      SpO2: 100% 100% 98% 96%   Physical Exam Constitutional:       General: He is not in acute distress.    Appearance: Normal appearance.  HENT:     Head: Normocephalic and atraumatic.     Mouth/Throat:     Mouth: Mucous membranes are moist.     Pharynx: Oropharynx is clear.  Eyes:     Extraocular Movements: Extraocular movements intact.     Pupils: Pupils are equal, round, and reactive to light.  Cardiovascular:     Rate and Rhythm: Regular rhythm. Tachycardia present.     Pulses: Normal pulses.     Heart sounds: Normal heart sounds.  Pulmonary:     Effort: Pulmonary effort is normal. No respiratory distress.     Breath sounds: Rales (Trace) present.  Abdominal:     General: Bowel sounds are normal. There is no distension.     Palpations: Abdomen is soft.     Tenderness: There is no abdominal tenderness.  Musculoskeletal:        General: No swelling or deformity.     Right lower leg: Edema present.     Left lower leg: Edema present.  Skin:    General: Skin is warm and dry.  Neurological:     General: No focal deficit present.     Mental Status: Mental status is at baseline.    Labs on Admission: I have personally reviewed following labs and imaging studies  CBC: Recent Labs  Lab 04/13/21 1805  WBC 4.4  NEUTROABS 2.7  HGB 8.3*  HCT 28.1*  MCV 88.6  PLT 307    Basic Metabolic Panel: Recent Labs  Lab 04/13/21 1805 04/13/21 1938  NA 134*  --   K 4.6  --   CL 98  --   CO2 30  --   GLUCOSE 85  --   BUN 22  --   CREATININE 1.31*  --   CALCIUM 8.9  --   MG  --  2.0    GFR: CrCl cannot be calculated (Unknown ideal weight.).  Liver Function Tests: Recent Labs  Lab 04/13/21 1805  AST 26  ALT 21  ALKPHOS 55  BILITOT 1.1  PROT 8.2*  ALBUMIN 3.7    Urine analysis:    Component Value Date/Time   COLORURINE YELLOW 08/07/2020 1101   APPEARANCEUR HAZY (A) 08/07/2020 1101   LABSPEC 1.011 08/07/2020 1101   PHURINE 5.0 08/07/2020 1101   GLUCOSEU NEGATIVE 08/07/2020 1101   HGBUR LARGE (A) 08/07/2020 1101    BILIRUBINUR NEGATIVE 08/07/2020 1101   BILIRUBINUR neg 04/20/2017 1447   KETONESUR NEGATIVE 08/07/2020 1101   PROTEINUR NEGATIVE 08/07/2020 1101   UROBILINOGEN 1.0 04/20/2017 1447   NITRITE NEGATIVE 08/07/2020 1101   LEUKOCYTESUR NEGATIVE 08/07/2020 1101    Radiological Exams on Admission: CT Angio Chest PE W and/or Wo Contrast  Result Date: 04/13/2021 CLINICAL DATA:  PE suspected, high prob.  Shortness of breath. EXAM: CT ANGIOGRAPHY CHEST WITH CONTRAST TECHNIQUE: Multidetector CT imaging of the chest was performed using the standard protocol during bolus administration of intravenous contrast. Multiplanar CT image reconstructions and MIPs were obtained to evaluate the vascular anatomy. CONTRAST:  81mL OMNIPAQUE IOHEXOL 350 MG/ML SOLN COMPARISON:  08/05/2020 FINDINGS: Cardiovascular: No filling defects in the pulmonary arteries to suggest pulmonary emboli. Cardiomegaly. Scattered coronary artery and aortic calcifications. No aneurysm. Mediastinum/Nodes: No mediastinal, hilar, or axillary adenopathy. Trachea and esophagus are unremarkable. Thyroid unremarkable. Lungs/Pleura: Mild to moderate centrilobular emphysema. Small right pleural effusion. Compressive atelectasis in the right lower lobe. No confluent opacities. Upper Abdomen: Imaging into the upper abdomen demonstrates no acute findings. Musculoskeletal: Chest wall soft tissues are unremarkable. No acute bony abnormality. Review of the MIP images confirms the above findings. IMPRESSION: No evidence of pulmonary embolus. Small right pleural effusion. Compressive atelectasis in the right lower lobe. Cardiomegaly, scattered coronary artery disease. Aortic Atherosclerosis (ICD10-I70.0) and Emphysema (ICD10-J43.9). Electronically Signed   By: Charlett Nose M.D.   On: 04/13/2021 21:02   DG Chest Port 1 View  Result Date: 04/13/2021 CLINICAL DATA:  Cough and shortness of breath, CHF. EXAM: PORTABLE CHEST 1 VIEW COMPARISON:  Chest x-ray 08/13/2020.  FINDINGS: The heart is enlarged, unchanged. There are interstitial opacities in both lower lungs with patchy airspace disease in the right lung base. There is a small right pleural effusion. The heart is enlarged. There are atherosclerotic calcifications of the aorta. There is no pneumothorax. No acute fractures are seen. IMPRESSION: 1. Cardiomegaly with bibasilar opacities, likely mild edema. Infection is not excluded in the right lung base. 2. Small right pleural effusion. Electronically Signed   By: Darliss Cheney M.D.   On: 04/13/2021 16:43    EKG: Independently reviewed.  Sinus or ectopic atrial tachycardia 120 bpm.  Some baseline wander.  Some nonspecific T wave changes in the anterior.  Leads.  Assessment/Plan Active Problems:   CHF exacerbation (HCC)  CHF exacerbation > Presenting with worsening shortness of breath and lower extremity edema.  Has increased home oxygen from 2 L to 6 L. > Was only recently diagnosed with CHF in the setting of PE in February.  At that time.  Have more right heart failure with EF of the left ventricle being 55-60%.  Has not had follow-up echo since that time. > Was initially discharged on diuretic but has not been taking this since April > CT in ED negative for recurrent pulmonary embolism -Monitor on telemetry -Continue with Lasix 40 mg IV twice daily - Strict ins/outs and daily weights - Fluid restricted diet - Check magnesium - Trend renal function and electrolytes - Echocardiogram  Anemia Suspected GI bleeding > Hemoglobin now 8.3 from baseline around 12.  Has been on chronic anticoagulation for prior pulmonary rhythm in February. > Repeat CTA in the ED showed no recurrent PE > He does report intermittent black and tarry stools.  Suspect he has had GI bleeding in the setting of being on blood thinner for this pulmonary embolism. > Reassuring that FOBT in ED was negative.  However his symptoms have been intermittent. - Going to hold anticoagulation  for now given CT findings and worsening anemia. - Trend CBC, will recheck once overnight to ensure not declining - Has been typed and screened in the ED.  Transfusion threshold would be around 8 due to possible CHF as above.  Tachycardia > Presented tachycardic in the 1 teens to 120s in  the ED.  Appears to be sinus versus ectopic atrial rhythm. - We will continue to monitor if converts to A. fib or flutter consider starting amiodarone. - Avoiding nodal blocking agents for now for possible worsening/new onset heart failure.  Hypertension - Continue home amlodipine, receiving diuretic treatment as above  History of pulmonary embolism > On Eliquis as above, which is being held as above. This was his first DVT/PE per his report and it has been >49mths, so he may no longer need this. > Repeat CT without evidence of PE - Continue hold Eliquis  COPD - Continue home Spiriva and as needed albuterol  History of hepatitis C - Monitor LFTs  DVT prophylaxis: SCDs  Code Status:   Full  Family Communication:  None on admission, he states he prefers to update his family. Disposition Plan:   Patient is from:  Home  Anticipated DC to:  Home  Anticipated DC date:  1 to 4 days  Anticipated DC barriers: None  Consults called:  None  Admission status:  Observation, telemetry   Severity of Illness: The appropriate patient status for this patient is OBSERVATION. Observation status is judged to be reasonable and necessary in order to provide the required intensity of service to ensure the patient's safety. The patient's presenting symptoms, physical exam findings, and initial radiographic and laboratory data in the context of their medical condition is felt to place them at decreased risk for further clinical deterioration. Furthermore, it is anticipated that the patient will be medically stable for discharge from the hospital within 2 midnights of admission.    Synetta Fail MD [facility]  How to contact the [facility] Attending or Consulting provider 7A - 7P or covering provider during after hours 7P -7A, for this patient?   Check the care team in [facility] and look for a) attending/consulting [facility] provider listed and b) the [facility] team listed Log into [email or web address] and use [place]'s universal password to access. If you do not have the password, please contact the hospital operator. Locate the [facility] provider you are looking for under [facility] and page to a number that you can be directly reached. If you still have difficulty reaching the provider, please page the [facility] (Director on Call) for the Hospitalists listed on amion for assistance.  04/13/2021, 9:58 PM

## 2021-04-13 NOTE — ED Triage Notes (Signed)
Pt reports worsening SHOB and leg swelling over the past few days. Pt is normally on 2L O2 at home. He is requiring 6L in triage with OZ bouncing from 90-94%. Endorses some intermittent chest pains.

## 2021-04-13 NOTE — ED Provider Notes (Signed)
[facility]-EMERGENCY DEPT Provider Note   CSN: [id number] Arrival date & time: 04/13/21  1509     History Chief Complaint  Patient presents with   Shortness of Breath   Leg Swelling    Mitchell King is a 70 y.o. male.   Shortness of Breath  This patient is a 70 year old male with a known history of congestive heart failure diabetes as well as hypertension.  The patient had been admitted to the hospital in February 2022, at this time he states that was the first time he had been told that he had congestive heart failure, ejection fraction at that time was 55 to 60%, there was some elevated pulmonary artery systolic pressure as well, there was mild concentric left ventricular hypertrophy, he also had an ultrasound of his legs which was unremarkable at that time.  The patient had bilateral pulmonary embolism with right heart failure at that time and had been placed on anticoagulation, currently taking Eliquis, currently taking amlodipine for blood pressure.  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  Torsemide was ultimately discontinued secondary to acute kidney injury.  He no longer takes any diuresis.  He went to a 1 month rehab stay and is currently living with his son in [place].  He is noticed over the last week that he has had some progressive shortness of breath but since Friday it has been particularly bad with severe dyspnea on exertion.  He denies fevers but states sometimes he feels hot, some coughing, significant and severe swelling of his legs.  He is not urinating that much.  Symptoms are severe, no diarrhea, no rectal bleeding  Past Medical History:  Diagnosis Date   CHF (congestive heart failure) (HCC)    COPD (chronic obstructive pulmonary disease) (HCC)    Diabetes mellitus without complication (HCC)    Dyspnea    Hypertension     Patient  Active Problem List   Diagnosis Date Noted   Malnutrition of moderate degree 08/14/2020   Hyperammonemia (HCC)    Acute respiratory failure (HCC)    Acute cor pulmonale (HCC) 08/06/2020   Pulmonary embolism (HCC) 08/06/2020   Right heart failure (HCC) 08/06/2020   Acute exacerbation of CHF (congestive heart failure) (HCC) 08/04/2020   Essential hypertension 08/04/2020   Diabetes mellitus (HCC) 08/04/2020   Nondependent alcohol abuse, continuous drinking behavior 08/04/2020   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 Authorizing Provider  acetaminophen (TYLENOL) 500 MG tablet Take 1,000 mg by mouth every 8 (eight) hours as needed for mild pain or headache (or headaches).    [provider]  albuterol (VENTOLIN HFA) 108 (90 Base)  MCG/ACT inhaler Inhale 2 puffs into the lungs every 4 (four) hours as needed for wheezing or shortness of breath. 09/04/20   Fargo, Amy E, NP  amLODipine (NORVASC) 5 MG tablet Take 1 tablet (5 mg total) by mouth daily. 10/17/20   Fargo, Amy E, NP  apixaban (ELIQUIS) 5 MG TABS tablet Take 1 tablet (5 mg total) by mouth 2 (two) times daily. 09/04/20   Fargo, Amy E, NP  ipratropium-albuterol (DUONEB) 0.5-2.5 (3) MG/3ML SOLN Take 3 mLs by nebulization every 4 (four) hours as needed. 09/04/20   Fargo, Amy E, NP  lactulose (CHRONULAC) 10 GM/15ML solution Take 30 mLs (20 g total) by  mouth 2 (two) times daily. 09/12/20   Fargo, Amy E, NP  Tiotropium Bromide Monohydrate (SPIRIVA RESPIMAT) 2.5 MCG/ACT AERS Inhale into the lungs.    [provider]  Fluticasone-Salmeterol (ADVAIR DISKUS) 250-50 MCG/DOSE AEPB Inhale 1 puff into the lungs 2 (two) times daily. 11/20/11 10/11/13  Reuben Likes, MD    Allergies    Patient has no known allergies.  Review of Systems   Review of Systems  Respiratory:  Positive for shortness of breath.   All other systems reviewed and are negative.  Physical Exam Updated Vital Signs BP 139/85   Pulse (!) 129   Temp 98.5 F (36.9 C) (Oral)   Resp (!) 24   SpO2 100%   Physical Exam Vitals and nursing note reviewed.  Constitutional:      General: He is in acute distress.     Appearance: He is well-developed. He is ill-appearing.  HENT:     Head: Normocephalic and atraumatic.     Mouth/Throat:     Pharynx: No oropharyngeal exudate.  Eyes:     General: No scleral icterus.       Right eye: No discharge.        Left eye: No discharge.     Conjunctiva/sclera: Conjunctivae normal.     Pupils: Pupils are equal, round, and reactive to light.  Neck:     Thyroid: No thyromegaly.     Vascular: No JVD.  Cardiovascular:     Rate and Rhythm: Regular rhythm. Tachycardia present.     Heart sounds: Normal heart sounds. No murmur heard.   No friction rub. No gallop.  Pulmonary:     Effort: No respiratory distress.     Breath sounds: Decreased breath sounds and rales present. No wheezing.     Comments: The patient is tachypnea, speaks in just shortened sentences and has significant decreased lung sounds in all lung fields with occasional rales Abdominal:     General: Bowel sounds are normal. There is no distension.     Palpations: Abdomen is soft. There is no mass.     Tenderness: There is no abdominal tenderness.  Musculoskeletal:        General: No tenderness. Normal range of motion.     Cervical back: Normal range of motion and neck  supple.     Right lower leg: Edema present.     Left lower leg: Edema present.  Lymphadenopathy:     Cervical: No cervical adenopathy.  Skin:    General: Skin is warm and dry.     Findings: No erythema or rash.  Neurological:     Mental Status: He is alert.     Coordination: Coordination normal.  Psychiatric:        Behavior: Behavior normal.    ED Results / Procedures / Treatments   Labs (all  labs ordered are listed, but only abnormal results are displayed) Labs Reviewed  BRAIN NATRIURETIC PEPTIDE - Abnormal; Notable for the following components:      Result Value   B Natriuretic Peptide 452.4 (*)    All other components within normal limits  COMPREHENSIVE METABOLIC PANEL - Abnormal; Notable for the following components:   Sodium 134 (*)    Creatinine, Ser 1.31 (*)    Total Protein 8.2 (*)    GFR, Estimated 59 (*)    All other components within normal limits  CBC WITH DIFFERENTIAL/PLATELET - Abnormal; Notable for the following components:   RBC 3.17 (*)    Hemoglobin 8.3 (*)    HCT 28.1 (*)    MCHC 29.5 (*)    nRBC 2.5 (*)    All other components within normal limits  TROPONIN I (HIGH SENSITIVITY) - Abnormal; Notable for the following components:   Troponin I (High Sensitivity) 43 (*)    All other components within normal limits  RESP PANEL BY RT-PCR (FLU A&B, COVID) ARPGX2  CULTURE, BLOOD (ROUTINE X 2)  CULTURE, BLOOD (ROUTINE X 2)  LACTIC ACID, PLASMA  PROTIME-INR  APTT  TYPE AND SCREEN  TROPONIN I (HIGH SENSITIVITY)    EKG EKG Interpretation  Date/Time:  Monday April 13 2021 19:44:45 EDT Ventricular Rate:  128 PR Interval:  206 QRS Duration: 116 QT Interval:  322 QTC Calculation: 470 R Axis:   -56 Text Interpretation: Sinus or ectopic atrial tachycardia Borderline prolonged PR interval LAD, consider left anterior fascicular block Nonspecific T abnormalities, anterior leads Baseline wander in lead(s) V2 Confirmed by Eber Hong ([id number]) on 04/13/2021  7:58:39 PM  Radiology DG Chest Port 1 View  Result Date: 04/13/2021 CLINICAL DATA:  Cough and shortness of breath, CHF. EXAM: PORTABLE CHEST 1 VIEW COMPARISON:  Chest x-ray 08/13/2020. FINDINGS: The heart is enlarged, unchanged. There are interstitial opacities in both lower lungs with patchy airspace disease in the right lung base. There is a small right pleural effusion. The heart is enlarged. There are atherosclerotic calcifications of the aorta. There is no pneumothorax. No acute fractures are seen. IMPRESSION: 1. Cardiomegaly with bibasilar opacities, likely mild edema. Infection is not excluded in the right lung base. 2. Small right pleural effusion. Electronically Signed   By: Darliss Cheney M.D.   On: 04/13/2021 16:43    Procedures .Critical Care Performed by: Eber Hong, MD Authorized by: Eber Hong, MD   Critical care provider statement:    Critical care time (minutes):  45   Critical care time was exclusive of:  Separately billable procedures and treating other patients   Critical care was necessary to treat or prevent imminent or life-threatening deterioration of the following conditions:  Respiratory failure and cardiac failure   Critical care was time spent personally by me on the following activities:  Development of treatment plan with patient or surrogate, discussions with consultants, evaluation of patient's response to treatment, examination of patient, obtaining history from patient or surrogate, review of old charts, re-evaluation of patient's condition, pulse oximetry, ordering and review of radiographic studies, ordering and review of laboratory studies and ordering and performing treatments and interventions   Care discussed with: admitting provider     Medications Ordered in ED Medications  iohexol (OMNIPAQUE) 350 MG/ML injection 80 mL (has no administration in time range)  furosemide (LASIX) injection 40 mg (40 mg Intravenous Given 04/13/21 1757)    ED Course   I have reviewed the triage vital signs and the nursing  notes.  Pertinent labs & imaging results that were available during my care of the patient were reviewed by me and considered in my medical decision making (see chart for details).    MDM Rules/Calculators/A&P                           This patient presents to the ED for concern of shortness of breath which is likely driven by multiple different possibilities, this involves an extensive number of treatment options, and is a complaint that carries with it a high risk of complications and morbidity.  The differential diagnosis includes congestive heart failure, worsening pulmonary embolism though less likely given treatment with Eliquis, acute on chronic congestive heart failure with pulmonary edema, pneumonia, sepsis, Acute coronary syndrome   Lab Tests:  I Ordered, reviewed, and interpreted labs, which included CBC, metabolic panel, troponin, BNP, lactic acid  Medicines ordered:  I ordered medication Lasix for edema  Imaging Studies ordered:  I ordered imaging studies which included chest x-ray and I independently visualized and interpreted imaging which showed no significant cardiac enlargement though it did tend to be a little bit large, there was no significant pulmonary infiltrates or edema and no pneumothorax or mediastinal abnormalities of concern  Additional history obtained:  Additional history obtained from electronic medical record Previous records obtained and reviewed extensive records reviewed including ICU stay and multiple visits afterwards including cardiology  Consultations Obtained:  I consulted Hospitalist  and discussed lab and imaging findings - they will come to admit.  Reevaluation:  After the interventions stated above, I reevaluated the patient and found to have ongoing tachycardia in fact his heart rate was around 125 to 130 bpm, he has diuresed approximately 2 L urinating, he is still on oxygen and  tachypneic and unfortunately had a hemoglobin that came back about 4 g lower than it did several months ago.  He reports having intermittent black tarry stools but they have not been that way recently and on my exam after the patient gave me and consent I did a digital rectal exam, he had only some mucoid stool in the vault with a small speck of brown stool all of which was Hemoccult negative.  Given the relatively normal x-ray and the constant tachycardia and shortness of breath a CT angiogram has been ordered as well.  Will add type and screen  Critical Interventions:  CT angiogram Hospitalist consultation Diuresis Type and screen   Final Clinical Impression(s) / ED Diagnoses Final diagnoses:  Acute congestive heart failure, unspecified heart failure type (HCC)  Anemia, unspecified type     Eber Hong, MD 04/13/21 2017

## 2021-04-13 NOTE — ED Notes (Signed)
Trop 43 reported to Dr. Hyacinth Meeker. EKG repeated, denies CP, O2 titrated to 4L (2L at baseline) from 6L, 96% at this time

## 2021-04-14 ENCOUNTER — Observation Stay (HOSPITAL_COMMUNITY): Payer: Medicare Other

## 2021-04-14 DIAGNOSIS — I509 Heart failure, unspecified: Secondary | ICD-10-CM | POA: Diagnosis not present

## 2021-04-14 DIAGNOSIS — R0609 Other forms of dyspnea: Secondary | ICD-10-CM | POA: Diagnosis not present

## 2021-04-14 DIAGNOSIS — J438 Other emphysema: Secondary | ICD-10-CM | POA: Diagnosis not present

## 2021-04-14 DIAGNOSIS — D649 Anemia, unspecified: Secondary | ICD-10-CM | POA: Diagnosis not present

## 2021-04-14 DIAGNOSIS — I50813 Acute on chronic right heart failure: Secondary | ICD-10-CM | POA: Diagnosis not present

## 2021-04-14 DIAGNOSIS — Z86711 Personal history of pulmonary embolism: Secondary | ICD-10-CM | POA: Diagnosis not present

## 2021-04-14 DIAGNOSIS — I10 Essential (primary) hypertension: Secondary | ICD-10-CM | POA: Diagnosis not present

## 2021-04-14 DIAGNOSIS — J449 Chronic obstructive pulmonary disease, unspecified: Secondary | ICD-10-CM | POA: Diagnosis not present

## 2021-04-14 LAB — CBC
HCT: 28 % — ABNORMAL LOW (ref 39.0–52.0)
Hemoglobin: 8.3 g/dL — ABNORMAL LOW (ref 13.0–17.0)
MCH: 26.3 pg (ref 26.0–34.0)
MCHC: 29.6 g/dL — ABNORMAL LOW (ref 30.0–36.0)
MCV: 88.6 fL (ref 80.0–100.0)
Platelets: 308 10*3/uL (ref 150–400)
RBC: 3.16 MIL/uL — ABNORMAL LOW (ref 4.22–5.81)
RDW: 15.7 % — ABNORMAL HIGH (ref 11.5–15.5)
WBC: 5.1 10*3/uL (ref 4.0–10.5)
nRBC: 2.2 % — ABNORMAL HIGH (ref 0.0–0.2)

## 2021-04-14 LAB — COMPREHENSIVE METABOLIC PANEL
ALT: 22 U/L (ref 0–44)
AST: 30 U/L (ref 15–41)
Albumin: 3.7 g/dL (ref 3.5–5.0)
Alkaline Phosphatase: 56 U/L (ref 38–126)
Anion gap: 7 (ref 5–15)
BUN: 25 mg/dL — ABNORMAL HIGH (ref 8–23)
CO2: 31 mmol/L (ref 22–32)
Calcium: 9.1 mg/dL (ref 8.9–10.3)
Chloride: 97 mmol/L — ABNORMAL LOW (ref 98–111)
Creatinine, Ser: 1.41 mg/dL — ABNORMAL HIGH (ref 0.61–1.24)
GFR, Estimated: 54 mL/min — ABNORMAL LOW (ref >60)
Glucose, Bld: 86 mg/dL (ref 70–99)
Potassium: 4.1 mmol/L (ref 3.5–5.1)
Sodium: 135 mmol/L (ref 135–145)
Total Bilirubin: 1 mg/dL (ref 0.3–1.2)
Total Protein: 8.1 g/dL (ref 6.5–8.1)

## 2021-04-14 LAB — ECHOCARDIOGRAM COMPLETE
Area-P 1/2: 6.43 cm2
Height: 73 in
S' Lateral: 3 cm
Weight: 3604.96 oz

## 2021-04-14 MED ORDER — FUROSEMIDE 10 MG/ML IJ SOLN
60.0000 mg | Freq: Two times a day (BID) | INTRAMUSCULAR | Status: DC
  Administered 2021-04-14 – 2021-04-16 (×4): 60 mg via INTRAVENOUS
  Filled 2021-04-14 (×4): qty 6

## 2021-04-14 MED ORDER — ORAL CARE MOUTH RINSE
15.0000 mL | Freq: Two times a day (BID) | OROMUCOSAL | Status: DC
  Administered 2021-04-14 – 2021-04-16 (×6): 15 mL via OROMUCOSAL

## 2021-04-14 MED ORDER — METOPROLOL TARTRATE 25 MG PO TABS
25.0000 mg | ORAL_TABLET | Freq: Two times a day (BID) | ORAL | Status: DC
  Administered 2021-04-14 – 2021-04-17 (×7): 25 mg via ORAL
  Filled 2021-04-14 (×7): qty 1

## 2021-04-14 NOTE — Progress Notes (Signed)
PROGRESS NOTE  Mitchell King    DOB: 1950-12-05, 70 y.o.  ELF:[id number]  PCP: Octavia Heir, NP   Code Status: Full Code   DOA: 04/13/2021   LOS: 0  Brief Narrative of Current Hospitalization  Mitchell King is a 70 y.o. male with a PMH significant for CHF, hepatitis C, COPD, diabetes, HTN, history of PE.  On 2 L O2 at baseline. They presented from home to the ED on 04/13/2021 with shortness of breath and edema x 1 week.  In the ED, it was found that they had CHF exacerbation. They were treated with diuresis.  Patient was admitted to medicine service for further workup and management of heart failure as outlined in detail below.  04/14/21 -stable, improved  Assessment & Plan  Principal Problem:   CHF exacerbation (HCC) Active Problems:   Chronic obstructive pulmonary disease (HCC)   Essential hypertension   Diabetes mellitus (HCC)   History of pulmonary embolus (PE)   Right heart failure (HCC)   Anemia  CHF exacerbation-clinically improved volume status with decreased lower extremity swelling and improvement in respiratory status - echo pending - continue diuresis at increased dose given endorsing decreased response -Strict I's/O -Daily weights, evaluate for dry weight to help decide on home diuretic therapy -OOB as tolerated  COPD on 2 L oxygen at baseline.  2.5 L currently today and denies respiratory concerns. -Add humidifier to oxygen  Anemia-hemoglobin stable at 8.3 since admission.  Anticoagulation was held on admission for suspected GI bleed. -CBC a.m. -Assess for anemia panel and colonoscopy history  HTN  tachycardia-chronic, stable and normotensive this admission asymptomatic heart rate up to 130 sinus rhythm -Continue home amlodipine -Likely medication modification after echo evaluation  History of PE -Home medications including Eliquis is being held and can likely be discontinued  History of hepatitis C-unknown treatment history.  Transaminases normal on  admission. -Hepatitis panel a.m. -CMP a.m.  DVT prophylaxis: SCDs Start: 04/13/21 2100   Diet:  Diet Orders (From admission, onward)     Start     Ordered   04/13/21 2100  Diet heart healthy/carb modified Room service appropriate? Yes; Fluid consistency: Thin; Fluid restriction: 1800 mL Fluid  Diet effective now       Question Answer Comment  Diet-HS Snack? Nothing   Room service appropriate? Yes   Fluid consistency: Thin   Fluid restriction: 1800 mL Fluid      04/13/21 2103            Subjective 04/14/21    Pt reports asymptomatic,.  Endorses good urinary output from diuretics but has been diminished since first dose.  Disposition Plan & Communication  Status is: Observation  The patient will require care spanning > 2 midnights and should be moved to inpatient because: Ongoing diuresis    Inpatient Home with home health  Family Communication: Son on phone Consults, Procedures, Significant Events  Consultants:  None  Procedures/significant events:  Echo  Antimicrobials:  Anti-infectives (From admission, onward)    None        Objective   Vitals:   04/13/21 2259 04/14/21 0200 04/14/21 0239 04/14/21 0514  BP: (!) 145/92  (!) 126/91 138/90  Pulse: (!) 128  (!) 132 (!) 118  Resp: 20 20 20 18   Temp: 98.8 F (37.1 C)  99.2 F (37.3 C) 98.6 F (37 C)  TempSrc: Oral  Oral Oral  SpO2: 100%  99% 95%  Weight: 102.2 kg     Height: 6\' 1"  (  1.854 m)       Intake/Output Summary (Last 24 hours) at 04/14/2021 0751 Last data filed at 04/14/2021 0522 Gross per 24 hour  Intake 340 ml  Output 2275 ml  Net -1935 ml   Filed Weights   04/13/21 2259  Weight: 102.2 kg    Patient BMI: Body mass index is 29.73 kg/m.   Physical Exam: General: awake, alert, NAD HEENT: atraumatic, clear conjunctiva, anicteric sclera, moist mucus membranes, hearing grossly normal Respiratory: CTAB, no wheezes, rales or rhonchi, normal respiratory effort. Cardiovascular: normal  S1/S2,  RRR, no JVD, murmurs, rubs, gallops, quick capillary refill  Gastrointestinal: soft, NT, ND, no HSM felt Nervous: A&O x3. no gross focal neurologic deficits, normal speech Extremities: moves all equally, 2+ pitting lower extremity edema, normal tone Skin: dry, intact, normal temperature, normal color, No rashes, lesions or ulcers Psychiatry: normal mood, congruent affect  Labs   I have personally reviewed following labs and imaging studies Admission on 04/13/2021  Component Date Value Ref Range Status   B Natriuretic Peptide 04/13/2021 452.4 (A)  0.0 - 100.0 pg/mL Final   Sodium 04/13/2021 134 (A)  135 - 145 mmol/L Final   Potassium 04/13/2021 4.6  3.5 - 5.1 mmol/L Final   Chloride 04/13/2021 98  98 - 111 mmol/L Final   CO2 04/13/2021 30  22 - 32 mmol/L Final   Glucose, Bld 04/13/2021 85  70 - 99 mg/dL Final   BUN 40/98/1191 22  8 - 23 mg/dL Final   Creatinine, Ser 04/13/2021 1.31 (A)  0.61 - 1.24 mg/dL Final   Calcium 47/82/9562 8.9  8.9 - 10.3 mg/dL Final   Total Protein 13/01/6577 8.2 (A)  6.5 - 8.1 g/dL Final   Albumin 46/96/2952 3.7  3.5 - 5.0 g/dL Final   AST 84/13/2440 26  15 - 41 U/L Final   ALT 04/13/2021 21  0 - 44 U/L Final   Alkaline Phosphatase 04/13/2021 55  38 - 126 U/L Final   Total Bilirubin 04/13/2021 1.1  0.3 - 1.2 mg/dL Final   GFR, Estimated 04/13/2021 59 (A)  >60 mL/min Final   Anion gap 04/13/2021 6  5 - 15 Final   Troponin I (High Sensitivity) 04/13/2021 43 (A)  <18 ng/L Final   WBC 04/13/2021 4.4  4.0 - 10.5 K/uL Final   RBC 04/13/2021 3.17 (A)  4.22 - 5.81 MIL/uL Final   Hemoglobin 04/13/2021 8.3 (A)  13.0 - 17.0 g/dL Final   HCT 04/17/2535 28.1 (A)  39.0 - 52.0 % Final   MCV 04/13/2021 88.6  80.0 - 100.0 fL Final   MCH 04/13/2021 26.2  26.0 - 34.0 pg Final   MCHC 04/13/2021 29.5 (A)  30.0 - 36.0 g/dL Final   RDW 64/40/3474 15.4  11.5 - 15.5 % Final   Platelets 04/13/2021 307  150 - 400 K/uL Final   nRBC 04/13/2021 2.5 (A)  0.0 - 0.2 % Final    Neutrophils Relative % 04/13/2021 59  % Final   Neutro Abs 04/13/2021 2.7  1.7 - 7.7 K/uL Final   Lymphocytes Relative 04/13/2021 20  % Final   Lymphs Abs 04/13/2021 0.9  0.7 - 4.0 K/uL Final   Monocytes Relative 04/13/2021 17  % Final   Monocytes Absolute 04/13/2021 0.7  0.1 - 1.0 K/uL Final   Eosinophils Relative 04/13/2021 2  % Final   Eosinophils Absolute 04/13/2021 0.1  0.0 - 0.5 K/uL Final   Basophils Relative 04/13/2021 1  % Final   Basophils Absolute 04/13/2021  0.0  0.0 - 0.1 K/uL Final   Immature Granulocytes 04/13/2021 1  % Final   Abs Immature Granulocytes 04/13/2021 0.02  0.00 - 0.07 K/uL Final   Lactic Acid, Venous 04/13/2021 1.2  0.5 - 1.9 mmol/L Final   SARS Coronavirus 2 by RT PCR 04/13/2021 NEGATIVE  NEGATIVE Final   Influenza A by PCR 04/13/2021 NEGATIVE  NEGATIVE Final   Influenza B by PCR 04/13/2021 NEGATIVE  NEGATIVE Final   Troponin I (High Sensitivity) 04/13/2021 45 (A)  <18 ng/L Final   ABO/RH(D) 04/13/2021 O POS   Final   Antibody Screen 04/13/2021 NEG   Final   Sample Expiration 04/13/2021    Final                   Value:04/16/2021,2359 Performed at [facility], [address] [place], [place] [address]    Prothrombin Time 04/13/2021 18.4 (A)  11.4 - 15.2 seconds Final   INR 04/13/2021 1.5 (A)  0.8 - 1.2 Final   aPTT 04/13/2021 52 (A)  24 - 36 seconds Final   Magnesium 04/13/2021 2.0  1.7 - 2.4 mg/dL Final   WBC 25/36/6440 4.4  4.0 - 10.5 K/uL Final   RBC 04/13/2021 3.20 (A)  4.22 - 5.81 MIL/uL Final   Hemoglobin 04/13/2021 8.3 (A)  13.0 - 17.0 g/dL Final   HCT 34/74/2595 28.2 (A)  39.0 - 52.0 % Final   MCV 04/13/2021 88.1  80.0 - 100.0 fL Final   MCH 04/13/2021 25.9 (A)  26.0 - 34.0 pg Final   MCHC 04/13/2021 29.4 (A)  30.0 - 36.0 g/dL Final   RDW 63/87/5643 15.6 (A)  11.5 - 15.5 % Final   Platelets 04/13/2021 306  150 - 400 K/uL Final   nRBC 04/13/2021 2.0 (A)  0.0 - 0.2 % Final   Sodium 04/14/2021 135  135 - 145 mmol/L Final    Potassium 04/14/2021 4.1  3.5 - 5.1 mmol/L Final   Chloride 04/14/2021 97 (A)  98 - 111 mmol/L Final   CO2 04/14/2021 31  22 - 32 mmol/L Final   Glucose, Bld 04/14/2021 86  70 - 99 mg/dL Final   BUN 32/95/1884 25 (A)  8 - 23 mg/dL Final   Creatinine, Ser 04/14/2021 1.41 (A)  0.61 - 1.24 mg/dL Final   Calcium 16/60/6301 9.1  8.9 - 10.3 mg/dL Final   Total Protein 60/03/9322 8.1  6.5 - 8.1 g/dL Final   Albumin 55/73/2202 3.7  3.5 - 5.0 g/dL Final   AST 54/27/0623 30  15 - 41 U/L Final   ALT 04/14/2021 22  0 - 44 U/L Final   Alkaline Phosphatase 04/14/2021 56  38 - 126 U/L Final   Total Bilirubin 04/14/2021 1.0  0.3 - 1.2 mg/dL Final   GFR, Estimated 04/14/2021 54 (A)  >60 mL/min Final   Anion gap 04/14/2021 7  5 - 15 Final   WBC 04/14/2021 5.1  4.0 - 10.5 K/uL Final   RBC 04/14/2021 3.16 (A)  4.22 - 5.81 MIL/uL Final   Hemoglobin 04/14/2021 8.3 (A)  13.0 - 17.0 g/dL Final   HCT 76/28/3151 28.0 (A)  39.0 - 52.0 % Final   MCV 04/14/2021 88.6  80.0 - 100.0 fL Final   MCH 04/14/2021 26.3  26.0 - 34.0 pg Final   MCHC 04/14/2021 29.6 (A)  30.0 - 36.0 g/dL Final   RDW 76/16/0737 15.7 (A)  11.5 - 15.5 % Final   Platelets 04/14/2021 308  150 -  400 K/uL Final   nRBC 04/14/2021 2.2 (A)  0.0 - 0.2 % Final   ABO/RH(D) 04/13/2021    Final                   Value:O POS Performed at [facility], [address], [place], [place] [address]     Imaging Studies  CT Angio Chest PE W and/or Wo Contrast  Result Date: 04/13/2021 CLINICAL DATA:  PE suspected, high prob.  Shortness of breath. EXAM: CT ANGIOGRAPHY CHEST WITH CONTRAST TECHNIQUE: Multidetector CT imaging of the chest was performed using the standard protocol during bolus administration of intravenous contrast. Multiplanar CT image reconstructions and MIPs were obtained to evaluate the vascular anatomy. CONTRAST:  83mL OMNIPAQUE IOHEXOL 350 MG/ML SOLN COMPARISON:  08/05/2020 FINDINGS: Cardiovascular: No filling defects in the  pulmonary arteries to suggest pulmonary emboli. Cardiomegaly. Scattered coronary artery and aortic calcifications. No aneurysm. Mediastinum/Nodes: No mediastinal, hilar, or axillary adenopathy. Trachea and esophagus are unremarkable. Thyroid unremarkable. Lungs/Pleura: Mild to moderate centrilobular emphysema. Small right pleural effusion. Compressive atelectasis in the right lower lobe. No confluent opacities. Upper Abdomen: Imaging into the upper abdomen demonstrates no acute findings. Musculoskeletal: Chest wall soft tissues are unremarkable. No acute bony abnormality. Review of the MIP images confirms the above findings. IMPRESSION: No evidence of pulmonary embolus. Small right pleural effusion. Compressive atelectasis in the right lower lobe. Cardiomegaly, scattered coronary artery disease. Aortic Atherosclerosis (ICD10-I70.0) and Emphysema (ICD10-J43.9). Electronically Signed   By: Charlett Nose M.D.   On: 04/13/2021 21:02   DG Chest Port 1 View  Result Date: 04/13/2021 CLINICAL DATA:  Cough and shortness of breath, CHF. EXAM: PORTABLE CHEST 1 VIEW COMPARISON:  Chest x-ray 08/13/2020. FINDINGS: The heart is enlarged, unchanged. There are interstitial opacities in both lower lungs with patchy airspace disease in the right lung base. There is a small right pleural effusion. The heart is enlarged. There are atherosclerotic calcifications of the aorta. There is no pneumothorax. No acute fractures are seen. IMPRESSION: 1. Cardiomegaly with bibasilar opacities, likely mild edema. Infection is not excluded in the right lung base. 2. Small right pleural effusion. Electronically Signed   By: Darliss Cheney M.D.   On: 04/13/2021 16:43   Medications   Scheduled Meds:  furosemide  40 mg Intravenous Q12H   mouth rinse  15 mL Mouth Rinse BID   sodium chloride flush  3 mL Intravenous Q12H   umeclidinium bromide  1 puff Inhalation Daily   No recently discontinued medications to reconcile  LOS: 0 days   Time  spent: >22min  Leeroy Bock, DO [facility] 04/14/2021, 7:51 AM    Please refer to amion to contact the [facility] Attending or Consulting provider for this pt  [email or web address] Available by Epic secure chat 7AM-7PM. If 7PM-7AM, please contact night-coverage

## 2021-04-14 NOTE — Progress Notes (Signed)
   04/14/21 0239  Assess: MEWS Score  Temp 99.2 F (37.3 C)  BP (!) 126/91  Pulse Rate (!) 132  Resp 20  SpO2 99 %  O2 Device Nasal Cannula  O2 Flow Rate (L/min) 2 L/min  Assess: MEWS Score  MEWS Temp 0  MEWS Systolic 0  MEWS Pulse 3  MEWS RR 0  MEWS LOC 0  MEWS Score 3  MEWS Score Color Yellow  Treat  Pain Scale 0-10  Pain Score 0  Notify: Provider  Provider Name/Title J. Garner Nash  Date Provider Notified 04/14/21  Time Provider Notified [phone]  Notification Type Page (secure chat)  Notification Reason Other (Comment) [phone] sustaining)  Provider response No new orders  Date of Provider Response 04/14/21  Time of Provider Response 0242  Assess: SIRS CRITERIA  SIRS Temperature  0  SIRS Pulse 1  SIRS Respirations  0  SIRS WBC 0  SIRS Score Sum  1  Pt alert, no c/o pain, no s/s of respiratory distress, pt comfortably in bed watching tv. Will continue plan of care.

## 2021-04-14 NOTE — Progress Notes (Signed)
Patient's heart rate rate still in the 130s, Dr. Dareen Piano aware and stated will re-evaluate after ECHO. Patient denies any pain/distress, will continue to monitor patient.

## 2021-04-14 NOTE — Progress Notes (Signed)
  Echocardiogram 2D Echocardiogram has been performed.  Pieter Partridge 04/14/2021, 12:14 PM

## 2021-04-15 DIAGNOSIS — D509 Iron deficiency anemia, unspecified: Secondary | ICD-10-CM | POA: Diagnosis not present

## 2021-04-15 DIAGNOSIS — D508 Other iron deficiency anemias: Secondary | ICD-10-CM

## 2021-04-15 DIAGNOSIS — D6832 Hemorrhagic disorder due to extrinsic circulating anticoagulants: Secondary | ICD-10-CM | POA: Diagnosis present

## 2021-04-15 DIAGNOSIS — I50811 Acute right heart failure: Secondary | ICD-10-CM

## 2021-04-15 DIAGNOSIS — R0602 Shortness of breath: Secondary | ICD-10-CM | POA: Diagnosis present

## 2021-04-15 DIAGNOSIS — Z7901 Long term (current) use of anticoagulants: Secondary | ICD-10-CM | POA: Diagnosis not present

## 2021-04-15 DIAGNOSIS — E119 Type 2 diabetes mellitus without complications: Secondary | ICD-10-CM | POA: Diagnosis not present

## 2021-04-15 DIAGNOSIS — I509 Heart failure, unspecified: Secondary | ICD-10-CM | POA: Diagnosis not present

## 2021-04-15 DIAGNOSIS — Z79899 Other long term (current) drug therapy: Secondary | ICD-10-CM | POA: Diagnosis not present

## 2021-04-15 DIAGNOSIS — Z7951 Long term (current) use of inhaled steroids: Secondary | ICD-10-CM | POA: Diagnosis not present

## 2021-04-15 DIAGNOSIS — J9621 Acute and chronic respiratory failure with hypoxia: Secondary | ICD-10-CM | POA: Diagnosis present

## 2021-04-15 DIAGNOSIS — I5023 Acute on chronic systolic (congestive) heart failure: Secondary | ICD-10-CM | POA: Diagnosis present

## 2021-04-15 DIAGNOSIS — Z87891 Personal history of nicotine dependence: Secondary | ICD-10-CM | POA: Diagnosis not present

## 2021-04-15 DIAGNOSIS — Z9981 Dependence on supplemental oxygen: Secondary | ICD-10-CM | POA: Diagnosis not present

## 2021-04-15 DIAGNOSIS — I272 Pulmonary hypertension, unspecified: Secondary | ICD-10-CM | POA: Diagnosis not present

## 2021-04-15 DIAGNOSIS — D649 Anemia, unspecified: Secondary | ICD-10-CM | POA: Diagnosis not present

## 2021-04-15 DIAGNOSIS — T45515A Adverse effect of anticoagulants, initial encounter: Secondary | ICD-10-CM | POA: Diagnosis present

## 2021-04-15 DIAGNOSIS — I10 Essential (primary) hypertension: Secondary | ICD-10-CM | POA: Diagnosis not present

## 2021-04-15 DIAGNOSIS — Z20822 Contact with and (suspected) exposure to covid-19: Secondary | ICD-10-CM | POA: Diagnosis present

## 2021-04-15 DIAGNOSIS — Z8249 Family history of ischemic heart disease and other diseases of the circulatory system: Secondary | ICD-10-CM | POA: Diagnosis not present

## 2021-04-15 DIAGNOSIS — J449 Chronic obstructive pulmonary disease, unspecified: Secondary | ICD-10-CM | POA: Diagnosis not present

## 2021-04-15 DIAGNOSIS — Z86711 Personal history of pulmonary embolism: Secondary | ICD-10-CM | POA: Diagnosis not present

## 2021-04-15 DIAGNOSIS — N179 Acute kidney failure, unspecified: Secondary | ICD-10-CM | POA: Diagnosis not present

## 2021-04-15 DIAGNOSIS — I50813 Acute on chronic right heart failure: Secondary | ICD-10-CM | POA: Diagnosis not present

## 2021-04-15 DIAGNOSIS — J438 Other emphysema: Secondary | ICD-10-CM | POA: Diagnosis not present

## 2021-04-15 DIAGNOSIS — Z8619 Personal history of other infectious and parasitic diseases: Secondary | ICD-10-CM | POA: Diagnosis not present

## 2021-04-15 DIAGNOSIS — I110 Hypertensive heart disease with heart failure: Secondary | ICD-10-CM | POA: Diagnosis not present

## 2021-04-15 LAB — COMPREHENSIVE METABOLIC PANEL
ALT: 21 U/L (ref 0–44)
AST: 35 U/L (ref 15–41)
Albumin: 3.8 g/dL (ref 3.5–5.0)
Alkaline Phosphatase: 61 U/L (ref 38–126)
Anion gap: 7 (ref 5–15)
BUN: 29 mg/dL — ABNORMAL HIGH (ref 8–23)
CO2: 37 mmol/L — ABNORMAL HIGH (ref 22–32)
Calcium: 9 mg/dL (ref 8.9–10.3)
Chloride: 89 mmol/L — ABNORMAL LOW (ref 98–111)
Creatinine, Ser: 1.38 mg/dL — ABNORMAL HIGH (ref 0.61–1.24)
GFR, Estimated: 55 mL/min — ABNORMAL LOW (ref >60)
Glucose, Bld: 99 mg/dL (ref 70–99)
Potassium: 3.6 mmol/L (ref 3.5–5.1)
Sodium: 133 mmol/L — ABNORMAL LOW (ref 135–145)
Total Bilirubin: 0.9 mg/dL (ref 0.3–1.2)
Total Protein: 8.5 g/dL — ABNORMAL HIGH (ref 6.5–8.1)

## 2021-04-15 LAB — CBC
HCT: 29.7 % — ABNORMAL LOW (ref 39.0–52.0)
Hemoglobin: 8.8 g/dL — ABNORMAL LOW (ref 13.0–17.0)
MCH: 26 pg (ref 26.0–34.0)
MCHC: 29.6 g/dL — ABNORMAL LOW (ref 30.0–36.0)
MCV: 87.6 fL (ref 80.0–100.0)
Platelets: 341 10*3/uL (ref 150–400)
RBC: 3.39 MIL/uL — ABNORMAL LOW (ref 4.22–5.81)
RDW: 15.8 % — ABNORMAL HIGH (ref 11.5–15.5)
WBC: 4.4 10*3/uL (ref 4.0–10.5)
nRBC: 1.4 % — ABNORMAL HIGH (ref 0.0–0.2)

## 2021-04-15 LAB — IRON AND TIBC
Iron: 15 ug/dL — ABNORMAL LOW (ref 45–182)
Saturation Ratios: 3 % — ABNORMAL LOW (ref 17.9–39.5)
TIBC: 482 ug/dL — ABNORMAL HIGH (ref 250–450)
UIBC: 467 ug/dL

## 2021-04-15 LAB — RETICULOCYTES
Immature Retic Fract: 25.1 % — ABNORMAL HIGH (ref 2.3–15.9)
RBC.: 3.45 MIL/uL — ABNORMAL LOW (ref 4.22–5.81)
Retic Count, Absolute: 67.3 10*3/uL (ref 19.0–186.0)
Retic Ct Pct: 2 % (ref 0.4–3.1)

## 2021-04-15 LAB — HEPATITIS PANEL, ACUTE
HCV Ab: REACTIVE — AB
Hep A IgM: NONREACTIVE
Hep B C IgM: NONREACTIVE
Hepatitis B Surface Ag: NONREACTIVE

## 2021-04-15 LAB — VITAMIN B12: Vitamin B-12: 507 pg/mL (ref 180–914)

## 2021-04-15 LAB — FERRITIN: Ferritin: 11 ng/mL — ABNORMAL LOW (ref 24–336)

## 2021-04-15 LAB — FOLATE: Folate: 7.3 ng/mL (ref >5.9)

## 2021-04-15 MED ORDER — SALINE SPRAY 0.65 % NA SOLN
1.0000 | NASAL | Status: DC | PRN
  Filled 2021-04-15: qty 44

## 2021-04-15 MED ORDER — ENOXAPARIN SODIUM 40 MG/0.4ML IJ SOSY
40.0000 mg | PREFILLED_SYRINGE | INTRAMUSCULAR | Status: DC
  Administered 2021-04-15 – 2021-04-16 (×2): 40 mg via SUBCUTANEOUS
  Filled 2021-04-15 (×2): qty 0.4

## 2021-04-15 MED ORDER — MELATONIN 3 MG PO TABS
3.0000 mg | ORAL_TABLET | Freq: Every day | ORAL | Status: DC
  Administered 2021-04-15 (×2): 3 mg via ORAL
  Filled 2021-04-15 (×2): qty 1

## 2021-04-15 MED ORDER — POLYETHYLENE GLYCOL 3350 17 G PO PACK
17.0000 g | PACK | Freq: Every day | ORAL | Status: DC
  Administered 2021-04-15 – 2021-04-17 (×3): 17 g via ORAL
  Filled 2021-04-15 (×3): qty 1

## 2021-04-15 MED ORDER — DAPAGLIFLOZIN PROPANEDIOL 5 MG PO TABS: 5.0000 mg | ORAL_TABLET | Freq: Every day | ORAL | Status: DC

## 2021-04-15 MED ORDER — DAPAGLIFLOZIN PROPANEDIOL 10 MG PO TABS
10.0000 mg | ORAL_TABLET | Freq: Every day | ORAL | Status: DC
  Administered 2021-04-15 – 2021-04-17 (×3): 10 mg via ORAL
  Filled 2021-04-15 (×3): qty 1

## 2021-04-15 MED ORDER — FERROUS SULFATE 325 (65 FE) MG PO TABS
325.0000 mg | ORAL_TABLET | Freq: Every day | ORAL | Status: DC
  Administered 2021-04-15 – 2021-04-17 (×3): 325 mg via ORAL
  Filled 2021-04-15 (×3): qty 1

## 2021-04-15 NOTE — Evaluation (Addendum)
Physical Therapy Evaluation Patient Details Name: Mitchell King MRN: [id number] DOB: [birth year] Today's Date: 04/15/2021  History of Present Illness  70 y.o. male admitted 04/13/21 for acute CHF exacerbation.  PMH significant for CHF, hepatitis C, COPD, diabetes, HTN, history of PE.  Pt on 2 L O2 at baseline.  Clinical Impression  Pt admitted with above diagnosis. Pt currently with functional limitations due to the deficits listed below (see PT Problem List). Pt will benefit from skilled PT to increase their independence and safety with mobility to allow discharge to the venue listed below.  Pt assisted with ambulating in hallway and mobilizing well with RW however requiring at least 6L O2 [place] for physical activity.  Pt on 2L O2 at baseline and reports having a blocked left nostril (states RN flushed with saline yesterday) so RN notified.  Pt eager to participate to maintain his independence to d/c home.  Pt also reports not feeling very motivated to be active or social since his February admission.   SATURATION QUALIFICATIONS: (This note is used to comply with regulatory documentation for home oxygen)  Patient Saturations on 2L at Rest = 83%  Patient Saturations on 4 Liters of oxygen while Ambulating = 86%  Patient Saturations on 6 Liters of oxygen while Ambulating = 92%  Please briefly explain why patient needs home oxygen: to maintain oxygen saturations above 88% at rest and during physical activities such as ambulation.    Recommendations for follow up therapy are one component of a multi-disciplinary discharge planning process, led by the attending physician.  Recommendations may be updated based on patient status, additional functional criteria and insurance authorization.  Follow Up Recommendations Home health PT    Assistance Recommended at Discharge    Functional Status Assessment Patient has had a recent decline in their functional status and demonstrates the ability to make  significant improvements in function in a reasonable and predictable amount of time.  Equipment Recommendations  None recommended by PT    Recommendations for Other Services       Precautions / Restrictions Precautions Precautions: Fall Precaution Comments: monitor sats      Mobility  Bed Mobility Overal bed mobility: Needs Assistance Bed Mobility: Supine to Sit     Supine to sit: HOB elevated;Supervision          Transfers Overall transfer level: Needs assistance Equipment used: Rolling walker (2 wheels) Transfers: Sit to/from Stand Sit to Stand: Min guard           General transfer comment: min/guard for safety    Ambulation/Gait Ambulation/Gait assistance: Min guard Gait Distance (Feet): 360 Feet Assistive device: Rolling walker (2 wheels) Gait Pattern/deviations: Step-through pattern;Decreased stride length     General Gait Details: steady with RW, required 6L O2 [place] for SPO2 92%  Stairs            Wheelchair Mobility    Modified Rankin (Stroke Patients Only)       Balance                                             Pertinent Vitals/Pain Pain Assessment: No/denies pain    Home Living Family/patient expects to be discharged to:: Private residence Living Arrangements: Children (son and daughter-in-law)   Type of Home: House Home Access: Stairs to enter Entrance Stairs-Rails: Right Entrance Stairs-Number of Steps: 3   Home Layout:  One level Home Equipment: Agricultural consultant (2 wheels);Rollator (4 wheels)      Prior Function Prior Level of Function : Independent/Modified Independent             Mobility Comments: pt reports 2L baseline, pt also indicates he is mostly sedentary stating since Feb admission he has lost his motivation for doing much of anything.  pt states his son encourages him to be more active       Hand Dominance        Extremity/Trunk Assessment        Lower Extremity  Assessment Lower Extremity Assessment: Generalized weakness    Cervical / Trunk Assessment Cervical / Trunk Assessment: Normal  Communication   Communication: No difficulties  Cognition Arousal/Alertness: Awake/alert Behavior During Therapy: WFL for tasks assessed/performed Overall Cognitive Status: Within Functional Limits for tasks assessed                                          General Comments      Exercises     Assessment/Plan    PT Assessment Patient needs continued PT services  PT Problem List Decreased strength;Decreased mobility;Decreased activity tolerance;Decreased knowledge of use of DME;Cardiopulmonary status limiting activity       PT Treatment Interventions Gait training;DME instruction;Therapeutic exercise;Balance training;Functional mobility training;Therapeutic activities;Patient/family education    PT Goals (Current goals can be found in the Care Plan section)  Acute Rehab PT Goals PT Goal Formulation: With patient Time For Goal Achievement: 04/29/21 Potential to Achieve Goals: Good    Frequency Min 3X/week   Barriers to discharge        Co-evaluation               AM-PAC PT "6 Clicks" Mobility  Outcome Measure Help needed turning from your back to your side while in a flat bed without using bedrails?: A Little Help needed moving from lying on your back to sitting on the side of a flat bed without using bedrails?: A Little Help needed moving to and from a bed to a chair (including a wheelchair)?: A Little Help needed standing up from a chair using your arms (e.g., wheelchair or bedside chair)?: A Little Help needed to walk in hospital room?: A Little Help needed climbing 3-5 steps with a railing? : A Little 6 Click Score: 18    End of Session Equipment Utilized During Treatment: Gait belt Activity Tolerance: Patient tolerated treatment well Patient left: with call bell/phone within reach;in chair;with chair alarm  set Nurse Communication: Mobility status PT Visit Diagnosis: Difficulty in walking, not elsewhere classified (R26.2)    Time: 2595-6387 PT Time Calculation (min) (ACUTE ONLY): 19 min   Charges:   PT Evaluation $PT Eval Low Complexity: 1 Low        Kati PT, DPT Acute Rehabilitation Services Pager: [phone] Office: [phone]   Janan Halter [place] 04/15/2021, 3:45 PM

## 2021-04-15 NOTE — TOC Initial Note (Signed)
Transition of Care [facility]) - Initial/Assessment Note    Patient Details  Name: Mitchell King MRN: [id number] Date of Birth: Dec 31, 1950  [facility]) CM/SW Contact:    Shade Flood, LCSW Phone Number: 04/15/2021, 2:26 PM  Clinical Narrative:                  Pt admitted from home. Received TOC consult for HF HH Screen. Met with pt today to assess and review dc planning. Per pt, he lives with his son. Pt reports that he is independent in ADLs at home. Pt states that he hasn't driven since February. He reports that his son drives him to appointments. Pt's medications are delivered to the house. Pt has home O2 in place.  Pt states that he follows a heart healthy diet. He weighs every other day. Pt states he is not prescribed a diuretic at home.   Pt is anticipating he will return home at dc. Pt states he has been to [facility] in the past and he has had HH. Pt agreeable to [facility] again if needed.  Assigned TOC will follow and assist as needed with dc planning.  Expected Discharge Plan: [place] Barriers to Discharge: Continued Medical Work up   Patient Goals and CMS Choice Patient states their goals for this hospitalization and ongoing recovery are:: go home      Expected Discharge Plan and Services Expected Discharge Plan: [place] In-house Referral: Clinical Social Work     Living arrangements for the past 2 months: Single Family Home                                      Prior Living Arrangements/Services Living arrangements for the past 2 months: Single Family Home Lives with:: Adult Children Patient language and need for interpreter reviewed:: Yes Do you feel safe going back to the place where you live?: Yes      Need for Family Participation in Patient Care: No (Comment) Care giver support system in place?: Yes (comment)   Criminal Activity/Legal Involvement Pertinent to Current Situation/Hospitalization: No -  Comment as needed  Activities of Daily Living Home Assistive Devices/Equipment: Oxygen, Eyeglasses ADL Screening (condition at time of admission) Patient's cognitive ability adequate to safely complete daily activities?: Yes Is the patient deaf or have difficulty hearing?: No Does the patient have difficulty seeing, even when wearing glasses/contacts?: No Does the patient have difficulty concentrating, remembering, or making decisions?: No Patient able to express need for assistance with ADLs?: Yes Does the patient have difficulty dressing or bathing?: No Independently performs ADLs?: Yes (appropriate for developmental age) Does the patient have difficulty walking or climbing stairs?: Yes (gets sob) Weakness of Legs: Both Weakness of Arms/Hands: Both  Permission Sought/Granted                  Emotional Assessment Appearance:: Appears stated age Attitude/Demeanor/Rapport: Engaged Affect (typically observed): Pleasant Orientation: : Oriented to Self, Oriented to Place, Oriented to Situation, Oriented to  Time Alcohol / Substance Use: Not Applicable Psych Involvement: No (comment)  Admission diagnosis:  CHF exacerbation ([place]) [I50.9] Bilateral lower extremity edema [R60.0] Anemia, unspecified type [D64.9] Acute congestive heart failure, unspecified heart failure type [facility]) [I50.9] Patient Active Problem List   Diagnosis Date Noted   Acute congestive heart failure ([place]) 04/13/2021   Anemia 04/13/2021   Malnutrition of moderate  degree 08/14/2020   Hyperammonemia (HCC)    Acute respiratory failure (HCC)    Acute cor pulmonale ([place]) 08/06/2020   History of pulmonary embolus (PE) 08/06/2020   Right heart failure ([place]) 08/06/2020   Acute exacerbation of CHF (congestive heart failure) ([place]) 08/04/2020   Essential hypertension 08/04/2020   Diabetes mellitus ([place]) 08/04/2020   Nondependent alcohol abuse, continuous drinking behavior 08/04/2020   Acute hepatitis C 10/27/2001    Chronic obstructive pulmonary disease ([place]) 08/21/2001   PCP:  Yvonna Alanis, NP Pharmacy:   [place], [place] - [address] [place] [address] Phone: [phone] Fax: [phone]  [id number] - [address], [address] [place] [address] Phone: [phone] Fax: [phone]  [facility] [address] [place] [address] Phone: [phone] Fax: [phone]  [id number] [address] [place]Phone: [phone]: [phone] [place] [address] [place] [address]Phone: [phone]: [phone] [place]: [phone]: [phone]    Social Determinants of Health (SDOH) Interventions    Readmission Risk Interventions No flowsheet data found.

## 2021-04-15 NOTE — Progress Notes (Signed)
PROGRESS NOTE  Mitchell King    DOB: 04-Oct-1950, 70 y.o.  ZYS:[id number]  PCP: Octavia Heir, NP   Code Status: Full Code   DOA: 04/13/2021   LOS: 0  Brief Narrative of Current Hospitalization  Mitchell King is a 70 y.o. male with a PMH significant for CHF, hepatitis C, COPD, diabetes, HTN, history of PE.  On 2 L O2 at baseline. They presented from home to the ED on 04/13/2021 with shortness of breath and edema x 1 week.  In the ED, it was found that they had CHF exacerbation. They were treated with diuresis.  Patient was admitted to medicine service for further workup and management of heart failure as outlined in detail below.  04/15/21 -stable, improved  Assessment & Plan  Principal Problem:   Acute congestive heart failure (HCC) Active Problems:   Chronic obstructive pulmonary disease (HCC)   Essential hypertension   Diabetes mellitus (HCC)   History of pulmonary embolus (PE)   Right heart failure (HCC)   Anemia  CHF exacerbation-clinically improved volume status with decreased lower extremity swelling and improvement in respiratory status to baseline oxygen requirement of 2 L. Echo showing concentric enlargement of LV with normal systolic function and did not assess diastolic dysfunction. R heart dysfunction and evidence of pulmonary hypertension. - continue IV Lasix 60 mg twice daily today with plans to change to torsemide tomorrow.  Discussed with patient using as needed diuretics at home based on his dry weight and patient shows good understanding and agreement with this plan.  He does have a scale at home. -Strict I's/O -Daily weights, evaluate for dry weight to help decide on home diuretic therapy.  Patient will likely be ready for discharge 10/27. -OOB as tolerated - SGLT-2i discussed with patient and will be added today -Tolerating metoprolol well.  We will continue at current dose and monitor vital signs closely. -Consider changing home amlodipine to  spironolactone  COPD on 2 L oxygen at baseline and currently not having any respiratory distress. -Monitor with routine vital signs -Recommend follow-up with pulmonology outpatient  Iron deficiency Anemia-hemoglobin stable at 8.8. Anticoagulation was held on admission for suspected GI bleed. -CBC a.m. -Add iron supplement daily as well as MiraLAX daily -Assess for anemia panel and colonoscopy history  HTN  tachycardia-chronic, stable and normotensive this admission asymptomatic heart rate up to 130 sinus rhythm -Amlodipine was not continued on admission.  Consider adding spironolactone if needing better blood pressure control.  History of PE -Home medications including Eliquis is being held and can likely be discontinued  History of hepatitis C-unknown treatment history.  Transaminases normal on admission.  Hepatitis panel pending -CMP a.m.  DVT prophylaxis:    Lovenox  Diet:  Diet Orders (From admission, onward)     Start     Ordered   04/13/21 2100  Diet heart healthy/carb modified Room service appropriate? Yes; Fluid consistency: Thin; Fluid restriction: 1800 mL Fluid  Diet effective now       Question Answer Comment  Diet-HS Snack? Nothing   Room service appropriate? Yes   Fluid consistency: Thin   Fluid restriction: 1800 mL Fluid      04/13/21 2103            Subjective 04/15/21    Pt reports feeling back to his baseline respiratory status and fluid status.  Denies any more heart flutter.  Disposition Plan & Communication  Status is: Inpatient  The patient will require care spanning > 2 midnights  and should be moved to inpatient because: Ongoing diuresis    Inpatient Home with home health  Family Communication: Son on phone Consults, Procedures, Significant Events  Consultants:  None  Procedures/significant events:  Echo  Objective   Vitals:   04/14/21 2108 04/15/21 0126 04/15/21 0532 04/15/21 0748  BP: 127/83 115/70 (!) 112/55   Pulse: 66 89 69    Resp: Temp: 98.9 F (37.2 C)  98.1 F (36.7 C)   TempSrc: Oral  Oral   SpO2: 96% 94% 96% 94%  Weight:   97.7 kg   Height:        Intake/Output Summary (Last 24 hours) at 04/15/2021 1028 Last data filed at 04/15/2021 0718 Gross per 24 hour  Intake 120 ml  Output 5550 ml  Net -5430 ml   Filed Weights   04/13/21 2259 04/15/21 0532  Weight: 102.2 kg 97.7 kg    Patient BMI: Body mass index is 28.42 kg/m.   Physical Exam: General: awake, alert, NAD HEENT: atraumatic, clear conjunctiva, anicteric sclera, moist mucus membranes, hearing grossly normal Respiratory: CTAB, no wheezes, rales or rhonchi, normal respiratory effort. Cardiovascular: normal S1/S2,  RRR, no JVD, murmurs, rubs, gallops, quick capillary refill  Gastrointestinal: soft, NT, ND, no HSM felt Nervous: A&O x3. no gross focal neurologic deficits, normal speech Extremities: moves all equally, non pitting lower extremity edema, normal tone Skin: dry, intact, normal temperature, normal color, No rashes, lesions or ulcers Psychiatry: normal mood, congruent affect  Labs   I have personally reviewed following labs and imaging studies Admission on 04/13/2021  Component Date Value Ref Range Status   B Natriuretic Peptide 04/13/2021 452.4 (A)  0.0 - 100.0 pg/mL Final   Sodium 04/13/2021 134 (A)  135 - 145 mmol/L Final   Potassium 04/13/2021 4.6  3.5 - 5.1 mmol/L Final   Chloride 04/13/2021 98  98 - 111 mmol/L Final   CO2 04/13/2021 30  22 - 32 mmol/L Final   Glucose, Bld 04/13/2021 85  70 - 99 mg/dL Final   BUN 16/03/9603 22  8 - 23 mg/dL Final   Creatinine, Ser 04/13/2021 1.31 (A)  0.61 - 1.24 mg/dL Final   Calcium 54/02/8118 8.9  8.9 - 10.3 mg/dL Final   Total Protein 14/78/2956 8.2 (A)  6.5 - 8.1 g/dL Final   Albumin 21/30/8657 3.7  3.5 - 5.0 g/dL Final   AST 84/69/6295 26  15 - 41 U/L Final   ALT 04/13/2021 21  0 - 44 U/L Final   Alkaline Phosphatase 04/13/2021 55  38 - 126 U/L Final   Total  Bilirubin 04/13/2021 1.1  0.3 - 1.2 mg/dL Final   GFR, Estimated 04/13/2021 59 (A)  >60 mL/min Final   Anion gap 04/13/2021 6  5 - 15 Final   Troponin I (High Sensitivity) 04/13/2021 43 (A)  <18 ng/L Final   WBC 04/13/2021 4.4  4.0 - 10.5 K/uL Final   RBC 04/13/2021 3.17 (A)  4.22 - 5.81 MIL/uL Final   Hemoglobin 04/13/2021 8.3 (A)  13.0 - 17.0 g/dL Final   HCT 28/41/3244 28.1 (A)  39.0 - 52.0 % Final   MCV 04/13/2021 88.6  80.0 - 100.0 fL Final   MCH 04/13/2021 26.2  26.0 - 34.0 pg Final   MCHC 04/13/2021 29.5 (A)  30.0 - 36.0 g/dL Final   RDW 06/23/7251 15.4  11.5 - 15.5 % Final   Platelets 04/13/2021 307  150 - 400 K/uL Final   nRBC 04/13/2021 2.5 (  A)  0.0 - 0.2 % Final   Neutrophils Relative % 04/13/2021 59  % Final   Neutro Abs 04/13/2021 2.7  1.7 - 7.7 K/uL Final   Lymphocytes Relative 04/13/2021 20  % Final   Lymphs Abs 04/13/2021 0.9  0.7 - 4.0 K/uL Final   Monocytes Relative 04/13/2021 17  % Final   Monocytes Absolute 04/13/2021 0.7  0.1 - 1.0 K/uL Final   Eosinophils Relative 04/13/2021 2  % Final   Eosinophils Absolute 04/13/2021 0.1  0.0 - 0.5 K/uL Final   Basophils Relative 04/13/2021 1  % Final   Basophils Absolute 04/13/2021 0.0  0.0 - 0.1 K/uL Final   Immature Granulocytes 04/13/2021 1  % Final   Abs Immature Granulocytes 04/13/2021 0.02  0.00 - 0.07 K/uL Final   Lactic Acid, Venous 04/13/2021 1.2  0.5 - 1.9 mmol/L Final   Specimen Description 04/13/2021    Final                   Value:BLOOD LEFT ANTECUBITAL Performed at [facility], [address], [place], [place] [address]    Special Requests 04/13/2021    Final                   Value:BOTTLES DRAWN AEROBIC AND ANAEROBIC Blood Culture results may not be optimal due to an inadequate volume of blood received in culture bottles Performed at [facility], [address], [place], [place] [address]    Culture 04/13/2021    Final                   Value:NO GROWTH 2  DAYS Performed at [facility], [address], [place], [place] [address]    Report Status 04/13/2021 PENDING   Incomplete   Specimen Description 04/13/2021    Final                   Value:BLOOD RIGHT ANTECUBITAL Performed at [facility], [address], [place], [place] [address]    Special Requests 04/13/2021    Final                   Value:BOTTLES DRAWN AEROBIC AND ANAEROBIC Blood Culture adequate volume Performed at [facility], [address], [place], [place] [address]    Culture 04/13/2021    Final                   Value:NO GROWTH 2 DAYS Performed at [facility], [address], [place], [place] [address]    Report Status 04/13/2021 PENDING   Incomplete   SARS Coronavirus 2 by RT PCR 04/13/2021 NEGATIVE  NEGATIVE Final   Influenza A by PCR 04/13/2021 NEGATIVE  NEGATIVE Final   Influenza B by PCR 04/13/2021 NEGATIVE  NEGATIVE Final   Troponin I (High Sensitivity) 04/13/2021 45 (A)  <18 ng/L Final   ABO/RH(D) 04/13/2021 O POS   Final   Antibody Screen 04/13/2021 NEG   Final   Sample Expiration 04/13/2021    Final                   Value:04/16/2021,2359 Performed at [facility], [address] [place], [place] [address]    Prothrombin Time 04/13/2021 18.4 (A)  11.4 - 15.2 seconds Final   INR 04/13/2021 1.5 (A)  0.8 - 1.2 Final   aPTT 04/13/2021 52 (A)  24 - 36 seconds  Final   Weight 04/14/2021 3,604.96  oz Final   Height 04/14/2021 73  in Final   BP 04/14/2021 134/84  mmHg Final   S' Lateral 04/14/2021 3.00  cm Final   Area-P 1/2 04/14/2021 6.43  cm2 Final   Magnesium 04/13/2021 2.0  1.7 - 2.4 mg/dL Final   WBC 44/96/7591 4.4  4.0 - 10.5 K/uL Final   RBC 04/13/2021 3.20 (A)  4.22 - 5.81 MIL/uL Final   Hemoglobin 04/13/2021 8.3 (A)  13.0 - 17.0 g/dL Final   HCT 63/84/6659 28.2 (A)  39.0 - 52.0 % Final   MCV 04/13/2021 88.1  80.0 - 100.0 fL Final   MCH 04/13/2021 25.9 (A)  26.0 - 34.0 pg Final    MCHC 04/13/2021 29.4 (A)  30.0 - 36.0 g/dL Final   RDW 93/57/0177 15.6 (A)  11.5 - 15.5 % Final   Platelets 04/13/2021 306  150 - 400 K/uL Final   nRBC 04/13/2021 2.0 (A)  0.0 - 0.2 % Final   Sodium 04/14/2021 135  135 - 145 mmol/L Final   Potassium 04/14/2021 4.1  3.5 - 5.1 mmol/L Final   Chloride 04/14/2021 97 (A)  98 - 111 mmol/L Final   CO2 04/14/2021 31  22 - 32 mmol/L Final   Glucose, Bld 04/14/2021 86  70 - 99 mg/dL Final   BUN 93/90/3009 25 (A)  8 - 23 mg/dL Final   Creatinine, Ser 04/14/2021 1.41 (A)  0.61 - 1.24 mg/dL Final   Calcium 23/30/0762 9.1  8.9 - 10.3 mg/dL Final   Total Protein 26/33/3545 8.1  6.5 - 8.1 g/dL Final   Albumin 62/56/3893 3.7  3.5 - 5.0 g/dL Final   AST 73/42/8768 30  15 - 41 U/L Final   ALT 04/14/2021 22  0 - 44 U/L Final   Alkaline Phosphatase 04/14/2021 56  38 - 126 U/L Final   Total Bilirubin 04/14/2021 1.0  0.3 - 1.2 mg/dL Final   GFR, Estimated 04/14/2021 54 (A)  >60 mL/min Final   Anion gap 04/14/2021 7  5 - 15 Final   WBC 04/14/2021 5.1  4.0 - 10.5 K/uL Final   RBC 04/14/2021 3.16 (A)  4.22 - 5.81 MIL/uL Final   Hemoglobin 04/14/2021 8.3 (A)  13.0 - 17.0 g/dL Final   HCT 11/57/2620 28.0 (A)  39.0 - 52.0 % Final   MCV 04/14/2021 88.6  80.0 - 100.0 fL Final   MCH 04/14/2021 26.3  26.0 - 34.0 pg Final   MCHC 04/14/2021 29.6 (A)  30.0 - 36.0 g/dL Final   RDW 35/59/7416 15.7 (A)  11.5 - 15.5 % Final   Platelets 04/14/2021 308  150 - 400 K/uL Final   nRBC 04/14/2021 2.2 (A)  0.0 - 0.2 % Final   ABO/RH(D) 04/13/2021    Final                   Value:O POS Performed at [facility], [address], [place], [place] [address]    Vitamin B-12 04/15/2021 507  180 - 914 pg/mL Final   Folate 04/15/2021 7.3  >5.9 ng/mL Final   Iron 04/15/2021 15 (A)  45 - 182 ug/dL Final   TIBC 64/68/0321 482 (A)  250 - 450 ug/dL Final   Saturation Ratios 04/15/2021 3 (A)  17.9 - 39.5 % Final   UIBC 04/15/2021 467  ug/dL Final   Ferritin 22/48/2500  11 (A)  24 - 336 ng/mL Final   Retic Ct Pct 04/15/2021 2.0  0.4 - 3.1 % Final   RBC. 04/15/2021 3.45 (A)  4.22 - 5.81 MIL/uL Final   Retic Count, Absolute 04/15/2021 67.3  19.0 - 186.0 K/uL Final   Immature Retic Fract 04/15/2021 25.1 (A)  2.3 - 15.9 % Final   Hepatitis B Surface Ag 04/15/2021 NON REACTIVE  NON REACTIVE Final   HCV Ab 04/15/2021 PENDING  NON REACTIVE Incomplete   Hep A IgM 04/15/2021 PENDING  NON REACTIVE Incomplete   Hep B C IgM 04/15/2021 PENDING  NON REACTIVE Incomplete   Sodium 04/15/2021 133 (A)  135 - 145 mmol/L Final   Potassium 04/15/2021 3.6  3.5 - 5.1 mmol/L Final   Chloride 04/15/2021 89 (A)  98 - 111 mmol/L Final   CO2 04/15/2021 37 (A)  22 - 32 mmol/L Final   Glucose, Bld 04/15/2021 99  70 - 99 mg/dL Final   BUN 51/76/1607 29 (A)  8 - 23 mg/dL Final   Creatinine, Ser 04/15/2021 1.38 (A)  0.61 - 1.24 mg/dL Final   Calcium 37/03/6268 9.0  8.9 - 10.3 mg/dL Final   Total Protein 48/54/6270 8.5 (A)  6.5 - 8.1 g/dL Final   Albumin 35/00/9381 3.8  3.5 - 5.0 g/dL Final   AST 82/99/3716 35  15 - 41 U/L Final   ALT 04/15/2021 21  0 - 44 U/L Final   Alkaline Phosphatase 04/15/2021 61  38 - 126 U/L Final   Total Bilirubin 04/15/2021 0.9  0.3 - 1.2 mg/dL Final   GFR, Estimated 04/15/2021 55 (A)  >60 mL/min Final   Anion gap 04/15/2021 7  5 - 15 Final   WBC 04/15/2021 4.4  4.0 - 10.5 K/uL Final   RBC 04/15/2021 3.39 (A)  4.22 - 5.81 MIL/uL Final   Hemoglobin 04/15/2021 8.8 (A)  13.0 - 17.0 g/dL Final   HCT 96/78/9381 29.7 (A)  39.0 - 52.0 % Final   MCV 04/15/2021 87.6  80.0 - 100.0 fL Final   MCH 04/15/2021 26.0  26.0 - 34.0 pg Final   MCHC 04/15/2021 29.6 (A)  30.0 - 36.0 g/dL Final   RDW 01/75/1025 15.8 (A)  11.5 - 15.5 % Final   Platelets 04/15/2021 341  150 - 400 K/uL Final   nRBC 04/15/2021 1.4 (A)  0.0 - 0.2 % Final    Imaging Studies  ECHOCARDIOGRAM COMPLETE  Result Date: 04/14/2021    ECHOCARDIOGRAM REPORT   Patient Name:   TAVIS KRING Date of Exam:  04/14/2021 Medical Rec #:  [id number]       Height:       73.0 in Accession #:    [id number]      Weight:       225.3 lb Date of Birth:  08/05/1950        BSA:          2.263 m Patient Age:    70 years        BP:           134/84 mmHg Patient Gender: M               HR:           131 bpm. Exam Location:  Inpatient Procedure: 2D Echo, Cardiac Doppler and Color Doppler Indications:    Dyspnea  History:        Patient has prior history of Echocardiogram examinations, most                 recent 08/05/2020. CHF, COPD, Arrythmias:Tachycardia,  Signs/Symptoms:Dyspnea and Edema; Risk Factors:Former Smoker,                 Hypertension and Diabetes. Hep. C, Pulmonary embolism.  Sonographer:    Lavenia Atlas RDCS Referring Phys: [id number] Cecille Po MELVIN IMPRESSIONS  1. Left ventricular ejection fraction, by estimation, is 60 to 65%. The left ventricle has normal function. The left ventricle has no regional wall motion abnormalities. There is moderate concentric left ventricular hypertrophy. Left ventricular diastolic function could not be evaluated. There is the interventricular septum is flattened in systole and diastole, consistent with right ventricular pressure and volume overload.  2. Right ventricular systolic function is moderately reduced. The right ventricular size is moderately enlarged. There is severely elevated pulmonary artery systolic pressure. The estimated right ventricular systolic pressure is 67.1 mmHg.  3. Left atrial size was mildly dilated.  4. Right atrial size was mildly dilated.  5. The mitral valve is grossly normal. No evidence of mitral valve regurgitation. No evidence of mitral stenosis.  6. The aortic valve is tricuspid. Aortic valve regurgitation is not visualized. Mild aortic valve sclerosis is present, with no evidence of aortic valve stenosis.  7. The inferior vena cava is dilated in size with <50% respiratory variability, suggesting right atrial pressure of 15 mmHg.  Comparison(s): Changes from prior study are noted. RV is moderately dilated with moderately reduced function. RVSP 67 mmHG. FINDINGS  Left Ventricle: Left ventricular ejection fraction, by estimation, is 60 to 65%. The left ventricle has normal function. The left ventricle has no regional wall motion abnormalities. The left ventricular internal cavity size was small. There is moderate  concentric left ventricular hypertrophy. The interventricular septum is flattened in systole and diastole, consistent with right ventricular pressure and volume overload. Left ventricular diastolic function could not be evaluated due to atrial fibrillation. Left ventricular diastolic function could not be evaluated. Right Ventricle: The right ventricular size is moderately enlarged. No increase in right ventricular wall thickness. Right ventricular systolic function is moderately reduced. There is severely elevated pulmonary artery systolic pressure. The tricuspid regurgitant velocity is 3.61 m/s, and with an assumed right atrial pressure of 15 mmHg, the estimated right ventricular systolic pressure is 67.1 mmHg. Left Atrium: Left atrial size was mildly dilated. Right Atrium: Right atrial size was mildly dilated. Pericardium: There is no evidence of pericardial effusion. Mitral Valve: The mitral valve is grossly normal. No evidence of mitral valve regurgitation. No evidence of mitral valve stenosis. Tricuspid Valve: The tricuspid valve is grossly normal. Tricuspid valve regurgitation is mild . No evidence of tricuspid stenosis. Aortic Valve: The aortic valve is tricuspid. Aortic valve regurgitation is not visualized. Mild aortic valve sclerosis is present, with no evidence of aortic valve stenosis. Pulmonic Valve: The pulmonic valve was grossly normal. Pulmonic valve regurgitation is not visualized. No evidence of pulmonic stenosis. Aorta: The aortic root is normal in size and structure. Venous: The inferior vena cava is dilated in  size with less than 50% respiratory variability, suggesting right atrial pressure of 15 mmHg. IAS/Shunts: The atrial septum is grossly normal.  LEFT VENTRICLE PLAX 2D LVIDd:         4.40 cm   Diastology LVIDs:         3.00 cm   LV e' medial:    10.60 cm/s LV PW:         1.50 cm   LV E/e' medial:  13.9 LV IVS:        1.60 cm   LV e'  lateral:   15.90 cm/s LVOT diam:     2.20 cm   LV E/e' lateral: 9.2 LV SV:         54 LV SV Index:   24 LVOT Area:     3.80 cm  RIGHT VENTRICLE RV Basal diam:  4.30 cm RV S prime:     10.00 cm/s TAPSE (M-mode): 1.4 cm LEFT ATRIUM             Index        RIGHT ATRIUM           Index LA diam:        4.70 cm 2.08 cm/m   RA Area:     27.40 cm LA Vol (A2C):   72.4 ml 31.99 ml/m  RA Volume:   94.00 ml  41.53 ml/m LA Vol (A4C):   77.3 ml 34.15 ml/m LA Biplane Vol: 78.2 ml 34.55 ml/m  AORTIC VALVE LVOT Vmax:   87.00 cm/s LVOT Vmean:  62.000 cm/s LVOT VTI:    0.142 m  AORTA Ao Root diam: 3.30 cm MITRAL VALVE                TRICUSPID VALVE MV Area (PHT): 6.43 cm     TR Peak grad:   52.1 mmHg MV Decel Time: 118 msec     TR Vmax:        361.00 cm/s MV E velocity: 147.00 cm/s                             SHUNTS                             Systemic VTI:  0.14 m                             Systemic Diam: 2.20 cm Lennie Odor MD Electronically signed by Lennie Odor MD Signature Date/Time: 04/14/2021/12:29:12 PM    Final    Medications   Scheduled Meds:  dapagliflozin propanediol  5 mg Oral Daily   enoxaparin (LOVENOX) injection  40 mg Subcutaneous Q24H   ferrous sulfate  325 mg Oral Q breakfast   furosemide  60 mg Intravenous Q12H   mouth rinse  15 mL Mouth Rinse BID   melatonin  3 mg Oral QHS   metoprolol tartrate  25 mg Oral BID   polyethylene glycol  17 g Oral Daily   sodium chloride flush  3 mL Intravenous Q12H   umeclidinium bromide  1 puff Inhalation Daily   No recently discontinued medications to reconcile  LOS: 0 days   Time spent: >7min  Leeroy Bock,  DO [facility] 04/15/2021, 10:28 AM    Please refer to amion to contact the [facility] Attending or Consulting provider for this pt  [email or web address] Available by Epic secure chat 7AM-7PM. If 7PM-7AM, please contact night-coverage

## 2021-04-16 DIAGNOSIS — I50813 Acute on chronic right heart failure: Secondary | ICD-10-CM

## 2021-04-16 DIAGNOSIS — I50811 Acute right heart failure: Secondary | ICD-10-CM | POA: Diagnosis not present

## 2021-04-16 DIAGNOSIS — J449 Chronic obstructive pulmonary disease, unspecified: Secondary | ICD-10-CM | POA: Diagnosis not present

## 2021-04-16 DIAGNOSIS — I509 Heart failure, unspecified: Secondary | ICD-10-CM | POA: Diagnosis not present

## 2021-04-16 DIAGNOSIS — D508 Other iron deficiency anemias: Secondary | ICD-10-CM | POA: Diagnosis not present

## 2021-04-16 LAB — BASIC METABOLIC PANEL WITH GFR
Anion gap: 7 (ref 5–15)
BUN: 31 mg/dL — ABNORMAL HIGH (ref 8–23)
CO2: 38 mmol/L — ABNORMAL HIGH (ref 22–32)
Calcium: 9.2 mg/dL (ref 8.9–10.3)
Chloride: 90 mmol/L — ABNORMAL LOW (ref 98–111)
Creatinine, Ser: 1.29 mg/dL — ABNORMAL HIGH (ref 0.61–1.24)
GFR, Estimated: 60 mL/min — ABNORMAL LOW
Glucose, Bld: 134 mg/dL — ABNORMAL HIGH (ref 70–99)
Potassium: 4 mmol/L (ref 3.5–5.1)
Sodium: 135 mmol/L (ref 135–145)

## 2021-04-16 MED ORDER — LIP MEDEX EX OINT: TOPICAL_OINTMENT | CUTANEOUS | Status: DC | PRN

## 2021-04-16 MED ORDER — MELATONIN 3 MG PO TABS
3.0000 mg | ORAL_TABLET | Freq: Every day | ORAL | Status: DC
  Administered 2021-04-16: 3 mg via ORAL
  Filled 2021-04-16: qty 1

## 2021-04-16 MED ORDER — TORSEMIDE 20 MG PO TABS
20.0000 mg | ORAL_TABLET | Freq: Every day | ORAL | Status: DC
  Administered 2021-04-16 – 2021-04-17 (×2): 20 mg via ORAL
  Filled 2021-04-16 (×2): qty 1

## 2021-04-16 MED ORDER — LIP MEDEX EX OINT
TOPICAL_OINTMENT | CUTANEOUS | Status: AC
  Administered 2021-04-16: 75 via TOPICAL
  Filled 2021-04-16: qty 7

## 2021-04-16 NOTE — Progress Notes (Addendum)
PROGRESS NOTE  Mitchell SILVESTRO    DOB: [birth year], 70 y.o.  FUX:[id number]  PCP: Octavia Heir, NP   Code Status: Full Code   DOA: 04/13/2021   LOS: 1  Brief Narrative of Current Hospitalization  Mitchell King is a 70 y.o. male with a PMH significant for CHF, hepatitis C, COPD, diabetes, HTN, history of PE.  On 2 L O2 at baseline. They presented from home to the ED on 04/13/2021 with shortness of breath and edema x 1 week.  In the ED, it was found that they had CHF exacerbation. They were treated with diuresis.  Patient was admitted to medicine service for further workup and management of heart failure as outlined in detail below.  04/16/21 -stable, improved  Assessment & Plan  Principal Problem:   Acute congestive heart failure (HCC) Active Problems:   Chronic obstructive pulmonary disease (HCC)   Essential hypertension   Diabetes mellitus (HCC)   History of pulmonary embolus (PE)   Right heart failure (HCC)   Anemia   CHF exacerbation (HCC)  Acute on chronic right heart failure exacerbation-continues to improve. Good UOP, weight has been declining. Respiratory status back to baseline.  - change to PO torsemide today -Strict I's/O -Daily weights, evaluate for dry weight to help decide on home diuretic therapy.  Patient will likely be ready for discharge 10/28. -OOB as tolerated - continue SGLT-2i -Tolerating metoprolol well.  We will continue at current dose and monitor vital signs closely.  Acute on chronic respiratory failure in setting of COPD on 2 L oxygen at baseline and currently not having any respiratory distress. -Monitor with routine vital signs -Recommend follow-up with pulmonology outpatient  Iron deficiency Anemia-hemoglobin stable. Anticoagulation was held on admission for suspected GI bleed and can be discontinued as he completed course.  -continue iron supplement daily as well as MiraLAX daily -Assess colonoscopy history  HTN  tachycardia-chronic, stable  and normotensive this admission asymptomatic heart rate improved on metoprolol. -Amlodipine was not continued on admission.  Consider adding spironolactone if needing better blood pressure control.  History of PE -Home medications including Eliquis is being held and discontinued for completion of course  History of hepatitis C-unknown treatment history.  Transaminases normal on admission. Acute hepatitis panel negative - follow up with PCP  DVT prophylaxis: enoxaparin (LOVENOX) injection 40 mg Start: 04/15/21 1400   Lovenox  Diet:  Diet Orders (From admission, onward)     Start     Ordered   04/13/21 2100  Diet heart healthy/carb modified Room service appropriate? Yes; Fluid consistency: Thin; Fluid restriction: 1800 mL Fluid  Diet effective now       Question Answer Comment  Diet-HS Snack? Nothing   Room service appropriate? Yes   Fluid consistency: Thin   Fluid restriction: 1800 mL Fluid      04/13/21 2103            Subjective 04/16/21    Pt reports no respiratory distress or chest pain.  Lower extremity edema essentially resolved.  Disposition Plan & Communication  Status is: Inpatient  The patient will require care spanning > 2 midnights and should be moved to inpatient because: Ongoing diuresis    Inpatient Home with home health  Family Communication: None today Consults, Procedures, Significant Events  Consultants:  None  Procedures/significant events:  Echo  Objective   Vitals:   04/15/21 1940 04/16/21 0447 04/16/21 0500 04/16/21 0841  BP: 112/69 104/70  124/72  Pulse: 60 (!) 47  (!)  137  Resp: 18 18 19    Temp:  98.3 F (36.8 C)  97.7 F (36.5 C)  TempSrc:  Oral  Oral  SpO2: 98% 100%  90%  Weight:      Height:        Intake/Output Summary (Last 24 hours) at 04/16/2021 1048 Last data filed at 04/16/2021 0900 Gross per 24 hour  Intake 240 ml  Output 1775 ml  Net -1535 ml   Filed Weights   04/13/21 2259 04/15/21 0532  Weight: 102.2 kg  97.7 kg    Patient BMI: Body mass index is 28.42 kg/m.   Physical Exam: General: awake, alert, NAD HEENT: atraumatic, clear conjunctiva, anicteric sclera, moist mucus membranes, hearing grossly normal Respiratory: CTAB, no wheezes, rales or rhonchi, normal respiratory effort. Cardiovascular: normal S1/S2,  RRR, quick capillary refill  Gastrointestinal: soft, NT, ND, no HSM felt Nervous: A&O x3. no gross focal neurologic deficits, normal speech Extremities: moves all equally, trace lower extremity edema, normal tone Skin: dry, intact, normal temperature, normal color, No rashes, lesions or ulcers Psychiatry: normal mood, congruent affect  Labs   I have personally reviewed following labs and imaging studies Admission on 04/13/2021  Component Date Value Ref Range Status   B Natriuretic Peptide 04/13/2021 452.4 (A)  0.0 - 100.0 pg/mL Final   Sodium 04/13/2021 134 (A)  135 - 145 mmol/L Final   Potassium 04/13/2021 4.6  3.5 - 5.1 mmol/L Final   Chloride 04/13/2021 98  98 - 111 mmol/L Final   CO2 04/13/2021 30  22 - 32 mmol/L Final   Glucose, Bld 04/13/2021 85  70 - 99 mg/dL Final   BUN 04/15/2021 22  8 - 23 mg/dL Final   Creatinine, Ser 04/13/2021 1.31 (A)  0.61 - 1.24 mg/dL Final   Calcium 04/15/2021 8.9  8.9 - 10.3 mg/dL Final   Total Protein 14/43/1540 8.2 (A)  6.5 - 8.1 g/dL Final   Albumin 08/67/6195 3.7  3.5 - 5.0 g/dL Final   AST 09/32/6712 26  15 - 41 U/L Final   ALT 04/13/2021 21  0 - 44 U/L Final   Alkaline Phosphatase 04/13/2021 55  38 - 126 U/L Final   Total Bilirubin 04/13/2021 1.1  0.3 - 1.2 mg/dL Final   GFR, Estimated 04/13/2021 59 (A)  >60 mL/min Final   Anion gap 04/13/2021 6  5 - 15 Final   Troponin I (High Sensitivity) 04/13/2021 43 (A)  <18 ng/L Final   WBC 04/13/2021 4.4  4.0 - 10.5 K/uL Final   RBC 04/13/2021 3.17 (A)  4.22 - 5.81 MIL/uL Final   Hemoglobin 04/13/2021 8.3 (A)  13.0 - 17.0 g/dL Final   HCT 04/15/2021 28.1 (A)  39.0 - 52.0 % Final   MCV  04/13/2021 88.6  80.0 - 100.0 fL Final   MCH 04/13/2021 26.2  26.0 - 34.0 pg Final   MCHC 04/13/2021 29.5 (A)  30.0 - 36.0 g/dL Final   RDW 04/15/2021 15.4  11.5 - 15.5 % Final   Platelets 04/13/2021 307  150 - 400 K/uL Final   nRBC 04/13/2021 2.5 (A)  0.0 - 0.2 % Final   Neutrophils Relative % 04/13/2021 59  % Final   Neutro Abs 04/13/2021 2.7  1.7 - 7.7 K/uL Final   Lymphocytes Relative 04/13/2021 20  % Final   Lymphs Abs 04/13/2021 0.9  0.7 - 4.0 K/uL Final   Monocytes Relative 04/13/2021 17  % Final   Monocytes Absolute 04/13/2021 0.7  0.1 - 1.0 K/uL  Final   Eosinophils Relative 04/13/2021 2  % Final   Eosinophils Absolute 04/13/2021 0.1  0.0 - 0.5 K/uL Final   Basophils Relative 04/13/2021 1  % Final   Basophils Absolute 04/13/2021 0.0  0.0 - 0.1 K/uL Final   Immature Granulocytes 04/13/2021 1  % Final   Abs Immature Granulocytes 04/13/2021 0.02  0.00 - 0.07 K/uL Final   Lactic Acid, Venous 04/13/2021 1.2  0.5 - 1.9 mmol/L Final   Specimen Description 04/13/2021    Final                   Value:BLOOD LEFT ANTECUBITAL Performed at [facility], [address], [place], [place] [address]    Special Requests 04/13/2021    Final                   Value:BOTTLES DRAWN AEROBIC AND ANAEROBIC Blood Culture results may not be optimal due to an inadequate volume of blood received in culture bottles Performed at [facility], [address] [place], [place] [address]    Culture 04/13/2021    Final                   Value:NO GROWTH 3 DAYS Performed at [facility], [address], [place], [place] [address]    Report Status 04/13/2021 PENDING   Incomplete   Specimen Description 04/13/2021    Final                   Value:BLOOD RIGHT ANTECUBITAL Performed at [facility], [address], [place], [place] [address]    Special Requests 04/13/2021    Final                   Value:BOTTLES DRAWN AEROBIC AND ANAEROBIC Blood Culture  adequate volume Performed at [facility], [address], [place], [place] [address]    Culture 04/13/2021    Final                   Value:NO GROWTH 3 DAYS Performed at [facility], [address] [place], [place] [address]    Report Status 04/13/2021 PENDING   Incomplete   SARS Coronavirus 2 by RT PCR 04/13/2021 NEGATIVE  NEGATIVE Final   Influenza A by PCR 04/13/2021 NEGATIVE  NEGATIVE Final   Influenza B by PCR 04/13/2021 NEGATIVE  NEGATIVE Final   Troponin I (High Sensitivity) 04/13/2021 45 (A)  <18 ng/L Final   ABO/RH(D) 04/13/2021 O POS   Final   Antibody Screen 04/13/2021 NEG   Final   Sample Expiration 04/13/2021    Final                   Value:04/16/2021,2359 Performed at [facility], [address], [place], [place] [address]    Prothrombin Time 04/13/2021 18.4 (A)  11.4 - 15.2 seconds Final   INR 04/13/2021 1.5 (A)  0.8 - 1.2 Final   aPTT 04/13/2021 52 (A)  24 - 36 seconds Final   Weight 04/14/2021 3,604.96  oz Final   Height 04/14/2021 73  in Final   BP 04/14/2021 134/84  mmHg Final   S' Lateral 04/14/2021 3.00  cm Final   Area-P 1/2 04/14/2021 6.43  cm2 Final   Magnesium 04/13/2021 2.0  1.7 - 2.4 mg/dL Final   WBC 70/26/3785 4.4  4.0 - 10.5 K/uL Final   RBC 04/13/2021 3.20 (  A)  4.22 - 5.81 MIL/uL Final   Hemoglobin 04/13/2021 8.3 (A)  13.0 - 17.0 g/dL Final   HCT 81/77/1165 28.2 (A)  39.0 - 52.0 % Final   MCV 04/13/2021 88.1  80.0 - 100.0 fL Final   MCH 04/13/2021 25.9 (A)  26.0 - 34.0 pg Final   MCHC 04/13/2021 29.4 (A)  30.0 - 36.0 g/dL Final   RDW 79/08/8331 15.6 (A)  11.5 - 15.5 % Final   Platelets 04/13/2021 306  150 - 400 K/uL Final   nRBC 04/13/2021 2.0 (A)  0.0 - 0.2 % Final   Sodium 04/14/2021 135  135 - 145 mmol/L Final   Potassium 04/14/2021 4.1  3.5 - 5.1 mmol/L Final   Chloride 04/14/2021 97 (A)  98 - 111 mmol/L Final   CO2 04/14/2021 31  22 - 32 mmol/L Final   Glucose, Bld 04/14/2021 86  70 - 99  mg/dL Final   BUN 83/29/1916 25 (A)  8 - 23 mg/dL Final   Creatinine, Ser 04/14/2021 1.41 (A)  0.61 - 1.24 mg/dL Final   Calcium 60/60/0459 9.1  8.9 - 10.3 mg/dL Final   Total Protein 97/74/1423 8.1  6.5 - 8.1 g/dL Final   Albumin 95/32/0233 3.7  3.5 - 5.0 g/dL Final   AST 43/56/8616 30  15 - 41 U/L Final   ALT 04/14/2021 22  0 - 44 U/L Final   Alkaline Phosphatase 04/14/2021 56  38 - 126 U/L Final   Total Bilirubin 04/14/2021 1.0  0.3 - 1.2 mg/dL Final   GFR, Estimated 04/14/2021 54 (A)  >60 mL/min Final   Anion gap 04/14/2021 7  5 - 15 Final   WBC 04/14/2021 5.1  4.0 - 10.5 K/uL Final   RBC 04/14/2021 3.16 (A)  4.22 - 5.81 MIL/uL Final   Hemoglobin 04/14/2021 8.3 (A)  13.0 - 17.0 g/dL Final   HCT 83/72/9021 28.0 (A)  39.0 - 52.0 % Final   MCV 04/14/2021 88.6  80.0 - 100.0 fL Final   MCH 04/14/2021 26.3  26.0 - 34.0 pg Final   MCHC 04/14/2021 29.6 (A)  30.0 - 36.0 g/dL Final   RDW 11/55/2080 15.7 (A)  11.5 - 15.5 % Final   Platelets 04/14/2021 308  150 - 400 K/uL Final   nRBC 04/14/2021 2.2 (A)  0.0 - 0.2 % Final   ABO/RH(D) 04/13/2021    Final                   Value:O POS Performed at [facility], [address], [place], [place] [address]    Vitamin B-12 04/15/2021 507  180 - 914 pg/mL Final   Folate 04/15/2021 7.3  >5.9 ng/mL Final   Iron 04/15/2021 15 (A)  45 - 182 ug/dL Final   TIBC 06/13/4974 482 (A)  250 - 450 ug/dL Final   Saturation Ratios 04/15/2021 3 (A)  17.9 - 39.5 % Final   UIBC 04/15/2021 467  ug/dL Final   Ferritin 30/10/1100 11 (A)  24 - 336 ng/mL Final   Retic Ct Pct 04/15/2021 2.0  0.4 - 3.1 % Final   RBC. 04/15/2021 3.45 (A)  4.22 - 5.81 MIL/uL Final   Retic Count, Absolute 04/15/2021 67.3  19.0 - 186.0 K/uL Final   Immature Retic Fract 04/15/2021 25.1 (A)  2.3 - 15.9 % Final   Hepatitis B Surface Ag 04/15/2021 NON REACTIVE  NON REACTIVE Final   HCV Ab 04/15/2021 Reactive (A)  NON REACTIVE Final  Hep A IgM 04/15/2021 NON REACTIVE  NON  REACTIVE Final   Hep B C IgM 04/15/2021 NON REACTIVE  NON REACTIVE Final   Sodium 04/15/2021 133 (A)  135 - 145 mmol/L Final   Potassium 04/15/2021 3.6  3.5 - 5.1 mmol/L Final   Chloride 04/15/2021 89 (A)  98 - 111 mmol/L Final   CO2 04/15/2021 37 (A)  22 - 32 mmol/L Final   Glucose, Bld 04/15/2021 99  70 - 99 mg/dL Final   BUN 74/25/9563 29 (A)  8 - 23 mg/dL Final   Creatinine, Ser 04/15/2021 1.38 (A)  0.61 - 1.24 mg/dL Final   Calcium 87/56/4332 9.0  8.9 - 10.3 mg/dL Final   Total Protein 95/18/8416 8.5 (A)  6.5 - 8.1 g/dL Final   Albumin 60/63/0160 3.8  3.5 - 5.0 g/dL Final   AST 10/93/2355 35  15 - 41 U/L Final   ALT 04/15/2021 21  0 - 44 U/L Final   Alkaline Phosphatase 04/15/2021 61  38 - 126 U/L Final   Total Bilirubin 04/15/2021 0.9  0.3 - 1.2 mg/dL Final   GFR, Estimated 04/15/2021 55 (A)  >60 mL/min Final   Anion gap 04/15/2021 7  5 - 15 Final   WBC 04/15/2021 4.4  4.0 - 10.5 K/uL Final   RBC 04/15/2021 3.39 (A)  4.22 - 5.81 MIL/uL Final   Hemoglobin 04/15/2021 8.8 (A)  13.0 - 17.0 g/dL Final   HCT 73/22/0254 29.7 (A)  39.0 - 52.0 % Final   MCV 04/15/2021 87.6  80.0 - 100.0 fL Final   MCH 04/15/2021 26.0  26.0 - 34.0 pg Final   MCHC 04/15/2021 29.6 (A)  30.0 - 36.0 g/dL Final   RDW 27/11/2374 15.8 (A)  11.5 - 15.5 % Final   Platelets 04/15/2021 341  150 - 400 K/uL Final   nRBC 04/15/2021 1.4 (A)  0.0 - 0.2 % Final   Sodium 04/16/2021 135  135 - 145 mmol/L Final   Potassium 04/16/2021 4.0  3.5 - 5.1 mmol/L Final   Chloride 04/16/2021 90 (A)  98 - 111 mmol/L Final   CO2 04/16/2021 38 (A)  22 - 32 mmol/L Final   Glucose, Bld 04/16/2021 134 (A)  70 - 99 mg/dL Final   BUN 28/31/5176 31 (A)  8 - 23 mg/dL Final   Creatinine, Ser 04/16/2021 1.29 (A)  0.61 - 1.24 mg/dL Final   Calcium 16/12/3708 9.2  8.9 - 10.3 mg/dL Final   GFR, Estimated 04/16/2021 60 (A)  >60 mL/min Final   Anion gap 04/16/2021 7  5 - 15 Final    Imaging Studies  No results found. Medications    Scheduled Meds:  dapagliflozin propanediol  10 mg Oral Daily   enoxaparin (LOVENOX) injection  40 mg Subcutaneous Q24H   ferrous sulfate  325 mg Oral Q breakfast   mouth rinse  15 mL Mouth Rinse BID   melatonin  3 mg Oral QHS   metoprolol tartrate  25 mg Oral BID   polyethylene glycol  17 g Oral Daily   sodium chloride flush  3 mL Intravenous Q12H   torsemide  20 mg Oral Daily   umeclidinium bromide  1 puff Inhalation Daily   No recently discontinued medications to reconcile  LOS: 1 day   Time spent: >59min  Leeroy Bock, DO [facility] 04/16/2021, 10:48 AM    Please refer to amion to contact the [facility] Attending or Consulting provider for this pt  [email or web address] Available by [employer]  secure chat 7AM-7PM. If 7PM-7AM, please contact night-coverage

## 2021-04-17 ENCOUNTER — Other Ambulatory Visit (HOSPITAL_COMMUNITY): Payer: Self-pay

## 2021-04-17 DIAGNOSIS — J438 Other emphysema: Secondary | ICD-10-CM | POA: Diagnosis not present

## 2021-04-17 DIAGNOSIS — I10 Essential (primary) hypertension: Secondary | ICD-10-CM | POA: Diagnosis not present

## 2021-04-17 DIAGNOSIS — D649 Anemia, unspecified: Secondary | ICD-10-CM | POA: Diagnosis not present

## 2021-04-17 DIAGNOSIS — I509 Heart failure, unspecified: Secondary | ICD-10-CM | POA: Diagnosis not present

## 2021-04-17 MED ORDER — TORSEMIDE 20 MG PO TABS
20.0000 mg | ORAL_TABLET | Freq: Every day | ORAL | 0 refills | Status: DC | PRN
  Filled 2021-04-17 (×2): qty 30, 30d supply, fill #0

## 2021-04-17 MED ORDER — FERROUS SULFATE 325 (65 FE) MG PO TABS
325.0000 mg | ORAL_TABLET | Freq: Every day | ORAL | 0 refills | Status: DC
  Filled 2021-04-17: qty 30, 30d supply, fill #0

## 2021-04-17 MED ORDER — DAPAGLIFLOZIN PROPANEDIOL 10 MG PO TABS
10.0000 mg | ORAL_TABLET | Freq: Every day | ORAL | 0 refills | Status: DC
Start: 2021-04-18 — End: 2021-04-17

## 2021-04-17 MED ORDER — DAPAGLIFLOZIN PROPANEDIOL 10 MG PO TABS
10.0000 mg | ORAL_TABLET | Freq: Every day | ORAL | 0 refills | Status: DC
  Filled 2021-04-17 (×2): qty 30, 30d supply, fill #0

## 2021-04-17 MED ORDER — METOPROLOL TARTRATE 25 MG PO TABS: 25.0000 mg | ORAL_TABLET | Freq: Two times a day (BID) | ORAL | 0 refills | Status: DC

## 2021-04-17 MED ORDER — TORSEMIDE 20 MG PO TABS: 20.0000 mg | ORAL_TABLET | Freq: Every day | ORAL | 0 refills | Status: DC | PRN

## 2021-04-17 MED ORDER — METOPROLOL TARTRATE 25 MG PO TABS
25.0000 mg | ORAL_TABLET | Freq: Two times a day (BID) | ORAL | 0 refills | Status: DC
  Filled 2021-04-17 (×2): qty 60, 30d supply, fill #0

## 2021-04-17 MED ORDER — FERROUS SULFATE 325 (65 FE) MG PO TABS: 325.0000 mg | ORAL_TABLET | Freq: Every day | ORAL | 0 refills | Status: DC

## 2021-04-17 NOTE — TOC Progression Note (Addendum)
Transition of Care [facility]) - Progression Note    Patient Details  Name: Mitchell King MRN: [id number] Date of Birth: [birth year]  [facility] CM/SW Contact  Golda Acre, RN Phone Number: 04/17/2021, 8:55 AM  Clinical Narrative:    [facility]  Expected Discharge Plan: Home w Home Health Services Barriers to Discharge: Continued Medical Work up  Expected Discharge Plan and Services Expected Discharge Plan: Home w Home Health Services In-house Referral: Clinical Social Work     Living arrangements for the past 2 months: Single Family Home                                       Social Determinants of Health (SDOH) Interventions    Readmission Risk Interventions No flowsheet data found.

## 2021-04-17 NOTE — Discharge Summary (Signed)
Physician Discharge Summary  [name] MVE:[id number] DOB: 1951-03-05 DOA: 04/13/2021  PCP: Octavia Heir, NP  Admit date: 04/13/2021 Discharge date: 04/17/2021  Admitted From: Home Disposition: Home  Recommendations for Outpatient Follow-up:  Follow up with PCP at earliest available to monitor BP, HR now switched to metoprolol.  Recommend a referral from PCP to pulmonologist as patient has RHF.  Patient's dry weight 214lbs. Instructed to use toresmide PRN if weight gain of over 2 pounds of dry weight.  Recommend home health PT  Discharge Condition:stable, improved CODE STATUS:  Code Status: Full Code  Regular healthy diet  Brief/Interim Summary: Pt presented with acute on chronic hypoxic respiratory failure. Repeat echo, chest xray, and hypervolemia on exam made R heart failure likely source and he responded well to diuretic treatment. Medications were modified to include addition of metoprolol for tachycardia and HF, IV lasix transitioned to torsemide PRN for fluid overload with caution to not decrease preload too much.  Eliquis was discontinued as he had completed course for provoked PE and was initial concern for occult GI bleed with decreased hgb on presentation. He was started on iron supplement prior to discharge.   Discharge Diagnoses:  Principal Problem:   Acute congestive heart failure (HCC) Active Problems:   Chronic obstructive pulmonary disease (HCC)   Essential hypertension   Diabetes mellitus (HCC)   History of pulmonary embolus (PE)   Right heart failure (HCC)   Anemia   CHF exacerbation (HCC)    Allergies as of 04/17/2021   No Known Allergies      Medication List     STOP taking these medications    amLODipine 5 MG tablet Commonly known as: NORVASC   apixaban 5 MG Tabs tablet Commonly known as: ELIQUIS   lactulose 10 GM/15ML solution Commonly known as: CHRONULAC       TAKE these medications    acetaminophen 500 MG tablet Commonly  known as: TYLENOL Take 1,000 mg by mouth every 8 (eight) hours as needed for mild pain or headache (or headaches).   albuterol 108 (90 Base) MCG/ACT inhaler Commonly known as: VENTOLIN HFA Inhale 2 puffs into the lungs every 4 (four) hours as needed for wheezing or shortness of breath.   dapagliflozin propanediol 10 MG Tabs tablet Commonly known as: FARXIGA Take 1 tablet (10 mg total) by mouth daily. Start taking on: April 18, 2021   ferrous sulfate 325 (65 FE) MG tablet Take 1 tablet (325 mg total) by mouth daily with breakfast. Start taking on: April 18, 2021   ipratropium-albuterol 0.5-2.5 (3) MG/3ML Soln Commonly known as: DUONEB Take 3 mLs by nebulization every 4 (four) hours as needed.   metoprolol tartrate 25 MG tablet Commonly known as: LOPRESSOR Take 1 tablet (25 mg total) by mouth 2 (two) times daily.   Spiriva Respimat 2.5 MCG/ACT Aers Generic drug: Tiotropium Bromide Monohydrate Inhale 1 puff into the lungs in the morning and at bedtime.   torsemide 20 MG tablet Commonly known as: DEMADEX Take 1 tablet (20 mg total) by mouth daily as needed. For a weight over 216lbs, take a tablet of torsemide. If needing a tablet more than 2 days in a row, call your primary doctor.        Follow-up Information     [facility].   Specialty: Home Health Services Why: they will call for first home appointment Contact information: [address] [place] [address] [phone]  No Known Allergies  Consultations: none  Procedures/Studies: CT Angio Chest PE W and/or Wo Contrast  Result Date: 04/13/2021 CLINICAL DATA:  PE suspected, high prob.  Shortness of breath. EXAM: CT ANGIOGRAPHY CHEST WITH CONTRAST TECHNIQUE: Multidetector CT imaging of the chest was performed using the standard protocol during bolus administration of intravenous contrast. Multiplanar CT image reconstructions and MIPs were obtained to evaluate  the vascular anatomy. CONTRAST:  62mL OMNIPAQUE IOHEXOL 350 MG/ML SOLN COMPARISON:  08/05/2020 FINDINGS: Cardiovascular: No filling defects in the pulmonary arteries to suggest pulmonary emboli. Cardiomegaly. Scattered coronary artery and aortic calcifications. No aneurysm. Mediastinum/Nodes: No mediastinal, hilar, or axillary adenopathy. Trachea and esophagus are unremarkable. Thyroid unremarkable. Lungs/Pleura: Mild to moderate centrilobular emphysema. Small right pleural effusion. Compressive atelectasis in the right lower lobe. No confluent opacities. Upper Abdomen: Imaging into the upper abdomen demonstrates no acute findings. Musculoskeletal: Chest wall soft tissues are unremarkable. No acute bony abnormality. Review of the MIP images confirms the above findings. IMPRESSION: No evidence of pulmonary embolus. Small right pleural effusion. Compressive atelectasis in the right lower lobe. Cardiomegaly, scattered coronary artery disease. Aortic Atherosclerosis (ICD10-I70.0) and Emphysema (ICD10-J43.9). Electronically Signed   By: Charlett Nose M.D.   On: 04/13/2021 21:02   DG Chest Port 1 View  Result Date: 04/13/2021 CLINICAL DATA:  Cough and shortness of breath, CHF. EXAM: PORTABLE CHEST 1 VIEW COMPARISON:  Chest x-ray 08/13/2020. FINDINGS: The heart is enlarged, unchanged. There are interstitial opacities in both lower lungs with patchy airspace disease in the right lung base. There is a small right pleural effusion. The heart is enlarged. There are atherosclerotic calcifications of the aorta. There is no pneumothorax. No acute fractures are seen. IMPRESSION: 1. Cardiomegaly with bibasilar opacities, likely mild edema. Infection is not excluded in the right lung base. 2. Small right pleural effusion. Electronically Signed   By: Darliss Cheney M.D.   On: 04/13/2021 16:43   ECHOCARDIOGRAM COMPLETE  Result Date: 04/14/2021    ECHOCARDIOGRAM REPORT   Patient Name:   Mitchell King Date of Exam: 04/14/2021  Medical Rec #:  [id number]       Height:       73.0 in Accession #:    [id number]      Weight:       225.3 lb Date of Birth:  Feb 28, 1951        BSA:          2.263 m Patient Age:    70 years        BP:           134/84 mmHg Patient Gender: M               HR:           131 bpm. Exam Location:  Inpatient Procedure: 2D Echo, Cardiac Doppler and Color Doppler Indications:    Dyspnea  History:        Patient has prior history of Echocardiogram examinations, most                 recent 08/05/2020. CHF, COPD, Arrythmias:Tachycardia,                 Signs/Symptoms:Dyspnea and Edema; Risk Factors:Former Smoker,                 Hypertension and Diabetes. Hep. C, Pulmonary embolism.  Sonographer:    Lavenia Atlas RDCS Referring Phys: [id number] Cecille Po MELVIN IMPRESSIONS  1. Left ventricular ejection fraction, by estimation, is 60  to 65%. The left ventricle has normal function. The left ventricle has no regional wall motion abnormalities. There is moderate concentric left ventricular hypertrophy. Left ventricular diastolic function could not be evaluated. There is the interventricular septum is flattened in systole and diastole, consistent with right ventricular pressure and volume overload.  2. Right ventricular systolic function is moderately reduced. The right ventricular size is moderately enlarged. There is severely elevated pulmonary artery systolic pressure. The estimated right ventricular systolic pressure is 67.1 mmHg.  3. Left atrial size was mildly dilated.  4. Right atrial size was mildly dilated.  5. The mitral valve is grossly normal. No evidence of mitral valve regurgitation. No evidence of mitral stenosis.  6. The aortic valve is tricuspid. Aortic valve regurgitation is not visualized. Mild aortic valve sclerosis is present, with no evidence of aortic valve stenosis.  7. The inferior vena cava is dilated in size with <50% respiratory variability, suggesting right atrial pressure of 15 mmHg. Comparison(s):  Changes from prior study are noted. RV is moderately dilated with moderately reduced function. RVSP 67 mmHG. FINDINGS  Left Ventricle: Left ventricular ejection fraction, by estimation, is 60 to 65%. The left ventricle has normal function. The left ventricle has no regional wall motion abnormalities. The left ventricular internal cavity size was small. There is moderate  concentric left ventricular hypertrophy. The interventricular septum is flattened in systole and diastole, consistent with right ventricular pressure and volume overload. Left ventricular diastolic function could not be evaluated due to atrial fibrillation. Left ventricular diastolic function could not be evaluated. Right Ventricle: The right ventricular size is moderately enlarged. No increase in right ventricular wall thickness. Right ventricular systolic function is moderately reduced. There is severely elevated pulmonary artery systolic pressure. The tricuspid regurgitant velocity is 3.61 m/s, and with an assumed right atrial pressure of 15 mmHg, the estimated right ventricular systolic pressure is 67.1 mmHg. Left Atrium: Left atrial size was mildly dilated. Right Atrium: Right atrial size was mildly dilated. Pericardium: There is no evidence of pericardial effusion. Mitral Valve: The mitral valve is grossly normal. No evidence of mitral valve regurgitation. No evidence of mitral valve stenosis. Tricuspid Valve: The tricuspid valve is grossly normal. Tricuspid valve regurgitation is mild . No evidence of tricuspid stenosis. Aortic Valve: The aortic valve is tricuspid. Aortic valve regurgitation is not visualized. Mild aortic valve sclerosis is present, with no evidence of aortic valve stenosis. Pulmonic Valve: The pulmonic valve was grossly normal. Pulmonic valve regurgitation is not visualized. No evidence of pulmonic stenosis. Aorta: The aortic root is normal in size and structure. Venous: The inferior vena cava is dilated in size with less  than 50% respiratory variability, suggesting right atrial pressure of 15 mmHg. IAS/Shunts: The atrial septum is grossly normal.  LEFT VENTRICLE PLAX 2D LVIDd:         4.40 cm   Diastology LVIDs:         3.00 cm   LV e' medial:    10.60 cm/s LV PW:         1.50 cm   LV E/e' medial:  13.9 LV IVS:        1.60 cm   LV e' lateral:   15.90 cm/s LVOT diam:     2.20 cm   LV E/e' lateral: 9.2 LV SV:         54 LV SV Index:   24 LVOT Area:     3.80 cm  RIGHT VENTRICLE RV Basal diam:  4.30 cm RV  S prime:     10.00 cm/s TAPSE (M-mode): 1.4 cm LEFT ATRIUM             Index        RIGHT ATRIUM           Index LA diam:        4.70 cm 2.08 cm/m   RA Area:     27.40 cm LA Vol (A2C):   72.4 ml 31.99 ml/m  RA Volume:   94.00 ml  41.53 ml/m LA Vol (A4C):   77.3 ml 34.15 ml/m LA Biplane Vol: 78.2 ml 34.55 ml/m  AORTIC VALVE LVOT Vmax:   87.00 cm/s LVOT Vmean:  62.000 cm/s LVOT VTI:    0.142 m  AORTA Ao Root diam: 3.30 cm MITRAL VALVE                TRICUSPID VALVE MV Area (PHT): 6.43 cm     TR Peak grad:   52.1 mmHg MV Decel Time: 118 msec     TR Vmax:        361.00 cm/s MV E velocity: 147.00 cm/s                             SHUNTS                             Systemic VTI:  0.14 m                             Systemic Diam: 2.20 cm Lennie Odor MD Electronically signed by Lennie Odor MD Signature Date/Time: 04/14/2021/12:29:12 PM    Final     Subjective: Patient feels well today. Respiratory status at baseline. No complaints.   Discharge Exam: Vitals:   04/17/21 1240 04/17/21 1243  BP: (!) 116/95 (!) 116/95  Pulse: 84 (!) 55  Resp:  20  Temp:  98.9 F (37.2 C)  SpO2:  95%   General: Pt is alert, awake, not in acute distress Cardiovascular: RRR, S1/S2 +, no rubs, no gallops Respiratory: CTA bilaterally, no wheezing, no rhonchi Abdominal: Soft, NT, ND, bowel sounds + Extremities: no edema, no cyanosis  Labs: Basic Metabolic Panel: Recent Labs  Lab 04/13/21 1805 04/13/21 1938 04/14/21 0505  04/15/21 0443 04/16/21 0437  NA 134*  --  135 133* 135  K 4.6  --  4.1 3.6 4.0  CL 98  --  97* 89* 90*  CO2 30  --  31 37* 38*  GLUCOSE 85  --  86 99 134*  BUN 22  --  25* 29* 31*  CREATININE 1.31*  --  1.41* 1.38* 1.29*  CALCIUM 8.9  --  9.1 9.0 9.2  MG  --  2.0  --   --   --    CBC: Recent Labs  Lab 04/13/21 1805 04/13/21 2324 04/14/21 0505 04/15/21 0443  WBC 4.4 4.4 5.1 4.4  NEUTROABS 2.7  --   --   --   HGB 8.3* 8.3* 8.3* 8.8*  HCT 28.1* 28.2* 28.0* 29.7*  MCV 88.6 88.1 88.6 87.6  PLT 307 306 308 341    Microbiology Recent Results (from the past 240 hour(s))  Blood culture (routine x 2)     Status: None (Preliminary result)   Collection Time: 04/13/21  6:05 PM   Specimen: BLOOD  Result Value Ref Range  Status   Specimen Description   Final    BLOOD LEFT ANTECUBITAL Performed at [facility], [address], [place], [place] [address]    Special Requests   Final    BOTTLES DRAWN AEROBIC AND ANAEROBIC Blood Culture results may not be optimal due to an inadequate volume of blood received in culture bottles Performed at [facility], [address] [place], [place] [address]    Culture   Final    NO GROWTH 4 DAYS Performed at [facility], [address] [place], [place] [address]    Report Status PENDING  Incomplete  Blood culture (routine x 2)     Status: None (Preliminary result)   Collection Time: 04/13/21  6:05 PM   Specimen: BLOOD  Result Value Ref Range Status   Specimen Description   Final    BLOOD RIGHT ANTECUBITAL Performed at [facility], [address], [place], [place] [address]    Special Requests   Final    BOTTLES DRAWN AEROBIC AND ANAEROBIC Blood Culture adequate volume Performed at [facility], [address], [place], [place] [address]    Culture   Final    NO GROWTH 4 DAYS Performed at [facility], [address], [place], [place] [address]     Report Status PENDING  Incomplete  Resp Panel by RT-PCR (Flu A&B, Covid)     Status: None   Collection Time: 04/13/21  6:05 PM   Specimen: Nasopharyngeal(NP) swabs in vial transport medium  Result Value Ref Range Status   SARS Coronavirus 2 by RT PCR NEGATIVE NEGATIVE Final    Comment: (NOTE) SARS-CoV-2 target nucleic acids are NOT DETECTED.  The SARS-CoV-2 RNA is generally detectable in upper respiratory specimens during the acute phase of infection. The lowest concentration of SARS-CoV-2 viral copies this assay can detect is 138 copies/mL. A negative result does not preclude SARS-Cov-2 infection and should not be used as the sole basis for treatment or other patient management decisions. A negative result may occur with  improper specimen collection/handling, submission of specimen other than nasopharyngeal swab, presence of viral mutation(s) within the areas targeted by this assay, and inadequate number of viral copies(<138 copies/mL). A negative result must be combined with clinical observations, patient history, and epidemiological information. The expected result is Negative.  Fact Sheet for Patients:  BloggerCourse.com  Fact Sheet for Healthcare Providers:  SeriousBroker.it  This test is no t yet approved or cleared by the [place] FDA and  has been authorized for detection and/or diagnosis of SARS-CoV-2 by FDA under an Emergency Use Authorization (EUA). This EUA will remain  in effect (meaning this test can be used) for the duration of the COVID-19 declaration under Section 564(b)(1) of the Act, 21 U.S.C.section 360bbb-3(b)(1), unless the authorization is terminated  or revoked sooner.       Influenza A by PCR NEGATIVE NEGATIVE Final   Influenza B by PCR NEGATIVE NEGATIVE Final    Comment: (NOTE) The Xpert Xpress SARS-CoV-2/FLU/RSV plus assay is intended as an aid in the diagnosis of influenza from Nasopharyngeal  swab specimens and should not be used as a sole basis for treatment. Nasal washings and aspirates are unacceptable for Xpert Xpress SARS-CoV-2/FLU/RSV testing.  Fact Sheet for Patients: BloggerCourse.com  Fact Sheet for Healthcare Providers: SeriousBroker.it  This test is not yet approved or cleared by the [place] FDA and has been authorized for detection and/or diagnosis of SARS-CoV-2 by  FDA under an Emergency Use Authorization (EUA). This EUA will remain in effect (meaning this test can be used) for the duration of the COVID-19 declaration under Section 564(b)(1) of the Act, 21 U.S.C. section 360bbb-3(b)(1), unless the authorization is terminated or revoked.  Performed at [facility], [address] [place], [place] [address]     Time coordinating discharge: Over 30 minutes  Leeroy Bock, MD  [facility] 04/17/2021, 2:40 PM Pager   If 7PM-7AM, please contact night-coverage [email or web address] Password [email or web address]

## 2021-04-17 NOTE — Care Management Important Message (Signed)
Medicare IM printed for W/L Social Work to give to the patient. 

## 2021-04-17 NOTE — Progress Notes (Signed)
Physical Therapy Treatment Patient Details Name: Mitchell King MRN: [id number] DOB: 1951/05/31 Today's Date: 04/17/2021   History of Present Illness 70 y.o. male admitted 04/13/21 for acute CHF exacerbation.  PMH significant for CHF, hepatitis C, COPD, diabetes, HTN, history of PE.  Pt on 2 L O2 at baseline.    PT Comments    Pt ambulated in hallway and requiring 3L O2 [place] today.  HR 129 bpm during ambulation as well.  Pt anticipates d/c home today.  SATURATION QUALIFICATIONS: (This note is used to comply with regulatory documentation for home oxygen)  Patient Saturations on 2 Liters of oxygen bwhile Ambulating = 84%  Patient Saturations on 3 Liters of oxygen while Ambulating = 91%  Please briefly explain why patient needs home oxygen: to improve oxygen saturations during physical activities such as ambulation.   Recommendations for follow up therapy are one component of a multi-disciplinary discharge planning process, led by the attending physician.  Recommendations may be updated based on patient status, additional functional criteria and insurance authorization.  Follow Up Recommendations  Home health PT     Assistance Recommended at Discharge Intermittent Supervision/Assistance  Equipment Recommendations  None recommended by PT    Recommendations for Other Services       Precautions / Restrictions Precautions Precautions: Fall Precaution Comments: monitor sats     Mobility  Bed Mobility Overal bed mobility: Modified Independent                  Transfers Overall transfer level: Needs assistance Equipment used: Rolling walker (2 wheels) Transfers: Sit to/from Stand Sit to Stand: Min guard           General transfer comment: min/guard for safety    Ambulation/Gait Ambulation/Gait assistance: Min guard Gait Distance (Feet): 360 Feet Assistive device: Rolling walker (2 wheels) Gait Pattern/deviations: Step-through pattern;Decreased stride length      General Gait Details: steady with RW, required 3L O2 [place] for SPO2 91%, pt with tremulous UEs when not holding RW and reports likely due to "nerves"   Stairs             Wheelchair Mobility    Modified Rankin (Stroke Patients Only)       Balance                                            Cognition Arousal/Alertness: Awake/alert Behavior During Therapy: WFL for tasks assessed/performed Overall Cognitive Status: Within Functional Limits for tasks assessed                                          Exercises      General Comments        Pertinent Vitals/Pain Pain Assessment: No/denies pain    Home Living                          Prior Function            PT Goals (current goals can now be found in the care plan section) Progress towards PT goals: Progressing toward goals    Frequency    Min 3X/week      PT Plan Current plan remains appropriate    Co-evaluation  AM-PAC PT "6 Clicks" Mobility   Outcome Measure  Help needed turning from your back to your side while in a flat bed without using bedrails?: A Little Help needed moving from lying on your back to sitting on the side of a flat bed without using bedrails?: A Little Help needed moving to and from a bed to a chair (including a wheelchair)?: A Little Help needed standing up from a chair using your arms (e.g., wheelchair or bedside chair)?: A Little Help needed to walk in hospital room?: A Little Help needed climbing 3-5 steps with a railing? : A Little 6 Click Score: 18    End of Session Equipment Utilized During Treatment: Gait belt Activity Tolerance: Patient tolerated treatment well Patient left: in bed;with call bell/phone within reach   PT Visit Diagnosis: Difficulty in walking, not elsewhere classified (R26.2)     Time: 9675-9163 PT Time Calculation (min) (ACUTE ONLY): 14 min  Charges:  $Gait Training: 8-22  mins                    Paulino Door, DPT Acute Rehabilitation Services Pager: [phone] Office: [phone]    Janan Halter [place] 04/17/2021, 2:12 PM

## 2021-04-17 NOTE — Progress Notes (Signed)
Pt taken outside via w/c to meet family member. NAD noted.

## 2021-04-17 NOTE — Progress Notes (Signed)
At 2340 Pt stated "a funny feeling came over" him.  Vitals taken and EKG performed (please see pt chart). Provider notified. Pt heart rate was in 110's to 130's.  Pt denied pain and said the feeling was different than that of anxiety.    As of 0330, pt resting, denied further symptoms.  HR in 80's.    Will continue to monitor.

## 2021-04-17 NOTE — Progress Notes (Incomplete)
1630- Discharged home

## 2021-04-17 NOTE — Discharge Instructions (Signed)
For you heart failure: - follow up with your primary doctor at earliest visit to discuss getting a cardiologist and a pulmonologist. - weigh yourself daily- if your weight is over 216lbs, take a torsemide. If you need it more than 2 days in a row, call your doctor to inform them. Your "dry weight" is 214 lbs.  - take farxiga daily - take metoprolol twice per day  For your anemia: - take iron supplement daily. Can cause constipation so also take fiber supplement to prevent.

## 2021-04-18 LAB — CULTURE, BLOOD (ROUTINE X 2)
Culture: NO GROWTH
Culture: NO GROWTH
Special Requests: ADEQUATE

## 2021-04-19 DIAGNOSIS — J449 Chronic obstructive pulmonary disease, unspecified: Secondary | ICD-10-CM | POA: Diagnosis not present

## 2021-04-20 DIAGNOSIS — I110 Hypertensive heart disease with heart failure: Secondary | ICD-10-CM | POA: Diagnosis not present

## 2021-04-20 DIAGNOSIS — I50810 Right heart failure, unspecified: Secondary | ICD-10-CM | POA: Diagnosis not present

## 2021-04-20 DIAGNOSIS — J449 Chronic obstructive pulmonary disease, unspecified: Secondary | ICD-10-CM | POA: Diagnosis not present

## 2021-04-20 DIAGNOSIS — E119 Type 2 diabetes mellitus without complications: Secondary | ICD-10-CM | POA: Diagnosis not present

## 2021-04-20 DIAGNOSIS — J9600 Acute respiratory failure, unspecified whether with hypoxia or hypercapnia: Secondary | ICD-10-CM

## 2021-04-20 DIAGNOSIS — Z86711 Personal history of pulmonary embolism: Secondary | ICD-10-CM | POA: Diagnosis not present

## 2021-04-20 DIAGNOSIS — E440 Moderate protein-calorie malnutrition: Secondary | ICD-10-CM

## 2021-04-20 DIAGNOSIS — Z8619 Personal history of other infectious and parasitic diseases: Secondary | ICD-10-CM | POA: Diagnosis not present

## 2021-04-20 DIAGNOSIS — D649 Anemia, unspecified: Secondary | ICD-10-CM

## 2021-04-20 DIAGNOSIS — E272 Addisonian crisis: Secondary | ICD-10-CM

## 2021-04-20 DIAGNOSIS — Z87891 Personal history of nicotine dependence: Secondary | ICD-10-CM | POA: Diagnosis not present

## 2021-04-21 ENCOUNTER — Telehealth: Payer: Self-pay | Admitting: *Deleted

## 2021-04-21 ENCOUNTER — Other Ambulatory Visit: Payer: Self-pay | Admitting: *Deleted

## 2021-04-21 NOTE — Telephone Encounter (Signed)
Mitchell King with [facility] called requesting verbal orders for PT 2X5weeks and 1X4weeks.   Verbal order given. Patient has an upcoming appointment.

## 2021-04-21 NOTE — Telephone Encounter (Signed)
Transition Care Management Unsuccessful Follow-up Telephone Call  Date of discharge and from where:  04/17/2021 [place]  Attempts:  1st Attempt  Reason for unsuccessful TCM follow-up call:  No answer/busy No Voicemail Set up

## 2021-04-22 NOTE — Telephone Encounter (Signed)
Transition Care Management Follow-up Telephone Call Date of discharge and from where: 04/17/2021 [place] How have you been since you were released from the hospital? better Any questions or concerns? No  Items Reviewed: Did the pt receive and understand the discharge instructions provided? Yes  Medications obtained and verified? Yes  Other? No  Any new allergies since your discharge? No  Dietary orders reviewed? Yes Do you have support at home? Yes   Home Care and Equipment/Supplies: Were home health services ordered? yes If so, what is the name of the agency? [facility]  Has the agency set up a time to come to the patient's home? yes Were any new equipment or medical supplies ordered?  No What is the name of the medical supply agency? [facility] Were you able to get the supplies/equipment? not applicable Do you have any questions related to the use of the equipment or supplies? No  Functional Questionnaire: (I = Independent and D = Dependent) ADLs: I with assistance  Bathing/Dressing- I  Meal Prep- I  Eating- I  Maintaining continence- I  Transferring/Ambulation- I with assistance walker  Managing Meds- I  Follow up appointments reviewed:  PCP Hospital f/u appt confirmed? Yes  Scheduled to see Amy on 04/23/2021  Specialist Hospital f/u appt confirmed? No  Are transportation arrangements needed? No  If their condition worsens, is the pt aware to call PCP or go to the Emergency Dept.? Yes Was the patient provided with contact information for the PCP's office or ED? Yes Was to pt encouraged to call back with questions or concerns? Yes

## 2021-04-23 ENCOUNTER — Encounter: Payer: Self-pay | Admitting: Orthopedic Surgery

## 2021-04-23 ENCOUNTER — Other Ambulatory Visit: Payer: Self-pay

## 2021-04-23 ENCOUNTER — Ambulatory Visit (INDEPENDENT_AMBULATORY_CARE_PROVIDER_SITE_OTHER): Payer: Medicare Other | Admitting: Orthopedic Surgery

## 2021-04-23 VITALS — BP 118/62 | HR 95 | Temp 98.6°F | Ht 73.0 in | Wt 211.0 lb

## 2021-04-23 DIAGNOSIS — I700 Atherosclerosis of aorta: Secondary | ICD-10-CM | POA: Diagnosis not present

## 2021-04-23 DIAGNOSIS — E119 Type 2 diabetes mellitus without complications: Secondary | ICD-10-CM

## 2021-04-23 DIAGNOSIS — D509 Iron deficiency anemia, unspecified: Secondary | ICD-10-CM

## 2021-04-23 DIAGNOSIS — I5033 Acute on chronic diastolic (congestive) heart failure: Secondary | ICD-10-CM | POA: Diagnosis not present

## 2021-04-23 DIAGNOSIS — J449 Chronic obstructive pulmonary disease, unspecified: Secondary | ICD-10-CM | POA: Diagnosis not present

## 2021-04-23 DIAGNOSIS — Z86711 Personal history of pulmonary embolism: Secondary | ICD-10-CM | POA: Diagnosis not present

## 2021-04-23 DIAGNOSIS — I10 Essential (primary) hypertension: Secondary | ICD-10-CM | POA: Diagnosis not present

## 2021-04-23 NOTE — Patient Instructions (Addendum)
Pulmonary referral made  Continue to take iron daily due to low blood count  Follow up lab work in December to check blood count

## 2021-04-23 NOTE — Progress Notes (Signed)
Careteam: Patient Care Team: Octavia HeirFargo, Olivia Pavelko E, NP as PCP - General (Adult Health Nurse Practitioner)  Seen by: Hazle NordmannAmy Chablis Losh, AGNP-C  [facility]  Advanced Directive information    No Known Allergies  No chief complaint on file.    HPI: Patient is a 70 y.o. male seen today for transition of care s/p hospitalization 10/24-10/28 due to acute congestive heart failure.   Days leading up to admission he noticed his legs began to swell and was more short of breath. He was given IV Lasix and transitioned to torsemide prn for fluid overload. LVEF 60-65% 04/14/2021. Blood cultures negative. Advised to take torsemide for weight over 214 lbs. He was instructed to follow up with PCP to discuss referral to pulmonologist.   Today, he denies weight fluctuations, sob or ankle edema.Lives with son and daughter -in-law. He continues to weigh himself daily. Remains on 2 liters continuous oxygen. Follows low sodium diet. Daughter in law prepares meals. He started home health PT earlier this week, second session tomorrow. No recent falls. Ambulating with rolator.   We discussed his medications. He did not understand why he was taking iron supplement. We discussed his low blood count. Reports feeling tired at times. Denies black tarry stools or hematuria. Hgb 8.8, Iron 15, TIBC 482, Ferritin 11, Folate 7.3, vitamin B12 507 04/15/2021.   He has a history of PE earlier this year. CT chest 04/13/2021 negative for PE, Eliquis was discontinued.     Review of Systems:  Review of Systems  Constitutional:  Negative for chills, fever, malaise/fatigue and weight loss.  Respiratory:  Negative for cough, sputum production, shortness of breath and wheezing.        Oxygen use  Cardiovascular:  Negative for chest pain, orthopnea and leg swelling.  Gastrointestinal: Negative.   Genitourinary: Negative.   Musculoskeletal:  Negative for falls.  Neurological:  Negative for dizziness, weakness and  headaches.  Psychiatric/Behavioral:  Negative for depression. The patient is not nervous/anxious.    Past Medical History:  Diagnosis Date   CHF (congestive heart failure) (HCC)    COPD (chronic obstructive pulmonary disease) (HCC)    Diabetes mellitus without complication (HCC)    Dyspnea    Hypertension    Past Surgical History:  Procedure Laterality Date   NO PAST SURGERIES     Social History:   reports that he has quit smoking. His smoking use included cigarettes. He has a 60.00 pack-year smoking history. He has never used smokeless tobacco. He reports that he does not drink alcohol and does not use drugs.  Family History  Problem Relation Age of Onset   Heart attack Brother     Medications: Patient's Medications  New Prescriptions   No medications on file  Previous Medications   ACETAMINOPHEN (TYLENOL) 500 MG TABLET    Take 1,000 mg by mouth every 8 (eight) hours as needed for mild pain or headache (or headaches).   ALBUTEROL (VENTOLIN HFA) 108 (90 BASE) MCG/ACT INHALER    Inhale 2 puffs into the lungs every 4 (four) hours as needed for wheezing or shortness of breath.   DAPAGLIFLOZIN PROPANEDIOL (FARXIGA) 10 MG TABS TABLET    Take 1 tablet (10 mg total) by mouth daily.   FERROUS SULFATE 325 (65 FE) MG TABLET    Take 1 tablet (325 mg total) by mouth daily with breakfast.   IPRATROPIUM-ALBUTEROL (DUONEB) 0.5-2.5 (3) MG/3ML SOLN    Take 3 mLs by nebulization every 4 (four)  hours as needed.   METOPROLOL TARTRATE (LOPRESSOR) 25 MG TABLET    Take 1 tablet (25 mg total) by mouth 2 (two) times daily.   TIOTROPIUM BROMIDE MONOHYDRATE (SPIRIVA RESPIMAT) 2.5 MCG/ACT AERS    Inhale 1 puff into the lungs in the morning and at bedtime.   TORSEMIDE (DEMADEX) 20 MG TABLET    Take 1 tablet (20 mg total) by mouth daily as needed. For a weight over 216lbs, take a tablet of torsemide. If needing a tablet more than 2 days in a row, call your primary doctor.  Modified Medications   No  medications on file  Discontinued Medications   No medications on file    Physical Exam:  Vitals:   04/23/21 1046  BP: 118/62  Pulse: (!) 112  Temp: 98.6 F (37 C)  SpO2: 95%  Weight: 211 lb (95.7 kg)  Height: 6\' 1"  (1.854 m)   Body mass index is 27.84 kg/m. Wt Readings from Last 3 Encounters:  04/23/21 211 lb (95.7 kg)  04/17/21 213 lb 13.5 oz (97 kg)  01/22/21 214 lb (97.1 kg)    Physical Exam Vitals reviewed.  Constitutional:      General: He is not in acute distress. HENT:     Head: Normocephalic.  Eyes:     General:        Right eye: No discharge.        Left eye: No discharge.  Neck:     Vascular: No carotid bruit.  Cardiovascular:     Rate and Rhythm: Normal rate and regular rhythm.     Pulses: Normal pulses.     Heart sounds: Normal heart sounds. No murmur heard. Pulmonary:     Effort: Pulmonary effort is normal. No respiratory distress.     Breath sounds: Normal breath sounds. No wheezing or rales.  Abdominal:     General: Bowel sounds are normal. There is no distension.     Palpations: Abdomen is soft.     Tenderness: There is no abdominal tenderness.  Musculoskeletal:     Cervical back: Normal range of motion.     Right lower leg: No edema.     Left lower leg: No edema.  Lymphadenopathy:     Cervical: Cervical adenopathy present.  Skin:    General: Skin is warm and dry.     Capillary Refill: Capillary refill takes less than 2 seconds.  Neurological:     General: No focal deficit present.     Mental Status: He is alert and oriented to person, place, and time.  Psychiatric:        Mood and Affect: Mood normal.        Behavior: Behavior normal.    Labs reviewed: Basic Metabolic Panel: Recent Labs    08/06/20 0353 08/07/20 0311 08/09/20 1835 08/10/20 0546 08/10/20 1602 08/11/20 0033 09/16/20 1025 10/16/20 1511 10/23/20 1226 04/13/21 1938 04/14/21 0505 04/15/21 0443 04/16/21 0437  NA 137   < >  --  141  --    < > 140 134*   < >   --  135 133* 135  K 4.7   < >  --  3.3*  --    < > 5.0 4.7   < >  --  4.1 3.6 4.0  CL 93*   < >  --  101  --    < > 95* 99   < >  --  97* 89* 90*  CO2 34*   < >  --  31  --    < > 30* 28   < >  --  31 37* 38*  GLUCOSE 113*   < >  --  140*  --    < > 96 89   < >  --  86 99 134*  BUN 23   < >  --  48*  --    < > 24 78*   < >  --  25* 29* 31*  CREATININE 0.94   < >  --  1.18  --    < > 1.13 2.59*   < >  --  1.41* 1.38* 1.29*  CALCIUM 9.2   < >  --  8.5*  --    < > 10.1 9.9   < >  --  9.1 9.0 9.2  MG  --    < > 2.2 2.3 2.6*   < > 1.7 2.2  --  2.0  --   --   --   PHOS  --    < > 3.5 4.3 5.0*  --   --   --   --   --   --   --   --   TSH 0.858  --   --   --   --   --   --   --   --   --   --   --   --    < > = values in this interval not displayed.   Liver Function Tests: Recent Labs    04/13/21 1805 04/14/21 0505 04/15/21 0443  AST 26 30 35  ALT 21 22 21   ALKPHOS 55 56 61  BILITOT 1.1 1.0 0.9  PROT 8.2* 8.1 8.5*  ALBUMIN 3.7 3.7 3.8   No results for input(s): LIPASE, AMYLASE in the last 8760 hours. Recent Labs    08/07/20 0733 08/08/20 0321 08/12/20 0853  AMMONIA 75* 30 30   CBC: Recent Labs    10/14/20 0835 01/13/21 1026 04/13/21 1805 04/13/21 2324 04/14/21 0505 04/15/21 0443  WBC 4.3 5.7 4.4 4.4 5.1 4.4  NEUTROABS 1,789 3,563 2.7  --   --   --   HGB 15.9 12.1* 8.3* 8.3* 8.3* 8.8*  HCT 47.9 36.6* 28.1* 28.2* 28.0* 29.7*  MCV 92.5 92.0 88.6 88.1 88.6 87.6  PLT 170 264 307 306 308 341   Lipid Panel: Recent Labs    10/14/20 0835  CHOL 153  HDL 73  LDLCALC 66  TRIG 60  CHOLHDL 2.1   TSH: Recent Labs    08/06/20 0353  TSH 0.858   A1C: Lab Results  Component Value Date   HGBA1C 5.5 01/13/2021     Assessment/Plan 1. History of pulmonary embolus (PE) - recent CT chest negative for PE - Eliquis discontinued last hospitalization - Ambulatory referral to Pulmonology  2. Acute on chronic diastolic congestive heart failure (HCC) - LVEF 60-65% - exam  unremarkable today - weight 211 lbs  - cont daily weights - cont torsemide 20 mg daily prn for weight > 214 lbs - cont low sodium diet  3. Iron deficiency anemia, unspecified iron deficiency anemia type - recent hgb 8.8 - denies blood in stool, urine, or emesis- suspect GI loss - Hgb 8.8, Iron 15, TIBC 482, Ferritin 11, Folate 7.3, vitamin B12 507 04/15/2021 - cont ferrous sulfate 325 mg po daily - cbc/diff- future - FOBT- future  4. Essential hypertension - controlled - cont metoprolol  5. Type 2 diabetes mellitus  without complication, without long-term current use of insulin (HCC) - A1c 5.5 12/2020 - cont Farxiga  6. Chronic obstructive pulmonary disease, unspecified COPD type (HCC) - began using continuous oxygen earlier this year - heavy smoker in the past - CT chest suggested emphysema  - cont albuterol prn - referral to pulmonary specialist  7. Aortic atherosclerosis (HCC) - noted on CT chest 04/13/2021   Total time: 32 minutes. Greater than 50% of total time spent doing patient education on symptom and medication management regarding CHF and iron deficiency anemia.  Next appt: Visit date not found  Daniyla Pfahler Scherry Ran  [facility] [phone]

## 2021-04-24 DIAGNOSIS — E119 Type 2 diabetes mellitus without complications: Secondary | ICD-10-CM | POA: Diagnosis not present

## 2021-04-24 DIAGNOSIS — Z87891 Personal history of nicotine dependence: Secondary | ICD-10-CM | POA: Diagnosis not present

## 2021-04-24 DIAGNOSIS — I110 Hypertensive heart disease with heart failure: Secondary | ICD-10-CM | POA: Diagnosis not present

## 2021-04-24 DIAGNOSIS — J449 Chronic obstructive pulmonary disease, unspecified: Secondary | ICD-10-CM | POA: Diagnosis not present

## 2021-04-24 DIAGNOSIS — I50810 Right heart failure, unspecified: Secondary | ICD-10-CM | POA: Diagnosis not present

## 2021-04-24 DIAGNOSIS — E272 Addisonian crisis: Secondary | ICD-10-CM | POA: Diagnosis not present

## 2021-04-24 DIAGNOSIS — E440 Moderate protein-calorie malnutrition: Secondary | ICD-10-CM | POA: Diagnosis not present

## 2021-04-24 DIAGNOSIS — J9600 Acute respiratory failure, unspecified whether with hypoxia or hypercapnia: Secondary | ICD-10-CM | POA: Diagnosis not present

## 2021-04-24 DIAGNOSIS — Z8619 Personal history of other infectious and parasitic diseases: Secondary | ICD-10-CM | POA: Diagnosis not present

## 2021-04-24 DIAGNOSIS — Z86711 Personal history of pulmonary embolism: Secondary | ICD-10-CM | POA: Diagnosis not present

## 2021-04-24 DIAGNOSIS — D649 Anemia, unspecified: Secondary | ICD-10-CM | POA: Diagnosis not present

## 2021-04-27 DIAGNOSIS — E272 Addisonian crisis: Secondary | ICD-10-CM | POA: Diagnosis not present

## 2021-04-27 DIAGNOSIS — D649 Anemia, unspecified: Secondary | ICD-10-CM | POA: Diagnosis not present

## 2021-04-27 DIAGNOSIS — J9600 Acute respiratory failure, unspecified whether with hypoxia or hypercapnia: Secondary | ICD-10-CM | POA: Diagnosis not present

## 2021-04-27 DIAGNOSIS — I50810 Right heart failure, unspecified: Secondary | ICD-10-CM | POA: Diagnosis not present

## 2021-04-27 DIAGNOSIS — E119 Type 2 diabetes mellitus without complications: Secondary | ICD-10-CM | POA: Diagnosis not present

## 2021-04-27 DIAGNOSIS — Z8619 Personal history of other infectious and parasitic diseases: Secondary | ICD-10-CM | POA: Diagnosis not present

## 2021-04-27 DIAGNOSIS — E440 Moderate protein-calorie malnutrition: Secondary | ICD-10-CM | POA: Diagnosis not present

## 2021-04-27 DIAGNOSIS — Z86711 Personal history of pulmonary embolism: Secondary | ICD-10-CM | POA: Diagnosis not present

## 2021-04-27 DIAGNOSIS — Z87891 Personal history of nicotine dependence: Secondary | ICD-10-CM | POA: Diagnosis not present

## 2021-04-27 DIAGNOSIS — J449 Chronic obstructive pulmonary disease, unspecified: Secondary | ICD-10-CM | POA: Diagnosis not present

## 2021-04-27 DIAGNOSIS — I110 Hypertensive heart disease with heart failure: Secondary | ICD-10-CM | POA: Diagnosis not present

## 2021-04-30 DIAGNOSIS — I50810 Right heart failure, unspecified: Secondary | ICD-10-CM | POA: Diagnosis not present

## 2021-04-30 DIAGNOSIS — J449 Chronic obstructive pulmonary disease, unspecified: Secondary | ICD-10-CM | POA: Diagnosis not present

## 2021-04-30 DIAGNOSIS — D649 Anemia, unspecified: Secondary | ICD-10-CM | POA: Diagnosis not present

## 2021-04-30 DIAGNOSIS — E272 Addisonian crisis: Secondary | ICD-10-CM | POA: Diagnosis not present

## 2021-04-30 DIAGNOSIS — Z87891 Personal history of nicotine dependence: Secondary | ICD-10-CM | POA: Diagnosis not present

## 2021-04-30 DIAGNOSIS — Z8619 Personal history of other infectious and parasitic diseases: Secondary | ICD-10-CM | POA: Diagnosis not present

## 2021-04-30 DIAGNOSIS — I110 Hypertensive heart disease with heart failure: Secondary | ICD-10-CM | POA: Diagnosis not present

## 2021-04-30 DIAGNOSIS — E440 Moderate protein-calorie malnutrition: Secondary | ICD-10-CM | POA: Diagnosis not present

## 2021-04-30 DIAGNOSIS — E119 Type 2 diabetes mellitus without complications: Secondary | ICD-10-CM | POA: Diagnosis not present

## 2021-04-30 DIAGNOSIS — J9600 Acute respiratory failure, unspecified whether with hypoxia or hypercapnia: Secondary | ICD-10-CM | POA: Diagnosis not present

## 2021-04-30 DIAGNOSIS — Z86711 Personal history of pulmonary embolism: Secondary | ICD-10-CM | POA: Diagnosis not present

## 2021-05-05 DIAGNOSIS — E119 Type 2 diabetes mellitus without complications: Secondary | ICD-10-CM | POA: Diagnosis not present

## 2021-05-05 DIAGNOSIS — J449 Chronic obstructive pulmonary disease, unspecified: Secondary | ICD-10-CM | POA: Diagnosis not present

## 2021-05-05 DIAGNOSIS — J9600 Acute respiratory failure, unspecified whether with hypoxia or hypercapnia: Secondary | ICD-10-CM | POA: Diagnosis not present

## 2021-05-05 DIAGNOSIS — I50810 Right heart failure, unspecified: Secondary | ICD-10-CM | POA: Diagnosis not present

## 2021-05-05 DIAGNOSIS — Z8619 Personal history of other infectious and parasitic diseases: Secondary | ICD-10-CM | POA: Diagnosis not present

## 2021-05-05 DIAGNOSIS — Z86711 Personal history of pulmonary embolism: Secondary | ICD-10-CM | POA: Diagnosis not present

## 2021-05-05 DIAGNOSIS — I110 Hypertensive heart disease with heart failure: Secondary | ICD-10-CM | POA: Diagnosis not present

## 2021-05-05 DIAGNOSIS — Z87891 Personal history of nicotine dependence: Secondary | ICD-10-CM | POA: Diagnosis not present

## 2021-05-05 DIAGNOSIS — D649 Anemia, unspecified: Secondary | ICD-10-CM | POA: Diagnosis not present

## 2021-05-05 DIAGNOSIS — E440 Moderate protein-calorie malnutrition: Secondary | ICD-10-CM | POA: Diagnosis not present

## 2021-05-05 DIAGNOSIS — E272 Addisonian crisis: Secondary | ICD-10-CM | POA: Diagnosis not present

## 2021-05-07 DIAGNOSIS — E119 Type 2 diabetes mellitus without complications: Secondary | ICD-10-CM | POA: Diagnosis not present

## 2021-05-07 DIAGNOSIS — E272 Addisonian crisis: Secondary | ICD-10-CM | POA: Diagnosis not present

## 2021-05-07 DIAGNOSIS — E440 Moderate protein-calorie malnutrition: Secondary | ICD-10-CM | POA: Diagnosis not present

## 2021-05-07 DIAGNOSIS — D649 Anemia, unspecified: Secondary | ICD-10-CM | POA: Diagnosis not present

## 2021-05-07 DIAGNOSIS — J449 Chronic obstructive pulmonary disease, unspecified: Secondary | ICD-10-CM | POA: Diagnosis not present

## 2021-05-07 DIAGNOSIS — Z87891 Personal history of nicotine dependence: Secondary | ICD-10-CM | POA: Diagnosis not present

## 2021-05-07 DIAGNOSIS — I110 Hypertensive heart disease with heart failure: Secondary | ICD-10-CM | POA: Diagnosis not present

## 2021-05-07 DIAGNOSIS — J9600 Acute respiratory failure, unspecified whether with hypoxia or hypercapnia: Secondary | ICD-10-CM | POA: Diagnosis not present

## 2021-05-07 DIAGNOSIS — I50810 Right heart failure, unspecified: Secondary | ICD-10-CM | POA: Diagnosis not present

## 2021-05-07 DIAGNOSIS — Z8619 Personal history of other infectious and parasitic diseases: Secondary | ICD-10-CM | POA: Diagnosis not present

## 2021-05-07 DIAGNOSIS — Z86711 Personal history of pulmonary embolism: Secondary | ICD-10-CM | POA: Diagnosis not present

## 2021-05-12 DIAGNOSIS — E272 Addisonian crisis: Secondary | ICD-10-CM | POA: Diagnosis not present

## 2021-05-12 DIAGNOSIS — E440 Moderate protein-calorie malnutrition: Secondary | ICD-10-CM | POA: Diagnosis not present

## 2021-05-12 DIAGNOSIS — D649 Anemia, unspecified: Secondary | ICD-10-CM | POA: Diagnosis not present

## 2021-05-12 DIAGNOSIS — I50810 Right heart failure, unspecified: Secondary | ICD-10-CM | POA: Diagnosis not present

## 2021-05-12 DIAGNOSIS — Z87891 Personal history of nicotine dependence: Secondary | ICD-10-CM | POA: Diagnosis not present

## 2021-05-12 DIAGNOSIS — Z86711 Personal history of pulmonary embolism: Secondary | ICD-10-CM | POA: Diagnosis not present

## 2021-05-12 DIAGNOSIS — J449 Chronic obstructive pulmonary disease, unspecified: Secondary | ICD-10-CM | POA: Diagnosis not present

## 2021-05-12 DIAGNOSIS — Z8619 Personal history of other infectious and parasitic diseases: Secondary | ICD-10-CM | POA: Diagnosis not present

## 2021-05-12 DIAGNOSIS — E119 Type 2 diabetes mellitus without complications: Secondary | ICD-10-CM | POA: Diagnosis not present

## 2021-05-12 DIAGNOSIS — I110 Hypertensive heart disease with heart failure: Secondary | ICD-10-CM | POA: Diagnosis not present

## 2021-05-12 DIAGNOSIS — J9600 Acute respiratory failure, unspecified whether with hypoxia or hypercapnia: Secondary | ICD-10-CM | POA: Diagnosis not present

## 2021-05-15 DIAGNOSIS — Z87891 Personal history of nicotine dependence: Secondary | ICD-10-CM | POA: Diagnosis not present

## 2021-05-15 DIAGNOSIS — J449 Chronic obstructive pulmonary disease, unspecified: Secondary | ICD-10-CM | POA: Diagnosis not present

## 2021-05-15 DIAGNOSIS — I50810 Right heart failure, unspecified: Secondary | ICD-10-CM | POA: Diagnosis not present

## 2021-05-15 DIAGNOSIS — E440 Moderate protein-calorie malnutrition: Secondary | ICD-10-CM | POA: Diagnosis not present

## 2021-05-15 DIAGNOSIS — E119 Type 2 diabetes mellitus without complications: Secondary | ICD-10-CM | POA: Diagnosis not present

## 2021-05-15 DIAGNOSIS — D649 Anemia, unspecified: Secondary | ICD-10-CM | POA: Diagnosis not present

## 2021-05-15 DIAGNOSIS — Z86711 Personal history of pulmonary embolism: Secondary | ICD-10-CM | POA: Diagnosis not present

## 2021-05-15 DIAGNOSIS — I110 Hypertensive heart disease with heart failure: Secondary | ICD-10-CM | POA: Diagnosis not present

## 2021-05-15 DIAGNOSIS — E272 Addisonian crisis: Secondary | ICD-10-CM | POA: Diagnosis not present

## 2021-05-15 DIAGNOSIS — J9600 Acute respiratory failure, unspecified whether with hypoxia or hypercapnia: Secondary | ICD-10-CM | POA: Diagnosis not present

## 2021-05-15 DIAGNOSIS — Z8619 Personal history of other infectious and parasitic diseases: Secondary | ICD-10-CM | POA: Diagnosis not present

## 2021-05-19 DIAGNOSIS — Z86711 Personal history of pulmonary embolism: Secondary | ICD-10-CM | POA: Diagnosis not present

## 2021-05-19 DIAGNOSIS — E272 Addisonian crisis: Secondary | ICD-10-CM | POA: Diagnosis not present

## 2021-05-19 DIAGNOSIS — D649 Anemia, unspecified: Secondary | ICD-10-CM | POA: Diagnosis not present

## 2021-05-19 DIAGNOSIS — E119 Type 2 diabetes mellitus without complications: Secondary | ICD-10-CM | POA: Diagnosis not present

## 2021-05-19 DIAGNOSIS — J449 Chronic obstructive pulmonary disease, unspecified: Secondary | ICD-10-CM | POA: Diagnosis not present

## 2021-05-19 DIAGNOSIS — I50810 Right heart failure, unspecified: Secondary | ICD-10-CM | POA: Diagnosis not present

## 2021-05-19 DIAGNOSIS — E440 Moderate protein-calorie malnutrition: Secondary | ICD-10-CM | POA: Diagnosis not present

## 2021-05-19 DIAGNOSIS — J9600 Acute respiratory failure, unspecified whether with hypoxia or hypercapnia: Secondary | ICD-10-CM | POA: Diagnosis not present

## 2021-05-19 DIAGNOSIS — I110 Hypertensive heart disease with heart failure: Secondary | ICD-10-CM | POA: Diagnosis not present

## 2021-05-19 DIAGNOSIS — Z87891 Personal history of nicotine dependence: Secondary | ICD-10-CM | POA: Diagnosis not present

## 2021-05-19 DIAGNOSIS — Z8619 Personal history of other infectious and parasitic diseases: Secondary | ICD-10-CM | POA: Diagnosis not present

## 2021-05-20 DIAGNOSIS — J449 Chronic obstructive pulmonary disease, unspecified: Secondary | ICD-10-CM | POA: Diagnosis not present

## 2021-05-21 DIAGNOSIS — I50810 Right heart failure, unspecified: Secondary | ICD-10-CM | POA: Diagnosis not present

## 2021-05-21 DIAGNOSIS — J9600 Acute respiratory failure, unspecified whether with hypoxia or hypercapnia: Secondary | ICD-10-CM | POA: Diagnosis not present

## 2021-05-21 DIAGNOSIS — J449 Chronic obstructive pulmonary disease, unspecified: Secondary | ICD-10-CM | POA: Diagnosis not present

## 2021-05-21 DIAGNOSIS — D649 Anemia, unspecified: Secondary | ICD-10-CM | POA: Diagnosis not present

## 2021-05-21 DIAGNOSIS — Z8619 Personal history of other infectious and parasitic diseases: Secondary | ICD-10-CM | POA: Diagnosis not present

## 2021-05-21 DIAGNOSIS — E119 Type 2 diabetes mellitus without complications: Secondary | ICD-10-CM | POA: Diagnosis not present

## 2021-05-21 DIAGNOSIS — E272 Addisonian crisis: Secondary | ICD-10-CM | POA: Diagnosis not present

## 2021-05-21 DIAGNOSIS — I110 Hypertensive heart disease with heart failure: Secondary | ICD-10-CM | POA: Diagnosis not present

## 2021-05-21 DIAGNOSIS — Z86711 Personal history of pulmonary embolism: Secondary | ICD-10-CM | POA: Diagnosis not present

## 2021-05-21 DIAGNOSIS — E440 Moderate protein-calorie malnutrition: Secondary | ICD-10-CM | POA: Diagnosis not present

## 2021-05-21 DIAGNOSIS — Z87891 Personal history of nicotine dependence: Secondary | ICD-10-CM | POA: Diagnosis not present

## 2021-05-26 ENCOUNTER — Other Ambulatory Visit: Payer: Medicare Other

## 2021-05-26 ENCOUNTER — Other Ambulatory Visit: Payer: Self-pay

## 2021-05-26 DIAGNOSIS — I10 Essential (primary) hypertension: Secondary | ICD-10-CM

## 2021-05-26 DIAGNOSIS — I700 Atherosclerosis of aorta: Secondary | ICD-10-CM | POA: Diagnosis not present

## 2021-05-26 DIAGNOSIS — E119 Type 2 diabetes mellitus without complications: Secondary | ICD-10-CM | POA: Diagnosis not present

## 2021-05-27 LAB — COMPREHENSIVE METABOLIC PANEL
AG Ratio: 1 (calc) (ref 1.0–2.5)
ALT: 22 U/L (ref 9–46)
AST: 19 U/L (ref 10–35)
Albumin: 4.5 g/dL (ref 3.6–5.1)
Alkaline phosphatase (APISO): 71 U/L (ref 35–144)
BUN/Creatinine Ratio: 28 (calc) — ABNORMAL HIGH (ref 6–22)
BUN: 58 mg/dL — ABNORMAL HIGH (ref 7–25)
CO2: 31 mmol/L (ref 20–32)
Calcium: 10 mg/dL (ref 8.6–10.3)
Chloride: 98 mmol/L (ref 98–110)
Creat: 2.07 mg/dL — ABNORMAL HIGH (ref 0.70–1.28)
Globulin: 4.5 g/dL (calc) — ABNORMAL HIGH (ref 1.9–3.7)
Glucose, Bld: 111 mg/dL — ABNORMAL HIGH (ref 65–99)
Potassium: 4.7 mmol/L (ref 3.5–5.3)
Sodium: 136 mmol/L (ref 135–146)
Total Bilirubin: 0.5 mg/dL (ref 0.2–1.2)
Total Protein: 9 g/dL — ABNORMAL HIGH (ref 6.1–8.1)

## 2021-05-27 LAB — CBC WITH DIFFERENTIAL/PLATELET
Absolute Monocytes: 785 cells/uL (ref 200–950)
Basophils Absolute: 52 cells/uL (ref 0–200)
Basophils Relative: 1.1 %
Eosinophils Absolute: 169 cells/uL (ref 15–500)
Eosinophils Relative: 3.6 %
HCT: 41 % (ref 38.5–50.0)
Hemoglobin: 12.8 g/dL — ABNORMAL LOW (ref 13.2–17.1)
Lymphs Abs: 1603 cells/uL (ref 850–3900)
MCH: 26.7 pg — ABNORMAL LOW (ref 27.0–33.0)
MCHC: 31.2 g/dL — ABNORMAL LOW (ref 32.0–36.0)
MCV: 85.6 fL (ref 80.0–100.0)
MPV: 10.9 fL (ref 7.5–12.5)
Monocytes Relative: 16.7 %
Neutro Abs: 2092 cells/uL (ref 1500–7800)
Neutrophils Relative %: 44.5 %
Platelets: 251 10*3/uL (ref 140–400)
RBC: 4.79 10*6/uL (ref 4.20–5.80)
RDW: 16.2 % — ABNORMAL HIGH (ref 11.0–15.0)
Total Lymphocyte: 34.1 %
WBC: 4.7 10*3/uL (ref 3.8–10.8)

## 2021-05-27 LAB — HEMOGLOBIN A1C
Hgb A1c MFr Bld: 6.1 % of total Hgb — ABNORMAL HIGH (ref <5.7)
Mean Plasma Glucose: 128 mg/dL
eAG (mmol/L): 7.1 mmol/L

## 2021-05-27 LAB — LIPID PANEL
Cholesterol: 123 mg/dL (ref <200)
HDL: 53 mg/dL (ref >=40)
LDL Cholesterol (Calc): 56 mg/dL (calc)
Non-HDL Cholesterol (Calc): 70 mg/dL (calc) (ref <130)
Total CHOL/HDL Ratio: 2.3 (calc) (ref <5.0)
Triglycerides: 61 mg/dL (ref <150)

## 2021-05-28 ENCOUNTER — Other Ambulatory Visit: Payer: Self-pay

## 2021-05-28 ENCOUNTER — Ambulatory Visit (INDEPENDENT_AMBULATORY_CARE_PROVIDER_SITE_OTHER): Payer: Medicare Other | Admitting: Orthopedic Surgery

## 2021-05-28 ENCOUNTER — Ambulatory Visit: Payer: Medicare Other | Admitting: Orthopedic Surgery

## 2021-05-28 ENCOUNTER — Encounter: Payer: Self-pay | Admitting: Orthopedic Surgery

## 2021-05-28 VITALS — BP 126/84 | HR 101 | Temp 97.5°F | Ht 73.0 in | Wt 214.4 lb

## 2021-05-28 DIAGNOSIS — E440 Moderate protein-calorie malnutrition: Secondary | ICD-10-CM | POA: Diagnosis not present

## 2021-05-28 DIAGNOSIS — R7989 Other specified abnormal findings of blood chemistry: Secondary | ICD-10-CM

## 2021-05-28 DIAGNOSIS — I5032 Chronic diastolic (congestive) heart failure: Secondary | ICD-10-CM

## 2021-05-28 DIAGNOSIS — I50810 Right heart failure, unspecified: Secondary | ICD-10-CM | POA: Diagnosis not present

## 2021-05-28 DIAGNOSIS — J449 Chronic obstructive pulmonary disease, unspecified: Secondary | ICD-10-CM | POA: Diagnosis not present

## 2021-05-28 DIAGNOSIS — Z86711 Personal history of pulmonary embolism: Secondary | ICD-10-CM | POA: Diagnosis not present

## 2021-05-28 DIAGNOSIS — E119 Type 2 diabetes mellitus without complications: Secondary | ICD-10-CM

## 2021-05-28 DIAGNOSIS — D649 Anemia, unspecified: Secondary | ICD-10-CM | POA: Diagnosis not present

## 2021-05-28 DIAGNOSIS — I110 Hypertensive heart disease with heart failure: Secondary | ICD-10-CM | POA: Diagnosis not present

## 2021-05-28 DIAGNOSIS — J9600 Acute respiratory failure, unspecified whether with hypoxia or hypercapnia: Secondary | ICD-10-CM | POA: Diagnosis not present

## 2021-05-28 DIAGNOSIS — Z87891 Personal history of nicotine dependence: Secondary | ICD-10-CM | POA: Diagnosis not present

## 2021-05-28 DIAGNOSIS — E272 Addisonian crisis: Secondary | ICD-10-CM | POA: Diagnosis not present

## 2021-05-28 DIAGNOSIS — Z8619 Personal history of other infectious and parasitic diseases: Secondary | ICD-10-CM | POA: Diagnosis not present

## 2021-05-28 MED ORDER — DAPAGLIFLOZIN PROPANEDIOL 10 MG PO TABS: 10.0000 mg | ORAL_TABLET | Freq: Every day | ORAL | 1 refills | Status: DC

## 2021-05-28 NOTE — Patient Instructions (Signed)
Weight yourself daily every morning  If you weight MORE THAN 216 LBS- take one torsemide  If you take torsemide 3 days in a row- please contact doctor  Continue low sodium diet  Please have lab visit for 1-2 weeks  Hydrate with water

## 2021-05-28 NOTE — Progress Notes (Signed)
Careteam: Patient Care Team: Mitchell King, Robley Matassa E, NP as PCP - General (Adult Health Nurse Practitioner)  Seen by: Hazle NordmannAmy Chasyn Cinque, AGNP-C  [facility]  Advanced Directive information    No Known Allergies  Chief Complaint  Patient presents with   Medical Management of Chronic Issues    Patient presents today for a follow-up appointment.   Quality Metric Gaps    Eye exam, colonoscopy, zoster, TDAP,Flu, COVID booster. Has had the Flu, Zoster and Covid and will bring record to next appointment.      HPI: Patient is a 70 y.o. male seen today for acute visit due to elevated creatitine.   Recent labs discussed with patient. Creatinine 2.07 05/26/2021. He has been weighing himself daily. He has also been taking his torsemide everyday. Hospitalized 10/24- 10/28 for acute congestive heart failure. He was advised to take torsemide 20 mg daily prn for daily weight > 216 lbs. Continues to live with son. Following low sodium diet. Denies chest pain, sob, weight fluctuations and ankle edema. Eliquis discontinued last visit. Remains on  2 liters oxygen.   Medication education done with patient.   Confused about taking [place] or [place]. [facility] [place] but never gave him medication. He has been taking [place] ever since. [facility]- advised to follow up then . No recent hypoglycemic events.   Review of Systems:  Review of Systems  Constitutional:  Negative for chills, fever, malaise/fatigue and weight loss.  HENT:  Negative for hearing loss and sore throat.   Eyes:  Negative for blurred vision and double vision.  Respiratory:  Positive for shortness of breath. Negative for cough and wheezing.        Oxygen use  Cardiovascular:  Negative for chest pain and leg swelling.  Gastrointestinal:  Negative for abdominal pain, constipation, diarrhea, nausea and vomiting.  Genitourinary:  Negative for dysuria and hematuria.  Musculoskeletal:  Negative for  falls, joint pain and myalgias.  Skin:  Negative for itching and rash.  Neurological:  Negative for dizziness, weakness and headaches.  Psychiatric/Behavioral:  Negative for depression. The patient is not nervous/anxious.    Past Medical History:  Diagnosis Date   CHF (congestive heart failure) (HCC)    COPD (chronic obstructive pulmonary disease) (HCC)    Diabetes mellitus without complication (HCC)    Dyspnea    Hypertension    Past Surgical History:  Procedure Laterality Date   NO PAST SURGERIES     Social History:   reports that he has quit smoking. His smoking use included cigarettes. He has a 60.00 pack-year smoking history. He has never used smokeless tobacco. He reports that he does not drink alcohol and does not use drugs.  Family History  Problem Relation Age of Onset   Heart attack Brother     Medications: Patient's Medications  New Prescriptions   No medications on file  Previous Medications   ACETAMINOPHEN (TYLENOL) 500 MG TABLET    Take 1,000 mg by mouth every 8 (eight) hours as needed for mild pain or headache (or headaches).   ALBUTEROL (VENTOLIN HFA) 108 (90 BASE) MCG/ACT INHALER    Inhale 2 puffs into the lungs every 4 (four) hours as needed for wheezing or shortness of breath.   DAPAGLIFLOZIN PROPANEDIOL (FARXIGA) 10 MG TABS TABLET    Take 1 tablet (10 mg total) by mouth daily.   EMPAGLIFLOZIN (JARDIANCE) 25 MG TABS TABLET    TAKE ONE-HALF TABLET BY MOUTH  EVERY MORNING FOR DIABETES   FERROUS SULFATE 325 (65 FE) MG TABLET    Take 1 tablet (325 mg total) by mouth daily with breakfast.   IPRATROPIUM-ALBUTEROL (DUONEB) 0.5-2.5 (3) MG/3ML SOLN    Take 3 mLs by nebulization every 4 (four) hours as needed.   METOPROLOL TARTRATE (LOPRESSOR) 25 MG TABLET    Take 1 tablet (25 mg total) by mouth 2 (two) times daily.   TAMSULOSIN (FLOMAX) 0.4 MG CAPS CAPSULE    Take 1 capsule by mouth daily.   TIOTROPIUM BROMIDE MONOHYDRATE (SPIRIVA RESPIMAT) 2.5 MCG/ACT AERS    Inhale 1  puff into the lungs in the morning and at bedtime.   TORSEMIDE (DEMADEX) 20 MG TABLET    Take 1 tablet (20 mg total) by mouth daily as needed. For a weight over 216lbs, take a tablet of torsemide. If needing a tablet more than 2 days in a row, call your primary doctor.  Modified Medications   No medications on file  Discontinued Medications   No medications on file    Physical Exam:  Vitals:   05/28/21 1304  BP: 126/84  Pulse: (!) 101  Temp: (!) 97.5 F (36.4 C)  TempSrc: Skin  SpO2: 90%  Weight: 214 lb 6.4 oz (97.3 kg)  Height: 6\' 1"  (1.854 m)   Body mass index is 28.29 kg/m. Wt Readings from Last 3 Encounters:  05/28/21 214 lb 6.4 oz (97.3 kg)  04/23/21 211 lb (95.7 kg)  04/17/21 213 lb 13.5 oz (97 kg)    Physical Exam Vitals reviewed.  Constitutional:      General: He is not in acute distress. HENT:     Head: Normocephalic.  Eyes:     General:        Right eye: No discharge.        Left eye: No discharge.  Cardiovascular:     Rate and Rhythm: Normal rate and regular rhythm.     Pulses: Normal pulses.     Heart sounds: Normal heart sounds. No murmur heard. Pulmonary:     Effort: Pulmonary effort is normal. No respiratory distress.     Breath sounds: Normal breath sounds. No wheezing.     Comments: 2 liters oxygen Abdominal:     General: Bowel sounds are normal. There is no distension.     Palpations: Abdomen is soft.     Tenderness: There is no abdominal tenderness.  Musculoskeletal:     Right lower leg: No edema.     Left lower leg: No edema.  Skin:    General: Skin is warm and dry.     Capillary Refill: Capillary refill takes less than 2 seconds.  Neurological:     General: No focal deficit present.     Mental Status: He is alert and oriented to person, place, and time.  Psychiatric:        Mood and Affect: Mood normal.        Behavior: Behavior normal.    Labs reviewed: Basic Metabolic Panel: Recent Labs    08/06/20 0353 08/07/20 0311  08/09/20 1835 08/10/20 0546 08/10/20 1602 08/11/20 0033 09/16/20 1025 10/16/20 1511 10/23/20 1226 04/13/21 1938 04/14/21 0505 04/15/21 0443 04/16/21 0437 05/26/21 1001  NA 137   < >  --  141  --    < > 140 134*   < >  --    < > 133* 135 136  K 4.7   < >  --  3.3*  --    < >  5.0 4.7   < >  --    < > 3.6 4.0 4.7  CL 93*   < >  --  101  --    < > 95* 99   < >  --    < > 89* 90* 98  CO2 34*   < >  --  31  --    < > 30* 28   < >  --    < > 37* 38* 31  GLUCOSE 113*   < >  --  140*  --    < > 96 89   < >  --    < > 99 134* 111*  BUN 23   < >  --  48*  --    < > 24 78*   < >  --    < > 29* 31* 58*  CREATININE 0.94   < >  --  1.18  --    < > 1.13 2.59*   < >  --    < > 1.38* 1.29* 2.07*  CALCIUM 9.2   < >  --  8.5*  --    < > 10.1 9.9   < >  --    < > 9.0 9.2 10.0  MG  --    < > 2.2 2.3 2.6*   < > 1.7 2.2  --  2.0  --   --   --   --   PHOS  --    < > 3.5 4.3 5.0*  --   --   --   --   --   --   --   --   --   TSH 0.858  --   --   --   --   --   --   --   --   --   --   --   --   --    < > = values in this interval not displayed.   Liver Function Tests: Recent Labs    04/13/21 1805 04/14/21 0505 04/15/21 0443 05/26/21 1001  AST 26 30 35 19  ALT 21 22 21 22   ALKPHOS 55 56 61  --   BILITOT 1.1 1.0 0.9 0.5  PROT 8.2* 8.1 8.5* 9.0*  ALBUMIN 3.7 3.7 3.8  --    No results for input(s): LIPASE, AMYLASE in the last 8760 hours. Recent Labs    08/07/20 0733 08/08/20 0321 08/12/20 0853  AMMONIA 75* 30 30   CBC: Recent Labs    01/13/21 1026 04/13/21 1805 04/13/21 2324 04/14/21 0505 04/15/21 0443 05/26/21 1001  WBC 5.7 4.4   < > 5.1 4.4 4.7  NEUTROABS 3,563 2.7  --   --   --  2,092  HGB 12.1* 8.3*   < > 8.3* 8.8* 12.8*  HCT 36.6* 28.1*   < > 28.0* 29.7* 41.0  MCV 92.0 88.6   < > 88.6 87.6 85.6  PLT 264 307   < > 308 341 251   < > = values in this interval not displayed.   Lipid Panel: Recent Labs    10/14/20 0835 05/26/21 1001  CHOL 153 123  HDL 73 53  LDLCALC 66 56   TRIG 60 61  CHOLHDL 2.1 2.3   TSH: Recent Labs    08/06/20 0353  TSH 0.858   A1C: Lab Results  Component Value Date   HGBA1C 6.1 (H) 05/26/2021     Assessment/Plan  1. Type 2 diabetes mellitus without complication, without long-term current use of insulin (HCC) - A1c 6.1 05/26/2021, no hypoglycemia - cont low carb diet - [facility] Jardiance- did not give him medication?  - cont Farixga until next [place] appointment- 06/2021 - dapagliflozin propanediol (FARXIGA) 10 MG TABS tablet; Take 1 tablet (10 mg total) by mouth daily.  Dispense: 90 tablet; Refill: 1  2. Elevated serum creatinine - suspect from taking torsemide daily - Creat 2.07 [id number] - Basic Metabolic Panel; Future- 1-2 weeks  3. Chronic diastolic congestive heart failure (HCC) - no recent weight fluctuations, sob or edema - advised to take torsemide for weight > 216 lbs - advised to contact provider if he takes torsemide for 3 days - cont low sodium diet  Total time: 31 minutes. Greater than 50%of total time spent doing patient education on congestive heart failure and use of diuretic.    Next appt: Visit date not found  [place], [facility] [phone]

## 2021-05-29 MED ORDER — DAPAGLIFLOZIN PROPANEDIOL 10 MG PO TABS: 10.0000 mg | ORAL_TABLET | Freq: Every day | ORAL | 1 refills | Status: DC

## 2021-06-04 DIAGNOSIS — Z87891 Personal history of nicotine dependence: Secondary | ICD-10-CM | POA: Diagnosis not present

## 2021-06-04 DIAGNOSIS — Z8619 Personal history of other infectious and parasitic diseases: Secondary | ICD-10-CM | POA: Diagnosis not present

## 2021-06-04 DIAGNOSIS — E119 Type 2 diabetes mellitus without complications: Secondary | ICD-10-CM | POA: Diagnosis not present

## 2021-06-04 DIAGNOSIS — I50810 Right heart failure, unspecified: Secondary | ICD-10-CM | POA: Diagnosis not present

## 2021-06-04 DIAGNOSIS — D649 Anemia, unspecified: Secondary | ICD-10-CM | POA: Diagnosis not present

## 2021-06-04 DIAGNOSIS — E440 Moderate protein-calorie malnutrition: Secondary | ICD-10-CM | POA: Diagnosis not present

## 2021-06-04 DIAGNOSIS — Z86711 Personal history of pulmonary embolism: Secondary | ICD-10-CM | POA: Diagnosis not present

## 2021-06-04 DIAGNOSIS — I110 Hypertensive heart disease with heart failure: Secondary | ICD-10-CM | POA: Diagnosis not present

## 2021-06-04 DIAGNOSIS — J449 Chronic obstructive pulmonary disease, unspecified: Secondary | ICD-10-CM | POA: Diagnosis not present

## 2021-06-04 DIAGNOSIS — J9600 Acute respiratory failure, unspecified whether with hypoxia or hypercapnia: Secondary | ICD-10-CM | POA: Diagnosis not present

## 2021-06-04 DIAGNOSIS — E272 Addisonian crisis: Secondary | ICD-10-CM | POA: Diagnosis not present

## 2021-06-10 ENCOUNTER — Other Ambulatory Visit: Payer: Medicare Other

## 2021-06-17 ENCOUNTER — Other Ambulatory Visit: Payer: Medicare Other

## 2021-06-18 DIAGNOSIS — E272 Addisonian crisis: Secondary | ICD-10-CM | POA: Diagnosis not present

## 2021-06-18 DIAGNOSIS — E119 Type 2 diabetes mellitus without complications: Secondary | ICD-10-CM | POA: Diagnosis not present

## 2021-06-18 DIAGNOSIS — Z8619 Personal history of other infectious and parasitic diseases: Secondary | ICD-10-CM | POA: Diagnosis not present

## 2021-06-18 DIAGNOSIS — D649 Anemia, unspecified: Secondary | ICD-10-CM | POA: Diagnosis not present

## 2021-06-18 DIAGNOSIS — Z86711 Personal history of pulmonary embolism: Secondary | ICD-10-CM | POA: Diagnosis not present

## 2021-06-18 DIAGNOSIS — I110 Hypertensive heart disease with heart failure: Secondary | ICD-10-CM | POA: Diagnosis not present

## 2021-06-18 DIAGNOSIS — I50810 Right heart failure, unspecified: Secondary | ICD-10-CM | POA: Diagnosis not present

## 2021-06-18 DIAGNOSIS — E440 Moderate protein-calorie malnutrition: Secondary | ICD-10-CM | POA: Diagnosis not present

## 2021-06-18 DIAGNOSIS — J449 Chronic obstructive pulmonary disease, unspecified: Secondary | ICD-10-CM | POA: Diagnosis not present

## 2021-06-18 DIAGNOSIS — J9600 Acute respiratory failure, unspecified whether with hypoxia or hypercapnia: Secondary | ICD-10-CM | POA: Diagnosis not present

## 2021-06-18 DIAGNOSIS — Z87891 Personal history of nicotine dependence: Secondary | ICD-10-CM | POA: Diagnosis not present

## 2021-06-19 DIAGNOSIS — J449 Chronic obstructive pulmonary disease, unspecified: Secondary | ICD-10-CM | POA: Diagnosis not present

## 2021-06-24 ENCOUNTER — Other Ambulatory Visit: Payer: Medicare Other

## 2021-06-30 ENCOUNTER — Ambulatory Visit: Payer: Medicare Other | Admitting: Pulmonary Disease

## 2021-06-30 ENCOUNTER — Other Ambulatory Visit: Payer: Self-pay

## 2021-06-30 ENCOUNTER — Encounter: Payer: Self-pay | Admitting: Pulmonary Disease

## 2021-06-30 VITALS — BP 124/74 | HR 68 | Temp 97.8°F | Ht 73.0 in | Wt 209.0 lb

## 2021-06-30 DIAGNOSIS — J449 Chronic obstructive pulmonary disease, unspecified: Secondary | ICD-10-CM | POA: Diagnosis not present

## 2021-06-30 DIAGNOSIS — J9611 Chronic respiratory failure with hypoxia: Secondary | ICD-10-CM

## 2021-06-30 DIAGNOSIS — I509 Heart failure, unspecified: Secondary | ICD-10-CM | POA: Diagnosis not present

## 2021-06-30 MED ORDER — STIOLTO RESPIMAT 2.5-2.5 MCG/ACT IN AERS: 2.0000 | INHALATION_SPRAY | Freq: Every day | RESPIRATORY_TRACT | 0 refills | Status: AC

## 2021-06-30 NOTE — Patient Instructions (Addendum)
Start stiolto inhaler 2 puffs daily in the morning  Stop using spiriva inhaler while using the stiolto  Use duoneb nebulizer treatment or albuterol rescue inhaler every 4-6 hours as needed  We will schedule you for a sleep study at [facility]  We will schedule you for follow up with pulmonary function tests

## 2021-06-30 NOTE — Progress Notes (Signed)
Synopsis: Referred in January 2023 for pulmonary emboli by Windell Moulding, NP  Subjective:   PATIENT ID: Mitchell King DOB: February 12, 1951, MRN: [id number]   HPI  Chief Complaint  Patient presents with   Pulmonary Consult    Referred by Windell Moulding, NP. Pt states having SOB since dx with CHF Feb 2022. He was started on o2 shortly after 2lpm 24/7. He states he feels like his breathing is doing okay as long as he keeps using his o2.     Mitchell King is a 71 year old King, former smoker with CHF, COPD, DMII and HTN who is referred to pulmonary clinic for COPD and chronic respiratory failure.   Patient was started on supplemental oxygen therapy in early 2022 after one of his hospitalizations for heart failure and pneumonia. He was most recently admitted in October 2022 for acute respiratory failure due to heart failure and pulmonary edema.   He has been doing well since his admission. He continues to use 2L O2 throughout the day and night. He has significant exertional dyspnea and is hoping to get better to the point where he is more independent. He has moved in with his son over the past year. He is using spiriva 2.5 mcg 1 puff twice daily and as needed albuterol or duoneb nebulizer treatment. He estimates using the duoneb or albuterol inhaler 7-8 times daily due to dyspnea with movement. He reports sinus congestion and drainage since having tubes in his nose from being in the ICU.   He does report a concern for OSA that was mentioned to him in the past.   He quit smoking last February after his first hospital admission.   Past Medical History:  Diagnosis Date   CHF (congestive heart failure) (HCC)    COPD (chronic obstructive pulmonary disease) (HCC)    Diabetes mellitus without complication (HCC)    Dyspnea    Hypertension      Family History  Problem Relation Age of Onset   Heart attack Brother      Social History   Socioeconomic History   Marital status: Divorced     Spouse name: Not on file   Number of children: Not on file   Years of education: Not on file   Highest education level: Not on file  Occupational History   Not on file  Tobacco Use   Smoking status: Former    Packs/day: 1.50    Years: 52.00    Pack years: 78.00    Types: Cigarettes    Quit date: 07/22/2020    Years since quitting: 0.9   Smokeless tobacco: Never  Vaping Use   Vaping Use: Never used  Substance and Sexual Activity   Alcohol use: No    Comment: non x 5 years    Drug use: No   Sexual activity: Not Currently  Other Topics Concern   Not on file  Social History Narrative   Diet: Blank       Do you drink/ eat things with caffeine? No      Marital status:   Divorced                            What year were you married ? Blank      Do you live in a house, apartment,assistred living, condo, trailer, etc.)? House      Is it one or more stories? 1  How many persons live in your home ? 1      Do you have any pets in your home ?(please list) Dog      Highest Level of education completed: 12 th      Current or past profession: Blank      Do you exercise?    No                          Type & how often  Blank      ADVANCED DIRECTIVES (Please bring copies)      Do you have a living will? No      Do you have a DNR form? No                      If not, do you want to discuss one? No      Do you have signed POA?HPOA forms?  No               If so, please bring to your appointment No      FUNCTIONAL STATUS- To be completed by Spouse / child / Staff       Do you have difficulty bathing or dressing yourself ?  No        Do you have difficulty preparing food or eating ?  No      Do you have difficulty managing your mediation ?  No      Do you have difficulty managing your finances ?  No      Do you have difficulty affording your medication ?  No      Social Determinants of Radio broadcast assistant Strain: Not on file  Food Insecurity: Not on file   Transportation Needs: Not on file  Physical Activity: Not on file  Stress: Not on file  Social Connections: Not on file  Intimate Partner Violence: Not on file     No Known Allergies   Outpatient Medications Prior to Visit  Medication Sig Dispense Refill   acetaminophen (TYLENOL) 500 MG tablet Take 1,000 mg by mouth every 8 (eight) hours as needed for mild pain or headache (or headaches).     albuterol (VENTOLIN HFA) 108 (90 Base) MCG/ACT inhaler Inhale 2 puffs into the lungs every 4 (four) hours as needed for wheezing or shortness of breath. 6.7 g 0   ferrous sulfate 325 (65 FE) MG tablet Take 1 tablet (325 mg total) by mouth daily with breakfast. 30 tablet 0   ipratropium-albuterol (DUONEB) 0.5-2.5 (3) MG/3ML SOLN Take 3 mLs by nebulization every 4 (four) hours as needed. 360 mL 1   metoprolol tartrate (LOPRESSOR) 25 MG tablet Take 1 tablet (25 mg total) by mouth 2 (two) times daily. 60 tablet 0   tamsulosin (FLOMAX) 0.4 MG CAPS capsule Take 1 capsule by mouth daily.     Tiotropium Bromide Monohydrate (SPIRIVA RESPIMAT) 2.5 MCG/ACT AERS Inhale 1 puff into the lungs in the morning and at bedtime.     torsemide (DEMADEX) 20 MG tablet Take 1 tablet (20 mg total) by mouth daily as needed. For a weight over 216lbs, take a tablet of torsemide. If needing a tablet more than 2 days in a row, call your primary doctor. 30 tablet 0   dapagliflozin propanediol (FARXIGA) 10 MG TABS tablet Take 1 tablet (10 mg total) by mouth daily. 90 tablet 1   No facility-administered medications prior to visit.  Review of Systems  Constitutional:  Positive for malaise/fatigue. Negative for chills, fever and weight loss.  HENT:  Positive for congestion. Negative for sinus pain and sore throat.   Eyes: Negative.   Respiratory:  Positive for shortness of breath. Negative for cough, hemoptysis, sputum production and wheezing.   Cardiovascular:  Negative for chest pain, palpitations, orthopnea, claudication and leg  swelling.  Gastrointestinal:  Negative for abdominal pain, heartburn, nausea and vomiting.  Genitourinary: Negative.   Musculoskeletal:  Negative for joint pain and myalgias.  Skin:  Negative for rash.  Neurological:  Positive for weakness.  Endo/Heme/Allergies: Negative.   Psychiatric/Behavioral: Negative.     Objective:   Vitals:   06/30/21 1530  BP: 124/74  Pulse: 68  Temp: 97.8 F (36.6 C)  TempSrc: Oral  SpO2: 95%  Weight: 209 lb (94.8 kg)  Height: 6\' 1"  (1.854 m)   Physical Exam Constitutional:      General: He is not in acute distress. HENT:     Head: Normocephalic and atraumatic.  Eyes:     Extraocular Movements: Extraocular movements intact.     Conjunctiva/sclera: Conjunctivae normal.     Pupils: Pupils are equal, round, and reactive to light.  Cardiovascular:     Rate and Rhythm: Normal rate and regular rhythm.     Pulses: Normal pulses.     Heart sounds: Normal heart sounds. No murmur heard. Pulmonary:     Effort: Pulmonary effort is normal.     Breath sounds: Decreased air movement present. No wheezing, rhonchi or rales.  Abdominal:     General: Bowel sounds are normal.     Palpations: Abdomen is soft.  Musculoskeletal:     Right lower leg: No edema.     Left lower leg: No edema.  Lymphadenopathy:     Cervical: No cervical adenopathy.  Skin:    General: Skin is warm and dry.  Neurological:     General: No focal deficit present.     Mental Status: He is alert.  Psychiatric:        Mood and Affect: Mood normal.        Behavior: Behavior normal.        Thought Content: Thought content normal.        Judgment: Judgment normal.   CBC    Component Value Date/Time   WBC 4.7 05/26/2021 1001   RBC 4.79 05/26/2021 1001   HGB 12.8 (L) 05/26/2021 1001   HCT 41.0 05/26/2021 1001   PLT 251 05/26/2021 1001   MCV 85.6 05/26/2021 1001   MCH 26.7 (L) 05/26/2021 1001   MCHC 31.2 (L) 05/26/2021 1001   RDW 16.2 (H) 05/26/2021 1001   LYMPHSABS 1,603  05/26/2021 1001   MONOABS 0.7 04/13/2021 1805   EOSABS 169 05/26/2021 1001   BASOSABS 52 05/26/2021 1001   BMP Latest Ref Rng & Units 05/26/2021 04/16/2021 04/15/2021  Glucose 65 - 99 mg/dL 111(H) 134(H) 99  BUN 7 - 25 mg/dL 58(H) 31(H) 29(H)  Creatinine 0.70 - 1.28 mg/dL 2.07(H) 1.29(H) 1.38(H)  BUN/Creat Ratio 6 - 22 (calc) 28(H) - -  Sodium 135 - 146 mmol/L 136 135 133(L)  Potassium 3.5 - 5.3 mmol/L 4.7 4.0 3.6  Chloride 98 - 110 mmol/L 98 90(L) 89(L)  CO2 20 - 32 mmol/L 31 38(H) 37(H)  Calcium 8.6 - 10.3 mg/dL 10.0 9.2 9.0   Chest imaging: CT Chest 04/13/21 Mediastinum/Nodes: No mediastinal, hilar, or axillary adenopathy. Trachea and esophagus are unremarkable. Thyroid unremarkable.   Lungs/Pleura:  Mild to moderate centrilobular emphysema. Small right pleural effusion. Compressive atelectasis in the right lower lobe. No confluent opacities.  PFT: No flowsheet data found.  Labs:  Path:  Echo 04/14/21: LV EF 60-65%. Moderate concentric LVH. RV systolic function moderately reduced. RV size is moderately enlarged. Severely elevated PASP. LA Mildly dilated. RA mildly dilated.   Heart Catheterization:  Assessment & Plan:   Chronic obstructive pulmonary disease, unspecified COPD type ([place]) - Plan: Split night study, Pulmonary Function Test  Chronic respiratory failure with hypoxia (HCC)  Congestive heart failure, unspecified HF chronicity, unspecified heart failure type ([place]) - Plan: Split night study  Discussion: Mitchell King is a 71 year old [place], former smoker with CHF, COPD, DMII and HTN who is referred to pulmonary clinic for COPD and chronic respiratory failure.   He has centrilobular emphysema based on his recent CT Chest scan from 03/2021. He is to continue supplemental oxygen throughout the day and at night when sleeping. We will schedule him for split night sleep study for concern of obstructive sleep apnea.   We will trial him on stiolto 2 puffs daily and  stop the spiriva. He is to call our office if he notices improvement on the stiolto and we will send in a prescription. He can continue duoneb nebulizer treatments as needed or albuterol inhaler every 4-6 hours.   We will refer him to our lung cancer screening program at next visit. We will consider to referral to pulmonary rehab, he is in the [place] so remote pulm rehab could also be an option.  Follow up in 6 months with pulmonary function tests.  Freda Jackson, MD [facility] Office: [phone]   Current Outpatient Medications:    acetaminophen (TYLENOL) 500 MG tablet, Take 1,000 mg by mouth every 8 (eight) hours as needed for mild pain or headache (or headaches)., Disp: , Rfl:    albuterol (VENTOLIN HFA) 108 (90 Base) MCG/ACT inhaler, Inhale 2 puffs into the lungs every 4 (four) hours as needed for wheezing or shortness of breath., Disp: 6.7 g, Rfl: 0   ferrous sulfate 325 (65 FE) MG tablet, Take 1 tablet (325 mg total) by mouth daily with breakfast., Disp: 30 tablet, Rfl: 0   ipratropium-albuterol (DUONEB) 0.5-2.5 (3) MG/3ML SOLN, Take 3 mLs by nebulization every 4 (four) hours as needed., Disp: 360 mL, Rfl: 1   metoprolol tartrate (LOPRESSOR) 25 MG tablet, Take 1 tablet (25 mg total) by mouth 2 (two) times daily., Disp: 60 tablet, Rfl: 0   tamsulosin (FLOMAX) 0.4 MG CAPS capsule, Take 1 capsule by mouth daily., Disp: , Rfl:    Tiotropium Bromide Monohydrate (SPIRIVA RESPIMAT) 2.5 MCG/ACT AERS, Inhale 1 puff into the lungs in the morning and at bedtime., Disp: , Rfl:    Tiotropium Bromide-Olodaterol (STIOLTO RESPIMAT) 2.5-2.5 MCG/ACT AERS, Inhale 2 puffs into the lungs daily., Disp: 4 g, Rfl: 0   torsemide (DEMADEX) 20 MG tablet, Take 1 tablet (20 mg total) by mouth daily as needed. For a weight over 216lbs, take a tablet of torsemide. If needing a tablet more than 2 days in a row, call your primary doctor., Disp: 30 tablet, Rfl: 0

## 2021-07-20 DIAGNOSIS — J449 Chronic obstructive pulmonary disease, unspecified: Secondary | ICD-10-CM | POA: Diagnosis not present

## 2021-08-02 ENCOUNTER — Encounter (HOSPITAL_BASED_OUTPATIENT_CLINIC_OR_DEPARTMENT_OTHER): Payer: Medicare Other | Admitting: Pulmonary Disease

## 2021-08-18 DIAGNOSIS — J449 Chronic obstructive pulmonary disease, unspecified: Secondary | ICD-10-CM | POA: Diagnosis not present

## 2021-09-17 DIAGNOSIS — J449 Chronic obstructive pulmonary disease, unspecified: Secondary | ICD-10-CM | POA: Diagnosis not present

## 2021-10-18 DIAGNOSIS — J449 Chronic obstructive pulmonary disease, unspecified: Secondary | ICD-10-CM | POA: Diagnosis not present

## 2021-11-17 DIAGNOSIS — J449 Chronic obstructive pulmonary disease, unspecified: Secondary | ICD-10-CM | POA: Diagnosis not present

## 2021-11-27 ENCOUNTER — Telehealth: Payer: Self-pay

## 2021-11-27 NOTE — Telephone Encounter (Signed)
Tried calling patient to schedule a Medicare AWV and a follow up visit. Was unable to leave voicemail on the patients phone nor his son's.

## 2021-12-18 DIAGNOSIS — J449 Chronic obstructive pulmonary disease, unspecified: Secondary | ICD-10-CM | POA: Diagnosis not present

## 2021-12-24 IMAGING — CT CT ABD-PELV W/O CM
2 of 4 series · 17 of 46 positions shown, 19 images · non-contrast
Comparison: None similar and available. Right upper quadrant
ultrasound from 2 days ago.

CLINICAL DATA: Abdominal distension.  Acute abdominal pain

EXAM:
CT ABDOMEN AND PELVIS WITHOUT CONTRAST
TECHNIQUE: Multidetector CT imaging of the abdomen and pelvis was performed
following the standard protocol without IV contrast.

[Series 3: a/p w/o 5mm · axial · non-contrast · 0.98mm/px · z∈[+778,+1223]mm · 14 of 99 slices shown, 16 images]
[im 5/99  soft-tissue]
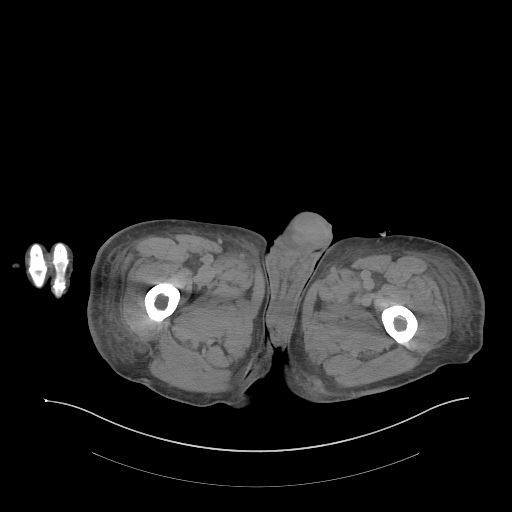
[im 5/99  bone]
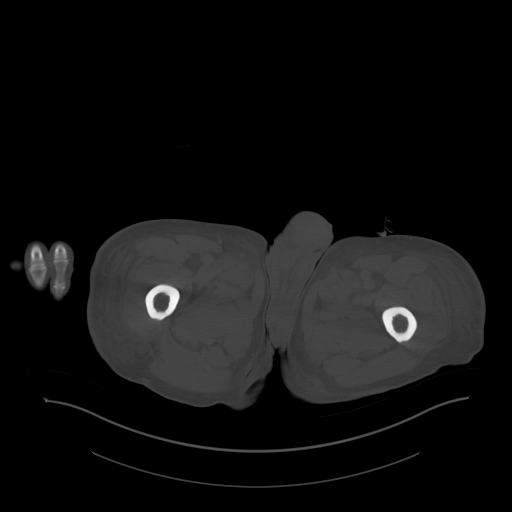
[im 13/99  soft-tissue]
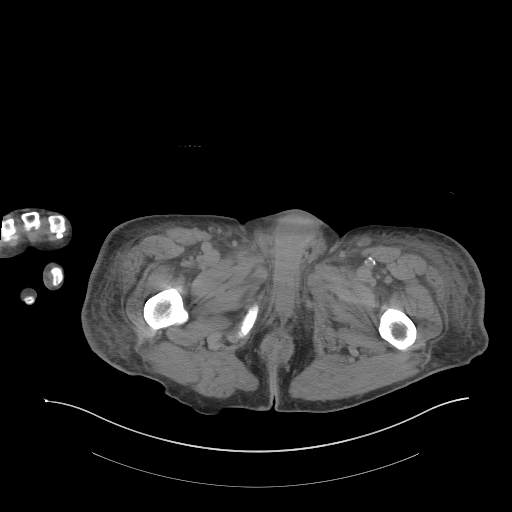
[im 18/99  soft-tissue]
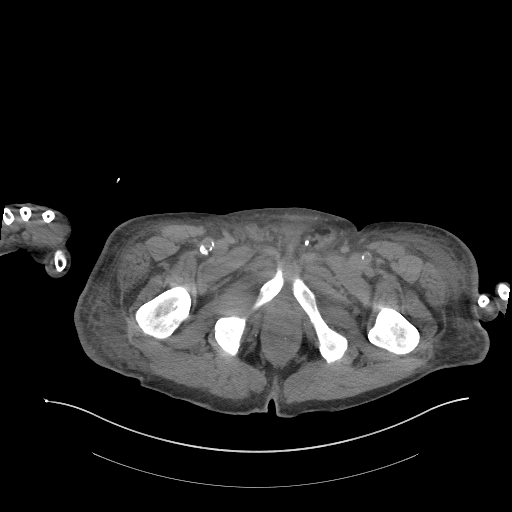
[im 26/99  soft-tissue]
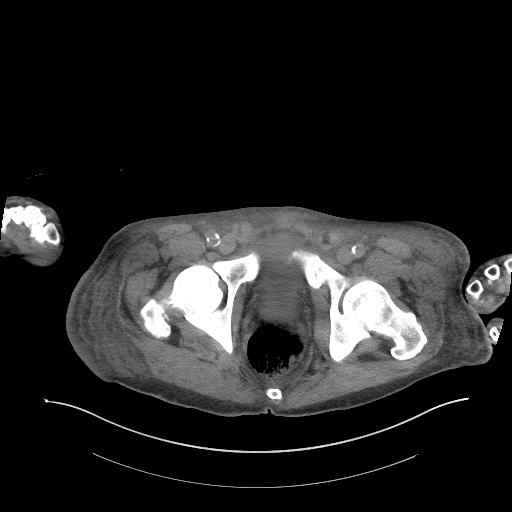
[im 35/99  soft-tissue]
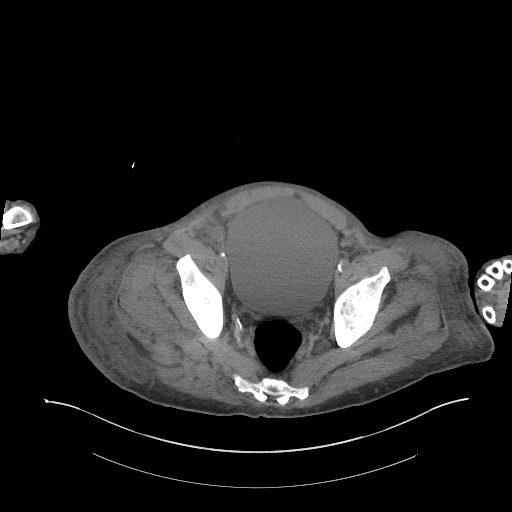
[im 39/99  soft-tissue]
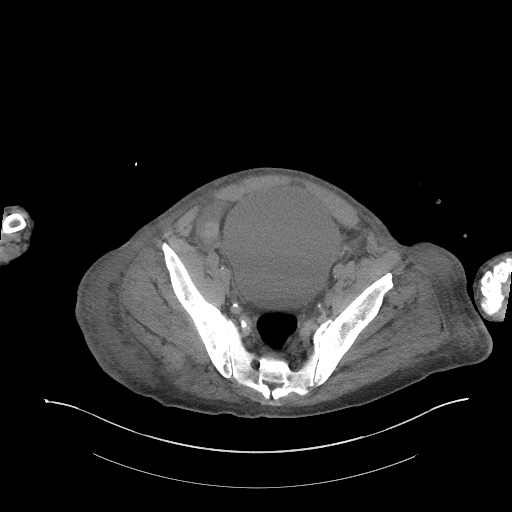
[im 47/99  soft-tissue]
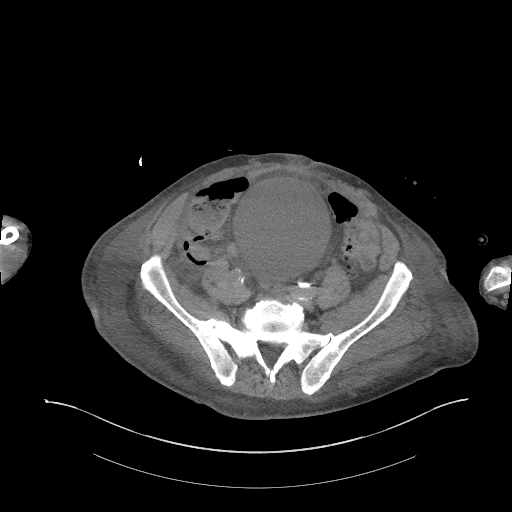
[im 52/99  soft-tissue]
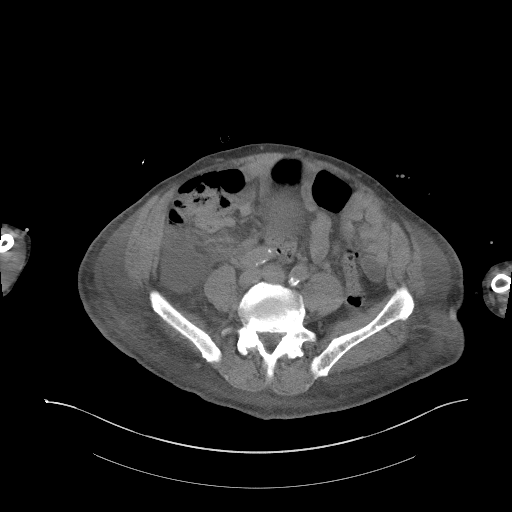
[im 60/99  soft-tissue]
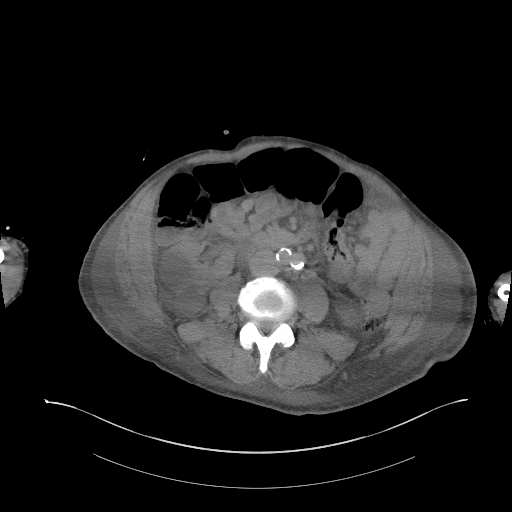
[im 60/99  bone]
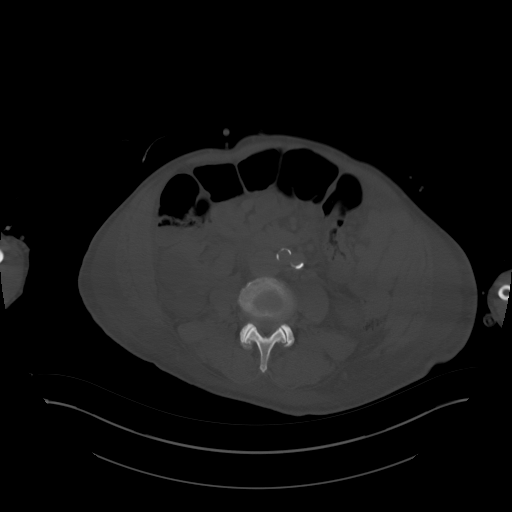
[im 64/99  soft-tissue]
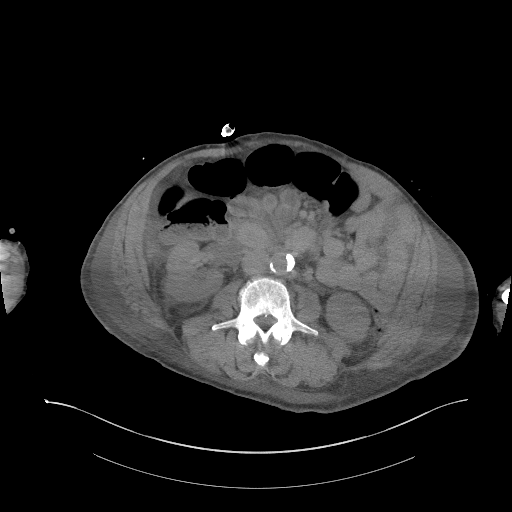
[im 73/99  soft-tissue]
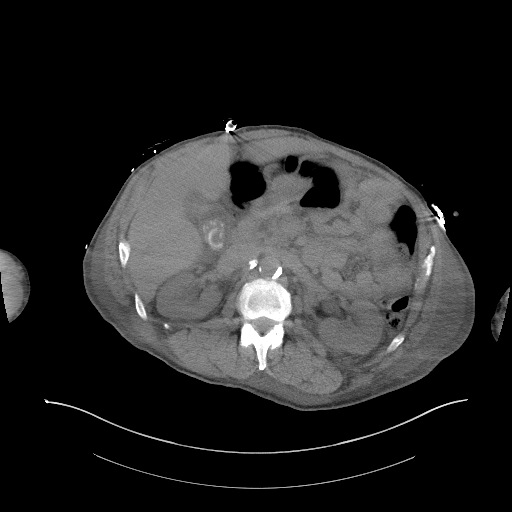
[im 81/99  soft-tissue]
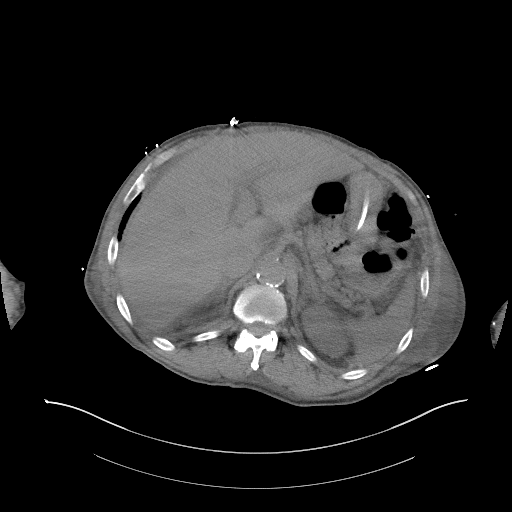
[im 86/99  soft-tissue]
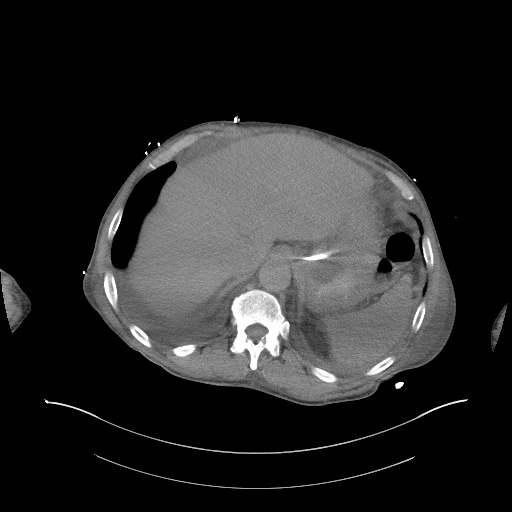
[im 94/99  soft-tissue]
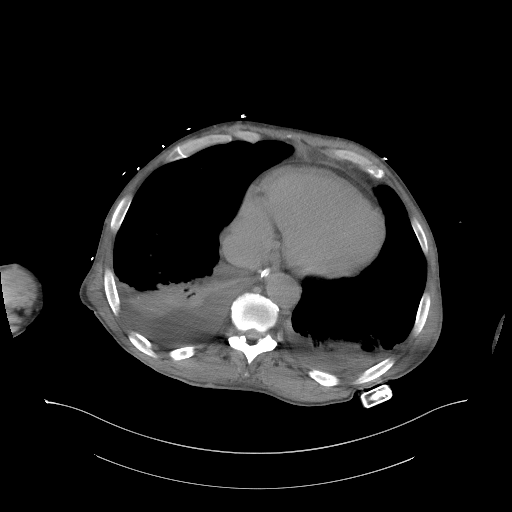

[Series 6: a/p w/o cor · coronal · non-contrast · 1.00mm/px · 3 of 169 slices shown]
[im 57/169  soft-tissue]
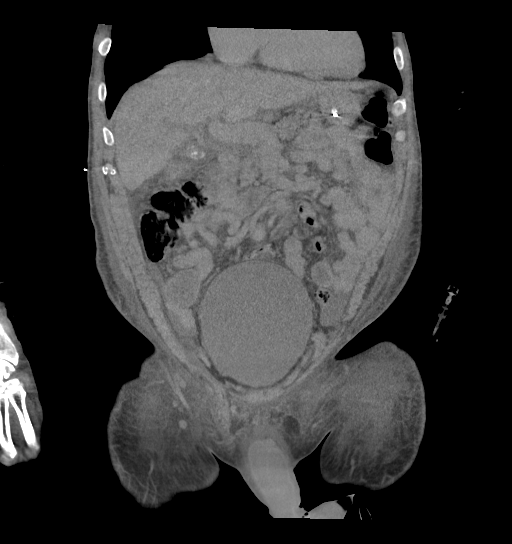
[im 75/169  soft-tissue]
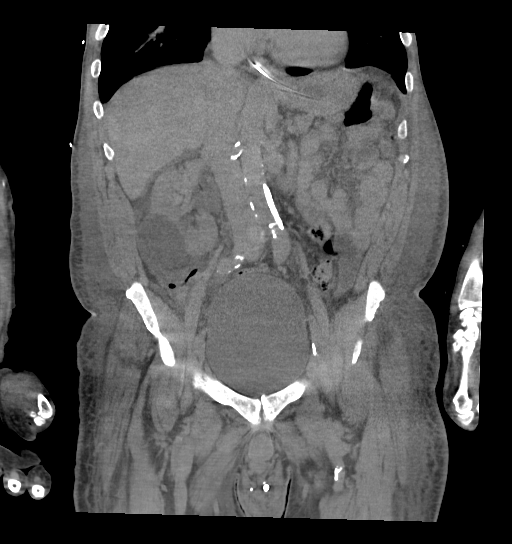
[im 94/169  soft-tissue]
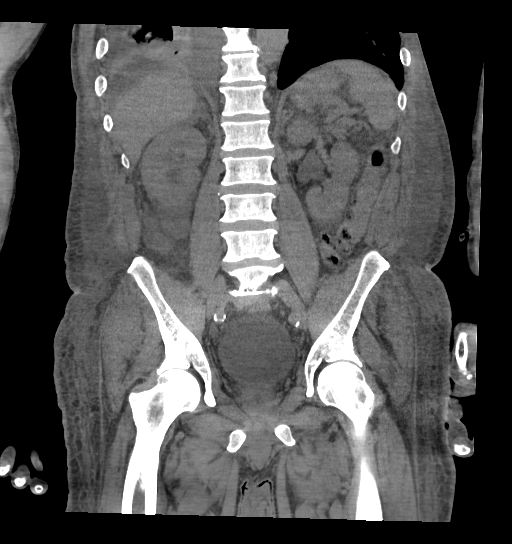

[17 of 46 positions shown; findings below may reference images not displayed]

FINDINGS: Lower chest:  Lower lobe atelectasis with small pleural effusions.

Hepatobiliary: No focal liver abnormality.Numerous calcified
gallstones. No focal pericholecystic inflammation.

Pancreas: Unremarkable.

Spleen: Unremarkable.

Adrenals/Urinary Tract: Negative adrenals. Mild bilateral
hydroureteronephrosis above a distended urinary bladder. Right renal
cysts which have a simple appearance by CT, up to 5.7 cm from the
right lower pole. Unremarkable bladder.

Stomach/Bowel: No obstruction or ileus. No visible bowel
inflammation. There is an enteric tube with tip at the mid stomach.

Vascular/Lymphatic: Extensive atheromatous calcification. No mass or
adenopathy.

Reproductive:No acute finding.

Other: No ascites or pneumoperitoneum. Anasarca. Small umbilical
hernia containing fat.

Musculoskeletal: No acute abnormalities. Diffuse facet
osteoarthritis. L4-5 anterolisthesis.
IMPRESSION: 1. Distended urinary bladder reaching the umbilicus and causing
bilateral hydroureteronephrosis.
2.  Aortic Atherosclerosis (RLOA1-AL6.6).
3. Anasarca.
4. Lower lobe atelectasis and small pleural effusions.
5. Cholelithiasis.

## 2021-12-26 IMAGING — DX DG CHEST 1V PORT
1 series · 2 of 2 positions shown · non-contrast
Comparison: Yesterday

CLINICAL DATA: Pulmonary edema

EXAM:
PORTABLE CHEST 1 VIEW

[Series 1: chest · 0.14mm/px · 2 of 2 slices shown]
[im 1/2]
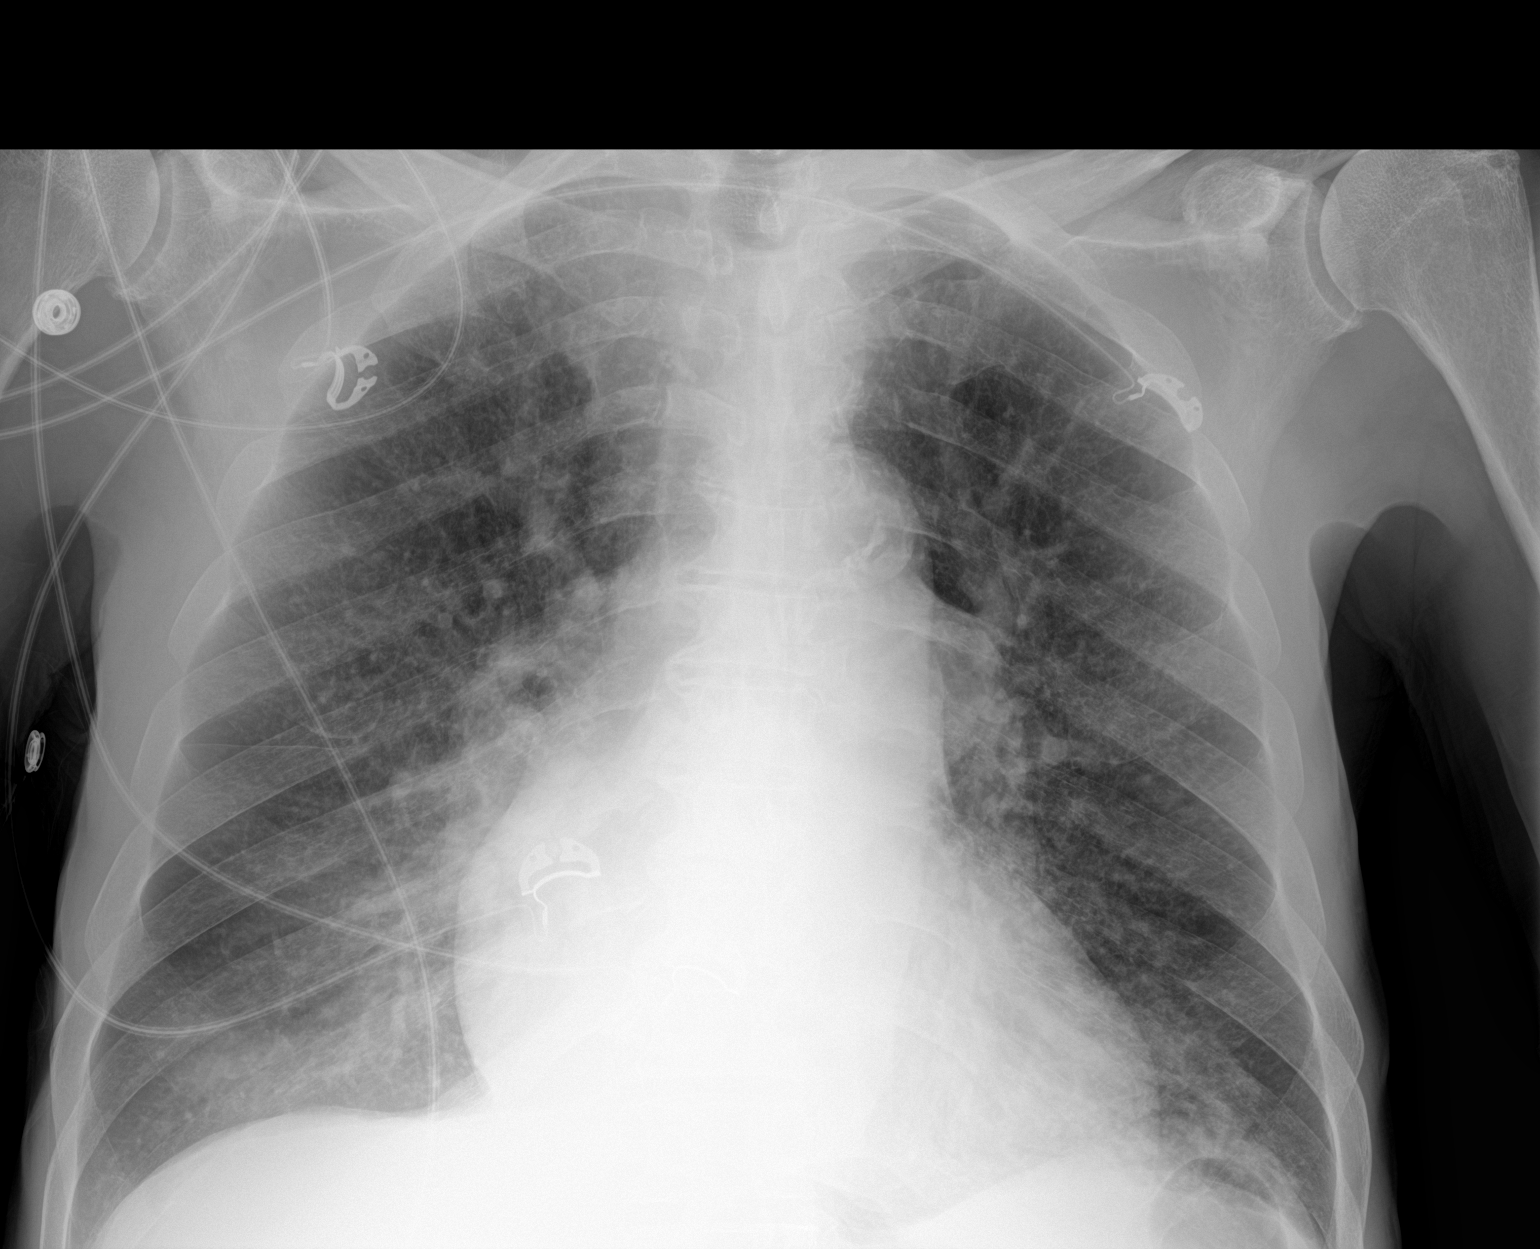
[im 2/2]
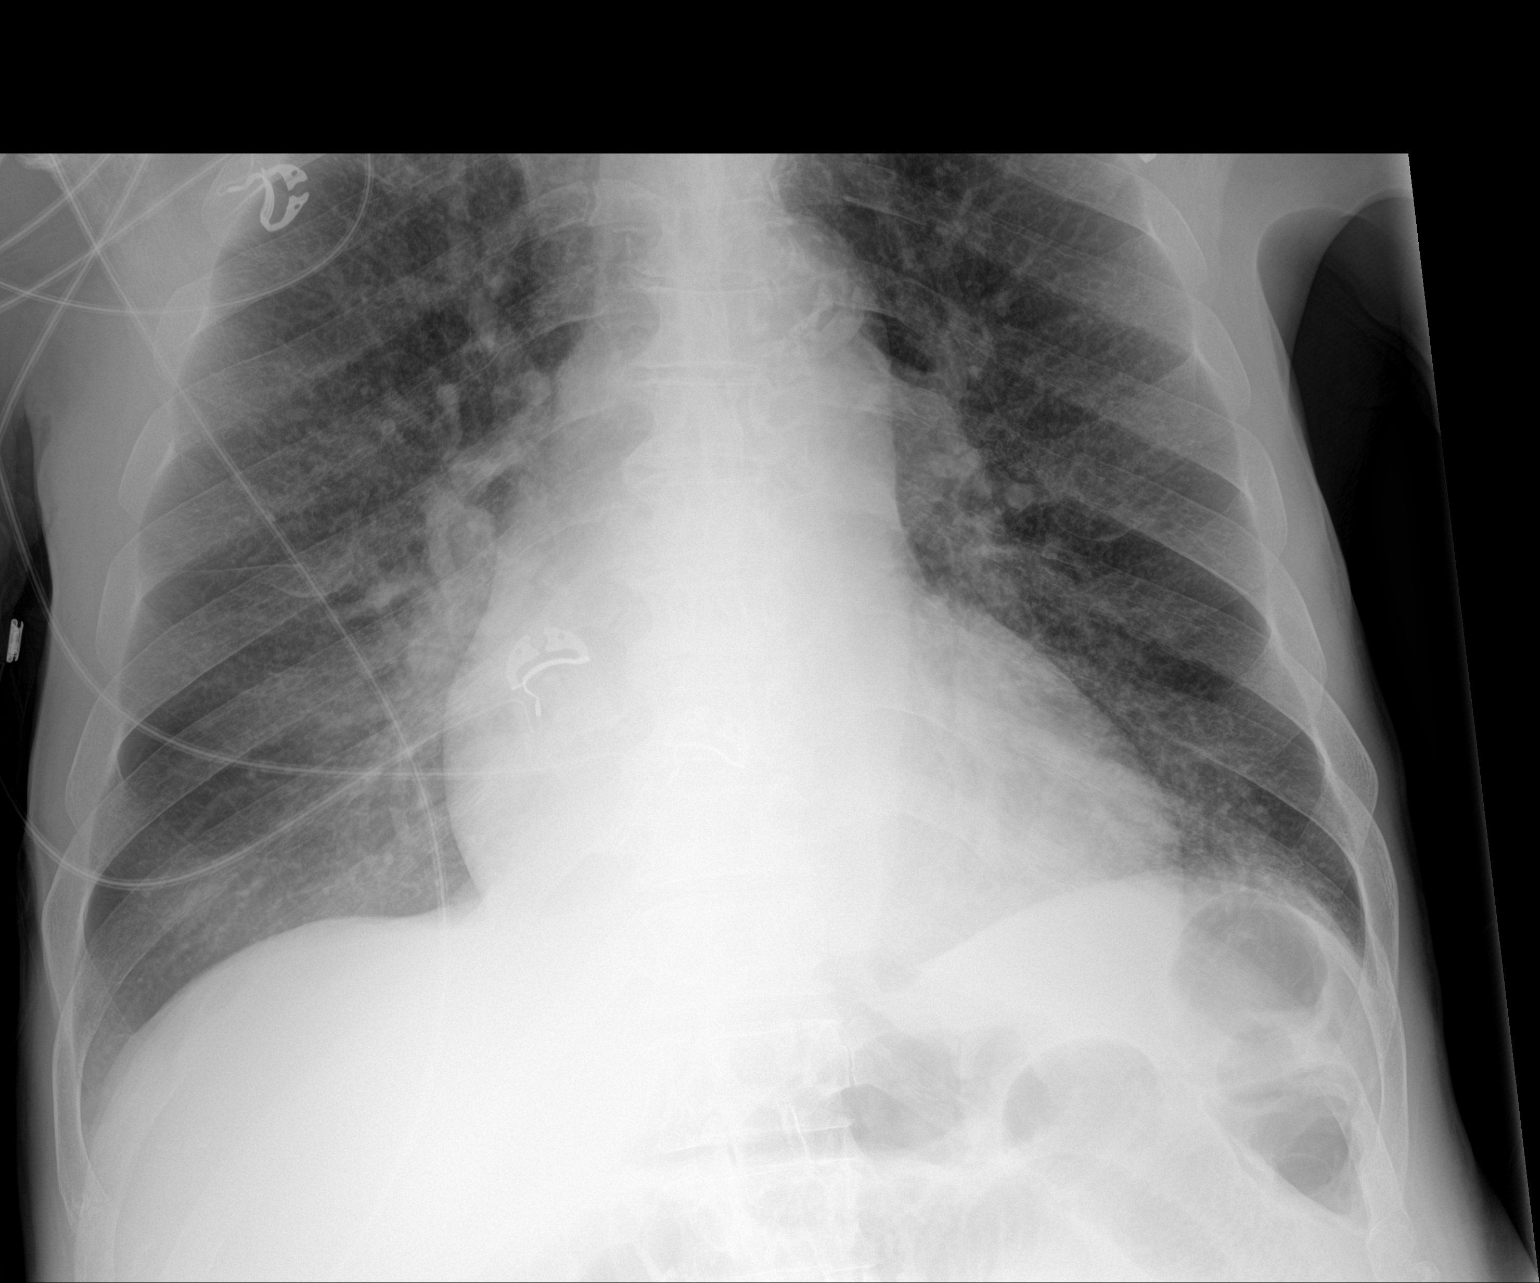

[2 of 2 positions shown; findings below may reference images not displayed]

FINDINGS: Esophageal extubation. Hazy bilateral pulmonary opacity from pleural
effusions and atelectasis by most recent CTs. Cardiomegaly. No
visible pneumothorax.
IMPRESSION: Unchanged hazy chest opacification correlating with atelectasis and
pleural fluid on recent CT.

## 2021-12-27 IMAGING — DX DG ABDOMEN 1V
1 series · 1 of 1 positions shown · non-contrast
Comparison: 08/11/2020.

CLINICAL DATA: Ileus.

EXAM:
ABDOMEN - 1 VIEW

[abdomen]
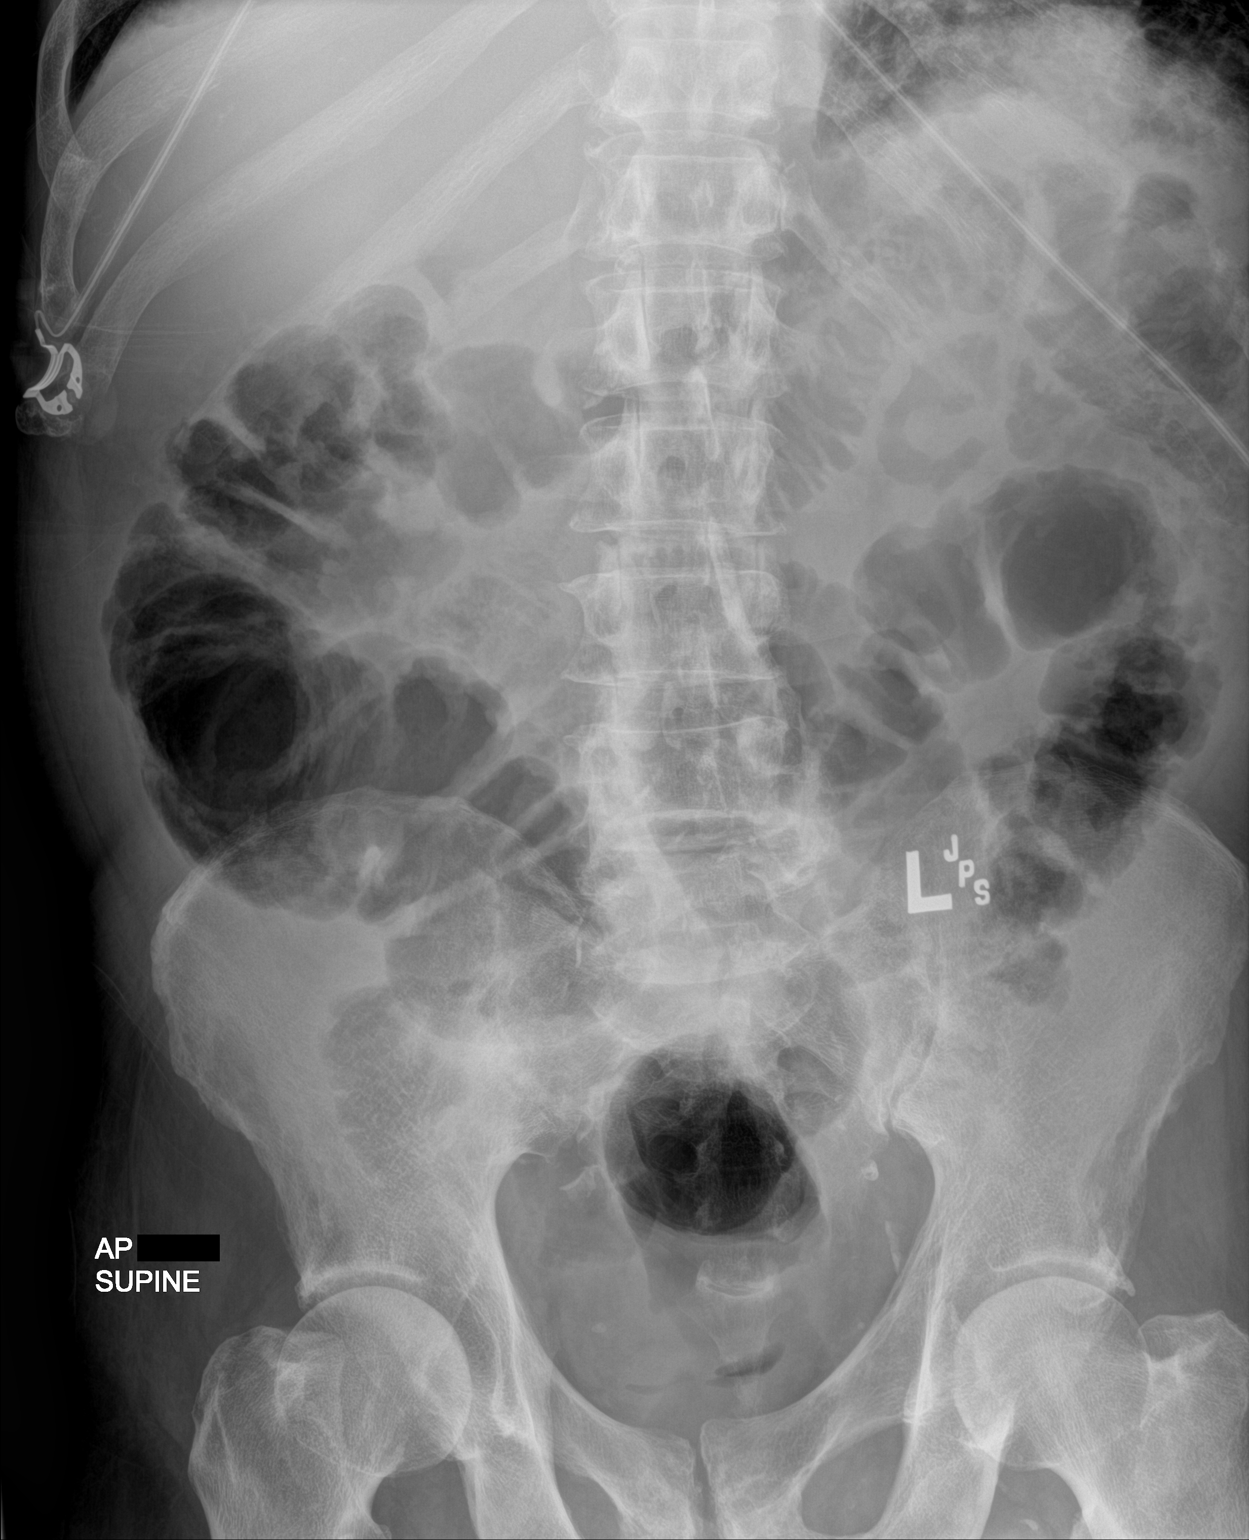

[1 of 1 positions shown; findings below may reference images not displayed]

FINDINGS: NG tube has been removed. Persistent but improved slightly dilated
loops of small bowel noted. Colonic gas pattern is normal. No free
air identified. Degenerative changes lumbar spine and both hips.
Aortoiliac atherosclerotic vascular calcification. Pelvic
calcifications consistent phleboliths. Left base
atelectasis/infiltrate.
IMPRESSION: 1. NG tube has been removed. Persistent but improved slightly
dilated loops of small bowel noted. Colonic gas pattern is normal.
2. Left base atelectasis/infiltrate.

## 2021-12-28 IMAGING — DX DG CHEST 1V PORT
2 series · 2 of 2 positions shown · non-contrast
Comparison: Chest x-ray 08/11/2020, 08/08/2020.

CLINICAL DATA: Shortness of breath.

EXAM:
PORTABLE CHEST 1 VIEW

[chest ap (1 of 2)]
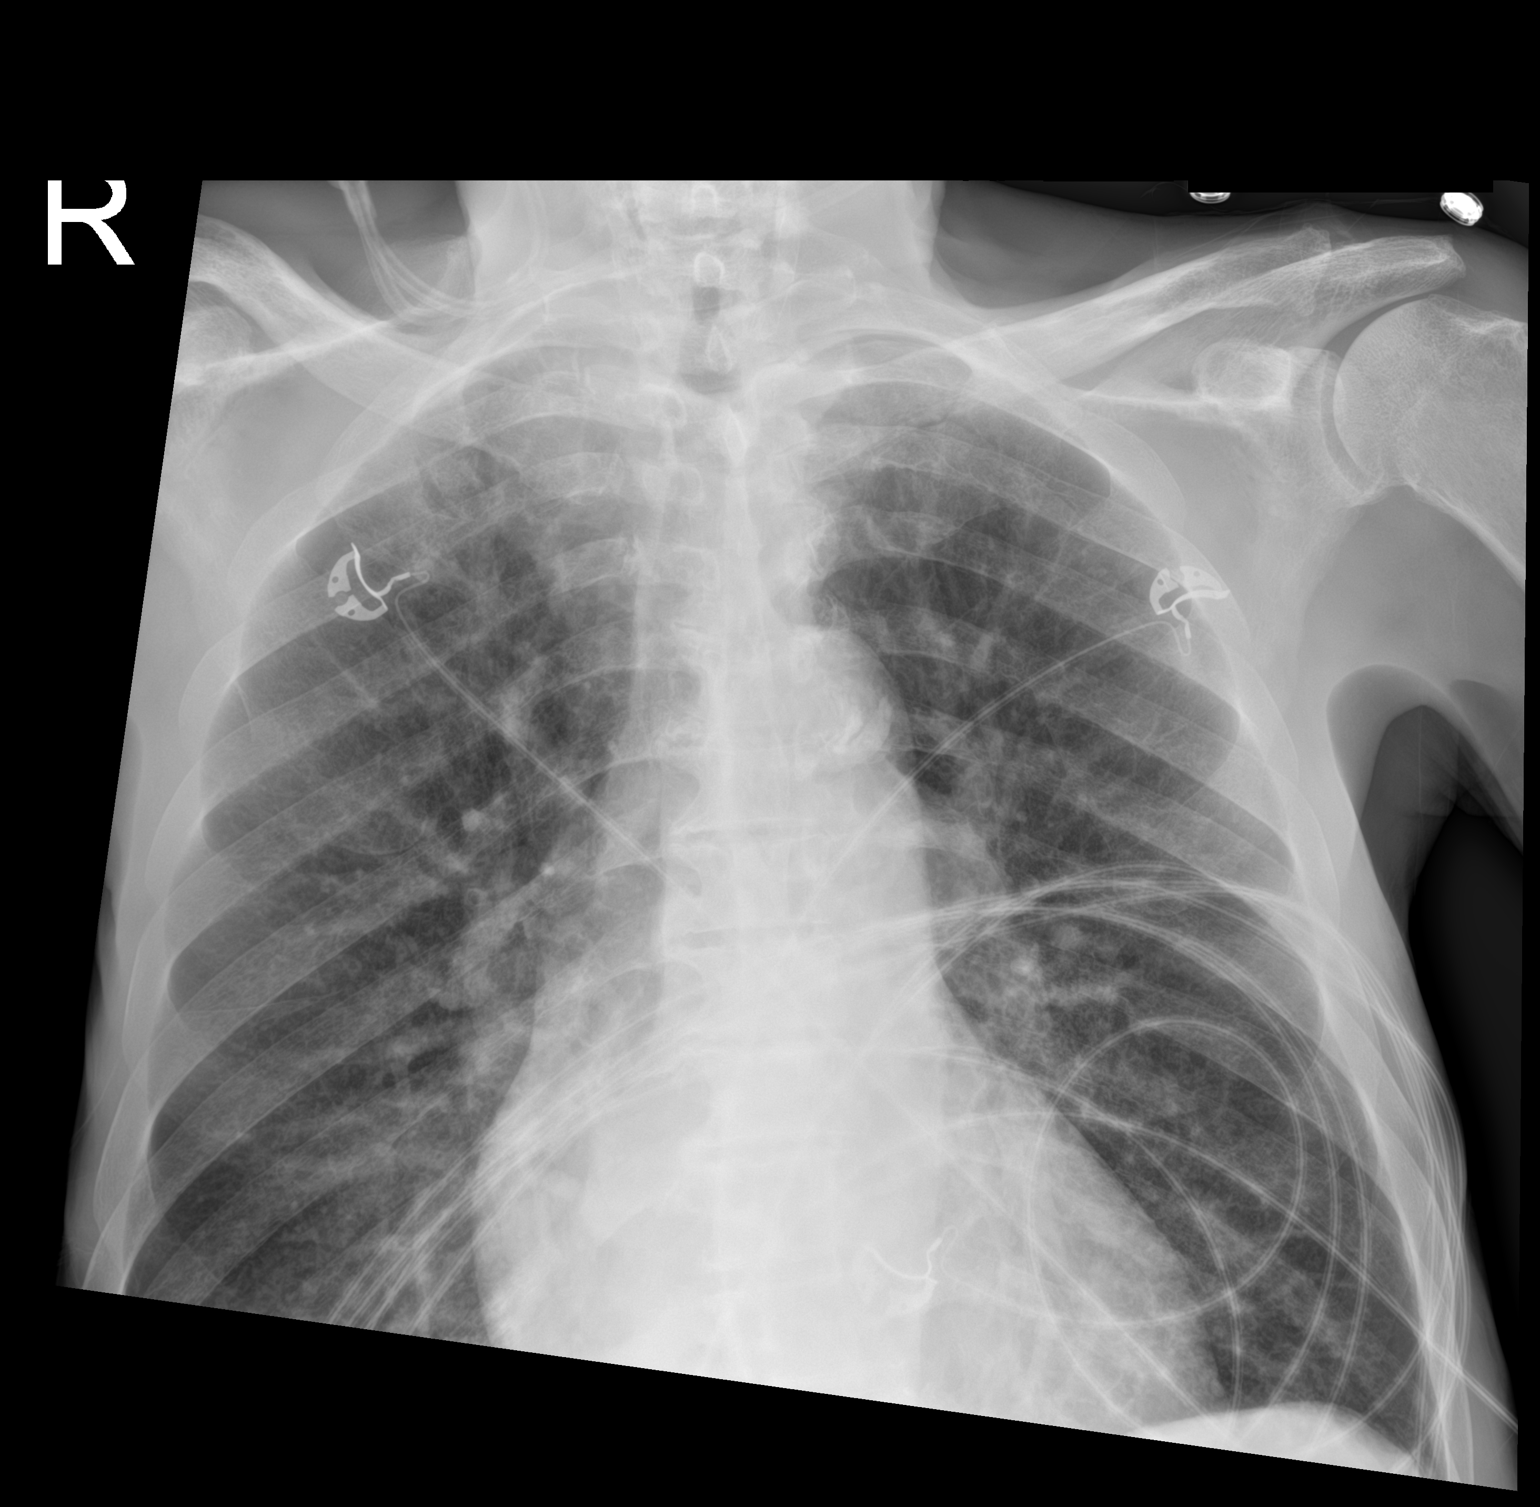

[chest ap (2 of 2)]
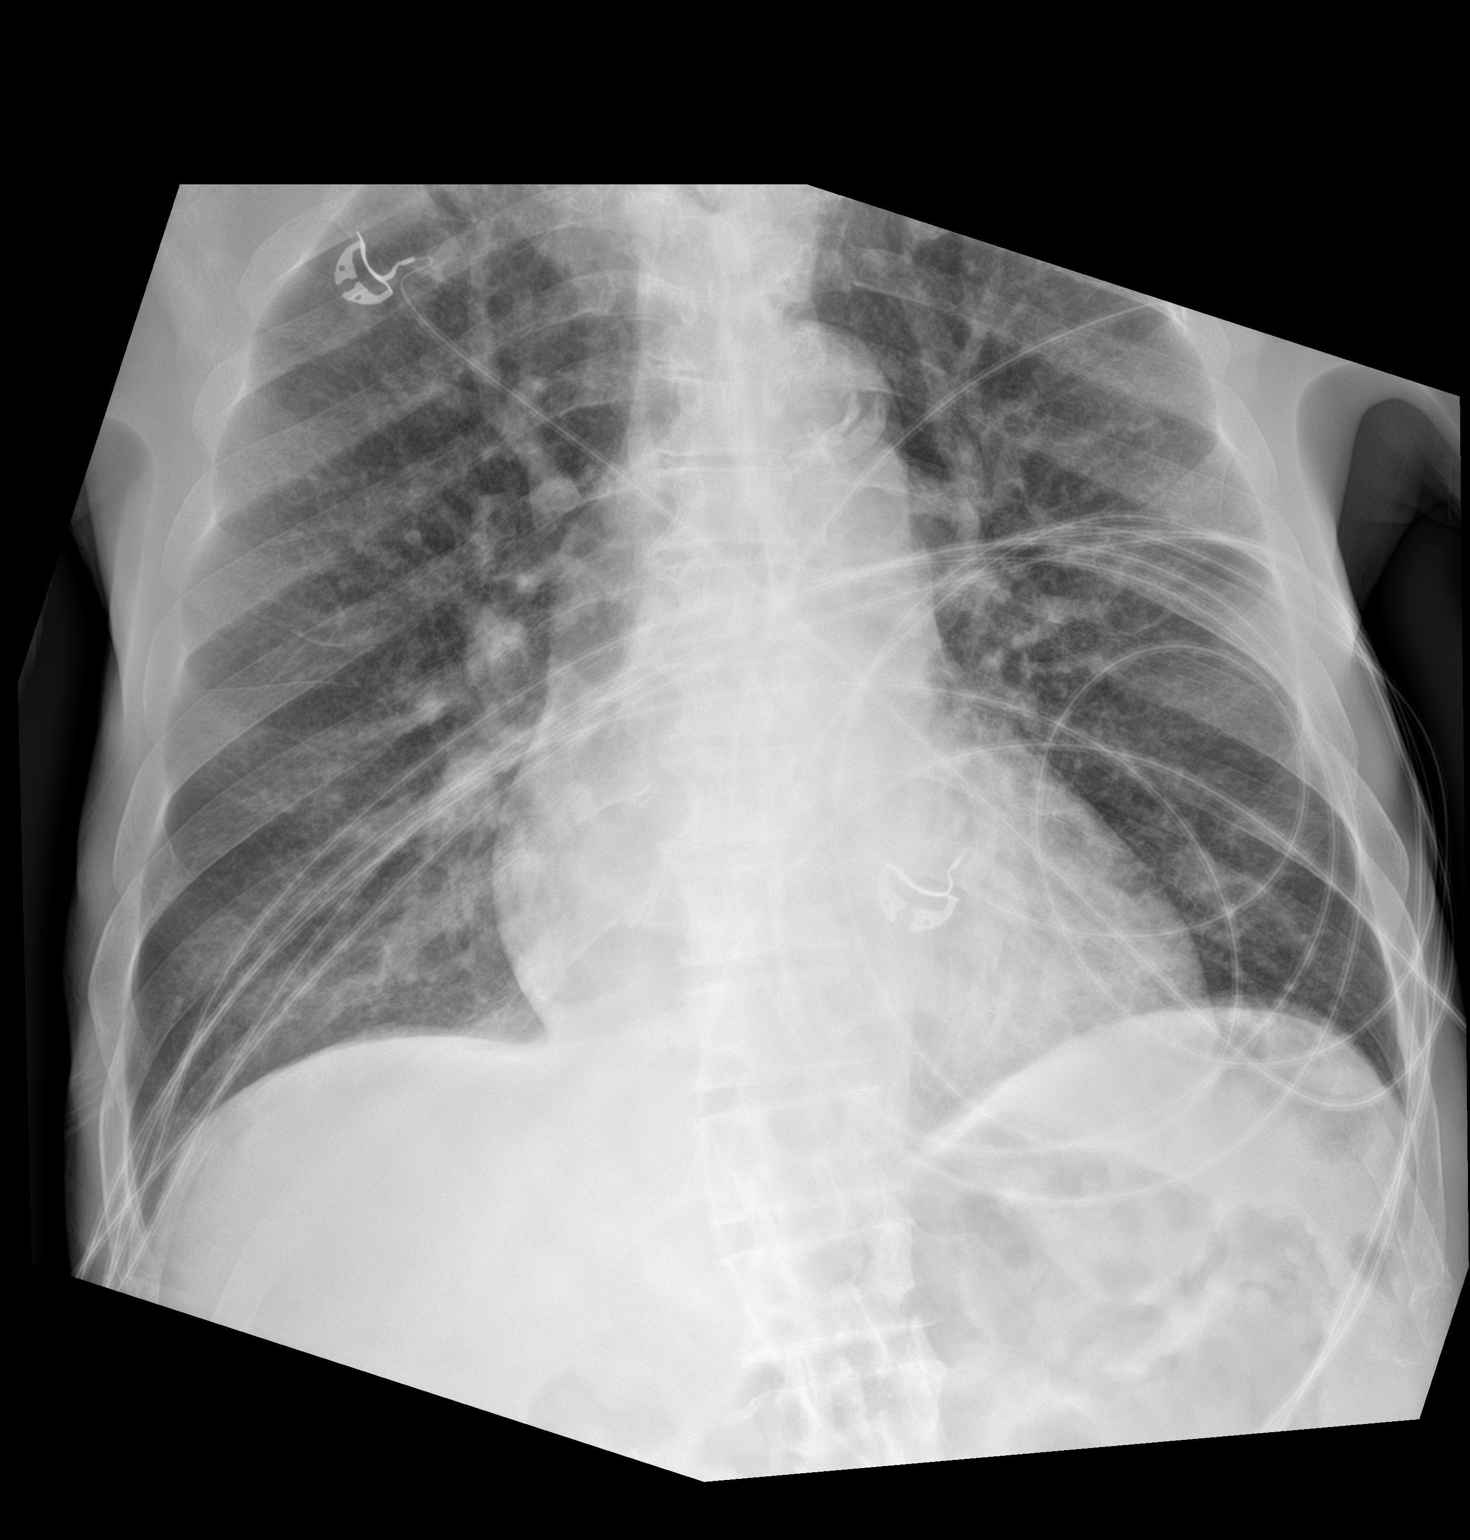

[2 of 2 positions shown; findings below may reference images not displayed]

FINDINGS: Cardiomegaly with pulmonary venous congestion. Mild interstitial
prominence again noted with interim improvement from prior exam.
Findings suggest improving interstitial edema. No pleural effusion
or pneumothorax. Degenerative changes scoliosis thoracic spine.
Carotid vascular calcification.
IMPRESSION: 1. Cardiomegaly with pulmonary venous congestion. Mild interstitial
prominence again noted with interim improvement from prior exam.
Findings suggest improving interstitial edema.

2.  Carotid vascular disease.

## 2022-01-01 ENCOUNTER — Ambulatory Visit: Payer: Medicare Other | Admitting: Pulmonary Disease

## 2022-01-17 DIAGNOSIS — J449 Chronic obstructive pulmonary disease, unspecified: Secondary | ICD-10-CM | POA: Diagnosis not present

## 2022-01-20 ENCOUNTER — Telehealth: Payer: Self-pay

## 2022-01-20 NOTE — Telephone Encounter (Signed)
Late Entry,  Incoming call was received from patient in the am stating he needs a copy of his medication list, last office note, and diagnosis list for a [employer] claim about treatment for COPD and CHF.  Requested documents were placed a the front desk (filing bind) for pick up, under the W's.

## 2022-02-17 DIAGNOSIS — J449 Chronic obstructive pulmonary disease, unspecified: Secondary | ICD-10-CM | POA: Diagnosis not present

## 2022-02-24 ENCOUNTER — Ambulatory Visit: Payer: Medicare Other | Admitting: Pulmonary Disease

## 2022-02-24 NOTE — Progress Notes (Deleted)
Synopsis: Referred in January 2023 for pulmonary emboli by Hazle Nordmann, NP  Subjective:   PATIENT ID: Mitchell King GENDER: male DOB: [birth year], MRN: [id number]   HPI  No chief complaint on file.   Martavis Gurney is a 71 year old male, former smoker with CHF, COPD, DMII and HTN who is referred to pulmonary clinic for COPD and chronic respiratory failure.   Patient was started on supplemental oxygen therapy in early 2022 after one of his hospitalizations for heart failure and pneumonia. He was most recently admitted in October 2022 for acute respiratory failure due to heart failure and pulmonary edema.   He has been doing well since his admission. He continues to use 2L O2 throughout the day and night. He has significant exertional dyspnea and is hoping to get better to the point where he is more independent. He has moved in with his son over the past year. He is using spiriva 2.5 mcg 1 puff twice daily and as needed albuterol or duoneb nebulizer treatment. He estimates using the duoneb or albuterol inhaler 7-8 times daily due to dyspnea with movement. He reports sinus congestion and drainage since having tubes in his nose from being in the ICU.   He does report a concern for OSA that was mentioned to him in the past.   He quit smoking last February after his first hospital admission.   Past Medical History:  Diagnosis Date   CHF (congestive heart failure) (HCC)    COPD (chronic obstructive pulmonary disease) (HCC)    Diabetes mellitus without complication (HCC)    Dyspnea    Hypertension      Family History  Problem Relation Age of Onset   Heart attack Brother      Social History   Socioeconomic History   Marital status: Divorced    Spouse name: Not on file   Number of children: Not on file   Years of education: Not on file   Highest education level: Not on file  Occupational History   Not on file  Tobacco Use   Smoking status: Former    Packs/day: 1.50    Years:  52.00    Total pack years: 78.00    Types: Cigarettes    Quit date: 07/22/2020    Years since quitting: 1.5   Smokeless tobacco: Never  Vaping Use   Vaping Use: Never used  Substance and Sexual Activity   Alcohol use: No    Comment: non x 5 years    Drug use: No   Sexual activity: Not Currently  Other Topics Concern   Not on file  Social History Narrative   Diet: Blank       Do you drink/ eat things with caffeine? No      Marital status:   Divorced                            What year were you married ? Blank      Do you live in a house, apartment,assistred living, condo, trailer, etc.)? House      Is it one or more stories? 1      How many persons live in your home ? 1      Do you have any pets in your home ?(please list) Dog      Highest Level of education completed: 12 th      Current or past profession: Blank  Do you exercise?    No                          Type & how often  Blank      ADVANCED DIRECTIVES (Please bring copies)      Do you have a living will? No      Do you have a DNR form? No                      If not, do you want to discuss one? No      Do you have signed POA?HPOA forms?  No               If so, please bring to your appointment No      FUNCTIONAL STATUS- To be completed by Spouse / child / Staff       Do you have difficulty bathing or dressing yourself ?  No        Do you have difficulty preparing food or eating ?  No      Do you have difficulty managing your mediation ?  No      Do you have difficulty managing your finances ?  No      Do you have difficulty affording your medication ?  No      Social Determinants of Corporate investment banker Strain: Not on file  Food Insecurity: Not on file  Transportation Needs: Not on file  Physical Activity: Not on file  Stress: Not on file  Social Connections: Not on file  Intimate Partner Violence: Not on file     No Known Allergies   Outpatient Medications Prior to Visit   Medication Sig Dispense Refill   acetaminophen (TYLENOL) 500 MG tablet Take 1,000 mg by mouth every 8 (eight) hours as needed for mild pain or headache (or headaches).     albuterol (VENTOLIN HFA) 108 (90 Base) MCG/ACT inhaler Inhale 2 puffs into the lungs every 4 (four) hours as needed for wheezing or shortness of breath. 6.7 g 0   ferrous sulfate 325 (65 FE) MG tablet Take 1 tablet (325 mg total) by mouth daily with breakfast. 30 tablet 0   ipratropium-albuterol (DUONEB) 0.5-2.5 (3) MG/3ML SOLN Take 3 mLs by nebulization every 4 (four) hours as needed. 360 mL 1   metoprolol tartrate (LOPRESSOR) 25 MG tablet Take 1 tablet (25 mg total) by mouth 2 (two) times daily. 60 tablet 0   tamsulosin (FLOMAX) 0.4 MG CAPS capsule Take 1 capsule by mouth daily.     Tiotropium Bromide Monohydrate (SPIRIVA RESPIMAT) 2.5 MCG/ACT AERS Inhale 1 puff into the lungs in the morning and at bedtime.     Tiotropium Bromide-Olodaterol (STIOLTO RESPIMAT) 2.5-2.5 MCG/ACT AERS Inhale 2 puffs into the lungs daily. 4 g 0   torsemide (DEMADEX) 20 MG tablet Take 1 tablet (20 mg total) by mouth daily as needed. For a weight over 216lbs, take a tablet of torsemide. If needing a tablet more than 2 days in a row, call your primary doctor. 30 tablet 0   No facility-administered medications prior to visit.   Review of Systems  Constitutional:  Positive for malaise/fatigue. Negative for chills, fever and weight loss.  HENT:  Positive for congestion. Negative for sinus pain and sore throat.   Eyes: Negative.   Respiratory:  Positive for shortness of breath. Negative for cough, hemoptysis, sputum production and wheezing.   Cardiovascular:  Negative for chest pain, palpitations, orthopnea, claudication and leg swelling.  Gastrointestinal:  Negative for abdominal pain, heartburn, nausea and vomiting.  Genitourinary: Negative.   Musculoskeletal:  Negative for joint pain and myalgias.  Skin:  Negative for rash.  Neurological:   Positive for weakness.  Endo/Heme/Allergies: Negative.   Psychiatric/Behavioral: Negative.      Objective:   There were no vitals filed for this visit.  Physical Exam Constitutional:      General: He is not in acute distress. HENT:     Head: Normocephalic and atraumatic.  Eyes:     Extraocular Movements: Extraocular movements intact.     Conjunctiva/sclera: Conjunctivae normal.     Pupils: Pupils are equal, round, and reactive to light.  Cardiovascular:     Rate and Rhythm: Normal rate and regular rhythm.     Pulses: Normal pulses.     Heart sounds: Normal heart sounds. No murmur heard. Pulmonary:     Effort: Pulmonary effort is normal.     Breath sounds: Decreased air movement present. No wheezing, rhonchi or rales.  Abdominal:     General: Bowel sounds are normal.     Palpations: Abdomen is soft.  Musculoskeletal:     Right lower leg: No edema.     Left lower leg: No edema.  Lymphadenopathy:     Cervical: No cervical adenopathy.  Skin:    General: Skin is warm and dry.  Neurological:     General: No focal deficit present.     Mental Status: He is alert.  Psychiatric:        Mood and Affect: Mood normal.        Behavior: Behavior normal.        Thought Content: Thought content normal.        Judgment: Judgment normal.    CBC    Component Value Date/Time   WBC 4.7 05/26/2021 1001   RBC 4.79 05/26/2021 1001   HGB 12.8 (L) 05/26/2021 1001   HCT 41.0 05/26/2021 1001   PLT 251 05/26/2021 1001   MCV 85.6 05/26/2021 1001   MCH 26.7 (L) 05/26/2021 1001   MCHC 31.2 (L) 05/26/2021 1001   RDW 16.2 (H) 05/26/2021 1001   LYMPHSABS 1,603 05/26/2021 1001   MONOABS 0.7 04/13/2021 1805   EOSABS 169 05/26/2021 1001   BASOSABS 52 05/26/2021 1001      Latest Ref Rng & Units 05/26/2021   10:01 AM 04/16/2021    4:37 AM 04/15/2021    4:43 AM  BMP  Glucose 65 - 99 mg/dL 440  347  99   BUN 7 - 25 mg/dL 58  31  29   Creatinine 0.70 - 1.28 mg/dL 4.25  9.56  3.87    BUN/Creat Ratio 6 - 22 (calc) 28     Sodium 135 - 146 mmol/L 136  135  133   Potassium 3.5 - 5.3 mmol/L 4.7  4.0  3.6   Chloride 98 - 110 mmol/L 98  90  89   CO2 20 - 32 mmol/L 31  38  37   Calcium 8.6 - 10.3 mg/dL 56.4  9.2  9.0    Chest imaging: CT Chest 04/13/21 Mediastinum/Nodes: No mediastinal, hilar, or axillary adenopathy. Trachea and esophagus are unremarkable. Thyroid unremarkable.   Lungs/Pleura: Mild to moderate centrilobular emphysema. Small right pleural effusion. Compressive atelectasis in the right lower lobe. No confluent opacities.  PFT:     No data to display  Labs:  Path:  Echo 04/14/21: LV EF 60-65%. Moderate concentric LVH. RV systolic function moderately reduced. RV size is moderately enlarged. Severely elevated PASP. LA Mildly dilated. RA mildly dilated.   Heart Catheterization:  Assessment & Plan:   No diagnosis found.  Discussion: Mitchell King is a 71 year old male, former smoker with CHF, COPD, DMII and HTN who is referred to pulmonary clinic for COPD and chronic respiratory failure.   He has centrilobular emphysema based on his recent CT Chest scan from 03/2021. He is to continue supplemental oxygen throughout the day and at night when sleeping. We will schedule him for split night sleep study for concern of obstructive sleep apnea.   We will trial him on stiolto 2 puffs daily and stop the spiriva. He is to call our office if he notices improvement on the stiolto and we will send in a prescription. He can continue duoneb nebulizer treatments as needed or albuterol inhaler every 4-6 hours.   We will refer him to our lung cancer screening program at next visit. We will consider to referral to pulmonary rehab, he is in the [facility] so remote pulm rehab could also be an option.  Follow up in 6 months with pulmonary function tests.  Melody Comas, MD [facility] Office: [phone]   Current Outpatient  Medications:    acetaminophen (TYLENOL) 500 MG tablet, Take 1,000 mg by mouth every 8 (eight) hours as needed for mild pain or headache (or headaches)., Disp: , Rfl:    albuterol (VENTOLIN HFA) 108 (90 Base) MCG/ACT inhaler, Inhale 2 puffs into the lungs every 4 (four) hours as needed for wheezing or shortness of breath., Disp: 6.7 g, Rfl: 0   ferrous sulfate 325 (65 FE) MG tablet, Take 1 tablet (325 mg total) by mouth daily with breakfast., Disp: 30 tablet, Rfl: 0   ipratropium-albuterol (DUONEB) 0.5-2.5 (3) MG/3ML SOLN, Take 3 mLs by nebulization every 4 (four) hours as needed., Disp: 360 mL, Rfl: 1   metoprolol tartrate (LOPRESSOR) 25 MG tablet, Take 1 tablet (25 mg total) by mouth 2 (two) times daily., Disp: 60 tablet, Rfl: 0   tamsulosin (FLOMAX) 0.4 MG CAPS capsule, Take 1 capsule by mouth daily., Disp: , Rfl:    Tiotropium Bromide Monohydrate (SPIRIVA RESPIMAT) 2.5 MCG/ACT AERS, Inhale 1 puff into the lungs in the morning and at bedtime., Disp: , Rfl:    Tiotropium Bromide-Olodaterol (STIOLTO RESPIMAT) 2.5-2.5 MCG/ACT AERS, Inhale 2 puffs into the lungs daily., Disp: 4 g, Rfl: 0   torsemide (DEMADEX) 20 MG tablet, Take 1 tablet (20 mg total) by mouth daily as needed. For a weight over 216lbs, take a tablet of torsemide. If needing a tablet more than 2 days in a row, call your primary doctor., Disp: 30 tablet, Rfl: 0

## 2022-03-20 DIAGNOSIS — J449 Chronic obstructive pulmonary disease, unspecified: Secondary | ICD-10-CM | POA: Diagnosis not present

## 2022-04-19 DIAGNOSIS — J449 Chronic obstructive pulmonary disease, unspecified: Secondary | ICD-10-CM | POA: Diagnosis not present

## 2022-04-26 ENCOUNTER — Encounter: Payer: Self-pay | Admitting: Physician Assistant

## 2022-05-20 DIAGNOSIS — J449 Chronic obstructive pulmonary disease, unspecified: Secondary | ICD-10-CM | POA: Diagnosis not present

## 2022-06-01 ENCOUNTER — Ambulatory Visit: Payer: Medicare Other | Admitting: Physician Assistant

## 2022-06-19 DIAGNOSIS — J449 Chronic obstructive pulmonary disease, unspecified: Secondary | ICD-10-CM | POA: Diagnosis not present

## 2022-07-02 NOTE — Progress Notes (Deleted)
She was    07/02/2022 Mitchell ZALOUDEK [id number] 09-Oct-1950  Referring provider: Yvonna Alanis, NP Primary GI doctor: {acdocs:27040}  ASSESSMENT AND PLAN:   There are no diagnoses linked to this encounter.   Patient Care Team: Yvonna Alanis, NP as PCP - General (Adult Health Nurse Practitioner)  HISTORY OF PRESENT ILLNESS: 72 y.o. 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   04/14/2021 echocardiogram follow-up for PE with cor pulmonale showed LVEF 60-65% no regional wall motion abnormalities, moderate LVH, interventricular septal flattening in systole and diastole consistent with right ventricular pressure and volume overload.  Right ventricular systolic function moderately educed right ventricular size moderately large, severely elevated pulmonary artery pressure with RVSP 67.1.   LA dilated, RA dilated grossly normal valves Last saw Dr. Erin Fulling 06/30/2021 for exertional dyspnea, on Spiriva.  On 2 L oxygen day and night.  Last labs available see her 05/26/2021 hemoglobin 12.8, MCV 85.6, platelets 251. He is on an iron.  [facility] however this was [facility]. From reviewing the notes appears patient was also complaining of melena. *** history of drinking? Presents today for evaluation.  He  reports that he quit smoking about 1 years ago. His smoking use included cigarettes. He has a 78.00 pack-year smoking history. He has never used smokeless tobacco. He reports that he does not drink alcohol and does not use drugs.  Current Medications:    Current Outpatient Medications (Cardiovascular):    metoprolol tartrate (LOPRESSOR) 25 MG tablet, Take 1 tablet (25 mg total) by mouth 2 (two) times daily.   torsemide (DEMADEX) 20 MG tablet, Take 1 tablet (20 mg total) by mouth daily as needed. For  a weight over 216lbs, take a tablet of torsemide. If needing a tablet more than 2 days in a row, call your primary doctor.  Current Outpatient Medications (Respiratory):    albuterol (VENTOLIN HFA) 108 (90 Base) MCG/ACT inhaler, Inhale 2 puffs into the lungs every 4 (four) hours as needed for wheezing or shortness of breath.   ipratropium-albuterol (DUONEB) 0.5-2.5 (3) MG/3ML SOLN, Take 3 mLs by nebulization every 4 (four) hours as needed.   Tiotropium Bromide Monohydrate (SPIRIVA RESPIMAT) 2.5 MCG/ACT AERS, Inhale 1 puff into the lungs in the morning and at bedtime.   Tiotropium Bromide-Olodaterol (STIOLTO RESPIMAT) 2.5-2.5 MCG/ACT AERS, Inhale 2 puffs into the lungs daily.  Current Outpatient Medications (Analgesics):    acetaminophen (TYLENOL) 500 MG tablet, Take 1,000 mg by mouth every 8 (eight) hours as needed for mild pain or headache (or headaches).  Current Outpatient Medications (Hematological):    ferrous sulfate 325 (65 FE) MG tablet, Take 1 tablet (325 mg total) by mouth daily with breakfast.  Current Outpatient Medications (Other):    tamsulosin (FLOMAX) 0.4 MG CAPS capsule, Take 1 capsule by mouth daily.  Medical History:  Past Medical History:  Diagnosis Date   CHF (congestive heart failure) (HCC)    COPD (chronic obstructive pulmonary disease) (HCC)    Diabetes mellitus without complication (HCC)    Dyspnea    Hypertension    Allergies: No Known Allergies   Surgical History:  He  has a past surgical history that includes No past surgeries. Family History:  His family history includes Heart attack in his brother.  REVIEW OF SYSTEMS  : All other  systems reviewed and negative except where noted in the History of Present Illness.  PHYSICAL EXAM: There were no vitals taken for this visit. General:   Pleasant, well developed male in no acute distress Head:   Normocephalic and atraumatic. Eyes:  sclerae anicteric,conjunctive pink  Heart:   {HEART EXAM  HEM/ONC:21750} Pulm:  Clear anteriorly; no wheezing Abdomen:   {BlankSingle:19197::"Distended","Ridged","Soft"}, {BlankSingle:19197::"Flat","Obese","Non-distended"} AB, {BlankSingle:19197::"Absent","Hyperactive, tinkling","Hypoactive","Sluggish","Active"} bowel sounds. {actendernessAB:27319} tenderness {anatomy; site abdomen:5010}. {BlankMultiple:19196::"Without guarding","With guarding","Without rebound","With rebound"}, No organomegaly appreciated. Rectal: {acrectalexam:27461} Extremities:  {With/Without:304960234} edema. Msk: Symmetrical without gross deformities. Peripheral pulses intact.  Neurologic:  Alert and  oriented x4;  No focal deficits.  Skin:   Dry and intact without significant lesions or rashes. Psychiatric:  Cooperative. Normal mood and affect.  RELEVANT LABS AND IMAGING: CBC    Component Value Date/Time   WBC 4.7 05/26/2021 1001   RBC 4.79 05/26/2021 1001   HGB 12.8 (L) 05/26/2021 1001   HCT 41.0 05/26/2021 1001   PLT 251 05/26/2021 1001   MCV 85.6 05/26/2021 1001   MCH 26.7 (L) 05/26/2021 1001   MCHC 31.2 (L) 05/26/2021 1001   RDW 16.2 (H) 05/26/2021 1001   LYMPHSABS 1,603 05/26/2021 1001   MONOABS 0.7 04/13/2021 1805   EOSABS 169 05/26/2021 1001   BASOSABS 52 05/26/2021 1001    CMP     Component Value Date/Time   NA 136 05/26/2021 1001   NA 141 10/23/2020 1226   K 4.7 05/26/2021 1001   CL 98 05/26/2021 1001   CO2 31 05/26/2021 1001   GLUCOSE 111 (H) 05/26/2021 1001   BUN 58 (H) 05/26/2021 1001   BUN 27 10/23/2020 1226   CREATININE 2.07 (H) 05/26/2021 1001   CALCIUM 10.0 05/26/2021 1001   PROT 9.0 (H) 05/26/2021 1001   ALBUMIN 3.8 04/15/2021 0443   AST 19 05/26/2021 1001   ALT 22 05/26/2021 1001   ALKPHOS 61 04/15/2021 0443   BILITOT 0.5 05/26/2021 1001   GFRNONAA 60 (L) 04/16/2021 0437   GFRAA >60 04/20/2017 [place] Loralie Malta, PA-C 10:32 AM

## 2022-07-05 ENCOUNTER — Ambulatory Visit: Payer: Medicare Other | Admitting: Physician Assistant

## 2022-07-20 DIAGNOSIS — J449 Chronic obstructive pulmonary disease, unspecified: Secondary | ICD-10-CM | POA: Diagnosis not present

## 2022-08-19 DIAGNOSIS — J449 Chronic obstructive pulmonary disease, unspecified: Secondary | ICD-10-CM | POA: Diagnosis not present

## 2022-09-18 DIAGNOSIS — J449 Chronic obstructive pulmonary disease, unspecified: Secondary | ICD-10-CM | POA: Diagnosis not present

## 2022-10-19 DIAGNOSIS — J449 Chronic obstructive pulmonary disease, unspecified: Secondary | ICD-10-CM | POA: Diagnosis not present

## 2022-11-18 DIAGNOSIS — J449 Chronic obstructive pulmonary disease, unspecified: Secondary | ICD-10-CM | POA: Diagnosis not present

## 2022-12-19 DIAGNOSIS — J449 Chronic obstructive pulmonary disease, unspecified: Secondary | ICD-10-CM | POA: Diagnosis not present

## 2023-01-18 DIAGNOSIS — J449 Chronic obstructive pulmonary disease, unspecified: Secondary | ICD-10-CM | POA: Diagnosis not present

## 2023-02-18 DIAGNOSIS — J449 Chronic obstructive pulmonary disease, unspecified: Secondary | ICD-10-CM | POA: Diagnosis not present

## 2023-03-21 DIAGNOSIS — J449 Chronic obstructive pulmonary disease, unspecified: Secondary | ICD-10-CM | POA: Diagnosis not present

## 2023-04-20 DIAGNOSIS — J449 Chronic obstructive pulmonary disease, unspecified: Secondary | ICD-10-CM | POA: Diagnosis not present

## 2023-05-21 DIAGNOSIS — J449 Chronic obstructive pulmonary disease, unspecified: Secondary | ICD-10-CM | POA: Diagnosis not present

## 2023-06-16 ENCOUNTER — Telehealth: Payer: Medicare Other

## 2023-06-16 NOTE — Telephone Encounter (Signed)
[place] with [employer] called to check to status of oxygen orders faxed about a week ago. Per Octavia Heir, NP patient not seen in over 2 years and would need an appointment in order for [place] to sign any orders. Also per Octavia Heir, NP patient may have resumed care with [facility] physician.  [facility] plans to follow-up with patient

## 2023-06-20 DIAGNOSIS — J449 Chronic obstructive pulmonary disease, unspecified: Secondary | ICD-10-CM | POA: Diagnosis not present

## 2023-07-21 DIAGNOSIS — J449 Chronic obstructive pulmonary disease, unspecified: Secondary | ICD-10-CM | POA: Diagnosis not present

## 2023-08-19 DIAGNOSIS — J449 Chronic obstructive pulmonary disease, unspecified: Secondary | ICD-10-CM | POA: Diagnosis not present

## 2023-09-18 DIAGNOSIS — J449 Chronic obstructive pulmonary disease, unspecified: Secondary | ICD-10-CM | POA: Diagnosis not present

## 2023-10-19 DIAGNOSIS — J449 Chronic obstructive pulmonary disease, unspecified: Secondary | ICD-10-CM | POA: Diagnosis not present

## 2023-11-18 DIAGNOSIS — J449 Chronic obstructive pulmonary disease, unspecified: Secondary | ICD-10-CM | POA: Diagnosis not present

## 2023-12-19 DIAGNOSIS — J449 Chronic obstructive pulmonary disease, unspecified: Secondary | ICD-10-CM | POA: Diagnosis not present

## 2024-03-29 ENCOUNTER — Emergency Department (HOSPITAL_COMMUNITY)

## 2024-03-29 ENCOUNTER — Inpatient Hospital Stay (HOSPITAL_COMMUNITY)
Admission: EM | Admit: 2024-03-29 | Discharge: 2024-04-10 | DRG: 871 | Disposition: A | Discharge status: Skilled Nursing Facility | Attending: Internal Medicine | Admitting: Internal Medicine

## 2024-03-29 ENCOUNTER — Encounter (HOSPITAL_COMMUNITY): Payer: Self-pay | Admitting: Internal Medicine

## 2024-03-29 DIAGNOSIS — Z87891 Personal history of nicotine dependence: Secondary | ICD-10-CM | POA: Diagnosis not present

## 2024-03-29 DIAGNOSIS — Z8249 Family history of ischemic heart disease and other diseases of the circulatory system: Secondary | ICD-10-CM | POA: Diagnosis not present

## 2024-03-29 DIAGNOSIS — N179 Acute kidney failure, unspecified: Secondary | ICD-10-CM | POA: Diagnosis present

## 2024-03-29 DIAGNOSIS — I10 Essential (primary) hypertension: Secondary | ICD-10-CM | POA: Diagnosis not present

## 2024-03-29 DIAGNOSIS — J9601 Acute respiratory failure with hypoxia: Principal | ICD-10-CM | POA: Diagnosis present

## 2024-03-29 DIAGNOSIS — N182 Chronic kidney disease, stage 2 (mild): Secondary | ICD-10-CM | POA: Diagnosis present

## 2024-03-29 DIAGNOSIS — U071 COVID-19: Secondary | ICD-10-CM | POA: Diagnosis present

## 2024-03-29 DIAGNOSIS — R54 Age-related physical debility: Secondary | ICD-10-CM | POA: Diagnosis present

## 2024-03-29 DIAGNOSIS — A4189 Other specified sepsis: Principal | ICD-10-CM | POA: Diagnosis present

## 2024-03-29 DIAGNOSIS — E876 Hypokalemia: Secondary | ICD-10-CM | POA: Diagnosis present

## 2024-03-29 DIAGNOSIS — I130 Hypertensive heart and chronic kidney disease with heart failure and stage 1 through stage 4 chronic kidney disease, or unspecified chronic kidney disease: Secondary | ICD-10-CM | POA: Diagnosis present

## 2024-03-29 DIAGNOSIS — F05 Delirium due to known physiological condition: Secondary | ICD-10-CM | POA: Diagnosis not present

## 2024-03-29 DIAGNOSIS — I5023 Acute on chronic systolic (congestive) heart failure: Secondary | ICD-10-CM | POA: Diagnosis not present

## 2024-03-29 DIAGNOSIS — E872 Acidosis, unspecified: Secondary | ICD-10-CM | POA: Diagnosis present

## 2024-03-29 DIAGNOSIS — Z7951 Long term (current) use of inhaled steroids: Secondary | ICD-10-CM | POA: Diagnosis not present

## 2024-03-29 DIAGNOSIS — I5033 Acute on chronic diastolic (congestive) heart failure: Secondary | ICD-10-CM | POA: Diagnosis present

## 2024-03-29 DIAGNOSIS — I2781 Cor pulmonale (chronic): Secondary | ICD-10-CM | POA: Diagnosis present

## 2024-03-29 DIAGNOSIS — E1122 Type 2 diabetes mellitus with diabetic chronic kidney disease: Secondary | ICD-10-CM | POA: Diagnosis present

## 2024-03-29 DIAGNOSIS — J9621 Acute and chronic respiratory failure with hypoxia: Secondary | ICD-10-CM | POA: Diagnosis present

## 2024-03-29 DIAGNOSIS — I509 Heart failure, unspecified: Secondary | ICD-10-CM

## 2024-03-29 DIAGNOSIS — Z91199 Patient's noncompliance with other medical treatment and regimen due to unspecified reason: Secondary | ICD-10-CM

## 2024-03-29 DIAGNOSIS — Z7984 Long term (current) use of oral hypoglycemic drugs: Secondary | ICD-10-CM | POA: Diagnosis not present

## 2024-03-29 DIAGNOSIS — K709 Alcoholic liver disease, unspecified: Secondary | ICD-10-CM | POA: Diagnosis present

## 2024-03-29 DIAGNOSIS — J441 Chronic obstructive pulmonary disease with (acute) exacerbation: Secondary | ICD-10-CM | POA: Diagnosis present

## 2024-03-29 DIAGNOSIS — I472 Ventricular tachycardia, unspecified: Secondary | ICD-10-CM | POA: Diagnosis present

## 2024-03-29 DIAGNOSIS — E119 Type 2 diabetes mellitus without complications: Secondary | ICD-10-CM | POA: Diagnosis not present

## 2024-03-29 DIAGNOSIS — J432 Centrilobular emphysema: Secondary | ICD-10-CM | POA: Diagnosis present

## 2024-03-29 DIAGNOSIS — I2729 Other secondary pulmonary hypertension: Secondary | ICD-10-CM | POA: Diagnosis present

## 2024-03-29 DIAGNOSIS — Z9981 Dependence on supplemental oxygen: Secondary | ICD-10-CM | POA: Diagnosis not present

## 2024-03-29 LAB — BRAIN NATRIURETIC PEPTIDE: B Natriuretic Peptide: 1440.7 pg/mL — ABNORMAL HIGH (ref 0.0–100.0)

## 2024-03-29 LAB — URINALYSIS, ROUTINE W REFLEX MICROSCOPIC
Bilirubin Urine: NEGATIVE
Glucose, UA: >=500 mg/dL — AB
Ketones, ur: NEGATIVE mg/dL
Leukocytes,Ua: NEGATIVE
Nitrite: NEGATIVE
Protein, ur: 100 mg/dL — AB
Specific Gravity, Urine: 1.009 (ref 1.005–1.030)
pH: 5 (ref 5.0–8.0)

## 2024-03-29 LAB — CBG MONITORING, ED: Glucose-Capillary: 131 mg/dL — ABNORMAL HIGH (ref 70–99)

## 2024-03-29 LAB — I-STAT CG4 LACTIC ACID, ED
Lactic Acid, Venous: 2.5 mmol/L (ref 0.5–1.9)
Lactic Acid, Venous: 1.8 mmol/L (ref 0.5–1.9)

## 2024-03-29 LAB — BASIC METABOLIC PANEL WITH GFR
Anion gap: 13 (ref 5–15)
BUN: 7 mg/dL — ABNORMAL LOW (ref 8–23)
CO2: 31 mmol/L (ref 22–32)
Calcium: 8.7 mg/dL — ABNORMAL LOW (ref 8.9–10.3)
Chloride: 92 mmol/L — ABNORMAL LOW (ref 98–111)
Creatinine, Ser: 1.14 mg/dL (ref 0.61–1.24)
GFR, Estimated: >60 mL/min (ref >60)
Glucose, Bld: 111 mg/dL — ABNORMAL HIGH (ref 70–99)
Potassium: 3.2 mmol/L — ABNORMAL LOW (ref 3.5–5.1)
Sodium: 136 mmol/L (ref 135–145)

## 2024-03-29 LAB — I-STAT VENOUS BLOOD GAS, ED
Acid-Base Excess: 10 mmol/L — ABNORMAL HIGH (ref 0.0–2.0)
Bicarbonate: 34.7 mmol/L — ABNORMAL HIGH (ref 20.0–28.0)
Calcium, Ion: 0.99 mmol/L — ABNORMAL LOW (ref 1.15–1.40)
HCT: 49 % (ref 39.0–52.0)
Hemoglobin: 16.7 g/dL (ref 13.0–17.0)
O2 Saturation: 81 %
Potassium: 3.1 mmol/L — ABNORMAL LOW (ref 3.5–5.1)
Sodium: 137 mmol/L (ref 135–145)
TCO2: 36 mmol/L — ABNORMAL HIGH (ref 22–32)
pCO2, Ven: 46.5 mmHg (ref 44–60)
pH, Ven: 7.482 — ABNORMAL HIGH (ref 7.25–7.43)
pO2, Ven: 43 mmHg (ref 32–45)

## 2024-03-29 LAB — CBC WITH DIFFERENTIAL/PLATELET
Abs Immature Granulocytes: 0.03 K/uL (ref 0.00–0.07)
Basophils Absolute: 0 K/uL (ref 0.0–0.1)
Basophils Relative: 1 %
Eosinophils Absolute: 0 K/uL (ref 0.0–0.5)
Eosinophils Relative: 1 %
HCT: 45.8 % (ref 39.0–52.0)
Hemoglobin: 13.7 g/dL (ref 13.0–17.0)
Immature Granulocytes: 1 %
Lymphocytes Relative: 20 %
Lymphs Abs: 0.4 K/uL — ABNORMAL LOW (ref 0.7–4.0)
MCH: 30.2 pg (ref 26.0–34.0)
MCHC: 29.9 g/dL — ABNORMAL LOW (ref 30.0–36.0)
MCV: 101.1 fL — ABNORMAL HIGH (ref 80.0–100.0)
Monocytes Absolute: 0.5 K/uL (ref 0.1–1.0)
Monocytes Relative: 21 %
Neutro Abs: 1.2 K/uL — ABNORMAL LOW (ref 1.7–7.7)
Neutrophils Relative %: 56 %
Platelets: 109 K/uL — ABNORMAL LOW (ref 150–400)
RBC: 4.53 MIL/uL (ref 4.22–5.81)
RDW: 14.8 % (ref 11.5–15.5)
WBC: 2.2 K/uL — ABNORMAL LOW (ref 4.0–10.5)
nRBC: 0 % (ref 0.0–0.2)

## 2024-03-29 LAB — RESP PANEL BY RT-PCR (RSV, FLU A&B, COVID)  RVPGX2
Influenza A by PCR: NEGATIVE
Influenza B by PCR: NEGATIVE
Resp Syncytial Virus by PCR: NEGATIVE
SARS Coronavirus 2 by RT PCR: POSITIVE — AB

## 2024-03-29 LAB — PROTIME-INR
INR: 1.2 (ref 0.8–1.2)
Prothrombin Time: 15.5 s — ABNORMAL HIGH (ref 11.4–15.2)

## 2024-03-29 LAB — HEMOGLOBIN A1C
Hgb A1c MFr Bld: 5.6 % (ref 4.8–5.6)
Mean Plasma Glucose: 114.02 mg/dL

## 2024-03-29 MED ORDER — IPRATROPIUM-ALBUTEROL 0.5-2.5 (3) MG/3ML IN SOLN
3.0000 mL | Freq: Four times a day (QID) | RESPIRATORY_TRACT | Status: DC
  Administered 2024-03-30 – 2024-04-02 (×11): 3 mL via RESPIRATORY_TRACT
  Filled 2024-03-29 (×11): qty 3

## 2024-03-29 MED ORDER — SODIUM CHLORIDE 0.9% FLUSH
3.0000 mL | Freq: Two times a day (BID) | INTRAVENOUS | Status: DC
  Administered 2024-03-29 – 2024-04-07 (×15): 3 mL via INTRAVENOUS

## 2024-03-29 MED ORDER — BARICITINIB 2 MG PO TABS
4.0000 mg | ORAL_TABLET | Freq: Every day | ORAL | Status: DC
Start: 2024-03-30 — End: 2024-03-29

## 2024-03-29 MED ORDER — SODIUM CHLORIDE 0.9 % IV SOLN
100.0000 mg | Freq: Every day | INTRAVENOUS | Status: DC
  Filled 2024-03-29: qty 20

## 2024-03-29 MED ORDER — ALBUTEROL SULFATE (2.5 MG/3ML) 0.083% IN NEBU
10.0000 mg/h | INHALATION_SOLUTION | Freq: Once | RESPIRATORY_TRACT | Status: AC
  Administered 2024-03-29: 10 mg/h via RESPIRATORY_TRACT
  Filled 2024-03-29: qty 12

## 2024-03-29 MED ORDER — ONDANSETRON HCL 4 MG PO TABS: 4.0000 mg | ORAL_TABLET | Freq: Four times a day (QID) | ORAL | Status: DC | PRN

## 2024-03-29 MED ORDER — SODIUM CHLORIDE 0.9% FLUSH: 3.0000 mL | INTRAVENOUS | Status: DC | PRN

## 2024-03-29 MED ORDER — INSULIN ASPART 100 UNIT/ML IJ SOLN: 0.0000 [IU] | Freq: Every day | INTRAMUSCULAR | Status: DC

## 2024-03-29 MED ORDER — DEXAMETHASONE SOD PHOSPHATE PF 10 MG/ML IJ SOLN
6.0000 mg | INTRAMUSCULAR | Status: DC
  Administered 2024-03-29 – 2024-03-30 (×2): 6 mg via INTRAVENOUS

## 2024-03-29 MED ORDER — AZITHROMYCIN 500 MG IV SOLR
500.0000 mg | INTRAVENOUS | Status: DC
  Filled 2024-03-29: qty 5

## 2024-03-29 MED ORDER — ACETAMINOPHEN 325 MG PO TABS
650.0000 mg | ORAL_TABLET | Freq: Four times a day (QID) | ORAL | Status: DC | PRN
  Administered 2024-04-04 – 2024-04-08 (×3): 650 mg via ORAL
  Filled 2024-03-29 (×3): qty 2

## 2024-03-29 MED ORDER — SODIUM CHLORIDE 0.9 % IV SOLN
200.0000 mg | Freq: Once | INTRAVENOUS | Status: DC
  Administered 2024-03-29: 200 mg via INTRAVENOUS
  Filled 2024-03-29: qty 40

## 2024-03-29 MED ORDER — ENOXAPARIN SODIUM 40 MG/0.4ML IJ SOSY
40.0000 mg | PREFILLED_SYRINGE | INTRAMUSCULAR | Status: DC
  Administered 2024-03-29 – 2024-04-05 (×8): 40 mg via SUBCUTANEOUS
  Filled 2024-03-29 (×8): qty 0.4

## 2024-03-29 MED ORDER — MAGNESIUM SULFATE 2 GM/50ML IV SOLN
2.0000 g | Freq: Once | INTRAVENOUS | Status: AC
  Administered 2024-03-29: 2 g via INTRAVENOUS
  Filled 2024-03-29: qty 50

## 2024-03-29 MED ORDER — POTASSIUM CHLORIDE 10 MEQ/100ML IV SOLN
10.0000 meq | INTRAVENOUS | Status: AC
  Administered 2024-03-29 – 2024-03-30 (×4): 10 meq via INTRAVENOUS
  Filled 2024-03-29 (×4): qty 100

## 2024-03-29 MED ORDER — SODIUM CHLORIDE 0.9% FLUSH
3.0000 mL | Freq: Two times a day (BID) | INTRAVENOUS | Status: DC
  Administered 2024-03-29 – 2024-04-06 (×17): 3 mL via INTRAVENOUS

## 2024-03-29 MED ORDER — IPRATROPIUM-ALBUTEROL 0.5-2.5 (3) MG/3ML IN SOLN: 3.0000 mL | Freq: Four times a day (QID) | RESPIRATORY_TRACT | Status: DC | PRN

## 2024-03-29 MED ORDER — SODIUM CHLORIDE 0.9 % IV SOLN: 1.0000 g | INTRAVENOUS | Status: DC

## 2024-03-29 MED ORDER — ONDANSETRON HCL 4 MG/2ML IJ SOLN: 4.0000 mg | Freq: Four times a day (QID) | INTRAMUSCULAR | Status: DC | PRN

## 2024-03-29 MED ORDER — FUROSEMIDE 10 MG/ML IJ SOLN
40.0000 mg | Freq: Once | INTRAMUSCULAR | Status: AC
  Administered 2024-03-29: 40 mg via INTRAVENOUS
  Filled 2024-03-29: qty 4

## 2024-03-29 MED ORDER — INSULIN ASPART 100 UNIT/ML IJ SOLN: 0.0000 [IU] | Freq: Three times a day (TID) | INTRAMUSCULAR | Status: DC

## 2024-03-29 MED ORDER — SODIUM CHLORIDE 0.9 % IV SOLN
500.0000 mg | Freq: Once | INTRAVENOUS | Status: AC
  Administered 2024-03-29: 500 mg via INTRAVENOUS
  Filled 2024-03-29: qty 5

## 2024-03-29 MED ORDER — ACETAMINOPHEN 650 MG RE SUPP: 650.0000 mg | Freq: Four times a day (QID) | RECTAL | Status: DC | PRN

## 2024-03-29 MED ORDER — GUAIFENESIN ER 600 MG PO TB12
600.0000 mg | ORAL_TABLET | Freq: Two times a day (BID) | ORAL | Status: DC
  Administered 2024-03-30 – 2024-04-10 (×23): 600 mg via ORAL
  Filled 2024-03-29 (×23): qty 1

## 2024-03-29 MED ORDER — SODIUM CHLORIDE 0.9 % IV SOLN: 250.0000 mL | INTRAVENOUS | Status: AC | PRN

## 2024-03-29 MED ORDER — LACTATED RINGERS IV BOLUS
1000.0000 mL | Freq: Once | INTRAVENOUS | Status: AC
  Administered 2024-03-29: 1000 mL via INTRAVENOUS

## 2024-03-29 MED ORDER — SODIUM CHLORIDE 0.9 % IV SOLN: 500.0000 mg | INTRAVENOUS | Status: DC

## 2024-03-29 MED ORDER — SODIUM CHLORIDE 0.9 % IV SOLN
1.0000 g | Freq: Once | INTRAVENOUS | Status: AC
  Administered 2024-03-29: 1 g via INTRAVENOUS
  Filled 2024-03-29: qty 10

## 2024-03-29 MED ORDER — BARICITINIB 2 MG PO TABS
4.0000 mg | ORAL_TABLET | Freq: Every day | ORAL | Status: DC
  Administered 2024-03-30: 4 mg via ORAL
  Filled 2024-03-29 (×2): qty 2

## 2024-03-29 MED ORDER — METHYLPREDNISOLONE SODIUM SUCC 125 MG IJ SOLR: 125.0000 mg | Freq: Once | INTRAMUSCULAR | Status: DC

## 2024-03-29 NOTE — ED Notes (Signed)
 CCMD contacted to put patient on cardiac monitoring.

## 2024-03-29 NOTE — ED Notes (Addendum)
 Pt placed on BiPap

## 2024-03-29 NOTE — ED Provider Notes (Signed)
 [place] EMERGENCY DEPARTMENT AT [facility] Provider Note   CSN: [id number] Arrival date & time: 03/29/24  1723     Patient presents with: Shortness of Breath   Mitchell King is a 73 y.o. male.   73 year old male presents today for concern of shortness of breath that has been worsening over the past 2 days.  Has history of congestive heart failure.  He reports compliance with his home medicines. Patient hypoxic on arrival.  The history is provided by the patient. No language interpreter was used.       Prior to Admission medications   Medication Sig Start Date End Date Taking? Authorizing Provider  acetaminophen  (TYLENOL ) 500 MG tablet Take 1,000 mg by mouth every 8 (eight) hours as needed for mild pain or headache (or headaches).    [provider]  albuterol  (VENTOLIN  HFA) 108 (90 Base) MCG/ACT inhaler Inhale 2 puffs into the lungs every 4 (four) hours as needed for wheezing or shortness of breath. 09/04/20   Gil Greig BRAVO, NP  ferrous sulfate  325 (65 FE) MG tablet Take 1 tablet (325 mg total) by mouth daily with breakfast. 04/18/21 06/30/21  Lenon Marien CROME, MD  ipratropium-albuterol  (DUONEB) 0.5-2.5 (3) MG/3ML SOLN Take 3 mLs by nebulization every 4 (four) hours as needed. 09/04/20   Fargo, Amy E, NP  metoprolol  tartrate (LOPRESSOR ) 25 MG tablet Take 1 tablet (25 mg total) by mouth 2 (two) times daily. 04/17/21 06/30/21  Lenon Marien CROME, MD  tamsulosin  (FLOMAX ) 0.4 MG CAPS capsule Take 1 capsule by mouth daily. 10/31/20   [provider]  Tiotropium Bromide  Monohydrate (SPIRIVA  RESPIMAT) 2.5 MCG/ACT AERS Inhale 1 puff into the lungs in the morning and at bedtime.    [provider]  Tiotropium Bromide -Olodaterol (STIOLTO RESPIMAT ) 2.5-2.5 MCG/ACT AERS Inhale 2 puffs into the lungs daily. 06/30/21   Kara Dorn NOVAK, MD  torsemide  (DEMADEX ) 20 MG tablet Take 1 tablet (20 mg total) by mouth daily as needed. For a weight over 216lbs, take  a tablet of torsemide . If needing a tablet more than 2 days in a row, call your primary doctor. 04/17/21 06/30/21  Lenon Marien CROME, MD  Fluticasone -Salmeterol (ADVAIR DISKUS) 250-50 MCG/DOSE AEPB Inhale 1 puff into the lungs 2 (two) times daily. 11/20/11 10/11/13  Sonda Alm BROCKS, MD    Allergies: Patient has no known allergies.    Review of Systems  Unable to perform ROS: Acuity of condition  Constitutional:  Negative for chills and fever.  Respiratory:  Positive for shortness of breath.   Cardiovascular:  Positive for leg swelling. Negative for chest pain and palpitations.  Gastrointestinal:  Negative for abdominal pain.  Neurological:  Negative for light-headedness.  All other systems reviewed and are negative.   Updated Vital Signs BP 137/77   Pulse 65   Temp 99.8 F (37.7 C) (Rectal)   Resp (!) 24   SpO2 100%   Physical Exam Vitals and nursing note reviewed.  Constitutional:      General: He is not in acute distress.    Appearance: Normal appearance. He is not ill-appearing.  HENT:     Head: Normocephalic and atraumatic.     Nose: Nose normal.  Eyes:     Conjunctiva/sclera: Conjunctivae normal.  Cardiovascular:     Rate and Rhythm: Regular rhythm. Tachycardia present.  Pulmonary:     Effort: Pulmonary effort is normal. No respiratory distress.     Breath sounds: No wheezing.     Comments: Diminished  lung sounds throughout Abdominal:     General: There is no distension.     Palpations: Abdomen is soft.     Tenderness: There is no abdominal tenderness.  Musculoskeletal:        General: No deformity.     Right lower leg: Edema present.     Left lower leg: Edema present.  Skin:    Findings: No rash.  Neurological:     Mental Status: He is alert.     (all labs ordered are listed, but only abnormal results are displayed) Labs Reviewed  RESP PANEL BY RT-PCR (RSV, FLU A&B, COVID)  RVPGX2 - Abnormal; Notable for the following components:      Result Value   SARS  Coronavirus 2 by RT PCR POSITIVE (*)    All other components within normal limits  BASIC METABOLIC PANEL WITH GFR - Abnormal; Notable for the following components:   Potassium 3.2 (*)    Chloride 92 (*)    Glucose, Bld 111 (*)    BUN 7 (*)    Calcium 8.7 (*)    All other components within normal limits  CBC WITH DIFFERENTIAL/PLATELET - Abnormal; Notable for the following components:   WBC 2.2 (*)    MCV 101.1 (*)    MCHC 29.9 (*)    Platelets 109 (*)    Neutro Abs 1.2 (*)    Lymphs Abs 0.4 (*)    All other components within normal limits  BRAIN NATRIURETIC PEPTIDE - Abnormal; Notable for the following components:   B Natriuretic Peptide 1,440.7 (*)    All other components within normal limits  PROTIME-INR - Abnormal; Notable for the following components:   Prothrombin Time 15.5 (*)    All other components within normal limits  I-STAT CG4 LACTIC ACID, ED - Abnormal; Notable for the following components:   Lactic Acid, Venous 2.5 (*)    All other components within normal limits  I-STAT VENOUS BLOOD GAS, ED - Abnormal; Notable for the following components:   pH, Ven 7.482 (*)    Bicarbonate 34.7 (*)    TCO2 36 (*)    Acid-Base Excess 10.0 (*)    Potassium 3.1 (*)    Calcium, Ion 0.99 (*)    All other components within normal limits  CULTURE, BLOOD (ROUTINE X 2)  CULTURE, BLOOD (ROUTINE X 2)  URINALYSIS, ROUTINE W REFLEX MICROSCOPIC  I-STAT CG4 LACTIC ACID, ED    EKG: EKG Interpretation Date/Time:  Thursday March 29 2024 17:44:57 EDT Ventricular Rate:  94 PR Interval:    QRS Duration:  121 QT Interval:  369 QTC Calculation: 462 R Axis:   262  Text Interpretation: Paired ventricular premature complexes Nonspecific IVCD with LAD Nonspecific T abnrm, anterolateral leads Interpretation limited secondary to artifact Confirmed by Elnor Savant ([id number]) on 03/29/2024 6:10:15 PM  Radiology: [facility] Chest Port 1 View Result Date: 03/29/2024 CLINICAL DATA:  dib short of breath. EXAM:  PORTABLE CHEST 1 VIEW COMPARISON:  Chest x-ray 06/13/2021 FINDINGS: The heart and mediastinal contours are unchanged. Atherosclerotic plaque. Prominent hilar vasculature. Bibasilar atelectasis. No focal consolidation. Chronic coarsened interstitial markings with no overt pulmonary edema. No pleural effusion. No pneumothorax. No acute osseous abnormality. IMPRESSION: 1. Cardiomegaly with pulmonary venous congestion. 2. Aortic Atherosclerosis (ICD10-I70.0) and Emphysema (ICD10-J43.9). Electronically Signed   By: Morgane  Naveau M.D.   On: 03/29/2024 18:53     .Critical Care  Performed by: Hildegard Loge, PA-C Authorized by: Hildegard Loge, PA-C   Critical care provider statement:  Critical care time (minutes):  76   Critical care was necessary to treat or prevent imminent or life-threatening deterioration of the following conditions:  Sepsis (Volume overload, continuous neb, magnesium , BiPAP)   Critical care was time spent personally by me on the following activities:  Development of treatment plan with patient or surrogate, discussions with consultants, evaluation of patient's response to treatment, examination of patient, ordering and review of laboratory studies, ordering and review of radiographic studies, ordering and performing treatments and interventions, pulse oximetry, re-evaluation of patient's condition and review of old charts   Care discussed with: admitting provider      Medications Ordered in the ED  albuterol  (PROVENTIL ) (2.5 MG/3ML) 0.083% nebulizer solution (10 mg/hr Nebulization Given 03/29/24 1811)  magnesium  sulfate IVPB 2 g 50 mL (0 g Intravenous Stopped 03/29/24 2022)  cefTRIAXone (ROCEPHIN) 1 g in sodium chloride  0.9 % 100 mL IVPB (0 g Intravenous Stopped 03/29/24 1911)  azithromycin  (ZITHROMAX ) 500 mg in sodium chloride  0.9 % 250 mL IVPB (0 mg Intravenous Stopped 03/29/24 2022)  lactated ringers bolus 1,000 mL (0 mLs Intravenous Stopped 03/29/24 1904)  furosemide  (LASIX ) injection  40 mg (40 mg Intravenous Given 03/29/24 1935)    Clinical Course as of 03/29/24 2149  Thu Mar 29, 2024  1900 Patient received about 300 mL of fluid bolus when his BNP resulted which was 1500.  Fluid bolus was stopped at that point.  Due to concern of causing volume overload.  Although he is SIRS positive and his lactic acid is elevated unsure if this is coming from significant volume overload from CHF or true infection.  Nonetheless he got antibiotics to cover for community-acquired pneumonia IVP Lasix  for volume overload.  He is COVID-positive. [AA]  2148 Patient resting comfortably on BiPAP.  States he is feeling improved.  Spoke to hospitalist will evaluate patient for admission. [AA]    Clinical Course User Index [AA] Hildegard Loge, PA-C                                 Medical Decision Making Amount and/or Complexity of Data Reviewed Labs: ordered. Radiology: ordered.  Risk Prescription drug management.   Medical Decision Making / ED Course   This patient presents to the ED for concern of respiratory distress, this involves an extensive number of treatment options, and is a complaint that carries with it a high risk of complications and morbidity.  The differential diagnosis includes CHF exacerbation, COPD exacerbation, sepsis, pneumonia, viral URI  MDM: 73 year old male presents today for concern of respiratory distress.  He was hypoxic on arrival.  He received continuous nebulizer with some improvement however he later became hypoxic again.  He was placed on BiPAP.  He does have signs of volume overload so BiPAP with positive pressure will be helpful for him. His temperature is 99.8 rectally.  Leukopenia present.  BNP elevated to 1500.  Lactic acid initially elevated but resolved on repeat.  Potassium of 3.2 otherwise without acute concern on the BMP.  COVID-positive.  VBG without acute concern.  Chest x-ray with pulmonary vascular congestion.  EKG without acute ischemic  change.  COPD exacerbation treatment provided with magnesium , Solu-Medrol , and breathing treatment.  Placed on BiPAP.  Mixed picture between COPD exacerbation, and CHF exacerbation.  Will discuss with hospitalist for admission.  Discussed with hospitalist will evaluate patient for admission.  Additional history obtained: -Additional history obtained from chart review he is on  chronic supplemental O2 from CHF and COPD -External records from outside source obtained and reviewed including: Chart review including previous notes, labs, imaging, consultation notes   Lab Tests: -I ordered, reviewed, and interpreted labs.   The pertinent results include:   Labs Reviewed  RESP PANEL BY RT-PCR (RSV, FLU A&B, COVID)  RVPGX2 - Abnormal; Notable for the following components:      Result Value   SARS Coronavirus 2 by RT PCR POSITIVE (*)    All other components within normal limits  BASIC METABOLIC PANEL WITH GFR - Abnormal; Notable for the following components:   Potassium 3.2 (*)    Chloride 92 (*)    Glucose, Bld 111 (*)    BUN 7 (*)    Calcium 8.7 (*)    All other components within normal limits  CBC WITH DIFFERENTIAL/PLATELET - Abnormal; Notable for the following components:   WBC 2.2 (*)    MCV 101.1 (*)    MCHC 29.9 (*)    Platelets 109 (*)    Neutro Abs 1.2 (*)    Lymphs Abs 0.4 (*)    All other components within normal limits  BRAIN NATRIURETIC PEPTIDE - Abnormal; Notable for the following components:   B Natriuretic Peptide 1,440.7 (*)    All other components within normal limits  PROTIME-INR - Abnormal; Notable for the following components:   Prothrombin Time 15.5 (*)    All other components within normal limits  I-STAT CG4 LACTIC ACID, ED - Abnormal; Notable for the following components:   Lactic Acid, Venous 2.5 (*)    All other components within normal limits  I-STAT VENOUS BLOOD GAS, ED - Abnormal; Notable for the following components:   pH, Ven 7.482 (*)     Bicarbonate 34.7 (*)    TCO2 36 (*)    Acid-Base Excess 10.0 (*)    Potassium 3.1 (*)    Calcium, Ion 0.99 (*)    All other components within normal limits  CULTURE, BLOOD (ROUTINE X 2)  CULTURE, BLOOD (ROUTINE X 2)  URINALYSIS, ROUTINE W REFLEX MICROSCOPIC  I-STAT CG4 LACTIC ACID, ED      EKG  EKG Interpretation Date/Time:  Thursday March 29 2024 17:44:57 EDT Ventricular Rate:  94 PR Interval:    QRS Duration:  121 QT Interval:  369 QTC Calculation: 462 R Axis:   262  Text Interpretation: Paired ventricular premature complexes Nonspecific IVCD with LAD Nonspecific T abnrm, anterolateral leads Interpretation limited secondary to artifact Confirmed by Elnor Savant ([id number]) on 03/29/2024 6:10:15 PM         Imaging Studies ordered: I ordered imaging studies including chest x-ray I independently visualized and interpreted imaging. I agree with the radiologist interpretation   Medicines ordered and prescription drug management: Meds ordered this encounter  Medications  . DISCONTD: methylPREDNISolone  sodium succinate (SOLU-MEDROL ) 125 mg/2 mL injection 125 mg    IV methylprednisolone  will be converted to either a q12h or q24h frequency with the same total daily dose (TDD).  Ordered Dose: 1 to 125 mg TDD; convert to: TDD q24h.  Ordered Dose: 126 to 250 mg TDD; convert to: TDD div q12h.  Ordered Dose: >250 mg TDD; DAW.  . albuterol  (PROVENTIL ) (2.5 MG/3ML) 0.083% nebulizer solution  . magnesium  sulfate IVPB 2 g 50 mL  . cefTRIAXone (ROCEPHIN) 1 g in sodium chloride  0.9 % 100 mL IVPB    Antibiotic Indication::   CAP  . azithromycin  (ZITHROMAX ) 500 mg in sodium chloride  0.9 % 250 mL IVPB  Antibiotic Indication::   CAP  . lactated ringers bolus 1,000 mL  . furosemide  (LASIX ) injection 40 mg    -I have reviewed the patients home medicines and have made adjustments as needed  Critical interventions IV magnesium , continuous neb, IVP Lasix , BiPAP, hypoxic  Cardiac  Monitoring: The patient was maintained on a cardiac monitor.  I personally viewed and interpreted the cardiac monitored which showed an underlying rhythm of: Sinus tachycardia  Reevaluation: After the interventions noted above, I reevaluated the patient and found that they have :stayed the same  Co morbidities that complicate the patient evaluation . Past Medical History:  Diagnosis Date  . CHF (congestive heart failure) (HCC)   . COPD (chronic obstructive pulmonary disease) (HCC)   . Diabetes mellitus without complication (HCC)   . Dyspnea   . Hypertension       Dispostion: Discussed with hospitalist will evaluate patient for admission   Final diagnoses:  Acute respiratory failure with hypoxia [facility])  COVID-19    ED Discharge Orders     None          Hildegard Loge, PA-C 03/29/24 2142    Elnor Savant A, DO 04/01/24 1514

## 2024-03-29 NOTE — Sepsis Progress Note (Signed)
 Elink monitoring for the code sepsis protocol.

## 2024-03-29 NOTE — ED Triage Notes (Signed)
 Pt BIb [facility] from home for SOB x 2 weeks worsening over last 2 days. Chills 2 days ago. Not warm to touch at this time. PT is A&O.   Diminished Breath sounds Duoneb, 125 solumedrol 2L [place] @ 88-90% B[place]eline.  Pt is at baseline.  172/82 HR 120 W/ PVC's, CBG 121  18G L. AC

## 2024-03-29 NOTE — ED Notes (Signed)
 Upon primary assessment of patient, patient found to be in 70s SpO2 on 5L [place]. Pt short of breath and put on NRB. EDP notified. RT called for bipap.

## 2024-03-29 NOTE — H&P (Signed)
 History and Physical    Mitchell King FMW:[id number] DOB: 1950-07-11 DOA: 03/29/2024  PCP: Gil Greig BRAVO, NP   Patient coming from: Home   Chief Complaint:  Chief Complaint  Patient presents with   Shortness of Breath   ED TRIAGE note:Pt BIb [facility] from home for SOB x 2 weeks worsening over last 2 days. Chills 2 days ago. Not warm to touch at this time. PT is A&O.    Diminished Breath sounds Duoneb, 125 solumedrol 2L [place] @ 88-90% B[place]eline.  Pt is at baseline.  172/82 HR 120 W/ PVC's, CBG 121    HPI:  Mitchell King is a 73 y.o. male with medical history significant of CHF, COPD, essential hypertension, non-insulin -dependent DM type II, and chronic anemia presented to emergency department complaining of worsening shortness of breath for 2 weeks, fever and chill for 2 days.  During my evaluation at the bedside patient reported shortness of breath has been improved since he has been placed on BiPAP.  Denies any cough, chest pain, nausea, vomiting, and diarrhea.  Patient is concern for contracted COVID from family member.   ED Course:  At presentation to ED O2 sat 75% on room air initially was on nonrebreather and was will on nonrebreather O2 sat dropped to 76% and transition to BiPAP maintaining 100%.  Hemodynamically stable.  Afebrile.  Lab work, initial lactic acid 2.5 improved to 1.8. CBC showing low WBC count 2.2, normal H&H and low platelet count 109. BMP shows low potassium 3.2 low chloride 92, otherwise unremarkable Elevated BNP 1440. Normal pro time INR. VBG showing elevated pH 7.48, normal pCO2 and pO2 level.  Respiratory panel positive with COVID. Blood cultures are in process.  Chest x-ray showing cardiomegaly with pulmonary vascular congestion.  Aortic atherosclerosis and emphysema.  In the ED patient received albuterol , azithromycin , ceftriaxone, Lasix , 1 L of LR bolus and mag sulfate.  This patient has a mixed picture of sepsis in the setting of COVID-19  infection, COPD exacerbation at the same time he has CHF exacerbation as well.  In the ED patient initially received 1 L of LR bolus given lactic acid was high however started desatting with nonrebreather and requiring BiPAP and received Lasix  40 mg.  Currently maintaining O2 sat 100%.   Hospitalist has been consulted for further evaluation management of acute hypoxic respiratory failure in the setting of COPD exacerbation, COVID-19 infection, acute on chronic CHF exacerbation and hypokalemia.   Significant labs in the ED: Lab Orders         Resp panel by RT-PCR (RSV, Flu A&B, Covid) Anterior Nasal Swab         Blood Culture (routine x 2)         Expectorated Sputum Assessment w Gram Stain, Rflx to Resp Cult         Basic metabolic panel         CBC with Differential         Brain natriuretic peptide         Protime-INR         Urinalysis, Routine w reflex microscopic -Urine, Clean Catch         Hemoglobin A1c         Comprehensive metabolic panel         CBC         Legionella Pneumophila Serogp 1 Ur Ag         Strep pneumoniae urinary antigen  I-Stat Lactic Acid, ED         I-Stat venous blood gas, ([facility], [facility], [facility])         CBG monitoring, ED       Review of Systems:  Review of Systems  Constitutional:  Positive for chills and fever. Negative for malaise/fatigue and weight loss.  Respiratory:  Positive for shortness of breath. Negative for cough, hemoptysis, sputum production and wheezing.   Cardiovascular:  Negative for chest pain and palpitations.  Gastrointestinal:  Negative for diarrhea, heartburn, nausea and vomiting.  Musculoskeletal:  Negative for myalgias and neck pain.  Neurological:  Negative for dizziness and headaches.  Psychiatric/Behavioral:  The patient is not nervous/anxious.     Past Medical History:  Diagnosis Date   CHF (congestive heart failure) (HCC)    COPD (chronic obstructive pulmonary disease) (HCC)    Diabetes mellitus without complication  (HCC)    Dyspnea    Hypertension     Past Surgical History:  Procedure Laterality Date   NO PAST SURGERIES       reports that he quit smoking about 3 years ago. His smoking use included cigarettes. He started smoking about 55 years ago. He has a 78 pack-year smoking history. He has never used smokeless tobacco. He reports that he does not drink alcohol and does not use drugs.  No Known Allergies  Family History  Problem Relation Age of Onset   Heart attack Brother     Prior to Admission medications   Medication Sig Start Date End Date Taking? Authorizing Provider  acetaminophen  (TYLENOL ) 500 MG tablet Take 1,000 mg by mouth every 8 (eight) hours as needed for mild pain or headache (or headaches).    [provider]  albuterol  (VENTOLIN  HFA) 108 (90 Base) MCG/ACT inhaler Inhale 2 puffs into the lungs every 4 (four) hours as needed for wheezing or shortness of breath. 09/04/20   Gil Greig BRAVO, NP  ferrous sulfate  325 (65 FE) MG tablet Take 1 tablet (325 mg total) by mouth daily with breakfast. 04/18/21 06/30/21  Lenon Marien CROME, MD  ipratropium-albuterol  (DUONEB) 0.5-2.5 (3) MG/3ML SOLN Take 3 mLs by nebulization every 4 (four) hours as needed. 09/04/20   Fargo, Amy E, NP  metoprolol  tartrate (LOPRESSOR ) 25 MG tablet Take 1 tablet (25 mg total) by mouth 2 (two) times daily. 04/17/21 06/30/21  Lenon Marien CROME, MD  tamsulosin  (FLOMAX ) 0.4 MG CAPS capsule Take 1 capsule by mouth daily. 10/31/20   [provider]  Tiotropium Bromide  Monohydrate (SPIRIVA  RESPIMAT) 2.5 MCG/ACT AERS Inhale 1 puff into the lungs in the morning and at bedtime.    [provider]  Tiotropium Bromide -Olodaterol (STIOLTO RESPIMAT ) 2.5-2.5 MCG/ACT AERS Inhale 2 puffs into the lungs daily. 06/30/21   Kara Dorn NOVAK, MD  torsemide  (DEMADEX ) 20 MG tablet Take 1 tablet (20 mg total) by mouth daily as needed. For a weight over 216lbs, take a tablet of torsemide . If needing a tablet more than  2 days in a row, call your primary doctor. 04/17/21 06/30/21  Lenon Marien CROME, MD  Fluticasone -Salmeterol (ADVAIR DISKUS) 250-50 MCG/DOSE AEPB Inhale 1 puff into the lungs 2 (two) times daily. 11/20/11 10/11/13  Sonda Alm BROCKS, MD     Physical Exam: Vitals:   03/29/24 2022 03/29/24 2030 03/29/24 2100 03/30/24 0000  BP:  137/77 (!) 151/82   Pulse:  65 99   Resp:  (!) 24 16 (!) 21  Temp: 99.8 F (37.7 C)     TempSrc:  Rectal     SpO2:  100% 100%     Physical Exam Constitutional:      Appearance: He is well-developed.  Eyes:     Pupils: Pupils are equal, round, and reactive to light.  Cardiovascular:     Rate and Rhythm: Normal rate and regular rhythm.  Pulmonary:     Effort: Pulmonary effort is normal. No tachypnea.     Breath sounds: Wheezing present. No decreased breath sounds, rhonchi or rales.  Musculoskeletal:     Cervical back: Normal range of motion.     Right lower leg: No edema.     Left lower leg: No edema.  Skin:    Capillary Refill: Capillary refill takes less than 2 seconds.  Neurological:     Mental Status: He is alert and oriented to person, place, and time.      Labs on Admission: I have personally reviewed following labs and imaging studies  CBC: Recent Labs  Lab 03/29/24 1757 03/29/24 1810  WBC 2.2*  --   NEUTROABS 1.2*  --   HGB 13.7 16.7  HCT 45.8 49.0  MCV 101.1*  --   PLT 109*  --    Basic Metabolic Panel: Recent Labs  Lab 03/29/24 1757 03/29/24 1810  NA 136 137  K 3.2* 3.1*  CL 92*  --   CO2 31  --   GLUCOSE 111*  --   BUN 7*  --   CREATININE 1.14  --   CALCIUM 8.7*  --    GFR: CrCl cannot be calculated (Unknown ideal weight.). Liver Function Tests: No results for input(s): AST, ALT, ALKPHOS, BILITOT, PROT, ALBUMIN in the last 168 hours. No results for input(s): LIPASE, AMYLASE in the last 168 hours. No results for input(s): AMMONIA in the last 168 hours. Coagulation Profile: Recent Labs  Lab  03/29/24 1803  INR 1.2   Cardiac Enzymes: No results for input(s): CKTOTAL, CKMB, CKMBINDEX, TROPONINI, TROPONINIHS in the last 168 hours. BNP (last 3 results) Recent Labs    03/29/24 1757  BNP 1,440.7*   HbA1C: Recent Labs    03/29/24 2201  HGBA1C 5.6   CBG: Recent Labs  Lab 03/29/24 2201  GLUCAP 131*   Lipid Profile: No results for input(s): CHOL, HDL, LDLCALC, TRIG, CHOLHDL, LDLDIRECT in the last 72 hours. Thyroid Function Tests: No results for input(s): TSH, T4TOTAL, FREET4, T3FREE, THYROIDAB in the last 72 hours. Anemia Panel: No results for input(s): VITAMINB12, FOLATE, FERRITIN, TIBC, IRON, RETICCTPCT in the last 72 hours. Urine analysis:    Component Value Date/Time   COLORURINE YELLOW 03/29/2024 2205   APPEARANCEUR CLEAR 03/29/2024 2205   LABSPEC 1.009 03/29/2024 2205   PHURINE 5.0 03/29/2024 2205   GLUCOSEU >=500 (A) 03/29/2024 2205   HGBUR MODERATE (A) 03/29/2024 2205   BILIRUBINUR NEGATIVE 03/29/2024 2205   BILIRUBINUR neg 04/20/2017 1447   KETONESUR NEGATIVE 03/29/2024 2205   PROTEINUR 100 (A) 03/29/2024 2205   UROBILINOGEN 1.0 04/20/2017 1447   NITRITE NEGATIVE 03/29/2024 2205   LEUKOCYTESUR NEGATIVE 03/29/2024 2205    Radiological Exams on Admission: I have personally reviewed images DG Chest Port 1 View Result Date: 03/29/2024 CLINICAL DATA:  dib short of breath. EXAM: PORTABLE CHEST 1 VIEW COMPARISON:  Chest x-ray 06/13/2021 FINDINGS: The heart and mediastinal contours are unchanged. Atherosclerotic plaque. Prominent hilar vasculature. Bibasilar atelectasis. No focal consolidation. Chronic coarsened interstitial markings with no overt pulmonary edema. No pleural effusion. No pneumothorax. No acute osseous abnormality. IMPRESSION: 1. Cardiomegaly with pulmonary venous congestion.  2. Aortic Atherosclerosis (ICD10-I70.0) and Emphysema (ICD10-J43.9). Electronically Signed   By: Morgane  Naveau M.D.   On:  03/29/2024 18:53     EKG: My personal interpretation of EKG shows: Normal sinus rhythm heart rate 98.  Ventricular bigeminy.    Assessment/Plan: Principal Problem:   Acute hypoxic respiratory failure (HCC) Active Problems:   COPD with acute exacerbation (HCC)   COVID-19 virus infection   Acute exacerbation of CHF (congestive heart failure) (HCC)   Essential hypertension   Non-insulin  dependent type 2 diabetes mellitus (HCC)    Assessment and Plan: Acute hypoxic respiratory failure in the setting of COVID-19 infection,  Sepsis in the setting of COVID-19 infection Acute COPD exacerbation Community-acquired pneumonia -Presented emergency department complaining of progressively worsening shortness of breath, fever chills and cough for last 1 week.  Patient found COVID-positive have elevated lactic acidosis and low WBC count.  Chest x-ray showing evidence of pulmonary vascular congestion and edema.  Elevated BNP 1440.   In the ED O2 sat dropped 75% on nonrebreather requiring BiPAP.  Patient has mixed picture of sepsis in the setting of COVID-19 infection at the same time patient has acute on chronic CHF consideration and COPD exacerbation. -Given patient has acute hypoxic respiratory plan requiring BiPAP pharmacy recommended baricitinib rather than remdesivir. - Continue IV Decadron and oral baricitinib. -Given patient has COVID-19 infection and concern for underlying pneumonia as well continue IV ceftriaxone azithromycin .   Need to follow-up with blood culture, sputum culture, urine Legionella and urine strep antigen test. - Continue to treat with COPD with DuoNeb every 6 hours scheduled, currently on IV Decadron 6 mg, DuoNeb as needed and continue BiPAP. -Continue supportive care. -Continue BiPAP for like 6 hours and will transition to nasal cannula oxygen  once appropriate.   Acute on chronic CHF exacerbation Essential hypertension -At home patient does not take any medications at  all.  He is used to be on Lopressor  and torsemide .  Given his noncompliance with Lopressor  since 2023 and unknown recent ejection fraction holding off either initiating of Lopressor . - Elevated BNP1440. -Chest x-ray pulmonary vascular congestion and pulmonary edema - Patient received Lasix  40 mg IV in the ED and currently on BiPAP.  In the setting of sepsis holding further diuretics. -Based on patient volume status and respiratory status will continue IV Lasix . - Strict I's/O, daily weight and monitor urine output. -Obtaining echocardiogram.  Based on echocardiogram result can initiate GDMT guided heart failure medications once appropriate.   Non-insulin -dependent DM type II -Continue sliding scale insulin  with mealtime coverage.   Hypokalemia - Replating with IV KCl.    DVT prophylaxis:  IV Lovenox , SCD TED hose Code Status:  Full code Diet: Heart healthy carb modified diet Disposition Plan: Continue monitor improvement of shortness of breath.  Need to follow-up with echocardiogram. Consults: Pharmacy-for COVID-19 infection Admission status:   Inpatient, Step Down Unit  Severity of Illness: The appropriate patient status for this patient is INPATIENT. Inpatient status is judged to be reasonable and necessary in order to provide the required intensity of service to ensure the patient's safety. The patient's presenting symptoms, physical exam findings, and initial radiographic and laboratory data in the context of their chronic comorbidities is felt to place them at high risk for further clinical deterioration. Furthermore, it is not anticipated that the patient will be medically stable for discharge from the hospital within 2 midnights of admission.   * I certify that at the point of admission it is my clinical judgment that  the patient will require inpatient hospital care spanning beyond 2 midnights from the point of admission due to high intensity of service, high risk for further  deterioration and high frequency of surveillance required.DEWAINE    Vylette Strubel, MD [facility]  How to contact the [facility] Attending or Consulting provider 7A - 7P or covering provider during after hours 7P -7A, for this patient.  Check the care team in [facility] and look for a) attending/consulting [facility] provider listed and b) the [facility] team listed Log into [email or web address] and use [facility] universal password to access. If you do not have the password, please contact the hospital operator. Locate the [facility] provider you are looking for under [facility] and page to a number that you can be directly reached. If you still have difficulty reaching the provider, please page the [facility] (Director on Call) for the Hospitalists listed on amion for assistance.  03/30/2024, 1:57 AM

## 2024-03-30 ENCOUNTER — Inpatient Hospital Stay (HOSPITAL_COMMUNITY)

## 2024-03-30 DIAGNOSIS — J9601 Acute respiratory failure with hypoxia: Secondary | ICD-10-CM | POA: Diagnosis not present

## 2024-03-30 DIAGNOSIS — I5033 Acute on chronic diastolic (congestive) heart failure: Secondary | ICD-10-CM

## 2024-03-30 LAB — COMPREHENSIVE METABOLIC PANEL WITH GFR
ALT: 11 U/L (ref 0–44)
AST: 28 U/L (ref 15–41)
Albumin: 3.5 g/dL (ref 3.5–5.0)
Alkaline Phosphatase: 45 U/L (ref 38–126)
Anion gap: 14 (ref 5–15)
BUN: 7 mg/dL — ABNORMAL LOW (ref 8–23)
CO2: 33 mmol/L — ABNORMAL HIGH (ref 22–32)
Calcium: 8.5 mg/dL — ABNORMAL LOW (ref 8.9–10.3)
Chloride: 92 mmol/L — ABNORMAL LOW (ref 98–111)
Creatinine, Ser: 1.24 mg/dL (ref 0.61–1.24)
GFR, Estimated: >60 mL/min (ref >60)
Glucose, Bld: 147 mg/dL — ABNORMAL HIGH (ref 70–99)
Potassium: 3 mmol/L — ABNORMAL LOW (ref 3.5–5.1)
Sodium: 139 mmol/L (ref 135–145)
Total Bilirubin: 2.1 mg/dL — ABNORMAL HIGH (ref 0.0–1.2)
Total Protein: 7.5 g/dL (ref 6.5–8.1)

## 2024-03-30 LAB — CBC
HCT: 45.1 % (ref 39.0–52.0)
Hemoglobin: 13.6 g/dL (ref 13.0–17.0)
MCH: 30.1 pg (ref 26.0–34.0)
MCHC: 30.2 g/dL (ref 30.0–36.0)
MCV: 99.8 fL (ref 80.0–100.0)
Platelets: 119 K/uL — ABNORMAL LOW (ref 150–400)
RBC: 4.52 MIL/uL (ref 4.22–5.81)
RDW: 15 % (ref 11.5–15.5)
WBC: 2.6 K/uL — ABNORMAL LOW (ref 4.0–10.5)
nRBC: 0 % (ref 0.0–0.2)

## 2024-03-30 LAB — FERRITIN: Ferritin: 226 ng/mL (ref 24–336)

## 2024-03-30 LAB — GLUCOSE, CAPILLARY
Glucose-Capillary: 127 mg/dL — ABNORMAL HIGH (ref 70–99)
Glucose-Capillary: 148 mg/dL — ABNORMAL HIGH (ref 70–99)
Glucose-Capillary: 113 mg/dL — ABNORMAL HIGH (ref 70–99)

## 2024-03-30 LAB — C-REACTIVE PROTEIN: CRP: 0.9 mg/dL (ref <1.0)

## 2024-03-30 LAB — ECHOCARDIOGRAM COMPLETE
Area-P 1/2: 4.39 cm2
S' Lateral: 3.1 cm

## 2024-03-30 LAB — CBG MONITORING, ED
Glucose-Capillary: 133 mg/dL — ABNORMAL HIGH (ref 70–99)
Glucose-Capillary: 119 mg/dL — ABNORMAL HIGH (ref 70–99)

## 2024-03-30 MED ORDER — POTASSIUM CHLORIDE 20 MEQ PO PACK
40.0000 meq | PACK | Freq: Two times a day (BID) | ORAL | Status: AC
  Administered 2024-03-30 (×2): 40 meq via ORAL
  Filled 2024-03-30 (×2): qty 2

## 2024-03-30 MED ORDER — FUROSEMIDE 10 MG/ML IJ SOLN
40.0000 mg | Freq: Two times a day (BID) | INTRAMUSCULAR | Status: DC
  Administered 2024-03-30 – 2024-03-31 (×3): 40 mg via INTRAVENOUS
  Filled 2024-03-30 (×3): qty 4

## 2024-03-30 MED ORDER — PANTOPRAZOLE SODIUM 40 MG PO TBEC
40.0000 mg | DELAYED_RELEASE_TABLET | Freq: Every day | ORAL | Status: DC
  Administered 2024-03-30 – 2024-04-05 (×7): 40 mg via ORAL
  Filled 2024-03-30 (×3): qty 1
  Filled 2024-03-30: qty 2
  Filled 2024-03-30 (×4): qty 1

## 2024-03-30 NOTE — Plan of Care (Signed)
  Problem: Coping: Goal: Ability to adjust to condition or change in health will improve 03/30/2024 1704 by Sherlynn Clotilda BRAVO, RN Outcome: Progressing 03/30/2024 1704 by Sherlynn Clotilda BRAVO, RN Outcome: Progressing

## 2024-03-30 NOTE — Progress Notes (Addendum)
 PROGRESS NOTE    Mitchell King  FMW:[id number] DOB: 11-25-1950 DOA: 03/29/2024 PCP: No primary care provider on file.  73/M with history of diastolic CHF, pulmonary hypertension, COPD, type 2 diabetes mellitus, chronic anemia presented to the ED with worsening shortness of breath for 2 weeks and chills for 2 days . - Initial lactate 2.5, improved to 1.8, WBC 2.2, BNP 1440, chest x-ray with pulmonary vascular congestion and emphysema, respiratory panel positive for COVID  -Initially given a liter fluid bolus in the ER, developed worsening respiratory distress, placed on BiPAP - Admitted, placed on BiPAP, started on diuretics as well as steroids and baricitinib for COVID  Subjective: -Feels better, breathing is improving  Assessment and Plan:  Acute hypoxic respiratory failure Acute on chronic diastolic CHF, moderate pulmonary hypertension - Clinically volume overloaded, reports that he is no longer on diuretics for some reason despite med list including torsemide  -Echo now with preserved EF, moderate LVH, moderately elevated PA systolic pressures, RV pressure and volume overload - Start IV Lasix  40 mg twice daily - Add Aldactone  tomorrow - Evaluate for SGLT2i  SARS COVID-19 infection -I do not think his hypoxia or respiratory distress were secondary to COVID, CRP and ferritin are normal -DC baricitinib, continue Decadron today  Non-insulin -dependent DM type II -Continue sliding scale insulin  with mealtime coverage.   Hypokalemia - Replating with IV KCl.   COPD -Continue DuoNebs   DVT prophylaxis:  Lovenox , SCD TED hose Code Status:  Full code Family Communication: Discussed with patient detail, no family at bedside Disposition Plan: Home pending clinical improvement likely 2 to 3 days  Consultants:    Procedures:   Antimicrobials:    Objective: Vitals:   03/30/24 0307 03/30/24 0310 03/30/24 0500 03/30/24 0744  BP: (!) 164/129 (!) 164/129  (!) 161/91  Pulse:  (!)  59  82  Resp:  (!) 21  16  Temp:  98.6 F (37 C)  98.1 F (36.7 C)  TempSrc:  Oral    SpO2:  95% 94% 100%    Intake/Output Summary (Last 24 hours) at 03/30/2024 1224 Last data filed at 03/29/2024 2349 Gross per 24 hour  Intake 756.76 ml  Output --  Net 756.76 ml   There were no vitals filed for this visit.  Examination:  General exam: Clearly elderly male AO x 3 HEENT: Positive JVD Respiratory system: Poor air movement, few basilar rales Cardiovascular system: S1 & S2 heard, RRR.  Abd: nondistended, soft and nontender.Normal bowel sounds heard. Central nervous system: Alert and oriented. No focal neurological deficits. Extremities: 2+ edema Skin: No rashes Psychiatry:  Mood & affect appropriate.     Data Reviewed:   CBC: Recent Labs  Lab 03/29/24 1757 03/29/24 1810 03/30/24 0500  WBC 2.2*  --  2.6*  NEUTROABS 1.2*  --   --   HGB 13.7 16.7 13.6  HCT 45.8 49.0 45.1  MCV 101.1*  --  99.8  PLT 109*  --  119*   Basic Metabolic Panel: Recent Labs  Lab 03/29/24 1757 03/29/24 1810 03/30/24 0500  NA 136 137 139  K 3.2* 3.1* 3.0*  CL 92*  --  92*  CO2 31  --  33*  GLUCOSE 111*  --  147*  BUN 7*  --  7*  CREATININE 1.14  --  1.24  CALCIUM 8.7*  --  8.5*   GFR: CrCl cannot be calculated (Unknown ideal weight.). Liver Function Tests: Recent Labs  Lab 03/30/24 0500  AST 28  ALT  11  ALKPHOS 45  BILITOT 2.1*  PROT 7.5  ALBUMIN 3.5   No results for input(s): LIPASE, AMYLASE in the last 168 hours. No results for input(s): AMMONIA in the last 168 hours. Coagulation Profile: Recent Labs  Lab 03/29/24 1803  INR 1.2   Cardiac Enzymes: No results for input(s): CKTOTAL, CKMB, CKMBINDEX, TROPONINI in the last 168 hours. BNP (last 3 results) No results for input(s): PROBNP in the last 8760 hours. HbA1C: Recent Labs    03/29/24 2201  HGBA1C 5.6   CBG: Recent Labs  Lab 03/29/24 2201 03/30/24 0733  GLUCAP 131* 133*   Lipid  Profile: No results for input(s): CHOL, HDL, LDLCALC, TRIG, CHOLHDL, LDLDIRECT in the last 72 hours. Thyroid Function Tests: No results for input(s): TSH, T4TOTAL, FREET4, T3FREE, THYROIDAB in the last 72 hours. Anemia Panel: Recent Labs    03/30/24 0600  FERRITIN 226   Urine analysis:    Component Value Date/Time   COLORURINE YELLOW 03/29/2024 2205   APPEARANCEUR CLEAR 03/29/2024 2205   LABSPEC 1.009 03/29/2024 2205   PHURINE 5.0 03/29/2024 2205   GLUCOSEU >=500 (A) 03/29/2024 2205   HGBUR MODERATE (A) 03/29/2024 2205   BILIRUBINUR NEGATIVE 03/29/2024 2205   BILIRUBINUR neg 04/20/2017 1447   KETONESUR NEGATIVE 03/29/2024 2205   PROTEINUR 100 (A) 03/29/2024 2205   UROBILINOGEN 1.0 04/20/2017 1447   NITRITE NEGATIVE 03/29/2024 2205   LEUKOCYTESUR NEGATIVE 03/29/2024 2205   Sepsis Labs: @LABRCNTIP (procalcitonin:4,lacticidven:4)  ) Recent Results (from the past 240 hours)  Resp panel by RT-PCR (RSV, Flu A&B, Covid) Anterior Nasal Swab     Status: Abnormal   Collection Time: 03/29/24  6:12 PM   Specimen: Anterior Nasal Swab  Result Value Ref Range Status   SARS Coronavirus 2 by RT PCR POSITIVE (A) NEGATIVE Final   Influenza A by PCR NEGATIVE NEGATIVE Final   Influenza B by PCR NEGATIVE NEGATIVE Final    Comment: (NOTE) The Xpert Xpress SARS-CoV-2/FLU/RSV plus assay is intended as an aid in the diagnosis of influenza from Nasopharyngeal swab specimens and should not be used as a sole basis for treatment. Nasal washings and aspirates are unacceptable for Xpert Xpress SARS-CoV-2/FLU/RSV testing.  Fact Sheet for Patients: BloggerCourse.com  Fact Sheet for Healthcare Providers: SeriousBroker.it  This test is not yet approved or cleared by the [place]  FDA and has been authorized for detection and/or diagnosis of SARS-CoV-2 by FDA under an Emergency Use Authorization (EUA). This EUA will remain in  effect (meaning this test can be used) for the duration of the COVID-19 declaration under Section 564(b)(1) of the Act, 21 U.S.C. section 360bbb-3(b)(1), unless the authorization is terminated or revoked.     Resp Syncytial Virus by PCR NEGATIVE NEGATIVE Final    Comment: (NOTE) Fact Sheet for Patients: BloggerCourse.com  Fact Sheet for Healthcare Providers: SeriousBroker.it  This test is not yet approved or cleared by the [place]  FDA and has been authorized for detection and/or diagnosis of SARS-CoV-2 by FDA under an Emergency Use Authorization (EUA). This EUA will remain in effect (meaning this test can be used) for the duration of the COVID-19 declaration under Section 564(b)(1) of the Act, 21 U.S.C. section 360bbb-3(b)(1), unless the authorization is terminated or revoked.  Performed at [facility], [address], [place], [place] [address]   Blood Culture (routine x 2)     Status: None (Preliminary result)   Collection Time: 03/29/24  7:02 PM   Specimen: BLOOD RIGHT ARM  Result Value Ref Range  Status   Specimen Description BLOOD RIGHT ARM  Final   Special Requests   Final    BOTTLES DRAWN AEROBIC AND ANAEROBIC Blood Culture adequate volume   Culture   Final    NO GROWTH < 12 HOURS Performed at [facility], [address] [place], [place] [address]    Report Status PENDING  Incomplete  Blood Culture (routine x 2)     Status: None (Preliminary result)   Collection Time: 03/29/24  7:02 PM   Specimen: BLOOD LEFT ARM  Result Value Ref Range Status   Specimen Description BLOOD LEFT ARM  Final   Special Requests   Final    BOTTLES DRAWN AEROBIC AND ANAEROBIC Blood Culture adequate volume   Culture   Final    NO GROWTH < 12 HOURS Performed at [facility], [address], [place], [place] [address]    Report Status PENDING  Incomplete     Radiology Studies: ECHOCARDIOGRAM COMPLETE Result  Date: 03/30/2024    ECHOCARDIOGRAM REPORT   Patient Name:   NIKODEM LEADBETTER Date of Exam: 03/30/2024 Medical Rec #:  [id number]       Height:       73.0 in Accession #:    [id number]      Weight:       209.0 lb Date of Birth:  April 26, 1951        BSA:          2.192 m Patient Age:    73 years        BP:           178/99 mmHg Patient Gender: M               HR:           110 bpm. Exam Location:  Inpatient Procedure: 2D Echo, Cardiac Doppler and Color Doppler (Both Spectral and Color            Flow Doppler were utilized during procedure). Indications:    CHF  History:        Patient has prior history of Echocardiogram examinations, most                 recent 04/14/2021. CHF, COPD, Signs/Symptoms:Dyspnea; Risk                 Factors:Hypertension.  Sonographer:    Philomena Daring Referring Phys: SUBRINA SUNDIL IMPRESSIONS  1. Left ventricular ejection fraction, by estimation, is 60 to 65%. The left ventricle has normal function. The left ventricle has no regional wall motion abnormalities. There is moderate concentric left ventricular hypertrophy. Left ventricular diastolic parameters are indeterminate. There is the interventricular septum is flattened in systole and diastole, consistent with right ventricular pressure and volume overload.  2. Right ventricular systolic function is normal. The right ventricular size is moderately enlarged. There is moderately elevated pulmonary artery systolic pressure. The estimated right ventricular systolic pressure is 48.2 mmHg.  3. Left atrial size was mildly dilated.  4. Right atrial size was moderately dilated.  5. The mitral valve is grossly normal. Trivial mitral valve regurgitation. No evidence of mitral stenosis.  6. The aortic valve is grossly normal. There is mild calcification of the aortic valve. There is mild thickening of the aortic valve. Aortic valve regurgitation is trivial. Aortic valve sclerosis is present, with no evidence of aortic valve stenosis.  7. The inferior  vena cava is dilated in size with <50% respiratory variability, suggesting right  atrial pressure of 15 mmHg. Comparison(s): No significant change from prior study. Conclusion(s)/Recommendation(s): RV pressure/volume overload with elevated pulmonary pressures. FINDINGS  Left Ventricle: Left ventricular ejection fraction, by estimation, is 60 to 65%. The left ventricle has normal function. The left ventricle has no regional wall motion abnormalities. The left ventricular internal cavity size was normal in size. There is  moderate concentric left ventricular hypertrophy. The interventricular septum is flattened in systole and diastole, consistent with right ventricular pressure and volume overload. Left ventricular diastolic parameters are indeterminate. Right Ventricle: The right ventricular size is moderately enlarged. Right vetricular wall thickness was not well visualized. Right ventricular systolic function is normal. There is moderately elevated pulmonary artery systolic pressure. The tricuspid regurgitant velocity is 2.88 m/s, and with an assumed right atrial pressure of 15 mmHg, the estimated right ventricular systolic pressure is 48.2 mmHg. Left Atrium: Left atrial size was mildly dilated. Right Atrium: Right atrial size was moderately dilated. Pericardium: There is no evidence of pericardial effusion. Mitral Valve: The mitral valve is grossly normal. Trivial mitral valve regurgitation. No evidence of mitral valve stenosis. Tricuspid Valve: The tricuspid valve is not well visualized. Tricuspid valve regurgitation is mild . No evidence of tricuspid stenosis. Aortic Valve: The aortic valve is grossly normal. There is mild calcification of the aortic valve. There is mild thickening of the aortic valve. Aortic valve regurgitation is trivial. Aortic valve sclerosis is present, with no evidence of aortic valve stenosis. Pulmonic Valve: The pulmonic valve was not well visualized. Pulmonic valve regurgitation is  trivial. No evidence of pulmonic stenosis. Aorta: The aortic root and ascending aorta are structurally normal, with no evidence of dilitation. Venous: The inferior vena cava is dilated in size with less than 50% respiratory variability, suggesting right atrial pressure of 15 mmHg. IAS/Shunts: There is left bowing of the interatrial septum, suggestive of elevated right atrial pressure. The atrial septum is grossly normal.  LEFT VENTRICLE PLAX 2D LVIDd:         4.60 cm   Diastology LVIDs:         3.10 cm   LV e' medial:    6.74 cm/s LV PW:         1.20 cm   LV E/e' medial:  14.2 LV IVS:        1.50 cm   LV e' lateral:   6.64 cm/s LVOT diam:     2.30 cm   LV E/e' lateral: 14.4 LV SV:         67 LV SV Index:   31 LVOT Area:     4.15 cm  RIGHT VENTRICLE             IVC RV S prime:     10.20 cm/s  IVC diam: 3.40 cm TAPSE (M-mode): 2.0 cm LEFT ATRIUM             Index        RIGHT ATRIUM           Index LA diam:        3.70 cm 1.69 cm/m   RA Area:     28.60 cm LA Vol (A2C):   53.2 ml 24.27 ml/m  RA Volume:   101.00 ml 46.07 ml/m LA Vol (A4C):   43.2 ml 19.71 ml/m LA Biplane Vol: 50.8 ml 23.17 ml/m  AORTIC VALVE LVOT Vmax:   98.00 cm/s LVOT Vmean:  64.500 cm/s LVOT VTI:    0.162 m  AORTA Ao Root diam: 3.30 cm  Ao Asc diam:  3.50 cm MITRAL VALVE               TRICUSPID VALVE MV Area (PHT): 4.39 cm    TR Peak grad:   33.2 mmHg MV Decel Time: 173 msec    TR Vmax:        288.00 cm/s MV E velocity: 95.40 cm/s MV A velocity: 74.40 cm/s  SHUNTS MV E/A ratio:  1.28        Systemic VTI:  0.16 m                            Systemic Diam: 2.30 cm Shelda Bruckner MD Electronically signed by Shelda Bruckner MD Signature Date/Time: 03/30/2024/11:56:14 AM    Final    DG Chest Port 1 View Result Date: 03/29/2024 CLINICAL DATA:  dib short of breath. EXAM: PORTABLE CHEST 1 VIEW COMPARISON:  Chest x-ray 06/13/2021 FINDINGS: The heart and mediastinal contours are unchanged. Atherosclerotic plaque. Prominent hilar  vasculature. Bibasilar atelectasis. No focal consolidation. Chronic coarsened interstitial markings with no overt pulmonary edema. No pleural effusion. No pneumothorax. No acute osseous abnormality. IMPRESSION: 1. Cardiomegaly with pulmonary venous congestion. 2. Aortic Atherosclerosis (ICD10-I70.0) and Emphysema (ICD10-J43.9). Electronically Signed   By: Morgane  Naveau M.D.   On: 03/29/2024 18:53     Scheduled Meds:  baricitinib  4 mg Oral Daily   dexamethasone (DECADRON) injection  6 mg Intravenous Q24H   enoxaparin  (LOVENOX ) injection  40 mg Subcutaneous Q24H   guaiFENesin   600 mg Oral BID   insulin  aspart  0-5 Units Subcutaneous QHS   insulin  aspart  0-6 Units Subcutaneous TID WC   ipratropium-albuterol   3 mL Nebulization Q6H   sodium chloride  flush  3 mL Intravenous Q12H   sodium chloride  flush  3 mL Intravenous Q12H   Continuous Infusions:  sodium chloride      azithromycin      cefTRIAXone (ROCEPHIN)  IV       LOS: 1 day    Time spent:    Sigurd Pac, MD [facility]   03/30/2024, 12:24 PM

## 2024-03-30 NOTE — Progress Notes (Signed)
 Patient weaned off BiPAP per MD.  Patient placed on 3L [place].

## 2024-03-30 NOTE — ED Notes (Signed)
 Taken off BiPAP by RT and placed on [place].

## 2024-03-31 DIAGNOSIS — J9601 Acute respiratory failure with hypoxia: Secondary | ICD-10-CM | POA: Diagnosis not present

## 2024-03-31 LAB — LEGIONELLA PNEUMOPHILA SEROGP 1 UR AG: L. pneumophila Serogp 1 Ur Ag: NEGATIVE

## 2024-03-31 LAB — COMPREHENSIVE METABOLIC PANEL WITH GFR
ALT: 11 U/L (ref 0–44)
AST: 34 U/L (ref 15–41)
Albumin: 3.7 g/dL (ref 3.5–5.0)
Alkaline Phosphatase: 44 U/L (ref 38–126)
Anion gap: 15 (ref 5–15)
BUN: 18 mg/dL (ref 8–23)
CO2: 34 mmol/L — ABNORMAL HIGH (ref 22–32)
Calcium: 8.6 mg/dL — ABNORMAL LOW (ref 8.9–10.3)
Chloride: 88 mmol/L — ABNORMAL LOW (ref 98–111)
Creatinine, Ser: 1.15 mg/dL (ref 0.61–1.24)
GFR, Estimated: >60 mL/min (ref >60)
Glucose, Bld: 121 mg/dL — ABNORMAL HIGH (ref 70–99)
Potassium: 3.9 mmol/L (ref 3.5–5.1)
Sodium: 137 mmol/L (ref 135–145)
Total Bilirubin: 2 mg/dL — ABNORMAL HIGH (ref 0.0–1.2)
Total Protein: 7.4 g/dL (ref 6.5–8.1)

## 2024-03-31 LAB — GLUCOSE, CAPILLARY
Glucose-Capillary: 100 mg/dL — ABNORMAL HIGH (ref 70–99)
Glucose-Capillary: 125 mg/dL — ABNORMAL HIGH (ref 70–99)
Glucose-Capillary: 117 mg/dL — ABNORMAL HIGH (ref 70–99)

## 2024-03-31 LAB — C-REACTIVE PROTEIN: CRP: 0.7 mg/dL (ref <1.0)

## 2024-03-31 MED ORDER — FLUTICASONE PROPIONATE 50 MCG/ACT NA SUSP
2.0000 | Freq: Every day | NASAL | Status: DC | PRN
  Administered 2024-03-31: 2 via NASAL
  Filled 2024-03-31: qty 16

## 2024-03-31 MED ORDER — DEXAMETHASONE 4 MG PO TABS
4.0000 mg | ORAL_TABLET | Freq: Every day | ORAL | Status: AC
  Administered 2024-03-31 – 2024-04-07 (×8): 4 mg via ORAL
  Filled 2024-03-31 (×8): qty 1

## 2024-03-31 NOTE — Progress Notes (Signed)
 [facility]   ROUNDING   NOTE TEAGUE GOYNES FMW:[id number]  DOB: 02-17-1951  DOA: 03/29/2024  PCP: No primary care provider on file.  03/31/2024,8:58 AM  LOS: 2 days    Code Status: Full code     from: Home   73 year old male known to have chronic respiratory failure secondary to COPD-prior tobacco-currently follows with Dr. 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 previously admitted with hyperammonemia 07/2020  Came in from home 10/9 with 2 weeks of worsening chills rigors diminished breath sounds given Solu-Medrol  in ED requiring more than 2 L of his baseline to maintain sats in the 90s-requiring 5 L and was placed on BiPAP ultimately weaned off of it in the ED Sodium 136 potassium 3.2 BUN/creatinine 7/1.1 GFR 60 BNP 1440 Lactic acid 2.2 cycling to 1.8 WBC 2.2 hemoglobin 13.7 platelet 109 differential normal Legionella pending expectorated sputum pending echo pending  COVID-positive but CRP 0.9-given a dose of remdesivir Decadron ceftriaxone and maintained only on    Assessment  & Plan :    Covid Likely slightly contributory to overall scenario-could have pushed the patient into multifactorial respiratory failure but given low CRP etc. would not aggressively control other than Decadron which I have switched to 4 mg daily for 10 days Followed to completion expectorated sputum and Legionella that are pending still-do not suspect any further work and would not get extended viral panel COPD/Emphysema Followed by Dr. Kara in the outpatient setting and chronically on 2 L I cut him back from 6 to 3 L today and we will see how he does with ambulation in the room with nursing Steroids should help with underlying airway disease as he is coughing a little bit I would watch him for fever or chills-continue Mucinex  600  twice daily DuoNebs 3 mL every 6, Flonase 2 spray as needed nasal congestion Acute superimposed on chronic HFpEF last echo 60-65% with cor pulmonale Here echo shows EF similar with slightly flattened interventricular septum and moderately elevated PASP Tells me he was diagnosed with a back in the past with heart failure but does not recall being on fluid medications-last office weight was 94 kg here he is 96 so I suspect he is a little bit volume overloaded still Looks like he was on 20 mg daily as needed of torsemide  based on PTA note review He lives with his son and grandson I am not sure who gives him his meds He is currently on Lasix  IV twice daily has significant JVD at the bedside so he will need further IV diuresis Labs are pending from this morning and we will adjust as needed Previous acute PEs in 2022 Appears to have completed treatment-no role for further anticoagulation currently Possible OSA at baseline Requires outpatient workup Chronic liver disease This has been reported in the past and needs to be followed up Impaired glucose tolerance with A1c of 5.6 and variable between 5 and 6 range Sugars predominantly are below 180 .  Sliding scale and checks have been discontinued Resolved hypokalemia Await labs replace as able    Data Reviewed:   No labs today   DVT prophylaxis: SCD TED hose  Status is: Inpatient Remains inpatient appropriate because:   Needs diuresis     Current Dispo: Inpatient  Subjective:   Coughing some today for the first time no chest pain no fever Feels overall okay Was on 6 L I cut him back to 2 L and he was talking full sentences did not drop her sats Tells me lives with son and grandson son got sick for 2 days stayed home and got better however he progressed to become more ill resulting in him being hospitalized He is aware that he has had heart failure in the past but is not sure about his meds-does not recall ever being put on a fluid  med Overall it seems like he has been sparsely following up with his specialist/PCP    Objective + exam Vitals:   03/31/24 0145 03/31/24 0404 03/31/24 0800 03/31/24 0819  BP:  137/88 (!) 161/84   Pulse:  (!) 106 96   Resp:  (!) 22 19   Temp:  99 F (37.2 C) 98.3 F (36.8 C)   TempSrc:  Oral Oral   SpO2: 97% 92% 97% 91%  Weight:  96 kg     Filed Weights   03/31/24 0404  Weight: 96 kg     Examination: Awake coherent pleasant looks about stated age JVD to jaw S1-S2 seems to be sinus No wheeze rales rhonchi no fremitus Abdomen is soft No lower extremity edema Power is 5/5 grossly     Scheduled Meds:  dexamethasone (DECADRON) injection  6 mg Intravenous Q24H   enoxaparin  (LOVENOX ) injection  40 mg Subcutaneous Q24H   furosemide   40 mg Intravenous BID   guaiFENesin   600 mg Oral BID   insulin  aspart  0-5 Units Subcutaneous QHS   insulin  aspart  0-6 Units Subcutaneous TID WC   ipratropium-albuterol   3 mL Nebulization Q6H   pantoprazole   40 mg Oral Q1200   sodium chloride  flush  3 mL Intravenous Q12H   sodium chloride  flush  3 mL Intravenous Q12H   Continuous Infusions:  Time 45 minutes  Jai-Gurmukh Jakhari Space, MD  [facility]

## 2024-03-31 NOTE — Plan of Care (Signed)
  Problem: Coping: Goal: Ability to adjust to condition or change in health will improve Outcome: Progressing   Problem: Health Behavior/Discharge Planning: Goal: Ability to identify and utilize available resources and services will improve Outcome: Progressing   Problem: Tissue Perfusion: Goal: Adequacy of tissue perfusion will improve Outcome: Progressing   Problem: Clinical Measurements: Goal: Will remain free from infection Outcome: Progressing   Problem: Activity: Goal: Risk for activity intolerance will decrease Outcome: Progressing   Problem: Education: Goal: Knowledge of disease or condition will improve Outcome: Progressing   Problem: Activity: Goal: Ability to tolerate increased activity will improve Outcome: Progressing   Problem: Respiratory: Goal: Levels of oxygenation will improve Outcome: Progressing Goal: Ability to maintain adequate ventilation will improve Outcome: Progressing

## 2024-03-31 NOTE — Plan of Care (Signed)
   Problem: Coping: Goal: Ability to adjust to condition or change in health will improve Outcome: Progressing   Problem: Fluid Volume: Goal: Ability to maintain a balanced intake and output will improve Outcome: Progressing

## 2024-04-01 ENCOUNTER — Inpatient Hospital Stay (HOSPITAL_COMMUNITY)

## 2024-04-01 DIAGNOSIS — J9601 Acute respiratory failure with hypoxia: Secondary | ICD-10-CM | POA: Diagnosis not present

## 2024-04-01 LAB — CBC WITH DIFFERENTIAL/PLATELET
Abs Immature Granulocytes: 0.05 K/uL (ref 0.00–0.07)
Basophils Absolute: 0 K/uL (ref 0.0–0.1)
Basophils Relative: 0 %
Eosinophils Absolute: 0 K/uL (ref 0.0–0.5)
Eosinophils Relative: 0 %
HCT: 45.7 % (ref 39.0–52.0)
Hemoglobin: 13.8 g/dL (ref 13.0–17.0)
Immature Granulocytes: 1 %
Lymphocytes Relative: 12 %
Lymphs Abs: 0.4 K/uL — ABNORMAL LOW (ref 0.7–4.0)
MCH: 30.1 pg (ref 26.0–34.0)
MCHC: 30.2 g/dL (ref 30.0–36.0)
MCV: 99.6 fL (ref 80.0–100.0)
Monocytes Absolute: 0.5 K/uL (ref 0.1–1.0)
Monocytes Relative: 14 %
Neutro Abs: 2.5 K/uL (ref 1.7–7.7)
Neutrophils Relative %: 73 %
Platelets: 140 K/uL — ABNORMAL LOW (ref 150–400)
RBC: 4.59 MIL/uL (ref 4.22–5.81)
RDW: 15 % (ref 11.5–15.5)
WBC: 3.5 K/uL — ABNORMAL LOW (ref 4.0–10.5)
nRBC: 0 % (ref 0.0–0.2)

## 2024-04-01 LAB — BASIC METABOLIC PANEL WITH GFR
Anion gap: 12 (ref 5–15)
BUN: 18 mg/dL (ref 8–23)
CO2: 40 mmol/L — ABNORMAL HIGH (ref 22–32)
Calcium: 8.8 mg/dL — ABNORMAL LOW (ref 8.9–10.3)
Chloride: 83 mmol/L — ABNORMAL LOW (ref 98–111)
Creatinine, Ser: 1.05 mg/dL (ref 0.61–1.24)
GFR, Estimated: >60 mL/min (ref >60)
Glucose, Bld: 118 mg/dL — ABNORMAL HIGH (ref 70–99)
Potassium: 3.2 mmol/L — ABNORMAL LOW (ref 3.5–5.1)
Sodium: 135 mmol/L (ref 135–145)

## 2024-04-01 LAB — MAGNESIUM: Magnesium: 1.7 mg/dL (ref 1.7–2.4)

## 2024-04-01 LAB — PHOSPHORUS: Phosphorus: 3.4 mg/dL (ref 2.5–4.6)

## 2024-04-01 MED ORDER — MAGNESIUM SULFATE 2 GM/50ML IV SOLN
2.0000 g | Freq: Once | INTRAVENOUS | Status: AC
  Administered 2024-04-01: 2 g via INTRAVENOUS
  Filled 2024-04-01: qty 50

## 2024-04-01 MED ORDER — POTASSIUM CHLORIDE CRYS ER 20 MEQ PO TBCR
40.0000 meq | EXTENDED_RELEASE_TABLET | Freq: Two times a day (BID) | ORAL | Status: DC
  Administered 2024-04-01 – 2024-04-03 (×5): 40 meq via ORAL
  Filled 2024-04-01 (×5): qty 2

## 2024-04-01 MED ORDER — FLUTICASONE PROPIONATE 50 MCG/ACT NA SUSP
2.0000 | Freq: Every day | NASAL | Status: DC
  Administered 2024-04-01 – 2024-04-10 (×10): 2 via NASAL
  Filled 2024-04-01: qty 16

## 2024-04-01 MED ORDER — FUROSEMIDE 40 MG PO TABS
40.0000 mg | ORAL_TABLET | Freq: Every day | ORAL | Status: DC
  Administered 2024-04-01 – 2024-04-02 (×2): 40 mg via ORAL
  Filled 2024-04-01 (×2): qty 1

## 2024-04-01 MED ORDER — ACETAZOLAMIDE 250 MG PO TABS
500.0000 mg | ORAL_TABLET | Freq: Two times a day (BID) | ORAL | Status: AC
  Administered 2024-04-01 (×2): 500 mg via ORAL
  Filled 2024-04-01 (×2): qty 2

## 2024-04-01 MED ORDER — FLUTICASONE FUROATE-VILANTEROL 200-25 MCG/ACT IN AEPB
1.0000 | INHALATION_SPRAY | Freq: Every day | RESPIRATORY_TRACT | Status: DC
  Administered 2024-04-03 – 2024-04-04 (×2): 1 via RESPIRATORY_TRACT
  Filled 2024-04-01 (×2): qty 28

## 2024-04-01 NOTE — Plan of Care (Signed)
  Problem: Coping: Goal: Ability to adjust to condition or change in health will improve Outcome: Progressing   Problem: Health Behavior/Discharge Planning: Goal: Ability to identify and utilize available resources and services will improve Outcome: Progressing   

## 2024-04-01 NOTE — Progress Notes (Signed)
 TR[facility]   ROUNDING   NOTE Mitchell King FMW:[id number]  DOB: Jun 11, 1951  DOA: 03/29/2024  PCP: No primary care provider on file.  04/01/2024,8:44 AM  LOS: 3 days    Code Status: Full code     from: Home   73 year old male known to have chronic respiratory failure secondary to COPD-prior tobacco-currently follows with Dr. 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 previously admitted with hyperammonemia 07/2020  Came in from home 10/9 with 2 weeks of worsening chills rigors diminished breath sounds given Solu-Medrol  in ED requiring more than 2 L of his baseline to maintain sats in the 90s-requiring 5 L and was placed on BiPAP ultimately weaned off of it in the ED Sodium 136 potassium 3.2 BUN/creatinine 7/1.1 GFR 60 BNP 1440 Lactic acid 2.2 cycling to 1.8 WBC 2.2 hemoglobin 13.7 platelet 109 differential normal Legionella pending expectorated sputum pending echo pending  COVID-positive but CRP 0.9-given a dose of remdesivir Decadron ceftriaxone and maintained only on 10/12 x-ray repeated   Assessment  & Plan :    Covid Likely slightly contributory to overall scenario-could have pushed the patient into multifactorial respiratory failure but given low CRP --only  Decadron 4 mg daily EOT 10/18 Legionella negative sputum never collected Get x-ray this morning given desaturation off oxygen  COPD/Emphysema-no overt COVID symptomatology or fever holding antibiotics Followed by Dr. Kara in the outpatient setting and chronically on 2 L--desat screen pending Continue steroids add Flonase 2 spray daily scheduled, Mucinex  600 twice daily continues, DuoNeb-add Breo Ellipta NSVT OVERNIGHT 160--- 2/2 electrolyte abnormalities potentially-sodium fine but potassium 3.2-giving magnesium  2 g to correct keep on monitors adding K. Dur  and ensure electrolytes remain stable Acute superimposed on chronic HFpEF last echo 60-65% with cor pulmonale Here echo shows EF similar with slightly flattened interventricular septum and moderately elevated PASP Looks like he was on 20 mg daily as needed of torsemide  based on PTA note review Lasix  downward adjusted to 40 mg p.o.-see below Watch bicarb-add Diamox  500 twice daily 2 doses then stop I/O- 5.8 since admission-weights inaccurate Previous acute PEs in 2022 Appears to have completed treatment-no role for further anticoagulation currently Possible OSA at baseline Requires outpatient workup Chronic liver disease This has been reported in the past and needs to be followed up with periodic LFTs when he is better Impaired glucose tolerance with A1c of 5.6 and variable between 5 and 6 range Sugars predominantly are below 180 Sliding scale and checks have been discontinued Moderate hypokalemia- Likely contributed to SVT overnight See replacement as above    Data Reviewed:   Sodium 135 potassium 3.2 bicarb 40 BUN/creatinine 18/1.0 WBC 3.5 platelet 140 no shift  DVT prophylaxis: SCD TED hose  Status is: Inpatient Remains inpatient appropriate because:   Needs diuresis     Current Dispo: Inpatient     Subjective:   Desats off of oxygen  goes down to the 70s-quite tremulous Increasing sputum He has predominant wheeze on the right side Overall he feels about the same may be just slightly improved   Objective + exam Vitals:   04/01/24 0152 04/01/24 0523 04/01/24 0733 04/01/24 0834  BP:  (!) 134/92    Pulse: (!) 122     Resp: 14     Temp:  98.1 F (36.7 C)  TempSrc:  Oral    SpO2:    96%  Weight:   96 kg    Filed Weights   03/31/24 0404 04/01/24 0733  Weight: 96 kg 96 kg     Examination:  EOMI NCAT looks about stated age JVD still present with positive hepatojugular reflex S1-S2 tachycardic seems to be sinus Abdomen is soft He has increased  rhonchorous sounds on the right side of the posterior lateral lung fields Left side seems clear Power 5/5    Scheduled Meds:  dexamethasone  4 mg Oral Daily   enoxaparin  (LOVENOX ) injection  40 mg Subcutaneous Q24H   fluticasone   2 spray Each Nare Daily   fluticasone  furoate-vilanterol  1 puff Inhalation Daily   furosemide   40 mg Oral Daily   guaiFENesin   600 mg Oral BID   ipratropium-albuterol   3 mL Nebulization Q6H   pantoprazole   40 mg Oral Q1200   potassium chloride   40 mEq Oral BID   sodium chloride  flush  3 mL Intravenous Q12H   sodium chloride  flush  3 mL Intravenous Q12H  , On admit, Continuous Infusions:  magnesium  sulfate bolus IVPB 2 g (04/01/24 0806)    Time 25 minutes  Jai-Gurmukh Justyn Langham, MD  [facility]

## 2024-04-01 NOTE — Plan of Care (Signed)
  Problem: Fluid Volume: Goal: Ability to maintain a balanced intake and output will improve Outcome: Progressing   Problem: Health Behavior/Discharge Planning: Goal: Ability to manage health-related needs will improve Outcome: Progressing   Problem: Nutritional: Goal: Maintenance of adequate nutrition will improve Outcome: Progressing   Problem: Skin Integrity: Goal: Risk for impaired skin integrity will decrease Outcome: Progressing   Problem: Education: Goal: Knowledge of General Education information will improve Description: Including pain rating scale, medication(s)/side effects and non-pharmacologic comfort measures Outcome: Progressing   Problem: Clinical Measurements: Goal: Will remain free from infection Outcome: Progressing Goal: Respiratory complications will improve Outcome: Progressing Goal: Cardiovascular complication will be avoided Outcome: Progressing   Problem: Activity: Goal: Risk for activity intolerance will decrease Outcome: Progressing

## 2024-04-02 DIAGNOSIS — J9601 Acute respiratory failure with hypoxia: Secondary | ICD-10-CM | POA: Diagnosis not present

## 2024-04-02 LAB — CBC WITH DIFFERENTIAL/PLATELET
Abs Immature Granulocytes: 0.05 K/uL (ref 0.00–0.07)
Basophils Absolute: 0 K/uL (ref 0.0–0.1)
Basophils Relative: 0 %
Eosinophils Absolute: 0 K/uL (ref 0.0–0.5)
Eosinophils Relative: 0 %
HCT: 46.1 % (ref 39.0–52.0)
Hemoglobin: 14.2 g/dL (ref 13.0–17.0)
Immature Granulocytes: 1 %
Lymphocytes Relative: 10 %
Lymphs Abs: 0.4 K/uL — ABNORMAL LOW (ref 0.7–4.0)
MCH: 30.7 pg (ref 26.0–34.0)
MCHC: 30.8 g/dL (ref 30.0–36.0)
MCV: 99.6 fL (ref 80.0–100.0)
Monocytes Absolute: 0.4 K/uL (ref 0.1–1.0)
Monocytes Relative: 11 %
Neutro Abs: 2.8 K/uL (ref 1.7–7.7)
Neutrophils Relative %: 78 %
Platelets: 131 K/uL — ABNORMAL LOW (ref 150–400)
RBC: 4.63 MIL/uL (ref 4.22–5.81)
RDW: 14.6 % (ref 11.5–15.5)
WBC: 3.6 K/uL — ABNORMAL LOW (ref 4.0–10.5)
nRBC: 0 % (ref 0.0–0.2)

## 2024-04-02 LAB — BASIC METABOLIC PANEL WITH GFR
Anion gap: 11 (ref 5–15)
BUN: 19 mg/dL (ref 8–23)
CO2: 35 mmol/L — ABNORMAL HIGH (ref 22–32)
Calcium: 9.1 mg/dL (ref 8.9–10.3)
Chloride: 88 mmol/L — ABNORMAL LOW (ref 98–111)
Creatinine, Ser: 1.33 mg/dL — ABNORMAL HIGH (ref 0.61–1.24)
GFR, Estimated: 56 mL/min — ABNORMAL LOW (ref >60)
Glucose, Bld: 124 mg/dL — ABNORMAL HIGH (ref 70–99)
Potassium: 3.9 mmol/L (ref 3.5–5.1)
Sodium: 134 mmol/L — ABNORMAL LOW (ref 135–145)

## 2024-04-02 MED ORDER — METOPROLOL SUCCINATE ER 25 MG PO TB24
25.0000 mg | ORAL_TABLET | Freq: Every day | ORAL | Status: DC
  Administered 2024-04-02 – 2024-04-07 (×6): 25 mg via ORAL
  Filled 2024-04-02 (×6): qty 1

## 2024-04-02 MED ORDER — ALBUTEROL SULFATE (2.5 MG/3ML) 0.083% IN NEBU
2.5000 mg | INHALATION_SOLUTION | RESPIRATORY_TRACT | Status: AC
  Administered 2024-04-02: 2.5 mg via RESPIRATORY_TRACT
  Filled 2024-04-02: qty 3

## 2024-04-02 MED ORDER — FUROSEMIDE 20 MG PO TABS: 20.0000 mg | ORAL_TABLET | Freq: Every day | ORAL | Status: DC

## 2024-04-02 NOTE — Progress Notes (Signed)
 [facility]   ROUNDING   NOTE Mitchell King FMW:[id number]  DOB: Jul 02, 1950  DOA: 03/29/2024  PCP: No primary care provider on file.  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 previously admitted with hyperammonemia 07/2020  Came in from home 10/9 with 2 weeks of worsening chills rigors diminished breath sounds given Solu-Medrol  in ED requiring more than 2 L of his baseline to maintain sats in the 90s-requiring 5 L and was placed on BiPAP ultimately weaned off of it in the ED Sodium 136 potassium 3.2 BUN/creatinine 7/1.1 GFR 60 BNP 1440 Lactic acid 2.2 cycling to 1.8 WBC 2.2 hemoglobin 13.7 platelet 109 differential normal Legionella pending expectorated sputum pending echo pending  COVID-positive but CRP 0.9-given a dose of remdesivir Decadron ceftriaxone and maintained only on 10/12 x-ray repeated shows emphysema and no infiltrates   Assessment  & Plan :    Covid Likely slightly contributory to overall scenario-as low CRP --only  Decadron 4 mg daily EOT 10/18 Legionella negative sputum never collected COPD/Emphysema-no overt COVID symptomatology or fever holding antibiotics Followed by Mitchell King in the outpatient setting and chronically on 2 L--Will need oxygen  at home Continues Flonase 2 spray daily scheduled, Mucinex  600 twice daily continues, DuoNeb-add Breo Ellipta Steroids as above for now and is still a bit wheezy still-Will ask RT to come by and see and maybe give a couple of scheduled nebs NSVT  Nonsustained continues to have some PVCs PTA?  Not on meds-because he is a little bit more euvolemic I am adding  low-dose Toprol -XL Acute superimposed on chronic HFpEF last echo 60-65% with cor pulmonale Here echo shows EF similar with slightly flattened interventricular septum and moderately elevated PASP Looks like he was on 20 mg daily as needed of torsemide  based on PTA note review-I am not sure if he was on any other meds -9 9 L and still has JVD Holding further Lasix  for now-Will need to get chest x-ray and reassess in a.m. Place TED hose Previous acute PEs in 2022 Appears to have completed treatment-no role for further anticoagulation currently Possible OSA at baseline Requires outpatient workup Chronic liver disease This has been reported in the past and needs to be followed up with periodic LFTs when he is better Impaired glucose tolerance with A1c of 5.6 and variable between 5 and 6 range Sugars predominantly are below 180 CBGs are ranging 120s to 130s Moderate hypokalemia- Hypokalemia is resolved, check magnesium  with next lab draw    Data Reviewed:   Sodium 134 potassium 3.9 BUN/creatinine 19/1.3 WBC 3.6 platelet 131 hemoglobin 14  DVT prophylaxis: SCD TED hose  Status is: Inpatient Remains inpatient appropriate because:   Needs diuresis     Current Dispo: Inpatient     Subjective:   Somewhat shaky no distress Still a little bit short of breath No chest pain   Objective + exam Vitals:   04/02/24 0602 04/02/24 0759 04/02/24 0807 04/02/24 1134  BP:   (!) 150/95 (!) 151/93  Pulse:   85 95  Resp:   (!) 26 17  Temp:  98.6 F (37 C) 97.8 F (36.6 C)  TempSrc:   Oral Oral  SpO2: 94% 96% 100% 100%  Weight:       Filed Weights   03/31/24 0404 04/01/24 0733  Weight: 96 kg 96 kg     Examination:  EOMI NCAT somewhat disheveled JVD present Wheeze posterolaterally on both sides No lower extremity edema Some abdominal swelling S1-S2 no murmur does have some PVCs on monitors    Scheduled Meds:  dexamethasone  4 mg Oral Daily   enoxaparin  (LOVENOX )  injection  40 mg Subcutaneous Q24H   fluticasone   2 spray Each Nare Daily   fluticasone  furoate-vilanterol  1 puff Inhalation Daily   guaiFENesin   600 mg Oral BID   pantoprazole   40 mg Oral Q1200   potassium chloride   40 mEq Oral BID   sodium chloride  flush  3 mL Intravenous Q12H   sodium chloride  flush  3 mL Intravenous Q12H  , On admit, Continuous Infusions:  Time 25 minutes  [name], MD  [facility]

## 2024-04-02 NOTE — Plan of Care (Signed)
   Problem: Coping: Goal: Ability to adjust to condition or change in health will improve Outcome: Progressing   Problem: Fluid Volume: Goal: Ability to maintain a balanced intake and output will improve Outcome: Progressing

## 2024-04-02 NOTE — Plan of Care (Signed)

## 2024-04-03 DIAGNOSIS — J9601 Acute respiratory failure with hypoxia: Secondary | ICD-10-CM | POA: Diagnosis not present

## 2024-04-03 LAB — BASIC METABOLIC PANEL WITH GFR
Anion gap: 10 (ref 5–15)
BUN: 36 mg/dL — ABNORMAL HIGH (ref 8–23)
CO2: 32 mmol/L (ref 22–32)
Calcium: 9.1 mg/dL (ref 8.9–10.3)
Chloride: 92 mmol/L — ABNORMAL LOW (ref 98–111)
Creatinine, Ser: 1.49 mg/dL — ABNORMAL HIGH (ref 0.61–1.24)
GFR, Estimated: 49 mL/min — ABNORMAL LOW (ref >60)
Glucose, Bld: 133 mg/dL — ABNORMAL HIGH (ref 70–99)
Potassium: 5 mmol/L (ref 3.5–5.1)
Sodium: 134 mmol/L — ABNORMAL LOW (ref 135–145)

## 2024-04-03 LAB — CBC WITH DIFFERENTIAL/PLATELET
Abs Immature Granulocytes: 0.06 K/uL (ref 0.00–0.07)
Basophils Absolute: 0 K/uL (ref 0.0–0.1)
Basophils Relative: 0 %
Eosinophils Absolute: 0 K/uL (ref 0.0–0.5)
Eosinophils Relative: 0 %
HCT: 48.5 % (ref 39.0–52.0)
Hemoglobin: 14.6 g/dL (ref 13.0–17.0)
Immature Granulocytes: 2 %
Lymphocytes Relative: 11 %
Lymphs Abs: 0.4 K/uL — ABNORMAL LOW (ref 0.7–4.0)
MCH: 30.1 pg (ref 26.0–34.0)
MCHC: 30.1 g/dL (ref 30.0–36.0)
MCV: 100 fL (ref 80.0–100.0)
Monocytes Absolute: 0.6 K/uL (ref 0.1–1.0)
Monocytes Relative: 14 %
Neutro Abs: 2.9 K/uL (ref 1.7–7.7)
Neutrophils Relative %: 73 %
Platelets: 145 K/uL — ABNORMAL LOW (ref 150–400)
RBC: 4.85 MIL/uL (ref 4.22–5.81)
RDW: 14.3 % (ref 11.5–15.5)
WBC: 4 K/uL (ref 4.0–10.5)
nRBC: 0 % (ref 0.0–0.2)

## 2024-04-03 LAB — CULTURE, BLOOD (ROUTINE X 2)
Culture: NO GROWTH
Culture: NO GROWTH
Special Requests: ADEQUATE
Special Requests: ADEQUATE

## 2024-04-03 MED ORDER — FUROSEMIDE 10 MG/ML IJ SOLN
20.0000 mg | Freq: Once | INTRAMUSCULAR | Status: AC
  Administered 2024-04-03: 20 mg via INTRAVENOUS
  Filled 2024-04-03: qty 2

## 2024-04-03 NOTE — Evaluation (Signed)
 RT Evaluate and Treat Note  04/03/2024   Breathing is (select one): Same as normal    The following was found on auscultation (select multiple):  Bilateral Breath Sounds: Diminished;Clear (04/03/24 0802)  R Upper  Breath Sounds: Diminished (04/03/24 0800) L Upper Breath Sounds: Diminished (04/03/24 0800) R Lower Breath Sounds: Diminished (04/03/24 0800) L Lower Breath Sounds: Diminished (04/03/24 0800)    Cough Assessment: Cough: None (04/03/24 0800)    Most Recent Chest Xray:... (No results found.    The following medications and/or interventions were ordered/changed/discontinued as part of the Respiratory Treatment protocol:   Medication Changes: No medication changes at this time.    Airway Clearance Changes:  No changes at this time.    Oxygen  Therapy Changes: Nasal cannula weaned to 3L at this time. Pt wears 2L baseline.

## 2024-04-03 NOTE — TOC Initial Note (Signed)
 Transition of Care [facility]) - Initial/Assessment Note    Patient Details  Name: Mitchell King MRN: [id number] Date of Birth: July 16, 1950  [facility] CM/SW Contact:    Marval Gell, RN Phone Number: 04/03/2024, 2:18 PM  Clinical Narrative:                   Expected Discharge Plan: Home/Self Care Barriers to Discharge: Continued Medical Work up   Patient Goals and CMS Choice    Per chart review. Patient admitted w COPD, positive COVID, 2L chronic . Patient from home lives w son. Attempt to reach both by phone, unsuccessful.  Will continue to follow         Expected Discharge Plan and Services   Discharge Planning Services: CM Consult   Living arrangements for the past 2 months: Single Family Home                                      Prior Living Arrangements/Services Living arrangements for the past 2 months: Single Family Home Lives with:: Adult Children                   Activities of Daily Living      Permission Sought/Granted                  Emotional Assessment              Admission diagnosis:  Acute respiratory failure with hypoxia (HCC) [J96.01] Acute hypoxic respiratory failure (HCC) [J96.01] COVID-19 [U07.1] Patient Active Problem List   Diagnosis Date Noted   COVID-19 virus infection 03/29/2024   CHF exacerbation (HCC) 04/15/2021   Acute congestive heart failure (HCC) 04/13/2021   Anemia 04/13/2021   Malnutrition of moderate degree 08/14/2020   Hyperammonemia    Acute hypoxic respiratory failure (HCC)    Acute cor pulmonale (HCC) 08/06/2020   History of pulmonary embolus (PE) 08/06/2020   Right heart failure (HCC) 08/06/2020   Acute exacerbation of CHF (congestive heart failure) (HCC) 08/04/2020   Essential hypertension 08/04/2020   Non-insulin  dependent type 2 diabetes mellitus (HCC) 08/04/2020   Nondependent alcohol abuse, continuous drinking behavior 08/04/2020   Acute hepatitis C 10/27/2001   COPD  with acute exacerbation (HCC) 08/21/2001   PCP:  No primary care provider on file. Pharmacy:   [facility] - [place], [place] - [address] [place] [address] Phone: [phone] Fax: [phone]  [id number] - [place], [place] [address] AT [facility] [address] [facility] [address] [place] [address] Phone: [phone] Fax: [phone]  [facility] [address] [place] [address] Phone: [phone] Fax: [phone]  [id number] [place],  - [address] AT [facility] [address] [place] [address] Phone: [phone] Fax: [phone]  [id number] - [address] ([place]), [place] - [address] [place] [address] Phone: [phone] Fax: [phone]  [facility] - [place], [place] - [address] [place] [address] Phone: [phone] Fax: [phone]     Social Drivers of Health (SDOH) Social History: SDOH Screenings   Food Insecurity: No Food Insecurity (03/30/2024)  Housing: Low Risk  (03/30/2024)  Transportation Needs: No Transportation Needs (03/30/2024)  Utilities: Not At Risk (03/30/2024)  Social Connections: Moderately Integrated (03/30/2024)  Tobacco Use: Medium Risk (03/29/2024)   SDOH Interventions:     Readmission Risk Interventions     No data to display

## 2024-04-03 NOTE — Progress Notes (Signed)
 [facility]   ROUNDING   NOTE Mitchell King FMW:[id number]  DOB: 1950/08/24  DOA: 03/29/2024  PCP: No primary care provider on file.  04/03/2024,10:40 AM  LOS: 5 days    Code Status: Full code     from: Home   73 year old male known to have chronic respiratory failure secondary to COPD-prior tobacco-currently follows with Dr. 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 previously admitted with hyperammonemia 07/2020  Came in from home 10/9 with 2 weeks of worsening chills rigors diminished breath sounds given Solu-Medrol  in ED requiring more than 2 L of his baseline to maintain sats in the 90s-requiring 5 L and was placed on BiPAP ultimately weaned off of it in the ED Sodium 136 potassium 3.2 BUN/creatinine 7/1.1 GFR 60 BNP 1440 Lactic acid 2.2 cycling to 1.8 WBC 2.2 hemoglobin 13.7 platelet 109 differential normal Legionella pending expectorated sputum pending echo pending  COVID-positive but CRP 0.9-given a dose of remdesivir Decadron ceftriaxone and maintained only on 10/12 x-ray repeated shows emphysema and no infiltrates   Assessment  & Plan :    Covid Likely slightly contributory to overall scenario-as low CRP--only  Decadron 4 mg daily EOT 10/18 Legionella negative sputum never collected COPD/Emphysema-no overt COVID symptomatology or fever holding antibiotics Followed by Dr. Kara in the outpatient setting and chronically on 2 L--Will need oxygen  at home Continues Flonase 2 spray daily scheduled, Mucinex  600 twice daily continues, DuoNeb-add Breo Ellipta Steroids as above for now  NSVT  Nonsustained continues to have some PVCs PTA?  Not on meds-because he is a little bit more euvolemic I am adding low-dose Toprol -XL 25 daily Acute superimposed on chronic HFpEF last echo 60-65% with cor pulmonale Here echo  shows EF similar with slightly flattened interventricular septum and moderately elevated PASP Right-sided elevated PASP Looks like he was on 20 mg daily as needed of torsemide  based on PTA note review-I am not sure if he was on any other meds -9 9 L and still has JVD Have to be cautious with diuresis placed on Lasix  20 daily an x-ray in the morning-high risk of cardiorenal syndrome Previous acute PEs in 2022 Appears to have completed treatment-no role for further anticoagulation currently Possible OSA at baseline Requires outpatient workup Chronic liver disease This has been reported in the past and needs to be followed up with periodic LFTs when he is better Impaired glucose tolerance with A1c of 5.6 and variable between 5 and 6 range Sugars predominantly are below 180 Needs CBG coverage at this time no sliding scale is required-as an outpatient may benefit from Farxiga  if can afford Moderate hypokalemia-resolved Hypokalemia is resolved replacement and I have stopped the replacement Check magnesium  once we start diuresing again  Discussed with patient's son Pharoah [phone] on telephone today  Data Reviewed:   Sodium 134 potassium 5.0 BUN/creatinine 36/1.49 Bicarb is now 32 WBC 4.0 hemoglobin 14 platelet 145  DVT prophylaxis: SCD TED hose  Status is: Inpatient Remains inpatient appropriate because:   Needs diuresis     Current Dispo: Inpatient     Subjective:   Overall looks better no chest pain Less sputum today   Objective + exam Vitals:   04/03/24 0312 04/03/24 0413 04/03/24 0800 04/03/24 0946  BP: 111/80  (!) 153/103 (!) 145/91  Pulse:  80  66 75  Resp: 18  19   Temp: 97.7 F (36.5 C)  97.9 F (36.6 C)   TempSrc: Axillary  Oral   SpO2: 92%  97%   Weight:  88.3 kg     Filed Weights   03/31/24 0404 04/01/24 0733 04/03/24 0413  Weight: 96 kg 96 kg 88.3 kg     Examination:  EOMI NCAT somewh but is a little diminished at disheveled JVD present Wheeze  posterolaterally on both sides No lower extremity edema Some abdominal swelling with a little bit of maybe shifting dullness S1-S2 no murmur does have some PVCs on monitors had less than before    Scheduled Meds:  dexamethasone  4 mg Oral Daily   enoxaparin  (LOVENOX ) injection  40 mg Subcutaneous Q24H   fluticasone   2 spray Each Nare Daily   fluticasone  furoate-vilanterol  1 puff Inhalation Daily   guaiFENesin   600 mg Oral BID   metoprolol  succinate  25 mg Oral Daily   pantoprazole   40 mg Oral Q1200   potassium chloride   40 mEq Oral BID   sodium chloride  flush  3 mL Intravenous Q12H   sodium chloride  flush  3 mL Intravenous Q12H  , Continuous Infusions:  Time 25 minutes  Jai-Gurmukh Keishana Klinger, MD  [facility]

## 2024-04-03 NOTE — Plan of Care (Signed)
  Problem: Coping: Goal: Ability to adjust to condition or change in health will improve Outcome: Progressing   Problem: Fluid Volume: Goal: Ability to maintain a balanced intake and output will improve Outcome: Progressing   Problem: Health Behavior/Discharge Planning: Goal: Ability to manage health-related needs will improve Outcome: Progressing   Problem: Education: Goal: Knowledge of General Education information will improve Description: Including pain rating scale, medication(s)/side effects and non-pharmacologic comfort measures Outcome: Progressing   Problem: Clinical Measurements: Goal: Will remain free from infection Outcome: Progressing Goal: Diagnostic test results will improve Outcome: Progressing   Problem: Activity: Goal: Risk for activity intolerance will decrease Outcome: Progressing   Problem: Activity: Goal: Ability to tolerate increased activity will improve Outcome: Progressing

## 2024-04-04 ENCOUNTER — Inpatient Hospital Stay (HOSPITAL_COMMUNITY)

## 2024-04-04 DIAGNOSIS — J9601 Acute respiratory failure with hypoxia: Secondary | ICD-10-CM | POA: Diagnosis not present

## 2024-04-04 LAB — CBC WITH DIFFERENTIAL/PLATELET
Abs Immature Granulocytes: 0.04 K/uL (ref 0.00–0.07)
Basophils Absolute: 0 K/uL (ref 0.0–0.1)
Basophils Relative: 0 %
Eosinophils Absolute: 0 K/uL (ref 0.0–0.5)
Eosinophils Relative: 0 %
HCT: 50.3 % (ref 39.0–52.0)
Hemoglobin: 15.3 g/dL (ref 13.0–17.0)
Immature Granulocytes: 1 %
Lymphocytes Relative: 10 %
Lymphs Abs: 0.4 K/uL — ABNORMAL LOW (ref 0.7–4.0)
MCH: 30.1 pg (ref 26.0–34.0)
MCHC: 30.4 g/dL (ref 30.0–36.0)
MCV: 98.8 fL (ref 80.0–100.0)
Monocytes Absolute: 0.4 K/uL (ref 0.1–1.0)
Monocytes Relative: 12 %
Neutro Abs: 2.7 K/uL (ref 1.7–7.7)
Neutrophils Relative %: 77 %
Platelets: 148 K/uL — ABNORMAL LOW (ref 150–400)
RBC: 5.09 MIL/uL (ref 4.22–5.81)
RDW: 13.9 % (ref 11.5–15.5)
WBC: 3.5 K/uL — ABNORMAL LOW (ref 4.0–10.5)
nRBC: 0 % (ref 0.0–0.2)

## 2024-04-04 LAB — BASIC METABOLIC PANEL WITH GFR
Anion gap: 9 (ref 5–15)
BUN: 41 mg/dL — ABNORMAL HIGH (ref 8–23)
CO2: 31 mmol/L (ref 22–32)
Calcium: 9.6 mg/dL (ref 8.9–10.3)
Chloride: 93 mmol/L — ABNORMAL LOW (ref 98–111)
Creatinine, Ser: 1.12 mg/dL (ref 0.61–1.24)
GFR, Estimated: >60 mL/min (ref >60)
Glucose, Bld: 124 mg/dL — ABNORMAL HIGH (ref 70–99)
Potassium: 4.7 mmol/L (ref 3.5–5.1)
Sodium: 133 mmol/L — ABNORMAL LOW (ref 135–145)

## 2024-04-04 MED ORDER — FUROSEMIDE 10 MG/ML IJ SOLN
20.0000 mg | Freq: Once | INTRAMUSCULAR | Status: AC
  Administered 2024-04-04: 20 mg via INTRAVENOUS
  Filled 2024-04-04: qty 2

## 2024-04-04 MED ORDER — BUDESONIDE 0.25 MG/2ML IN SUSP
0.2500 mg | Freq: Two times a day (BID) | RESPIRATORY_TRACT | Status: DC
  Administered 2024-04-04 – 2024-04-10 (×11): 0.25 mg via RESPIRATORY_TRACT
  Filled 2024-04-04 (×12): qty 2

## 2024-04-04 MED ORDER — ARFORMOTEROL TARTRATE 15 MCG/2ML IN NEBU
15.0000 ug | INHALATION_SOLUTION | Freq: Two times a day (BID) | RESPIRATORY_TRACT | Status: DC
  Administered 2024-04-04 – 2024-04-05 (×3): 15 ug via RESPIRATORY_TRACT
  Filled 2024-04-04 (×4): qty 2

## 2024-04-04 NOTE — Plan of Care (Signed)
  Problem: Education: Goal: Ability to describe self-care measures that may prevent or decrease complications (Diabetes Survival Skills Education) will improve Outcome: Progressing   Problem: Education: Goal: Individualized Educational Video(s) Outcome: Progressing   

## 2024-04-04 NOTE — Evaluation (Signed)
 RT Evaluate and Treat Note  04/04/2024   Breathing is (select one): Same as normal    The following was found on auscultation (select multiple):  Bilateral Breath Sounds: Clear;Diminished (04/04/24 0750)  R Upper  Breath Sounds: Diminished (04/03/24 0800) L Upper Breath Sounds: Diminished (04/03/24 0800) R Lower Breath Sounds: Diminished (04/03/24 0800) L Lower Breath Sounds: Diminished (04/03/24 0800)    Cough Assessment: Cough: None (04/03/24 0800)    Most Recent Chest Xray:... (DG Chest 2 View Result Date: 04/04/2024 EXAM: 2 VIEW(S) XRAY OF THE CHEST 04/04/2024 05:47:14 AM COMPARISON: 04/01/2024 CLINICAL HISTORY: Pleural effusion [id number]. Encounter for pleural effusion. FINDINGS: LUNGS AND PLEURA: No focal pulmonary opacity. Mild pulmonary edema. No pleural effusion. No pneumothorax. HEART AND MEDIASTINUM: Stable cardiomegaly. Aortic atherosclerosis. BONES AND SOFT TISSUES: No acute osseous abnormality. IMPRESSION: 1. Mild pulmonary edema. 2. Cardiomegaly. Electronically signed by: Donnice Mania MD 04/04/2024 09:54 AM EDT RP Workstation: [id number]      The following medications and/or interventions were ordered/changed/discontinued as part of the Respiratory Treatment protocol:   Medication Changes: Budesonide  Changed and Brovana  Changed (Pt unable to take Breo properly)   Airway Clearance Changes: none   Oxygen  Therapy Changes: Pt weaned to 4l [place] from 5 this AM.

## 2024-04-04 NOTE — Progress Notes (Signed)
 PROGRESS NOTE    Mitchell King  FMW:[id number] DOB: 07/16/1950 DOA: 03/29/2024 PCP: No primary care provider on file.   Chief Complaint  Patient presents with   Shortness of Breath    Brief Narrative:   73 year old male known to have chronic respiratory failure secondary to COPD-prior tobacco-currently follows with Dr. 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 previously admitted with hyperammonemia 07/2020   Came in from home 10/9 with 2 weeks of worsening chills rigors diminished breath sounds given Solu-Medrol  in ED requiring more than 2 L of his baseline to maintain sats in the 90s-requiring 5 L and was placed on BiPAP ultimately weaned off of it in the ED Sodium 136 potassium 3.2 BUN/creatinine 7/1.1 GFR 60 BNP 1440 Lactic acid 2.2 cycling to 1.8 WBC 2.2 hemoglobin 13.7 platelet 109 differential normal Legionella pending expectorated sputum pending echo pending   COVID-positive but CRP 0.9-given a dose of remdesivir Decadron ceftriaxone and maintained only on 10/12 x-ray repeated shows emphysema and no infiltrates   Assessment & Plan:   Principal Problem:   Acute hypoxic respiratory failure (HCC) Active Problems:   COPD with acute exacerbation (HCC)   COVID-19 virus infection   Acute exacerbation of CHF (congestive heart failure) (HCC)   Essential hypertension   Non-insulin  dependent type 2 diabetes mellitus (HCC)  Acute on chronic respiratory failure with hypoxia COVID-19 infection  COPD -He is on 2 L oxygen  at home, this morning on 5 L, discussed with staff will try to wean off to home dose if possible, again encouraged use incentive and flutter valve today. -Given worsening hypoxia, continue with Decadron . - He was encouraged use incentive spirometry and flutter valve . -  Legionella negative sputum never collected - Followed by Dr. Kara in the outpatient setting and chronically on 2 -Continues Flonase 2 spray daily scheduled, Mucinex  600 twice daily,   DuoNeb-add Breo Ellipta - Steroids as above for now   NSVT  - Nonsustained continues to have some PVCs - started on  low-dose Toprol -XL 25 daily -2D echo with a preserved EF 60 to 65%  Acute on chronic HFpEF last echo 60-65% with cor pulmonale - echo shows EF similar with slightly flattened interventricular septum and moderately elevated PASP ,Right-sided elevated PASP -On diuresis, dose daily as needed, will need strict ins and out -Significant drop in weight 211 on admission to 192 this morning. - Continue with diuresis as needed, will give extra dose of IV Lasix  today  Previous acute PEs in 2022 - Appears to have completed treatment-no role for further anticoagulation currently  Possible OSA at baseline - Requires outpatient workup  Chronic liver disease - This has been reported in the past and needs to be followed up with periodic LFTs when he is better  Impaired glucose tolerance with A1c of 5.6 and variable between 5 and 6 range -Sugars predominantly are below 180 -Needs CBG coverage at this time no sliding scale is required-as an outpatient may benefit from Farxiga  if can afford  Moderate hypokalemia -resolved  Hypokalemia  -replaced    DVT prophylaxis: (Lovenox ) Code Status: (Full) Family Communication: (none at bedside) Disposition:   Status is: Inpatient    Consultants:  none   Subjective:  Report dyspnea has improved, still reports congestion, no fever,  Objective:  Vitals:   04/04/24 0400 04/04/24 0500 04/04/24 0750 04/04/24 0810  BP: 123/70   (!) 149/87  Pulse:    72  Resp: 15   18  Temp:    97.7 F (36.5 C)  TempSrc:    Oral  SpO2: 100%  97% 94%  Weight:  87.4 kg      Intake/Output Summary (Last 24 hours) at 04/04/2024 1034 Last data filed at 04/04/2024  0506 Gross per 24 hour  Intake 3 ml  Output 3400 ml  Net -3397 ml   Filed Weights   04/01/24 0733 04/03/24 0413 04/04/24 0500  Weight: 96 kg 88.3 kg 87.4 kg    Examination:  Awake Alert, Oriented X 3, No new F.N deficits, Normal affect, frail Symmetrical Chest wall movement, no wheezing, but scattered Rales RRR,No Gallops,Rubs or new Murmurs, No Parasternal Heave +ve B.Sounds, Abd Soft, No tenderness, No rebound - guarding or rigidity. No Cyanosis, Clubbing or edema, No new Rash or bruise       Data Reviewed: I have personally reviewed following labs and imaging studies  CBC: Recent Labs  Lab 03/29/24 1757 03/29/24 1810 03/30/24 0500 04/01/24 0537 04/02/24 0332 04/03/24 0447 04/04/24 0351  WBC 2.2*  --  2.6* 3.5* 3.6* 4.0 3.5*  NEUTROABS 1.2*  --   --  2.5 2.8 2.9 2.7  HGB 13.7   < > 13.6 13.8 14.2 14.6 15.3  HCT 45.8   < > 45.1 45.7 46.1 48.5 50.3  MCV 101.1*  --  99.8 99.6 99.6 100.0 98.8  PLT 109*  --  119* 140* 131* 145* 148*   < > = values in this interval not displayed.    Basic Metabolic Panel: Recent Labs  Lab 03/31/24 1125 04/01/24 0537 04/02/24 0332 04/03/24 0447 04/04/24 0351  NA 137 135 134* 134* 133*  K 3.9 3.2* 3.9 5.0 4.7  CL 88* 83* 88* 92* 93*  CO2 34* 40* 35* 32 31  GLUCOSE 121* 118* 124* 133* 124*  BUN 18 18 19  36* 41*  CREATININE 1.15 1.05 1.33* 1.49* 1.12  CALCIUM 8.6* 8.8* 9.1 9.1 9.6  MG  --  1.7  --   --   --   PHOS  --  3.4  --   --   --     GFR: CrCl cannot be calculated (Unknown ideal weight.).  Liver Function Tests: Recent Labs  Lab 03/30/24 0500 03/31/24 1125  AST 28 34  ALT 11 11  ALKPHOS 45 44  BILITOT 2.1* 2.0*  PROT 7.5 7.4  ALBUMIN 3.5 3.7    CBG: Recent Labs  Lab 03/30/24 1933 03/30/24 2123 03/31/24 0805 03/31/24 1144 03/31/24 1553  GLUCAP 148* 113* 100* 125* 117*     Recent Results (from the past 240 hours)  Resp panel by RT-PCR (RSV, Flu A&B, Covid) Anterior Nasal Swab     Status: Abnormal    Collection Time: 03/29/24  6:12 PM   Specimen: Anterior Nasal Swab  Result Value Ref Range Status   SARS Coronavirus 2 by RT PCR POSITIVE (A) NEGATIVE Final   Influenza A by PCR NEGATIVE NEGATIVE Final   Influenza B by PCR NEGATIVE NEGATIVE Final    Comment: (NOTE) The Xpert Xpress SARS-CoV-2/FLU/RSV plus assay is intended as an aid in the diagnosis of influenza from Nasopharyngeal swab specimens and should not be used as a sole basis for treatment. Nasal washings and aspirates are unacceptable for Xpert Xpress SARS-CoV-2/FLU/RSV testing.  Fact Sheet for Patients: BloggerCourse.com  Fact Sheet  for Healthcare Providers: SeriousBroker.it  This test is not yet approved or cleared by the [place]  FDA and has been authorized for detection and/or diagnosis of SARS-CoV-2 by FDA under an Emergency Use Authorization (EUA). This EUA will remain in effect (meaning this test can be used) for the duration of the COVID-19 declaration under Section 564(b)(1) of the Act, 21 U.S.C. section 360bbb-3(b)(1), unless the authorization is terminated or revoked.     Resp Syncytial Virus by PCR NEGATIVE NEGATIVE Final    Comment: (NOTE) Fact Sheet for Patients: BloggerCourse.com  Fact Sheet for Healthcare Providers: SeriousBroker.it  This test is not yet approved or cleared by the [place]  FDA and has been authorized for detection and/or diagnosis of SARS-CoV-2 by FDA under an Emergency Use Authorization (EUA). This EUA will remain in effect (meaning this test can be used) for the duration of the COVID-19 declaration under Section 564(b)(1) of the Act, 21 U.S.C. section 360bbb-3(b)(1), unless the authorization is terminated or revoked.  Performed at [facility], [address] [place], [place] [address]   Blood Culture (routine x 2)     Status: None   Collection Time: 03/29/24   7:02 PM   Specimen: BLOOD RIGHT ARM  Result Value Ref Range Status   Specimen Description BLOOD RIGHT ARM  Final   Special Requests   Final    BOTTLES DRAWN AEROBIC AND ANAEROBIC Blood Culture adequate volume   Culture   Final    NO GROWTH 5 DAYS Performed at [facility], [address] [place], [place] [address]    Report Status 04/03/2024 FINAL  Final  Blood Culture (routine x 2)     Status: None   Collection Time: 03/29/24  7:02 PM   Specimen: BLOOD LEFT ARM  Result Value Ref Range Status   Specimen Description BLOOD LEFT ARM  Final   Special Requests   Final    BOTTLES DRAWN AEROBIC AND ANAEROBIC Blood Culture adequate volume   Culture   Final    NO GROWTH 5 DAYS Performed at [facility], [address], [place], [place] [address]    Report Status 04/03/2024 FINAL  Final         Radiology Studies: DG Chest 2 View Result Date: 04/04/2024 EXAM: 2 VIEW(S) XRAY OF THE CHEST 04/04/2024 05:47:14 AM COMPARISON: 04/01/2024 CLINICAL HISTORY: Pleural effusion [id number]. Encounter for pleural effusion. FINDINGS: LUNGS AND PLEURA: No focal pulmonary opacity. Mild pulmonary edema. No pleural effusion. No pneumothorax. HEART AND MEDIASTINUM: Stable cardiomegaly. Aortic atherosclerosis. BONES AND SOFT TISSUES: No acute osseous abnormality. IMPRESSION: 1. Mild pulmonary edema. 2. Cardiomegaly. Electronically signed by: Donnice Mania MD 04/04/2024 09:54 AM EDT RP Workstation: [id number]        Scheduled Meds:  dexamethasone  4 mg Oral Daily   enoxaparin  (LOVENOX ) injection  40 mg Subcutaneous Q24H   fluticasone   2 spray Each Nare Daily   fluticasone  furoate-vilanterol  1 puff Inhalation Daily   guaiFENesin   600 mg Oral BID   metoprolol  succinate  25 mg Oral Daily   pantoprazole   40 mg Oral Q1200   sodium chloride  flush  3 mL Intravenous Q12H   sodium chloride  flush  3 mL Intravenous Q12H   Continuous Infusions:   LOS: 6 days      Brayton Lye, MD [facility]   To contact the attending provider between 7A-7P or the covering provider during after hours 7P-7A, please log into the web site [email or web address] and access using  universal [place] password for that web site. If you do not have the password, please call the hospital operator.  04/04/2024, 10:34 AM

## 2024-04-05 DIAGNOSIS — J9601 Acute respiratory failure with hypoxia: Secondary | ICD-10-CM | POA: Diagnosis not present

## 2024-04-05 LAB — CBC WITH DIFFERENTIAL/PLATELET
Abs Immature Granulocytes: 0.04 K/uL (ref 0.00–0.07)
Basophils Absolute: 0 K/uL (ref 0.0–0.1)
Basophils Relative: 0 %
Eosinophils Absolute: 0 K/uL (ref 0.0–0.5)
Eosinophils Relative: 0 %
HCT: 50.7 % (ref 39.0–52.0)
Hemoglobin: 15.7 g/dL (ref 13.0–17.0)
Immature Granulocytes: 1 %
Lymphocytes Relative: 9 %
Lymphs Abs: 0.4 K/uL — ABNORMAL LOW (ref 0.7–4.0)
MCH: 30.1 pg (ref 26.0–34.0)
MCHC: 31 g/dL (ref 30.0–36.0)
MCV: 97.1 fL (ref 80.0–100.0)
Monocytes Absolute: 0.6 K/uL (ref 0.1–1.0)
Monocytes Relative: 14 %
Neutro Abs: 3.5 K/uL (ref 1.7–7.7)
Neutrophils Relative %: 76 %
Platelets: 148 K/uL — ABNORMAL LOW (ref 150–400)
RBC: 5.22 MIL/uL (ref 4.22–5.81)
RDW: 13.5 % (ref 11.5–15.5)
WBC: 4.6 K/uL (ref 4.0–10.5)
nRBC: 0 % (ref 0.0–0.2)

## 2024-04-05 LAB — BASIC METABOLIC PANEL WITH GFR
Anion gap: 9 (ref 5–15)
BUN: 46 mg/dL — ABNORMAL HIGH (ref 8–23)
CO2: 34 mmol/L — ABNORMAL HIGH (ref 22–32)
Calcium: 9.2 mg/dL (ref 8.9–10.3)
Chloride: 91 mmol/L — ABNORMAL LOW (ref 98–111)
Creatinine, Ser: 1.21 mg/dL (ref 0.61–1.24)
GFR, Estimated: >60 mL/min (ref >60)
Glucose, Bld: 109 mg/dL — ABNORMAL HIGH (ref 70–99)
Potassium: 4.4 mmol/L (ref 3.5–5.1)
Sodium: 134 mmol/L — ABNORMAL LOW (ref 135–145)

## 2024-04-05 LAB — BRAIN NATRIURETIC PEPTIDE: B Natriuretic Peptide: 190.8 pg/mL — ABNORMAL HIGH (ref 0.0–100.0)

## 2024-04-05 NOTE — Evaluation (Addendum)
 Occupational Therapy Evaluation Patient Details Name: Mitchell King MRN: [id number] DOB: February 08, 1951 Today's Date: 04/05/2024   History of Present Illness   73 y.o. male adm 03/29/24 with medical history significant of CHF, COPD, essential hypertension, non-insulin -dependent DM type II, and chronic anemia presented with worsening shortness of breath, fever and chills.     Clinical Impressions Patient admitted for the diagnosis above.  PTA he lives at home with his son and grandson, who provide supportive assist, including medications and community mobility.  Currently he is having difficulty with processing and memory, presents with weakness and poor dynamic balance.  Patient overall is needing up to Min A for basic mobility and lower body ADL.  OT placed recommendation for SNF, but if family can provide initial 24 hour assist as needed, he may be able to transition home with assist from family and [facility] rehab.  OT will continue efforts in the acute setting.       If plan is discharge home, recommend the following:   A little help with walking and/or transfers;A little help with bathing/dressing/bathroom;Assistance with cooking/housework;Assist for transportation;Direct supervision/assist for medications management     Functional Status Assessment   Patient has had a recent decline in their functional status and demonstrates the ability to make significant improvements in function in a reasonable and predictable amount of time.     Equipment Recommendations   None recommended by OT     Recommendations for Other Services         Precautions/Restrictions   Precautions Precautions: Fall Precaution/Restrictions Comments: Covid, Chronic O2 at home Restrictions Weight Bearing Restrictions Per Provider Order: No     Mobility Bed Mobility               General bed mobility comments: up in the recliner    Transfers Overall transfer level: Needs assistance Equipment  used: Rolling walker (2 wheels) Transfers: Sit to/from Stand Sit to Stand: Contact guard assist                  Balance Overall balance assessment: Needs assistance Sitting-balance support: Feet supported Sitting balance-Leahy Scale: Good     Standing balance support: Reliant on assistive device for balance Standing balance-Leahy Scale: Poor                             ADL either performed or assessed with clinical judgement   ADL Overall ADL's : Needs assistance/impaired Eating/Feeding: Independent;Sitting   Grooming: Set up;Sitting;Wash/dry hands;Wash/dry face   Upper Body Bathing: Set up;Sitting   Lower Body Bathing: Minimal assistance;Sit to/from stand   Upper Body Dressing : Set up;Sitting   Lower Body Dressing: Minimal assistance;Sit to/from stand   Toilet Transfer: Minimal assistance;Rolling walker (2 wheels);Regular Toilet;Ambulation                   Vision Baseline Vision/History: 1 Wears glasses Patient Visual Report: No change from baseline       Perception Perception: Not tested       Praxis Praxis: Not tested       Pertinent Vitals/Pain Pain Assessment Pain Assessment: No/denies pain     Extremity/Trunk Assessment Upper Extremity Assessment Upper Extremity Assessment: Generalized weakness;Right hand dominant;LUE deficits/detail LUE Deficits / Details: decreased end range shoulder flexion LUE Sensation: WNL LUE Coordination: WNL   Lower Extremity Assessment Lower Extremity Assessment: Defer to PT evaluation   Cervical / Trunk Assessment Cervical / Trunk Assessment: Normal  Communication Communication Communication: No apparent difficulties   Cognition Arousal: Alert Behavior During Therapy: WFL for tasks assessed/performed, Flat affect Cognition: Cognition impaired       Memory impairment (select all impairments): Short-term memory Attention impairment (select first level of impairment): Sustained  attention Executive functioning impairment (select all impairments): Sequencing, Reasoning                   Following commands: Impaired Following commands impaired: Follows one step commands with increased time     Cueing  General Comments   Cueing Techniques: Verbal cues;Gestural cues   VSS on O2   Exercises     Shoulder Instructions      Home Living Family/patient expects to be discharged to:: Private residence Living Arrangements: Children Available Help at Discharge: Family;Available PRN/intermittently Type of Home: House Home Access: Stairs to enter [employer] of Steps: 3-4 Entrance Stairs-Rails: Right Home Layout: One level     Bathroom Shower/Tub: Tub/shower unit;Walk-in shower   Bathroom Toilet: Standard Bathroom Accessibility: Yes How Accessible: Accessible via walker Home Equipment: Rollator (4 wheels);Rolling Walker (2 wheels);BSC/3in1;Shower seat;Grab bars - tub/shower          Prior Functioning/Environment Prior Level of Function : Patient poor historian/Family not available               ADLs Comments: 2 L O2 at baseline, states he uses 4WRW for in home mobility, states no assist with bathing or dressing - a little unclear if he gets in the shower, or sink bathes.  Son assists with medications and iADL including meals.  Patient does not drive    OT Problem List: Decreased activity tolerance;Decreased strength;Impaired balance (sitting and/or standing);Decreased safety awareness;Decreased cognition   OT Treatment/Interventions: Self-care/ADL training;Therapeutic activities;Patient/family education;Balance training;DME and/or AE instruction      OT Goals(Current goals can be found in the care plan section)   Acute Rehab OT Goals Patient Stated Goal: Return home OT Goal Formulation: With patient Time For Goal Achievement: 04/19/24 Potential to Achieve Goals: Good ADL Goals Pt Will Perform Grooming: with  supervision;standing Pt Will Perform Lower Body Dressing: with supervision;sit to/from stand Pt Will Transfer to Toilet: with supervision;ambulating;regular height toilet   OT Frequency:  Min 2X/week    Co-evaluation              AM-PAC OT 6 Clicks Daily Activity     Outcome Measure Help from another person eating meals?: None Help from another person taking care of personal grooming?: None Help from another person toileting, which includes using toliet, bedpan, or urinal?: A Little Help from another person bathing (including washing, rinsing, drying)?: A Little Help from another person to put on and taking off regular upper body clothing?: None Help from another person to put on and taking off regular lower body clothing?: A Little 6 Click Score: 21   End of Session Equipment Utilized During Treatment: Rolling walker (2 wheels);Oxygen  Nurse Communication: Mobility status  Activity Tolerance: Patient tolerated treatment well Patient left: in chair;with call bell/phone within reach;with chair alarm set  OT Visit Diagnosis: Unsteadiness on feet (R26.81);Muscle weakness (generalized) (M62.81)                Time: 8875-8854 OT Time Calculation (min): 21 min Charges:  OT General Charges $OT Visit: 1 Visit OT Evaluation $OT Eval Moderate Complexity: 1 Mod  04/05/2024  RP, OTR/L  Acute Rehabilitation Services  Office:  [phone]   Charlie JONETTA Halsted 04/05/2024, 11:59 AM

## 2024-04-05 NOTE — Evaluation (Signed)
 RT Evaluate and Treat Note  04/05/2024   Breathing is (select one): Same as normal    The following was found on auscultation (select multiple):  Bilateral Breath Sounds: Diminished;Expiratory wheezes (04/05/24 0755)  R Upper  Breath Sounds: Diminished (04/04/24 0800) L Upper Breath Sounds: Diminished (04/04/24 0800) R Lower Breath Sounds: Diminished (04/04/24 0800) L Lower Breath Sounds: Diminished (04/04/24 0800)    Cough Assessment: Cough: Congested;Strong (04/04/24 2030)    Most Recent Chest Xray:... (No results found.    The following medications and/or interventions were ordered/changed/discontinued as part of the Respiratory Treatment protocol:   Medication Changes: None   Airway Clearance Changes: None   Oxygen  Therapy Changes: Pt currently on 4l [place].

## 2024-04-05 NOTE — Plan of Care (Signed)
  Problem: Education: Goal: Ability to describe self-care measures that may prevent or decrease complications (Diabetes Survival Skills Education) will improve Outcome: Progressing Goal: Individualized Educational Video(s) Outcome: Progressing   Problem: Coping: Goal: Ability to adjust to condition or change in health will improve Outcome: Progressing   Problem: Fluid Volume: Goal: Ability to maintain a balanced intake and output will improve Outcome: Progressing   Problem: Health Behavior/Discharge Planning: Goal: Ability to identify and utilize available resources and services will improve Outcome: Progressing Goal: Ability to manage health-related needs will improve Outcome: Progressing   Problem: Metabolic: Goal: Ability to maintain appropriate glucose levels will improve Outcome: Progressing   Problem: Nutritional: Goal: Maintenance of adequate nutrition will improve Outcome: Progressing Goal: Progress toward achieving an optimal weight will improve Outcome: Progressing   Problem: Skin Integrity: Goal: Risk for impaired skin integrity will decrease Outcome: Progressing   Problem: Tissue Perfusion: Goal: Adequacy of tissue perfusion will improve Outcome: Progressing   Problem: Education: Goal: Knowledge of General Education information will improve Description: Including pain rating scale, medication(s)/side effects and non-pharmacologic comfort measures Outcome: Progressing   Problem: Health Behavior/Discharge Planning: Goal: Ability to manage health-related needs will improve Outcome: Progressing   Problem: Clinical Measurements: Goal: Ability to maintain clinical measurements within normal limits will improve Outcome: Progressing Goal: Will remain free from infection Outcome: Progressing Goal: Diagnostic test results will improve Outcome: Progressing Goal: Respiratory complications will improve Outcome: Progressing Goal: Cardiovascular complication will  be avoided Outcome: Progressing   Problem: Activity: Goal: Risk for activity intolerance will decrease Outcome: Progressing   Problem: Nutrition: Goal: Adequate nutrition will be maintained Outcome: Progressing   Problem: Coping: Goal: Level of anxiety will decrease Outcome: Progressing   Problem: Elimination: Goal: Will not experience complications related to bowel motility Outcome: Progressing Goal: Will not experience complications related to urinary retention Outcome: Progressing   Problem: Pain Managment: Goal: General experience of comfort will improve and/or be controlled Outcome: Progressing   Problem: Safety: Goal: Ability to remain free from injury will improve Outcome: Progressing   Problem: Skin Integrity: Goal: Risk for impaired skin integrity will decrease Outcome: Progressing   Problem: Education: Goal: Knowledge of disease or condition will improve Outcome: Progressing Goal: Knowledge of the prescribed therapeutic regimen will improve Outcome: Progressing Goal: Individualized Educational Video(s) Outcome: Progressing

## 2024-04-05 NOTE — Evaluation (Signed)
 Physical Therapy Evaluation Patient Details Name: Mitchell King MRN: [id number] DOB: 03/21/1951 Today's Date: 04/05/2024  History of Present Illness  73 y.o. male adm 03/29/24 with medical history significant of CHF, COPD, essential hypertension, non-insulin -dependent DM type II, and chronic anemia presented with worsening shortness of breath, fever and chills.  Clinical Impression  Pt presents with admitting diagnosis above. Pt today appeared to be somewhat confused believing that he was already in the bed when he was sitting up in the recliner. CGA/Min A to return to bed. Pt with 1 episode of knee buckling during transfer requiring Min A to correct. Unsure of pt baseline as he was not a great historian. Patient will benefit from continued inpatient follow up therapy, <3 hours/day however agree with OT that if family can provided 24/7 supervision then pt may DC home with [facility]. PT will continue to follow.         If plan is discharge home, recommend the following: A little help with walking and/or transfers;A little help with bathing/dressing/bathroom;Assistance with cooking/housework;Direct supervision/assist for medications management;Assist for transportation;Supervision due to cognitive status;Help with stairs or ramp for entrance   Can travel by private vehicle   Yes    Equipment Recommendations None recommended by PT  Recommendations for Other Services       Functional Status Assessment Patient has had a recent decline in their functional status and demonstrates the ability to make significant improvements in function in a reasonable and predictable amount of time.     Precautions / Restrictions Precautions Precautions: Fall Precaution/Restrictions Comments: Covid, Chronic O2 at home Restrictions Weight Bearing Restrictions Per Provider Order: No      Mobility  Bed Mobility Overal bed mobility: Needs Assistance Bed Mobility: Sit to Supine       Sit to supine: Contact  guard assist   General bed mobility comments: up in the recliner. CGA to return to bed.    Transfers Overall transfer level: Needs assistance Equipment used: Rolling walker (2 wheels) Transfers: Sit to/from Stand, Bed to chair/wheelchair/BSC Sit to Stand: Contact guard assist   Step pivot transfers: Min assist       General transfer comment: CGA/Min A to return to bed. Pt with 1 episode of knee buckling during transfer requiring Min A to correct.    Ambulation/Gait               General Gait Details: Deferred for safety.  Stairs            Wheelchair Mobility     Tilt Bed    Modified Rankin (Stroke Patients Only)       Balance Overall balance assessment: Needs assistance Sitting-balance support: Feet supported Sitting balance-Leahy Scale: Good     Standing balance support: Reliant on assistive device for balance Standing balance-Leahy Scale: Poor                               Pertinent Vitals/Pain      Home Living Family/patient expects to be discharged to:: Private residence Living Arrangements: Children Available Help at Discharge: Family;Available PRN/intermittently Type of Home: House Home Access: Stairs to enter Entrance Stairs-Rails: Right Entrance Stairs-Number of Steps: 3-4   Home Layout: One level Home Equipment: Rollator (4 wheels);Rolling Walker (2 wheels);BSC/3in1;Shower seat;Grab bars - tub/shower      Prior Function Prior Level of Function : Patient poor historian/Family not available  ADLs Comments: 2 L O2 at baseline, states he uses 4WRW for in home mobility, states no assist with bathing or dressing - a little unclear if he gets in the shower, or sink bathes.  Son assists with medications and iADL including meals.  Patient does not drive     Extremity/Trunk Assessment   Upper Extremity Assessment LUE Deficits / Details: decreased end range shoulder flexion LUE Sensation: WNL LUE  Coordination: WNL    Lower Extremity Assessment Lower Extremity Assessment: Generalized weakness    Cervical / Trunk Assessment Cervical / Trunk Assessment: Normal  Communication   Communication Communication: No apparent difficulties    Cognition Arousal: Alert Behavior During Therapy: WFL for tasks assessed/performed, Flat affect                             Following commands: Impaired Following commands impaired: Follows one step commands with increased time     Cueing Cueing Techniques: Verbal cues, Gestural cues     General Comments General comments (skin integrity, edema, etc.): VSS    Exercises     Assessment/Plan    PT Assessment Patient needs continued PT services  PT Problem List Decreased strength;Decreased range of motion;Decreased activity tolerance;Decreased balance;Decreased mobility;Decreased coordination;Decreased cognition;Decreased knowledge of use of DME;Decreased safety awareness;Decreased knowledge of precautions;Cardiopulmonary status limiting activity       PT Treatment Interventions DME instruction;Gait training;Stair training;Functional mobility training;Therapeutic activities;Therapeutic exercise;Balance training;Neuromuscular re-education;Cognitive remediation;Patient/family education;Wheelchair mobility training    PT Goals (Current goals can be found in the Care Plan section)  Acute Rehab PT Goals PT Goal Formulation: Patient unable to participate in goal setting Time For Goal Achievement: 04/19/24 Potential to Achieve Goals: Fair    Frequency Min 2X/week     Co-evaluation               AM-PAC PT 6 Clicks Mobility  Outcome Measure Help needed turning from your back to your side while in a flat bed without using bedrails?: A Little Help needed moving from lying on your back to sitting on the side of a flat bed without using bedrails?: A Little Help needed moving to and from a bed to a chair (including a  wheelchair)?: A Little Help needed standing up from a chair using your arms (e.g., wheelchair or bedside chair)?: A Little Help needed to walk in hospital room?: A Lot Help needed climbing 3-5 steps with a railing? : Total 6 Click Score: 15    End of Session Equipment Utilized During Treatment: Gait belt Activity Tolerance: Patient tolerated treatment well Patient left: in bed;with call bell/phone within reach;with bed alarm set Nurse Communication: Mobility status PT Visit Diagnosis: Other abnormalities of gait and mobility (R26.89)    Time: 1454-1510 PT Time Calculation (min) (ACUTE ONLY): 16 min   Charges:   PT Evaluation $PT Eval Moderate Complexity: 1 Mod   PT General Charges $$ ACUTE PT VISIT: 1 Visit         Sueellen NOVAK, PT, DPT Acute Rehab Services [phone]   Nichalos Brenton 04/05/2024, 4:05 PM

## 2024-04-05 NOTE — Progress Notes (Signed)
 PROGRESS NOTE    Mitchell King  FMW:[id number] DOB: Nov 04, 1950 DOA: 03/29/2024 PCP: No primary care provider on file.   Chief Complaint  Patient presents with   Shortness of Breath    Brief Narrative:   73 year old male known to have chronic respiratory failure secondary to COPD-prior tobacco-currently follows with Dr. 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 previously admitted with hyperammonemia 07/2020   Came in from home 10/9 with 2 weeks of worsening chills rigors diminished breath sounds given Solu-Medrol  in ED requiring more than 2 L of his baseline to maintain sats in the 90s-requiring 5 L and was placed on BiPAP ultimately weaned off of it in the ED Sodium 136 potassium 3.2 BUN/creatinine 7/1.1 GFR 60 BNP 1440 Lactic acid 2.2 cycling to 1.8 WBC 2.2 hemoglobin 13.7 platelet 109 differential normal Legionella pending expectorated sputum pending echo pending   COVID-positive but CRP 0.9-given a dose of remdesivir Decadron ceftriaxone and maintained only on 10/12 x-ray repeated shows emphysema and no infiltrates   Assessment & Plan:   Principal Problem:   Acute hypoxic respiratory failure (HCC) Active Problems:   COPD with acute exacerbation (HCC)   COVID-19 virus infection   Acute exacerbation of CHF (congestive heart failure) (HCC)   Essential hypertension   Non-insulin  dependent type 2 diabetes mellitus (HCC)  Acute on chronic respiratory failure with hypoxia COVID-19 infection  COPD -He is on 2 L oxygen  at home, remains on 5 L oxygen  this morning. - Encouraged use incentive spirometer and flutter valve again -Given worsening hypoxia, continue with Decadron . - He was encouraged use incentive spirometry and flutter valve . - Followed by Dr. Kara in the outpatient setting and  chronically on 2 -Continues Flonase 2 spray daily scheduled, Mucinex  600 twice daily,   DuoNeb-add Breo Ellipta - Steroids as above for now   NSVT  - Nonsustained continues to have some PVCs - started on  low-dose Toprol -XL 25 daily -2D echo with a preserved EF 60 to 65%  Acute on chronic HFpEF last echo 60-65% with cor pulmonale - echo shows EF similar with slightly flattened interventricular septum and moderately elevated PASP ,Right-sided elevated PASP -On diuresis, dose daily as needed, will need strict ins and out -Significant drop in weight 211 on admission to 192 this morning. - Continue with diuresis as needed, will give extra dose of IV Lasix  today  Previous acute PEs in 2022 - Appears to have completed treatment-no role for further anticoagulation currently  Possible OSA at baseline - Requires outpatient workup  Hospital delirium -patient is significantly confused and altered this morning, most likely in setting of hospital delirium, discussed with staff, will try to get out of bed to chair, ambulate, open the curtains   Chronic liver disease - This has been reported in the past and needs to be followed up with periodic LFTs when he is better  Impaired glucose tolerance with A1c of 5.6 and variable between 5 and 6 range -Sugars predominantly are below 180 -Needs CBG coverage at this time no sliding scale is required-as an outpatient may benefit from Farxiga  if can afford  Moderate hypokalemia -resolved  Hypokalemia  -replaced    DVT prophylaxis: (Lovenox ) Code Status: (Full) Family Communication: (none at bedside) Disposition:   Status is: Inpatient  Consultants:  none   Subjective:  Some confusion overnight, otherwise himself denies any complaints Objective: Vitals:   04/05/24 0500 04/05/24 0755 04/05/24 0853 04/05/24 1008  BP:   132/77   Pulse:  65 70 69  Resp:  12 20   Temp:   97.7 F (36.5 C)   TempSrc:   Oral   SpO2:  100% 100% 98%   Weight: 85.8 kg       Intake/Output Summary (Last 24 hours) at 04/05/2024 1131 Last data filed at 04/05/2024 0959 Gross per 24 hour  Intake 483 ml  Output 1800 ml  Net -1317 ml   Filed Weights   04/03/24 0413 04/04/24 0500 04/05/24 0500  Weight: 88.3 kg 87.4 kg 85.8 kg    Examination:  Awake Alert, he is confused today, restless Symmetrical Chest wall movement, scattered rhonchi RRR,No Gallops,Rubs or new Murmurs, No Parasternal Heave +ve B.Sounds, Abd Soft, No tenderness, No rebound - guarding or rigidity. No Cyanosis, Clubbing or edema, No new Rash or bruise       Data Reviewed: I have personally reviewed following labs and imaging studies  CBC: Recent Labs  Lab 04/01/24 0537 04/02/24 0332 04/03/24 0447 04/04/24 0351 04/05/24 0401  WBC 3.5* 3.6* 4.0 3.5* 4.6  NEUTROABS 2.5 2.8 2.9 2.7 3.5  HGB 13.8 14.2 14.6 15.3 15.7  HCT 45.7 46.1 48.5 50.3 50.7  MCV 99.6 99.6 100.0 98.8 97.1  PLT 140* 131* 145* 148* 148*    Basic Metabolic Panel: Recent Labs  Lab 04/01/24 0537 04/02/24 0332 04/03/24 0447 04/04/24 0351 04/05/24 0401  NA 135 134* 134* 133* 134*  K 3.2* 3.9 5.0 4.7 4.4  CL 83* 88* 92* 93* 91*  CO2 40* 35* 32 31 34*  GLUCOSE 118* 124* 133* 124* 109*  BUN 18 19 36* 41* 46*  CREATININE 1.05 1.33* 1.49* 1.12 1.21  CALCIUM 8.8* 9.1 9.1 9.6 9.2  MG 1.7  --   --   --   --   PHOS 3.4  --   --   --   --     GFR: CrCl cannot be calculated (Unknown ideal weight.).  Liver Function Tests: Recent Labs  Lab 03/30/24 0500 03/31/24 1125  AST 28 34  ALT 11 11  ALKPHOS 45 44  BILITOT 2.1* 2.0*  PROT 7.5 7.4  ALBUMIN 3.5 3.7    CBG: Recent Labs  Lab 03/30/24 1933 03/30/24 2123 03/31/24 0805 03/31/24 1144 03/31/24 1553  GLUCAP 148* 113* 100* 125* 117*     Recent Results (from the past 240 hours)  Resp panel by RT-PCR (RSV, Flu A&B, Covid) Anterior Nasal Swab     Status: Abnormal   Collection Time: 03/29/24  6:12 PM   Specimen: Anterior  Nasal Swab  Result Value Ref Range Status   SARS Coronavirus 2 by RT PCR POSITIVE (A) NEGATIVE Final   Influenza A by PCR NEGATIVE NEGATIVE Final   Influenza B by PCR NEGATIVE NEGATIVE Final    Comment: (NOTE) The Xpert Xpress SARS-CoV-2/FLU/RSV plus assay is intended as an aid in the diagnosis of influenza from Nasopharyngeal swab specimens and should not be used as a sole basis for treatment. Nasal washings and aspirates are unacceptable for Xpert Xpress SARS-CoV-2/FLU/RSV testing.  Fact Sheet for Patients: BloggerCourse.com  Fact Sheet for Healthcare Providers: SeriousBroker.it  This test is not yet approved or cleared by the [place]  FDA and has been authorized for detection and/or diagnosis of SARS-CoV-2 by FDA under an Emergency Use Authorization (EUA).  This EUA will remain in effect (meaning this test can be used) for the duration of the COVID-19 declaration under Section 564(b)(1) of the Act, 21 U.S.C. section 360bbb-3(b)(1), unless the authorization is terminated or revoked.     Resp Syncytial Virus by PCR NEGATIVE NEGATIVE Final    Comment: (NOTE) Fact Sheet for Patients: BloggerCourse.com  Fact Sheet for Healthcare Providers: SeriousBroker.it  This test is not yet approved or cleared by the [place]  FDA and has been authorized for detection and/or diagnosis of SARS-CoV-2 by FDA under an Emergency Use Authorization (EUA). This EUA will remain in effect (meaning this test can be used) for the duration of the COVID-19 declaration under Section 564(b)(1) of the Act, 21 U.S.C. section 360bbb-3(b)(1), unless the authorization is terminated or revoked.  Performed at [facility], [address], [place], [place] [address]   Blood Culture (routine x 2)     Status: None   Collection Time: 03/29/24  7:02 PM   Specimen: BLOOD RIGHT ARM  Result Value Ref  Range Status   Specimen Description BLOOD RIGHT ARM  Final   Special Requests   Final    BOTTLES DRAWN AEROBIC AND ANAEROBIC Blood Culture adequate volume   Culture   Final    NO GROWTH 5 DAYS Performed at [facility], [address], [place], [place] [address]    Report Status 04/03/2024 FINAL  Final  Blood Culture (routine x 2)     Status: None   Collection Time: 03/29/24  7:02 PM   Specimen: BLOOD LEFT ARM  Result Value Ref Range Status   Specimen Description BLOOD LEFT ARM  Final   Special Requests   Final    BOTTLES DRAWN AEROBIC AND ANAEROBIC Blood Culture adequate volume   Culture   Final    NO GROWTH 5 DAYS Performed at [facility], [address] [place], [place] [address]    Report Status 04/03/2024 FINAL  Final         Radiology Studies: DG Chest 2 View Result Date: 04/04/2024 EXAM: 2 VIEW(S) XRAY OF THE CHEST 04/04/2024 05:47:14 AM COMPARISON: 04/01/2024 CLINICAL HISTORY: Pleural effusion [id number]. Encounter for pleural effusion. FINDINGS: LUNGS AND PLEURA: No focal pulmonary opacity. Mild pulmonary edema. No pleural effusion. No pneumothorax. HEART AND MEDIASTINUM: Stable cardiomegaly. Aortic atherosclerosis. BONES AND SOFT TISSUES: No acute osseous abnormality. IMPRESSION: 1. Mild pulmonary edema. 2. Cardiomegaly. Electronically signed by: Donnice Mania MD 04/04/2024 09:54 AM EDT RP Workstation: [id number]        Scheduled Meds:  arformoterol   15 mcg Nebulization BID   budesonide  (PULMICORT ) nebulizer solution  0.25 mg Nebulization BID   dexamethasone  4 mg Oral Daily   enoxaparin  (LOVENOX ) injection  40 mg Subcutaneous Q24H   fluticasone   2 spray Each Nare Daily   guaiFENesin   600 mg Oral BID   metoprolol  succinate  25 mg Oral Daily   pantoprazole   40 mg Oral Q1200   sodium chloride  flush  3 mL Intravenous Q12H   sodium chloride  flush  3 mL Intravenous Q12H   Continuous Infusions:   LOS: 7 days      Brayton Lye, MD [facility]   To contact the attending provider between 7A-7P or the covering provider during after hours 7P-7A, please log into the web site [email or web address] and access using universal [place] password for that web site. If you do not have the password, please call the hospital operator.  04/05/2024, 11:31 AM

## 2024-04-06 DIAGNOSIS — J9601 Acute respiratory failure with hypoxia: Secondary | ICD-10-CM | POA: Diagnosis not present

## 2024-04-06 LAB — CBC WITH DIFFERENTIAL/PLATELET
Abs Immature Granulocytes: 0.04 K/uL (ref 0.00–0.07)
Basophils Absolute: 0 K/uL (ref 0.0–0.1)
Basophils Relative: 0 %
Eosinophils Absolute: 0 K/uL (ref 0.0–0.5)
Eosinophils Relative: 0 %
HCT: 54.1 % — ABNORMAL HIGH (ref 39.0–52.0)
Hemoglobin: 16.7 g/dL (ref 13.0–17.0)
Immature Granulocytes: 1 %
Lymphocytes Relative: 12 %
Lymphs Abs: 0.6 K/uL — ABNORMAL LOW (ref 0.7–4.0)
MCH: 29.8 pg (ref 26.0–34.0)
MCHC: 30.9 g/dL (ref 30.0–36.0)
MCV: 96.6 fL (ref 80.0–100.0)
Monocytes Absolute: 1 K/uL (ref 0.1–1.0)
Monocytes Relative: 18 %
Neutro Abs: 3.5 K/uL (ref 1.7–7.7)
Neutrophils Relative %: 69 %
Platelets: 162 K/uL (ref 150–400)
RBC: 5.6 MIL/uL (ref 4.22–5.81)
RDW: 13.5 % (ref 11.5–15.5)
WBC: 5.2 K/uL (ref 4.0–10.5)
nRBC: 0 % (ref 0.0–0.2)

## 2024-04-06 LAB — BASIC METABOLIC PANEL WITH GFR
Anion gap: 12 (ref 5–15)
BUN: 47 mg/dL — ABNORMAL HIGH (ref 8–23)
CO2: 33 mmol/L — ABNORMAL HIGH (ref 22–32)
Calcium: 9.6 mg/dL (ref 8.9–10.3)
Chloride: 92 mmol/L — ABNORMAL LOW (ref 98–111)
Creatinine, Ser: 1.25 mg/dL — ABNORMAL HIGH (ref 0.61–1.24)
GFR, Estimated: >60 mL/min (ref >60)
Glucose, Bld: 132 mg/dL — ABNORMAL HIGH (ref 70–99)
Potassium: 4.1 mmol/L (ref 3.5–5.1)
Sodium: 137 mmol/L (ref 135–145)

## 2024-04-06 LAB — PHOSPHORUS: Phosphorus: 2.9 mg/dL (ref 2.5–4.6)

## 2024-04-06 LAB — MAGNESIUM: Magnesium: 2.3 mg/dL (ref 1.7–2.4)

## 2024-04-06 MED ORDER — TAMSULOSIN HCL 0.4 MG PO CAPS
0.4000 mg | ORAL_CAPSULE | Freq: Every day | ORAL | Status: DC
  Administered 2024-04-06 – 2024-04-10 (×5): 0.4 mg via ORAL
  Filled 2024-04-06 (×5): qty 1

## 2024-04-06 MED ORDER — UMECLIDINIUM BROMIDE 62.5 MCG/ACT IN AEPB
1.0000 | INHALATION_SPRAY | Freq: Every day | RESPIRATORY_TRACT | Status: DC
  Administered 2024-04-08: 1 via RESPIRATORY_TRACT
  Filled 2024-04-06: qty 7

## 2024-04-06 MED ORDER — TORSEMIDE 20 MG PO TABS
20.0000 mg | ORAL_TABLET | Freq: Every day | ORAL | Status: DC
  Administered 2024-04-06 – 2024-04-08 (×3): 20 mg via ORAL
  Filled 2024-04-06 (×3): qty 1

## 2024-04-06 MED ORDER — EMPAGLIFLOZIN 25 MG PO TABS
25.0000 mg | ORAL_TABLET | Freq: Every day | ORAL | Status: DC
  Administered 2024-04-06 – 2024-04-08 (×3): 25 mg via ORAL
  Filled 2024-04-06 (×4): qty 1

## 2024-04-06 MED ORDER — PANTOPRAZOLE SODIUM 40 MG PO TBEC
40.0000 mg | DELAYED_RELEASE_TABLET | Freq: Every day | ORAL | Status: DC
  Administered 2024-04-06 – 2024-04-10 (×5): 40 mg via ORAL
  Filled 2024-04-06 (×4): qty 1

## 2024-04-06 MED ORDER — ENOXAPARIN SODIUM 40 MG/0.4ML IJ SOSY
40.0000 mg | PREFILLED_SYRINGE | INTRAMUSCULAR | Status: DC
  Administered 2024-04-06 – 2024-04-09 (×4): 40 mg via SUBCUTANEOUS
  Filled 2024-04-06 (×4): qty 0.4

## 2024-04-06 MED ORDER — LACTULOSE 10 GM/15ML PO SOLN
30.0000 g | Freq: Two times a day (BID) | ORAL | Status: DC
  Administered 2024-04-06 – 2024-04-10 (×9): 30 g via ORAL
  Filled 2024-04-06 (×10): qty 45

## 2024-04-06 MED ORDER — ARFORMOTEROL TARTRATE 15 MCG/2ML IN NEBU
15.0000 ug | INHALATION_SOLUTION | Freq: Two times a day (BID) | RESPIRATORY_TRACT | Status: DC
  Administered 2024-04-06 – 2024-04-10 (×8): 15 ug via RESPIRATORY_TRACT
  Filled 2024-04-06 (×8): qty 2

## 2024-04-06 NOTE — NC FL2 (Signed)
 [place]  MEDICAID FL2 LEVEL OF CARE FORM     IDENTIFICATION  Patient Name: Mitchell King Birthdate: 1950/08/06 Sex: male Admission Date (Current Location): 03/29/2024  [facility] and [place] Number:  Producer, television/film/video and Address:  [facility], [address] [place], [place] [address]      Provider Number: [id number]  Attending Physician Name and Address:  Elgergawy, Brayton RAMAN, MD  Relative Name and Phone Number:       Current Level of Care: Hospital Recommended Level of Care: Skilled Nursing Facility Prior Approval Number:    Date Approved/Denied:   PASRR Number: [id number]  Discharge Plan: SNF    Current Diagnoses: Patient Active Problem List   Diagnosis Date Noted   COVID-19 virus infection 03/29/2024   CHF exacerbation (HCC) 04/15/2021   Acute congestive heart failure (HCC) 04/13/2021   Anemia 04/13/2021   Malnutrition of moderate degree 08/14/2020   Hyperammonemia    Acute hypoxic respiratory failure (HCC)    Acute cor pulmonale (HCC) 08/06/2020   History of pulmonary embolus (PE) 08/06/2020   Right heart failure (HCC) 08/06/2020   Acute exacerbation of CHF (congestive heart failure) (HCC) 08/04/2020   Essential hypertension 08/04/2020   Non-insulin  dependent type 2 diabetes mellitus (HCC) 08/04/2020   Nondependent alcohol abuse, continuous drinking behavior 08/04/2020   Acute hepatitis C 10/27/2001   COPD with acute exacerbation (HCC) 08/21/2001    Orientation RESPIRATION BLADDER Height & Weight     Self, Time, Situation, Place  O2 (Nasal cannula 2L) Incontinent, External catheter Weight: 189 lb 13.1 oz (86.1 kg) Height:     BEHAVIORAL SYMPTOMS/MOOD NEUROLOGICAL BOWEL NUTRITION STATUS      Continent Diet (See dc summary)  AMBULATORY STATUS COMMUNICATION OF NEEDS Skin   Extensive Assist Verbally Normal                       Personal Care Assistance Level of Assistance  Bathing, Feeding, Dressing Bathing Assistance:  Maximum assistance Feeding assistance: Limited assistance Dressing Assistance: Limited assistance     Functional Limitations Info  Sight, Hearing Sight Info: Impaired Hearing Info: Impaired      SPECIAL CARE FACTORS FREQUENCY  PT (By licensed PT), OT (By licensed OT)     PT Frequency: 5x/week OT Frequency: 5x/week            Contractures Contractures Info: Not present    Additional Factors Info  Code Status, Allergies, Isolation Precautions Code Status Info: Full Allergies Info: NKA     Isolation Precautions Info: COVID+ 03/29/24     Current Medications (04/06/2024):  This is the current hospital active medication list Current Facility-Administered Medications  Medication Dose Route Frequency Provider Last Rate Last Admin   acetaminophen  (TYLENOL ) tablet 650 mg  650 mg Oral Q6H PRN Sundil, Subrina, MD   650 mg at 04/04/24 0935   Or   acetaminophen  (TYLENOL ) suppository 650 mg  650 mg Rectal Q6H PRN Sundil, Subrina, MD       arformoterol  (BROVANA ) nebulizer solution 15 mcg  15 mcg Nebulization BID Elgergawy, Dawood S, MD       And   umeclidinium bromide  (INCRUSE ELLIPTA ) 62.5 MCG/ACT 1 puff  1 puff Inhalation Daily Elgergawy, Brayton RAMAN, MD       budesonide  (PULMICORT ) nebulizer solution 0.25 mg  0.25 mg Nebulization BID Elgergawy, Dawood S, MD   0.25 mg at 04/05/24 2005   dexamethasone (DECADRON) tablet 4 mg  4 mg Oral Daily Samtani,  Jai-Gurmukh, MD   4 mg at 04/06/24 0932   empagliflozin (JARDIANCE) tablet 25 mg  25 mg Oral Daily Elgergawy, Dawood S, MD   25 mg at 04/06/24 1330   enoxaparin  (LOVENOX ) injection 40 mg  40 mg Subcutaneous Q24H Sundil, Subrina, MD   40 mg at 04/05/24 2115   fluticasone  (FLONASE) 50 MCG/ACT nasal spray 2 spray  2 spray Each Nare Daily Samtani, Jai-Gurmukh, MD   2 spray at 04/06/24 0932   guaiFENesin  (MUCINEX ) 12 hr tablet 600 mg  600 mg Oral BID Sundil, Subrina, MD   600 mg at 04/06/24 0932   ipratropium-albuterol  (DUONEB) 0.5-2.5 (3) MG/3ML  nebulizer solution 3 mL  3 mL Nebulization Q6H PRN Sundil, Subrina, MD       lactulose  (CHRONULAC ) 10 GM/15ML solution 30 g  30 g Oral BID Elgergawy, Dawood S, MD       metoprolol  succinate (TOPROL -XL) 24 hr tablet 25 mg  25 mg Oral Daily Samtani, Jai-Gurmukh, MD   25 mg at 04/06/24 0932   ondansetron  (ZOFRAN ) tablet 4 mg  4 mg Oral Q6H PRN Sundil, Subrina, MD       Or   ondansetron  (ZOFRAN ) injection 4 mg  4 mg Intravenous Q6H PRN Sundil, Subrina, MD       pantoprazole  (PROTONIX ) EC tablet 40 mg  40 mg Oral Q1200 Elgergawy, Dawood S, MD   40 mg at 04/06/24 0932   sodium chloride  flush (NS) 0.9 % injection 3 mL  3 mL Intravenous Q12H Sundil, Subrina, MD   3 mL at 04/06/24 0932   sodium chloride  flush (NS) 0.9 % injection 3 mL  3 mL Intravenous Q12H Sundil, Subrina, MD   3 mL at 04/06/24 0932   sodium chloride  flush (NS) 0.9 % injection 3 mL  3 mL Intravenous PRN Sundil, Subrina, MD       tamsulosin  (FLOMAX ) capsule 0.4 mg  0.4 mg Oral Daily Elgergawy, Dawood S, MD   0.4 mg at 04/06/24 1330   torsemide  (DEMADEX ) tablet 20 mg  20 mg Oral Daily Elgergawy, Dawood S, MD   20 mg at 04/06/24 1330     Discharge Medications: Please see discharge summary for a list of discharge medications.  Relevant Imaging Results:  Relevant Lab Results:   Additional Information SSN [id number] [place], [place]

## 2024-04-06 NOTE — Progress Notes (Addendum)
 [id number]: Patient refused to transfer to the chair.He wanted to eat his breakfast first. He walked to the bathroom earlier with NT using front-wheel walker.   1115AM: Pt transferred to the chair. Chair alarm on.

## 2024-04-06 NOTE — TOC Progression Note (Signed)
 Transition of Care [facility]) - Progression Note    Patient Details  Name: TREYVEON MOCHIZUKI MRN: [id number] Date of Birth: June 03, 1951  Transition of [facility]) CM/SW Contact  Sharyne Drum, Student-Social Work Phone Number: 04/06/2024, 12:38 PM  Clinical Narrative:     12:38 PM MSW Student called patient son to discuss SNF options. Son says that the patient has refused SNF and would like to set up [facility] services instead. I let him know that I would inform the CMRN to be in contact with him to assist with [facility] services. Son says it is okay for [facility] to call anytime, but would like a text first to his cell phone due to his phone blocking calls from unknown numbers. Inpatient care management team will continue to follow for needs.   Expected Discharge Plan: Home/Self Care Barriers to Discharge: Continued Medical Work up               Expected Discharge Plan and Services   Discharge Planning Services: CM Consult   Living arrangements for the past 2 months: Single Family Home                                       Social Drivers of Health (SDOH) Interventions SDOH Screenings   Food Insecurity: No Food Insecurity (03/30/2024)  Housing: Low Risk  (03/30/2024)  Transportation Needs: No Transportation Needs (03/30/2024)  Utilities: Not At Risk (03/30/2024)  Social Connections: Moderately Integrated (03/30/2024)  Tobacco Use: Medium Risk (03/29/2024)    Readmission Risk Interventions     No data to display

## 2024-04-06 NOTE — TOC Progression Note (Addendum)
 Transition of Care [facility]) - Progression Note    Patient Details  Name: Mitchell King MRN: [id number] Date of Birth: Jan 12, 1951  Transition of Care [id number]) CM/SW Contact  Nola Devere Hands, RN Phone Number: 04/06/2024, 1:40 PM  Clinical Narrative:    Patient is a 73 y.o. male adm 03/29/24 with medical history significant of CHF, COPD, essential hypertension, non-insulin -dependent DM type II, and chronic anemia presented with worsening shortness of breath, fever and chills.  Case Manager spoke with patient's son, Mitchell King, [phone] concerning discharge plan. He would like for his dad to come home with Home Health services. CM explained that he will not have daily care providers, and that family participation is needed. Stacy was agreeable for CM to contact a [facility] agency that accepts patient coverage. Referral called to [facility], [facility] Liaison. Patient has RW at home.   1451: Patient's son called Case Manager and stated that his dad needs to go to SNF for rehab prior to coming home, he apologized for making so many changes. CM has updated Child psychotherapist and MD.   Expected Discharge Plan: Home w Home Health Services Barriers to Discharge: Continued Medical Work up               Expected Discharge Plan and Services   Discharge Planning Services: CM Consult Post Acute Care Choice: Home Health Living arrangements for the past 2 months: Single Family Home                           HH Arranged: PT, OT, Nurse's Aide, Social Work [employer] Agency: [facility] Date HH Agency Contacted: 04/06/24 Time HH Agency Contacted: 1330 Representative spoke with at [facility] Agency: [place]   Social Drivers of Health (SDOH) Interventions SDOH Screenings   Food Insecurity: No Food Insecurity (03/30/2024)  Housing: Low Risk  (03/30/2024)  Transportation Needs: No Transportation Needs (03/30/2024)  Utilities: Not At Risk (03/30/2024)  Social Connections: Moderately  Integrated (03/30/2024)  Tobacco Use: Medium Risk (03/29/2024)    Readmission Risk Interventions     No data to display

## 2024-04-06 NOTE — Evaluation (Signed)
 RT Evaluate and Treat Note  04/06/2024   Breathing is (select one): Same as normal    The following was found on auscultation (select multiple):  Bilateral Breath Sounds: Diminished (04/06/24 1208)  R Upper  Breath Sounds: Diminished;Rhonchi (04/05/24 0741) L Upper Breath Sounds: Diminished (04/05/24 1935) R Lower Breath Sounds: Diminished;Rhonchi (04/05/24 0741) L Lower Breath Sounds: Diminished (04/05/24 1935)    Cough Assessment: Cough: Productive;Congested;Strong (04/06/24 1208)    Most Recent Chest Xray:... (No results found.    The following medications and/or interventions were ordered/changed/discontinued as part of the Respiratory Treatment protocol:   Medication Changes: no changes   Airway Clearance Changes: no changes   Oxygen  Therapy Changes: no changes      No changes made.

## 2024-04-06 NOTE — Plan of Care (Signed)
   Problem: Skin Integrity: Goal: Risk for impaired skin integrity will decrease Outcome: Progressing

## 2024-04-06 NOTE — Plan of Care (Signed)

## 2024-04-06 NOTE — Progress Notes (Signed)
 PROGRESS NOTE    Mitchell King  FMW:[id number] DOB: Jun 27, 1950 DOA: 03/29/2024 PCP: No primary care provider on file.   Chief Complaint  Patient presents with   Shortness of Breath    Brief Narrative:   73 year old male known to have chronic respiratory failure secondary to COPD-prior tobacco-currently follows with Dr. 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 previously admitted with hyperammonemia 07/2020   Came in from home 10/9 with 2 weeks of worsening chills rigors diminished breath sounds given Solu-Medrol  in ED requiring more than 2 L of his baseline to maintain sats in the 90s-requiring 5 L and was placed on BiPAP ultimately weaned off of it in the ED Sodium 136 potassium 3.2 BUN/creatinine 7/1.1 GFR 60 BNP 1440 Lactic acid 2.2 cycling to 1.8 WBC 2.2 hemoglobin 13.7 platelet 109 differential normal Legionella pending expectorated sputum pending echo pending   COVID-positive but CRP 0.9-given a dose of remdesivir Decadron ceftriaxone and maintained only on 10/12 x-ray repeated shows emphysema and no infiltrates   Assessment & Plan:   Principal Problem:   Acute hypoxic respiratory failure (HCC) Active Problems:   COPD with acute exacerbation (HCC)   COVID-19 virus infection   Acute exacerbation of CHF (congestive heart failure) (HCC)   Essential hypertension   Non-insulin  dependent type 2 diabetes mellitus (HCC)  Acute on chronic respiratory failure with hypoxia COVID-19 infection  COPD -He is on 2 L oxygen  at home, to 6 L oxygen , this has been improving this morning down to 3 L . - Encouraged use incentive spirometer and flutter valve again - When hypoxia he is treated with Decadron - He was encouraged use incentive spirometry and flutter valve . - Followed by Dr. Kara in the  outpatient setting and chronically on 2 -Continues Flonase 2 spray daily scheduled, Mucinex  600 twice daily,   DuoNeb-add Breo Ellipta - Steroids as above for now   NSVT  - Nonsustained continues to have some PVCs - started on  low-dose Toprol -XL 25 daily -2D echo with a preserved EF 60 to 65%  Acute on chronic HFpEF last echo 60-65% with cor pulmonale - echo shows EF similar with slightly flattened interventricular septum and moderately elevated PASP ,Right-sided elevated PASP -On diuresis, dose daily as needed, will need strict ins and out -Significant drop in weight 211 on admission to 192, continue to diurese as needed - Continue with diuresis as needed, received few doses of IV Lasix . - Resume home torsemide   Previous acute PEs in 2022 - Appears to have completed treatment-no role for further anticoagulation currently  Possible OSA at baseline - Requires outpatient workup  Hospital delirium -patient is significantly confused and altered this morning, most likely in setting of hospital delirium, discussed with staff, will try to get out of bed to chair, ambulate, open the curtains   Chronic liver disease - This has been reported in the past and needs to be followed up with periodic LFTs when he is better  Impaired glucose tolerance with A1c of 5.6 and variable between 5 and 6 range -Sugars predominantly are below 180 -Needs CBG coverage at this time no sliding scale is required-as an outpatient may benefit from Farxiga  if can afford  Moderate hypokalemia -resolved  Hypokalemia  -replaced    DVT prophylaxis: (Lovenox ) Code Status: (  Full) Family Communication: (none at bedside) Disposition:   Status is: Inpatient    Consultants:  none   Subjective:  Confused and has improved, denies any complaints today Objective: Vitals:   04/06/24 0100 04/06/24 0447 04/06/24 0906 04/06/24 1117  BP:  128/87 137/78 (!) 127/115  Pulse:  82 90 76  Resp: 19 18 20  (!) 28   Temp:  98 F (36.7 C) 97.8 F (36.6 C)   TempSrc:  Oral Oral   SpO2:  92% 91% 92%  Weight:  86.1 kg      Intake/Output Summary (Last 24 hours) at 04/06/2024 1133 Last data filed at 04/06/2024 0129 Gross per 24 hour  Intake 360 ml  Output 1650 ml  Net -1290 ml   Filed Weights   04/04/24 0500 04/05/24 0500 04/06/24 0447  Weight: 87.4 kg 85.8 kg 86.1 kg    Examination:  Awake Alert, Oriented X 2, mentation improved, more coherent today Symmetrical Chest wall movement, diminished at the bases with some rales RRR,No Gallops,Rubs or new Murmurs, No Parasternal Heave +ve B.Sounds, Abd Soft, No tenderness, No rebound - guarding or rigidity. No Cyanosis, Clubbing or edema, No new Rash or bruise        Data Reviewed: I have personally reviewed following labs and imaging studies  CBC: Recent Labs  Lab 04/02/24 0332 04/03/24 0447 04/04/24 0351 04/05/24 0401 04/06/24 0837  WBC 3.6* 4.0 3.5* 4.6 5.2  NEUTROABS 2.8 2.9 2.7 3.5 3.5  HGB 14.2 14.6 15.3 15.7 16.7  HCT 46.1 48.5 50.3 50.7 54.1*  MCV 99.6 100.0 98.8 97.1 96.6  PLT 131* 145* 148* 148* 162    Basic Metabolic Panel: Recent Labs  Lab 04/01/24 0537 04/02/24 0332 04/03/24 0447 04/04/24 0351 04/05/24 0401 04/06/24 0837  NA 135 134* 134* 133* 134* 137  K 3.2* 3.9 5.0 4.7 4.4 4.1  CL 83* 88* 92* 93* 91* 92*  CO2 40* 35* 32 31 34* 33*  GLUCOSE 118* 124* 133* 124* 109* 132*  BUN 18 19 36* 41* 46* 47*  CREATININE 1.05 1.33* 1.49* 1.12 1.21 1.25*  CALCIUM 8.8* 9.1 9.1 9.6 9.2 9.6  MG 1.7  --   --   --   --  2.3  PHOS 3.4  --   --   --   --  2.9    GFR: CrCl cannot be calculated (Unknown ideal weight.).  Liver Function Tests: Recent Labs  Lab 03/31/24 1125  AST 34  ALT 11  ALKPHOS 44  BILITOT 2.0*  PROT 7.4  ALBUMIN 3.7    CBG: Recent Labs  Lab 03/30/24 1933 03/30/24 2123 03/31/24 0805 03/31/24 1144 03/31/24 1553  GLUCAP 148* 113* 100* 125* 117*     Recent Results (from the past 240  hours)  Resp panel by RT-PCR (RSV, Flu A&B, Covid) Anterior Nasal Swab     Status: Abnormal   Collection Time: 03/29/24  6:12 PM   Specimen: Anterior Nasal Swab  Result Value Ref Range Status   SARS Coronavirus 2 by RT PCR POSITIVE (A) NEGATIVE Final   Influenza A by PCR NEGATIVE NEGATIVE Final   Influenza B by PCR NEGATIVE NEGATIVE Final    Comment: (NOTE) The Xpert Xpress SARS-CoV-2/FLU/RSV plus assay is intended as an aid in the diagnosis of influenza from Nasopharyngeal swab specimens and should not be used as a sole basis for treatment. Nasal washings and aspirates are unacceptable for Xpert Xpress SARS-CoV-2/FLU/RSV testing.  Fact Sheet for Patients: BloggerCourse.com  Fact Sheet for Healthcare Providers:  SeriousBroker.it  This test is not yet approved or cleared by the [place]  FDA and has been authorized for detection and/or diagnosis of SARS-CoV-2 by FDA under an Emergency Use Authorization (EUA). This EUA will remain in effect (meaning this test can be used) for the duration of the COVID-19 declaration under Section 564(b)(1) of the Act, 21 U.S.C. section 360bbb-3(b)(1), unless the authorization is terminated or revoked.     Resp Syncytial Virus by PCR NEGATIVE NEGATIVE Final    Comment: (NOTE) Fact Sheet for Patients: BloggerCourse.com  Fact Sheet for Healthcare Providers: SeriousBroker.it  This test is not yet approved or cleared by the [place]  FDA and has been authorized for detection and/or diagnosis of SARS-CoV-2 by FDA under an Emergency Use Authorization (EUA). This EUA will remain in effect (meaning this test can be used) for the duration of the COVID-19 declaration under Section 564(b)(1) of the Act, 21 U.S.C. section 360bbb-3(b)(1), unless the authorization is terminated or revoked.  Performed at [facility], [address]  [place], [place] [address]   Blood Culture (routine x 2)     Status: None   Collection Time: 03/29/24  7:02 PM   Specimen: BLOOD RIGHT ARM  Result Value Ref Range Status   Specimen Description BLOOD RIGHT ARM  Final   Special Requests   Final    BOTTLES DRAWN AEROBIC AND ANAEROBIC Blood Culture adequate volume   Culture   Final    NO GROWTH 5 DAYS Performed at [facility], [address], [place], [place] [address]    Report Status 04/03/2024 FINAL  Final  Blood Culture (routine x 2)     Status: None   Collection Time: 03/29/24  7:02 PM   Specimen: BLOOD LEFT ARM  Result Value Ref Range Status   Specimen Description BLOOD LEFT ARM  Final   Special Requests   Final    BOTTLES DRAWN AEROBIC AND ANAEROBIC Blood Culture adequate volume   Culture   Final    NO GROWTH 5 DAYS Performed at [facility], [address], [place], [place] [address]    Report Status 04/03/2024 FINAL  Final         Radiology Studies: No results found.       Scheduled Meds:  arformoterol   15 mcg Nebulization BID   budesonide  (PULMICORT ) nebulizer solution  0.25 mg Nebulization BID   dexamethasone  4 mg Oral Daily   enoxaparin  (LOVENOX ) injection  40 mg Subcutaneous Q24H   fluticasone   2 spray Each Nare Daily   guaiFENesin   600 mg Oral BID   metoprolol  succinate  25 mg Oral Daily   pantoprazole   40 mg Oral Q1200   sodium chloride  flush  3 mL Intravenous Q12H   sodium chloride  flush  3 mL Intravenous Q12H   Continuous Infusions:   LOS: 8 days      Brayton Lye, MD [facility]   To contact the attending provider between 7A-7P or the covering provider during after hours 7P-7A, please log into the web site [email or web address] and access using universal  password for that web site. If you do not have the password, please call the hospital operator.  04/06/2024, 11:33 AM

## 2024-04-07 DIAGNOSIS — J9601 Acute respiratory failure with hypoxia: Secondary | ICD-10-CM | POA: Diagnosis not present

## 2024-04-07 LAB — BASIC METABOLIC PANEL WITH GFR
Anion gap: 14 (ref 5–15)
BUN: 51 mg/dL — ABNORMAL HIGH (ref 8–23)
CO2: 36 mmol/L — ABNORMAL HIGH (ref 22–32)
Calcium: 10 mg/dL (ref 8.9–10.3)
Chloride: 88 mmol/L — ABNORMAL LOW (ref 98–111)
Creatinine, Ser: 1.38 mg/dL — ABNORMAL HIGH (ref 0.61–1.24)
GFR, Estimated: 54 mL/min — ABNORMAL LOW (ref >60)
Glucose, Bld: 141 mg/dL — ABNORMAL HIGH (ref 70–99)
Potassium: 3.8 mmol/L (ref 3.5–5.1)
Sodium: 138 mmol/L (ref 135–145)

## 2024-04-07 LAB — CBC WITH DIFFERENTIAL/PLATELET
Abs Immature Granulocytes: 0.03 K/uL (ref 0.00–0.07)
Basophils Absolute: 0 K/uL (ref 0.0–0.1)
Basophils Relative: 0 %
Eosinophils Absolute: 0 K/uL (ref 0.0–0.5)
Eosinophils Relative: 0 %
HCT: 56.5 % — ABNORMAL HIGH (ref 39.0–52.0)
Hemoglobin: 17.9 g/dL — ABNORMAL HIGH (ref 13.0–17.0)
Immature Granulocytes: 1 %
Lymphocytes Relative: 10 %
Lymphs Abs: 0.4 K/uL — ABNORMAL LOW (ref 0.7–4.0)
MCH: 30.5 pg (ref 26.0–34.0)
MCHC: 31.7 g/dL (ref 30.0–36.0)
MCV: 96.3 fL (ref 80.0–100.0)
Monocytes Absolute: 0.8 K/uL (ref 0.1–1.0)
Monocytes Relative: 20 %
Neutro Abs: 2.7 K/uL (ref 1.7–7.7)
Neutrophils Relative %: 69 %
Platelets: 165 K/uL (ref 150–400)
RBC: 5.87 MIL/uL — ABNORMAL HIGH (ref 4.22–5.81)
RDW: 13.5 % (ref 11.5–15.5)
WBC: 4 K/uL (ref 4.0–10.5)
nRBC: 0 % (ref 0.0–0.2)

## 2024-04-07 LAB — MAGNESIUM: Magnesium: 2.4 mg/dL (ref 1.7–2.4)

## 2024-04-07 LAB — PHOSPHORUS: Phosphorus: 4.9 mg/dL — ABNORMAL HIGH (ref 2.5–4.6)

## 2024-04-07 MED ORDER — QUETIAPINE FUMARATE 25 MG PO TABS
25.0000 mg | ORAL_TABLET | ORAL | Status: DC
  Administered 2024-04-07 – 2024-04-08 (×2): 25 mg via ORAL
  Filled 2024-04-07 (×2): qty 1

## 2024-04-07 MED ORDER — HALOPERIDOL LACTATE 5 MG/ML IJ SOLN
1.0000 mg | INTRAMUSCULAR | Status: AC
  Administered 2024-04-07: 1 mg via INTRAVENOUS
  Filled 2024-04-07: qty 1

## 2024-04-07 NOTE — Plan of Care (Signed)
  Problem: Education: Goal: Ability to describe self-care measures that may prevent or decrease complications (Diabetes Survival Skills Education) will improve Outcome: Progressing   Problem: Skin Integrity: Goal: Risk for impaired skin integrity will decrease Outcome: Progressing   Problem: Safety: Goal: Ability to remain free from injury will improve Outcome: Progressing   Problem: Skin Integrity: Goal: Risk for impaired skin integrity will decrease Outcome: Progressing   Problem: Respiratory: Goal: Ability to maintain a clear airway will improve Outcome: Progressing

## 2024-04-07 NOTE — Progress Notes (Signed)
 Upon initial assessment, patient observed in bed soaked in urine, condom catheter lying on floor. Disheveled in bed. PIV in left arm dislodged.   Patient given full bed bath and full linen change. Condom catheter replaced. Bilateral hand mitts applied as patient is familiar to me and was removing oxygen , cardiac leads, etc. last night. Bed alarm set.   At this time, patient calm and cooperative.

## 2024-04-07 NOTE — Progress Notes (Signed)
 PROGRESS NOTE    Mitchell King  FMW:[id number] DOB: 1951/03/08 DOA: 03/29/2024 PCP: No primary care provider on file.   Chief Complaint  Patient presents with   Shortness of Breath    Brief Narrative:   73 year old male known to have chronic respiratory failure secondary to COPD-prior tobacco-currently follows with Dr. 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 previously admitted with hyperammonemia 07/2020   Came in from home 10/9 with 2 weeks of worsening chills rigors diminished breath sounds given Solu-Medrol  in ED requiring more than 2 L of his baseline to maintain sats in the 90s-requiring 5 L and was placed on BiPAP ultimately weaned off of it in the ED Sodium 136 potassium 3.2 BUN/creatinine 7/1.1 GFR 60 BNP 1440 Lactic acid 2.2 cycling to 1.8 WBC 2.2 hemoglobin 13.7 platelet 109 differential normal Legionella pending expectorated sputum pending echo pending   COVID-positive but CRP 0.9-given a dose of remdesivir Decadron ceftriaxone and maintained only on 10/12 x-ray repeated shows emphysema and no infiltrates   Assessment & Plan:   Principal Problem:   Acute hypoxic respiratory failure (HCC) Active Problems:   COPD with acute exacerbation (HCC)   COVID-19 virus infection   Acute exacerbation of CHF (congestive heart failure) (HCC)   Essential hypertension   Non-insulin  dependent type 2 diabetes mellitus (HCC)  Acute on chronic respiratory failure with hypoxia COVID-19 infection  COPD -He is on 2 L oxygen  at home, to 6 L oxygen , this has been improving, currently on 2 to 3 L nasal cannula . .- Encouraged use incentive spirometer and flutter valve again - He was treated with Decadron given his hypoxia, last day of Decadron today - He was encouraged use incentive spirometry and  flutter valve . - Followed by Dr. Kara in the outpatient setting and chronically on 2 -Continues Flonase 2 spray daily scheduled, Mucinex  600 twice daily,   DuoNeb-add Breo Ellipta - Steroids as above for now   NSVT  - Nonsustained continues to have some PVCs - started on  low-dose Toprol -XL 25 daily -2D echo with a preserved EF 60 to 65%  Acute on chronic HFpEF last echo 60-65% with cor pulmonale - echo shows EF similar with slightly flattened interventricular septum and moderately elevated PASP ,Right-sided elevated PASP -On diuresis, dose daily as needed, will need strict ins and out -Significant drop in weight 211 on admission to 192, continue to diurese as needed - Continue with diuresis as needed, received few doses of IV Lasix . - Resume home torsemide   Previous acute PEs in 2022 - Appears to have completed treatment-no role for further anticoagulation currently  Possible OSA at baseline - Requires outpatient workup  Hospital delirium -patient is significantly confused and altered this morning, most likely in setting of hospital delirium, discussed with staff, will try to get out of bed to chair, ambulate, open the curtains   Chronic liver disease - This has been reported in the past and needs to be followed up with periodic LFTs when he is better  Impaired glucose tolerance with A1c of 5.6 and variable between 5 and 6 range -Sugars predominantly are below 180 -Needs CBG coverage at this time no sliding scale is required-as an outpatient may benefit from Farxiga  if can afford  Moderate hypokalemia -resolved  Hypokalemia  -replaced  DVT prophylaxis: (Lovenox ) Code Status: (Full) Family Communication: (none at bedside) Disposition: Plan for SNF placement, tomorrow's last day of COVID isolation.  So should be good for Monday.  Status is: Inpatient    Consultants:  none   Subjective:  Confused, somnolent, have encouraged him to use incentive spirometry  and flutter valve Objective: Vitals:   04/07/24 0427 04/07/24 0800 04/07/24 0845 04/07/24 1200  BP:  116/69  113/85  Pulse: 81 69  86  Resp: 20 14 18  (!) 22  Temp:  98.4 F (36.9 C)    TempSrc:  Oral    SpO2: 100% 93%  91%  Weight:        Intake/Output Summary (Last 24 hours) at 04/07/2024 1413 Last data filed at 04/06/2024 2208 Gross per 24 hour  Intake --  Output 900 ml  Net -900 ml   Filed Weights   04/04/24 0500 04/05/24 0500 04/06/24 0447  Weight: 87.4 kg 85.8 kg 86.1 kg    Examination:  Somnolent, no apparent distress Diminished air entry at the bases Regular rate and rhythm Abdomen soft Extremities with no edema      Data Reviewed: I have personally reviewed following labs and imaging studies  CBC: Recent Labs  Lab 04/03/24 0447 04/04/24 0351 04/05/24 0401 04/06/24 0837 04/07/24 0722  WBC 4.0 3.5* 4.6 5.2 4.0  NEUTROABS 2.9 2.7 3.5 3.5 2.7  HGB 14.6 15.3 15.7 16.7 17.9*  HCT 48.5 50.3 50.7 54.1* 56.5*  MCV 100.0 98.8 97.1 96.6 96.3  PLT 145* 148* 148* 162 165    Basic Metabolic Panel: Recent Labs  Lab 04/01/24 0537 04/02/24 0332 04/03/24 0447 04/04/24 0351 04/05/24 0401 04/06/24 0837 04/07/24 0722  NA 135   < > 134* 133* 134* 137 138  K 3.2*   < > 5.0 4.7 4.4 4.1 3.8  CL 83*   < > 92* 93* 91* 92* 88*  CO2 40*   < > 32 31 34* 33* 36*  GLUCOSE 118*   < > 133* 124* 109* 132* 141*  BUN 18   < > 36* 41* 46* 47* 51*  CREATININE 1.05   < > 1.49* 1.12 1.21 1.25* 1.38*  CALCIUM 8.8*   < > 9.1 9.6 9.2 9.6 10.0  MG 1.7  --   --   --   --  2.3 2.4  PHOS 3.4  --   --   --   --  2.9 4.9*   < > = values in this interval not displayed.    GFR: CrCl cannot be calculated (Unknown ideal weight.).  Liver Function Tests: No results for input(s): AST, ALT, ALKPHOS, BILITOT, PROT, ALBUMIN in the last 168 hours.   CBG: Recent Labs  Lab 03/31/24 1553  GLUCAP 117*     Recent Results (from the past 240 hours)  Resp panel by RT-PCR  (RSV, Flu A&B, Covid) Anterior Nasal Swab     Status: Abnormal   Collection Time: 03/29/24  6:12 PM   Specimen: Anterior Nasal Swab  Result Value Ref Range Status   SARS Coronavirus 2 by RT PCR POSITIVE (A) NEGATIVE Final   Influenza A by PCR NEGATIVE NEGATIVE Final   Influenza B by PCR NEGATIVE NEGATIVE Final    Comment: (NOTE) The Xpert Xpress SARS-CoV-2/FLU/RSV plus assay is intended as an aid in the diagnosis of influenza from Nasopharyngeal swab specimens and should not be used as a sole basis for treatment. Nasal washings and aspirates are unacceptable for Xpert Xpress SARS-CoV-2/FLU/RSV testing.  Fact Sheet for Patients: BloggerCourse.com  Fact Sheet for Healthcare Providers: SeriousBroker.it  This test is not yet approved or cleared by the [place]  FDA and has been authorized for detection and/or diagnosis of SARS-CoV-2 by FDA under an Emergency Use Authorization (EUA). This EUA will remain in effect (meaning this test can be used) for the duration of the COVID-19 declaration under Section 564(b)(1) of the Act, 21 U.S.C. section 360bbb-3(b)(1), unless the authorization is terminated or revoked.     Resp Syncytial Virus by PCR NEGATIVE NEGATIVE Final    Comment: (NOTE) Fact Sheet for Patients: BloggerCourse.com  Fact Sheet for Healthcare Providers: SeriousBroker.it  This test is not yet approved or cleared by the [place]  FDA and has been authorized for detection and/or diagnosis of SARS-CoV-2 by FDA under an Emergency Use Authorization (EUA). This EUA will remain in effect (meaning this test can be used) for the duration of the COVID-19 declaration under Section 564(b)(1) of the Act, 21 U.S.C. section 360bbb-3(b)(1), unless the authorization is terminated or revoked.  Performed at [facility], [address] [place], [place] [address]   Blood  Culture (routine x 2)     Status: None   Collection Time: 03/29/24  7:02 PM   Specimen: BLOOD RIGHT ARM  Result Value Ref Range Status   Specimen Description BLOOD RIGHT ARM  Final   Special Requests   Final    BOTTLES DRAWN AEROBIC AND ANAEROBIC Blood Culture adequate volume   Culture   Final    NO GROWTH 5 DAYS Performed at [facility], [address], [place], [place] [address]    Report Status 04/03/2024 FINAL  Final  Blood Culture (routine x 2)     Status: None   Collection Time: 03/29/24  7:02 PM   Specimen: BLOOD LEFT ARM  Result Value Ref Range Status   Specimen Description BLOOD LEFT ARM  Final   Special Requests   Final    BOTTLES DRAWN AEROBIC AND ANAEROBIC Blood Culture adequate volume   Culture   Final    NO GROWTH 5 DAYS Performed at [facility], [address], [place], [place] [address]    Report Status 04/03/2024 FINAL  Final         Radiology Studies: No results found.       Scheduled Meds:  arformoterol   15 mcg Nebulization BID   And   umeclidinium bromide   1 puff Inhalation Daily   budesonide  (PULMICORT ) nebulizer solution  0.25 mg Nebulization BID   empagliflozin  25 mg Oral Daily   enoxaparin  (LOVENOX ) injection  40 mg Subcutaneous Q24H   fluticasone   2 spray Each Nare Daily   guaiFENesin   600 mg Oral BID   lactulose   30 g Oral BID   metoprolol  succinate  25 mg Oral Daily   pantoprazole   40 mg Oral Q1200   QUEtiapine  25 mg Oral Q24H   sodium chloride  flush  3 mL Intravenous Q12H   tamsulosin   0.4 mg Oral Daily   torsemide   20 mg Oral Daily   Continuous Infusions:   LOS: 9 days      Brayton Lye, MD [facility]   To contact the attending provider between 7A-7P or the covering provider during after hours 7P-7A, please log into the web site [email or web address] and access using universal [place] password for that web site. If you do not have the password, please call the hospital operator.  04/07/2024, 2:13  PM

## 2024-04-07 NOTE — Progress Notes (Signed)
 Patient observed attempting to exit bed again, oxygen  off, desaturating. Provider on call C. Hall DO notified and Haldol 1 mg x 1 ordered for agitation.

## 2024-04-07 NOTE — Significant Event (Signed)
 Responded to bed alarm.  Patient sitting up in bed with legs over side rail. Patient stated he is going home. Reoriented patient to environment, time, situation and asked him to return to bed for his safety at this hour. Patient reluctant. Patient also giggling and talking nonsense. 3 staff members required to coach patient back to bed.  Oxygen , cardiac leads, and SPO2 monitoring reapplied, bed alarm reactivated, patient settled back down.

## 2024-04-08 DIAGNOSIS — J9601 Acute respiratory failure with hypoxia: Secondary | ICD-10-CM | POA: Diagnosis not present

## 2024-04-08 LAB — CBC
HCT: 52.1 % — ABNORMAL HIGH (ref 39.0–52.0)
Hemoglobin: 16.6 g/dL (ref 13.0–17.0)
MCH: 30.6 pg (ref 26.0–34.0)
MCHC: 31.9 g/dL (ref 30.0–36.0)
MCV: 96.1 fL (ref 80.0–100.0)
Platelets: 191 K/uL (ref 150–400)
RBC: 5.42 MIL/uL (ref 4.22–5.81)
RDW: 13.5 % (ref 11.5–15.5)
WBC: 4.8 K/uL (ref 4.0–10.5)
nRBC: 0 % (ref 0.0–0.2)

## 2024-04-08 LAB — PHOSPHORUS: Phosphorus: 5.7 mg/dL — ABNORMAL HIGH (ref 2.5–4.6)

## 2024-04-08 LAB — BASIC METABOLIC PANEL WITH GFR
Anion gap: 13 (ref 5–15)
BUN: 60 mg/dL — ABNORMAL HIGH (ref 8–23)
CO2: 35 mmol/L — ABNORMAL HIGH (ref 22–32)
Calcium: 9.5 mg/dL (ref 8.9–10.3)
Chloride: 89 mmol/L — ABNORMAL LOW (ref 98–111)
Creatinine, Ser: 1.33 mg/dL — ABNORMAL HIGH (ref 0.61–1.24)
GFR, Estimated: 56 mL/min — ABNORMAL LOW (ref >60)
Glucose, Bld: 121 mg/dL — ABNORMAL HIGH (ref 70–99)
Potassium: 4 mmol/L (ref 3.5–5.1)
Sodium: 137 mmol/L (ref 135–145)

## 2024-04-08 LAB — MAGNESIUM: Magnesium: 2.4 mg/dL (ref 1.7–2.4)

## 2024-04-08 MED ORDER — HYDROMORPHONE HCL 1 MG/ML IJ SOLN: 0.5000 mg | INTRAMUSCULAR | Status: DC

## 2024-04-08 MED ORDER — REVEFENACIN 175 MCG/3ML IN SOLN
175.0000 ug | Freq: Every day | RESPIRATORY_TRACT | Status: DC
  Administered 2024-04-09 – 2024-04-10 (×2): 175 ug via RESPIRATORY_TRACT
  Filled 2024-04-08 (×2): qty 3

## 2024-04-08 MED ORDER — METOPROLOL SUCCINATE ER 25 MG PO TB24
12.5000 mg | ORAL_TABLET | Freq: Every day | ORAL | Status: DC
  Administered 2024-04-08 – 2024-04-10 (×3): 12.5 mg via ORAL
  Filled 2024-04-08 (×3): qty 1

## 2024-04-08 MED ORDER — OXYCODONE HCL 5 MG PO TABS
5.0000 mg | ORAL_TABLET | ORAL | Status: AC
  Administered 2024-04-08: 5 mg via ORAL
  Filled 2024-04-08: qty 1

## 2024-04-08 MED ORDER — ENSURE PLUS HIGH PROTEIN PO LIQD
237.0000 mL | Freq: Two times a day (BID) | ORAL | Status: DC
  Administered 2024-04-08 (×2): 237 mL via ORAL

## 2024-04-08 MED ORDER — TORSEMIDE 20 MG PO TABS: 20.0000 mg | ORAL_TABLET | Freq: Every day | ORAL | Status: DC

## 2024-04-08 NOTE — Plan of Care (Signed)
  Problem: Education: Goal: Ability to describe self-care measures that may prevent or decrease complications (Diabetes Survival Skills Education) will improve 04/08/2024 0503 by Jensen Cheramie, RN Outcome: Progressing 04/08/2024 0503 by Jediah Horger, RN Outcome: Progressing Goal: Individualized Educational Video(s) Outcome: Progressing   Problem: Coping: Goal: Ability to adjust to condition or change in health will improve Outcome: Progressing   Problem: Fluid Volume: Goal: Ability to maintain a balanced intake and output will improve Outcome: Progressing   Problem: Health Behavior/Discharge Planning: Goal: Ability to identify and utilize available resources and services will improve Outcome: Progressing Goal: Ability to manage health-related needs will improve Outcome: Progressing   Problem: Metabolic: Goal: Ability to maintain appropriate glucose levels will improve Outcome: Progressing   Problem: Nutritional: Goal: Maintenance of adequate nutrition will improve Outcome: Progressing Goal: Progress toward achieving an optimal weight will improve Outcome: Progressing   Problem: Skin Integrity: Goal: Risk for impaired skin integrity will decrease Outcome: Progressing   Problem: Tissue Perfusion: Goal: Adequacy of tissue perfusion will improve Outcome: Progressing   Problem: Education: Goal: Knowledge of General Education information will improve Description: Including pain rating scale, medication(s)/side effects and non-pharmacologic comfort measures Outcome: Progressing   Problem: Health Behavior/Discharge Planning: Goal: Ability to manage health-related needs will improve Outcome: Progressing   Problem: Clinical Measurements: Goal: Ability to maintain clinical measurements within normal limits will improve Outcome: Progressing Goal: Will remain free from infection Outcome: Progressing Goal: Diagnostic test results will improve Outcome: Progressing Goal:  Respiratory complications will improve Outcome: Progressing Goal: Cardiovascular complication will be avoided Outcome: Progressing   Problem: Activity: Goal: Risk for activity intolerance will decrease Outcome: Progressing   Problem: Nutrition: Goal: Adequate nutrition will be maintained Outcome: Progressing   Problem: Coping: Goal: Level of anxiety will decrease Outcome: Progressing   Problem: Elimination: Goal: Will not experience complications related to bowel motility Outcome: Progressing Goal: Will not experience complications related to urinary retention Outcome: Progressing   Problem: Pain Managment: Goal: General experience of comfort will improve and/or be controlled Outcome: Progressing   Problem: Safety: Goal: Ability to remain free from injury will improve Outcome: Progressing   Problem: Skin Integrity: Goal: Risk for impaired skin integrity will decrease Outcome: Progressing   Problem: Education: Goal: Knowledge of disease or condition will improve Outcome: Progressing Goal: Knowledge of the prescribed therapeutic regimen will improve Outcome: Progressing Goal: Individualized Educational Video(s) Outcome: Progressing   Problem: Activity: Goal: Ability to tolerate increased activity will improve Outcome: Progressing Goal: Will verbalize the importance of balancing activity with adequate rest periods Outcome: Progressing   Problem: Respiratory: Goal: Ability to maintain a clear airway will improve Outcome: Progressing Goal: Levels of oxygenation will improve Outcome: Progressing Goal: Ability to maintain adequate ventilation will improve Outcome: Progressing

## 2024-04-08 NOTE — Plan of Care (Signed)

## 2024-04-08 NOTE — Evaluation (Signed)
 RT Evaluate and Treat Note  04/08/2024   Breathing is (select one): Same as normal    The following was found on auscultation (select multiple):  Bilateral Breath Sounds: Diminished (04/08/24 0800)  R Upper  Breath Sounds: Diminished (04/08/24 0800) L Upper Breath Sounds: Diminished (04/08/24 0800) R Lower Breath Sounds: Diminished (04/08/24 0800) L Lower Breath Sounds: Diminished (04/08/24 0800)    Cough Assessment: Cough: Congested (04/08/24 0400)    Most Recent Chest Xray:... (No results found.    The following medications and/or interventions were ordered/changed/discontinued as part of the Respiratory Treatment protocol:   Medication Changes: IncruseChanged to Yupelri    Airway Clearance Changes: Cough & Deep Breath  Ordered   Oxygen  Therapy Changes:Cannula Ordered

## 2024-04-08 NOTE — Progress Notes (Signed)
 PROGRESS NOTE    Mitchell King  FMW:[id number] DOB: [birth year] DOA: 03/29/2024 PCP: No primary care provider on file.   Chief Complaint  Patient presents with   Shortness of Breath    Brief Narrative:   73 year old male known to have chronic respiratory failure secondary to COPD-prior tobacco-currently follows with Dr. 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 previously admitted with hyperammonemia 07/2020   Came in from home 10/9 with 2 weeks of worsening chills rigors diminished breath sounds given Solu-Medrol  in ED requiring more than 2 L of his baseline to maintain sats in the 90s-requiring 5 L and was placed on BiPAP ultimately weaned off of it in the ED Sodium 136 potassium 3.2 BUN/creatinine 7/1.1 GFR 60 BNP 1440 Lactic acid 2.2 cycling to 1.8 WBC 2.2 hemoglobin 13.7 platelet 109 differential normal Legionella pending expectorated sputum pending echo pending   COVID-positive but CRP 0.9-given a dose of remdesivir Decadron ceftriaxone and maintained only on 10/12 x-ray repeated shows emphysema and no infiltrates   Assessment & Plan:   Principal Problem:   Acute hypoxic respiratory failure (HCC) Active Problems:   COPD with acute exacerbation (HCC)   COVID-19 virus infection   Acute exacerbation of CHF (congestive heart failure) (HCC)   Essential hypertension   Non-insulin  dependent type 2 diabetes mellitus (HCC)  Acute on chronic respiratory failure with hypoxia COVID-19 infection  COPD -He is on 2 L oxygen  at home, to 6 L oxygen , this has been improving, currently on 2 to 3 L nasal cannula . .- Encouraged use incentive spirometer and flutter valve again - He was treated with Decadron given his hypoxia, last day of Decadron today - He was encouraged use incentive spirometry and  flutter valve . - Followed by Dr. Kara in the outpatient setting and chronically on 2 -Continues Flonase 2 spray daily scheduled, Mucinex  600 twice daily,   DuoNeb-add Breo Ellipta - Steroids as above for now   NSVT  - Nonsustained continues to have some PVCs - started on  low-dose Toprol -XL 25 daily -2D echo with a preserved EF 60 to 65%  Acute on chronic HFpEF last echo 60-65% with cor pulmonale Hypertension - echo shows EF similar with slightly flattened interventricular septum and moderately elevated PASP ,Right-sided elevated PASP -On diuresis, dose daily as needed, will need strict ins and out - On IV diuresis initially, was dosed as needed, he is back on his home dose torsemide  -Due to frailty and deconditioning I will hold Jardiance for now - Blood pressure has been soft so I will decrease his Toprol -XL  Previous acute PEs in 2022 - Appears to have completed treatment-no role for further anticoagulation currently  Possible OSA at baseline - Requires outpatient workup  Hospital delirium -patient is significantly confused and altered this morning, most likely in setting of hospital delirium, discussed with staff, will try to get out of bed to chair, ambulate, open the curtains   Chronic liver disease - This has been reported in the past and needs to be followed up with periodic LFTs when he is better  Impaired glucose tolerance with A1c of 5.6 and variable between 5 and 6 range -Sugars predominantly are below 140   Moderate hypokalemia -resolved  Hypokalemia  -replaced    DVT prophylaxis: (Lovenox ) Code Status: (  Full) Family Communication: (none at bedside, discussed with son by phone) Disposition: Plan for SNF placement, today's last day of COVID isolation isolation.  So should be good for Monday.  Status is: Inpatient    Consultants:  none   Subjective:  No significant events overnight as discussed with staff, appetite has improved, but he needs  encouragement Objective: Vitals:   04/08/24 0417 04/08/24 0500 04/08/24 0648 04/08/24 0833  BP:    (!) 138/98  Pulse: 78 70 72   Resp: 17 13 14 14   Temp:    (!) 96.7 F (35.9 C)  TempSrc:    Axillary  SpO2: 95% 98% 98% 94%  Weight: 81.6 kg       Intake/Output Summary (Last 24 hours) at 04/08/2024 1113 Last data filed at 04/08/2024 0418 Gross per 24 hour  Intake --  Output 2800 ml  Net -2800 ml   Filed Weights   04/05/24 0500 04/06/24 0447 04/08/24 0417  Weight: 85.8 kg 86.1 kg 81.6 kg    Examination:  Awake, mildly confused, no apparent distress Good air entry bilaterally Regular rate and rhythm Abdomen soft Extremities with no edema     Data Reviewed: I have personally reviewed following labs and imaging studies  CBC: Recent Labs  Lab 04/03/24 0447 04/04/24 0351 04/05/24 0401 04/06/24 0837 04/07/24 0722 04/08/24 0445  WBC 4.0 3.5* 4.6 5.2 4.0 4.8  NEUTROABS 2.9 2.7 3.5 3.5 2.7  --   HGB 14.6 15.3 15.7 16.7 17.9* 16.6  HCT 48.5 50.3 50.7 54.1* 56.5* 52.1*  MCV 100.0 98.8 97.1 96.6 96.3 96.1  PLT 145* 148* 148* 162 165 191    Basic Metabolic Panel: Recent Labs  Lab 04/04/24 0351 04/05/24 0401 04/06/24 0837 04/07/24 0722 04/08/24 0445  NA 133* 134* 137 138 137  K 4.7 4.4 4.1 3.8 4.0  CL 93* 91* 92* 88* 89*  CO2 31 34* 33* 36* 35*  GLUCOSE 124* 109* 132* 141* 121*  BUN 41* 46* 47* 51* 60*  CREATININE 1.12 1.21 1.25* 1.38* 1.33*  CALCIUM 9.6 9.2 9.6 10.0 9.5  MG  --   --  2.3 2.4 2.4  PHOS  --   --  2.9 4.9* 5.7*    GFR: CrCl cannot be calculated (Unknown ideal weight.).  Liver Function Tests: No results for input(s): AST, ALT, ALKPHOS, BILITOT, PROT, ALBUMIN in the last 168 hours.   CBG: No results for input(s): GLUCAP in the last 168 hours.    Recent Results (from the past 240 hours)  Resp panel by RT-PCR (RSV, Flu A&B, Covid) Anterior Nasal Swab     Status: Abnormal   Collection Time: 03/29/24  6:12 PM   Specimen:  Anterior Nasal Swab  Result Value Ref Range Status   SARS Coronavirus 2 by RT PCR POSITIVE (A) NEGATIVE Final   Influenza A by PCR NEGATIVE NEGATIVE Final   Influenza B by PCR NEGATIVE NEGATIVE Final    Comment: (NOTE) The Xpert Xpress SARS-CoV-2/FLU/RSV plus assay is intended as an aid in the diagnosis of influenza from Nasopharyngeal swab specimens and should not be used as a sole basis for treatment. Nasal washings and aspirates are unacceptable for Xpert Xpress SARS-CoV-2/FLU/RSV testing.  Fact Sheet for Patients: BloggerCourse.com  Fact Sheet for Healthcare Providers: SeriousBroker.it  This test is not yet approved or cleared by the [place]  FDA and has been authorized for detection and/or diagnosis of SARS-CoV-2 by FDA under an Emergency Use Authorization (EUA). This EUA will remain in effect (meaning this  test can be used) for the duration of the COVID-19 declaration under Section 564(b)(1) of the Act, 21 U.S.C. section 360bbb-3(b)(1), unless the authorization is terminated or revoked.     Resp Syncytial Virus by PCR NEGATIVE NEGATIVE Final    Comment: (NOTE) Fact Sheet for Patients: BloggerCourse.com  Fact Sheet for Healthcare Providers: SeriousBroker.it  This test is not yet approved or cleared by the [place]  FDA and has been authorized for detection and/or diagnosis of SARS-CoV-2 by FDA under an Emergency Use Authorization (EUA). This EUA will remain in effect (meaning this test can be used) for the duration of the COVID-19 declaration under Section 564(b)(1) of the Act, 21 U.S.C. section 360bbb-3(b)(1), unless the authorization is terminated or revoked.  Performed at [facility], [address], [place], [place] [address]   Blood Culture (routine x 2)     Status: None   Collection Time: 03/29/24  7:02 PM   Specimen: BLOOD RIGHT ARM  Result  Value Ref Range Status   Specimen Description BLOOD RIGHT ARM  Final   Special Requests   Final    BOTTLES DRAWN AEROBIC AND ANAEROBIC Blood Culture adequate volume   Culture   Final    NO GROWTH 5 DAYS Performed at [facility], [address], [place], [place] [address]    Report Status 04/03/2024 FINAL  Final  Blood Culture (routine x 2)     Status: None   Collection Time: 03/29/24  7:02 PM   Specimen: BLOOD LEFT ARM  Result Value Ref Range Status   Specimen Description BLOOD LEFT ARM  Final   Special Requests   Final    BOTTLES DRAWN AEROBIC AND ANAEROBIC Blood Culture adequate volume   Culture   Final    NO GROWTH 5 DAYS Performed at [facility], [address], [place], [place] [address]    Report Status 04/03/2024 FINAL  Final         Radiology Studies: No results found.       Scheduled Meds:  arformoterol   15 mcg Nebulization BID   budesonide  (PULMICORT ) nebulizer solution  0.25 mg Nebulization BID   empagliflozin  25 mg Oral Daily   enoxaparin  (LOVENOX ) injection  40 mg Subcutaneous Q24H   feeding supplement  237 mL Oral BID BM   fluticasone   2 spray Each Nare Daily   guaiFENesin   600 mg Oral BID   lactulose   30 g Oral BID   metoprolol  succinate  12.5 mg Oral Daily   pantoprazole   40 mg Oral Q1200   QUEtiapine  25 mg Oral Q24H   revefenacin   175 mcg Nebulization Daily   tamsulosin   0.4 mg Oral Daily   torsemide   20 mg Oral Daily   Continuous Infusions:   LOS: 10 days      Brayton Lye, MD [facility]   To contact the attending provider between 7A-7P or the covering provider during after hours 7P-7A, please log into the web site [email or web address] and access using universal [place] password for that web site. If you do not have the password, please call the hospital operator.  04/08/2024, 11:13 AM

## 2024-04-08 NOTE — Progress Notes (Signed)
   04/08/24 0946  Mobility  Activity Stood at bedside  Level of Assistance Moderate assist, patient does 50-74%  Assistive Device None  Activity Response Tolerated well  Mobility Referral Yes  Mobility visit 1 Mobility  Mobility Specialist Start Time (ACUTE ONLY) 0946  Mobility Specialist Stop Time (ACUTE ONLY) 0956  Mobility Specialist Time Calculation (min) (ACUTE ONLY) 10 min   Mobility Specialist: Progress Note  Pre-Mobility:      HR 75 Post-Mobility:    HR 75  Pt agreeable to mobility session - received in bed. C/o gen weakness and tremors. Returned to chair position in bed with all needs met - call bell within reach. Bed alarm on.   Additional comments: LOB x1 with instant knee buckling after STS, pt landed back on bed, MS with hand on pt throughout. Pt was able to side scoot x4 towards HOB using BUEs.Pt appeared fatigued throughout session.   Virgle Boards, BS Mobility Specialist Please contact via SecureChat or  Rehab office at [phone].

## 2024-04-09 DIAGNOSIS — J9601 Acute respiratory failure with hypoxia: Secondary | ICD-10-CM | POA: Diagnosis not present

## 2024-04-09 LAB — CBC
HCT: 52.9 % — ABNORMAL HIGH (ref 39.0–52.0)
Hemoglobin: 16.4 g/dL (ref 13.0–17.0)
MCH: 29.7 pg (ref 26.0–34.0)
MCHC: 31 g/dL (ref 30.0–36.0)
MCV: 95.8 fL (ref 80.0–100.0)
Platelets: 149 K/uL — ABNORMAL LOW (ref 150–400)
RBC: 5.52 MIL/uL (ref 4.22–5.81)
RDW: 13.6 % (ref 11.5–15.5)
WBC: 6.1 K/uL (ref 4.0–10.5)
nRBC: 0 % (ref 0.0–0.2)

## 2024-04-09 LAB — BASIC METABOLIC PANEL WITH GFR
Anion gap: 13 (ref 5–15)
BUN: 65 mg/dL — ABNORMAL HIGH (ref 8–23)
CO2: 34 mmol/L — ABNORMAL HIGH (ref 22–32)
Calcium: 9.3 mg/dL (ref 8.9–10.3)
Chloride: 91 mmol/L — ABNORMAL LOW (ref 98–111)
Creatinine, Ser: 1.66 mg/dL — ABNORMAL HIGH (ref 0.61–1.24)
GFR, Estimated: 43 mL/min — ABNORMAL LOW (ref >60)
Glucose, Bld: 121 mg/dL — ABNORMAL HIGH (ref 70–99)
Potassium: 3.4 mmol/L — ABNORMAL LOW (ref 3.5–5.1)
Sodium: 138 mmol/L (ref 135–145)

## 2024-04-09 LAB — BRAIN NATRIURETIC PEPTIDE: B Natriuretic Peptide: 47.4 pg/mL (ref 0.0–100.0)

## 2024-04-09 LAB — PHOSPHORUS: Phosphorus: 4.7 mg/dL — ABNORMAL HIGH (ref 2.5–4.6)

## 2024-04-09 MED ORDER — MIDODRINE HCL 5 MG PO TABS
5.0000 mg | ORAL_TABLET | Freq: Two times a day (BID) | ORAL | Status: DC
  Administered 2024-04-09 – 2024-04-10 (×2): 5 mg via ORAL
  Filled 2024-04-09 (×2): qty 1

## 2024-04-09 MED ORDER — POTASSIUM CHLORIDE CRYS ER 20 MEQ PO TBCR
20.0000 meq | EXTENDED_RELEASE_TABLET | Freq: Once | ORAL | Status: AC
  Administered 2024-04-09: 20 meq via ORAL
  Filled 2024-04-09: qty 1

## 2024-04-09 MED ORDER — POTASSIUM CHLORIDE CRYS ER 20 MEQ PO TBCR
40.0000 meq | EXTENDED_RELEASE_TABLET | Freq: Once | ORAL | Status: AC
  Administered 2024-04-09: 40 meq via ORAL
  Filled 2024-04-09: qty 2

## 2024-04-09 MED ORDER — SODIUM CHLORIDE 0.9 % IV SOLN: INTRAVENOUS | Status: AC

## 2024-04-09 MED ORDER — QUETIAPINE FUMARATE 25 MG PO TABS: 12.5000 mg | ORAL_TABLET | ORAL | Status: DC

## 2024-04-09 MED ORDER — ENSURE PLUS HIGH PROTEIN PO LIQD
237.0000 mL | Freq: Three times a day (TID) | ORAL | Status: DC
  Administered 2024-04-09 – 2024-04-10 (×4): 237 mL via ORAL

## 2024-04-09 NOTE — Evaluation (Signed)
 RT Evaluate and Treat Note  04/09/2024   Breathing is (select one): Same as normal    The following was found on auscultation (select multiple):  Bilateral Breath Sounds: Clear;Diminished (04/09/24 0748)  R Upper  Breath Sounds: Diminished (04/08/24 0800) L Upper Breath Sounds: Diminished (04/08/24 0800) R Lower Breath Sounds: Diminished (04/08/24 0800) L Lower Breath Sounds: Diminished (04/08/24 0800)    Cough Assessment: Cough: (P) Congested (04/08/24 0800)    Most Recent Chest Xray:... (04/04/2024 mild pulmonary edema)     The following medications and/or interventions were ordered/changed/discontinued as part of the Respiratory Treatment protocol:   Medication Changes: No changes made    Airway Clearance Changes:  No changes made    Oxygen  Therapy Changes: No changes made

## 2024-04-09 NOTE — Progress Notes (Signed)
 Physical Therapy Treatment Patient Details Name: Mitchell King MRN: [id number] DOB: 01-08-1951 Today's Date: 04/09/2024   History of Present Illness 73 y.o. male adm 03/29/24 with medical history significant of CHF, COPD, essential hypertension, non-insulin -dependent DM type II, and chronic anemia presented with worsening shortness of breath, fever and chills.    PT Comments  Pt sleeping on arrival. Slow to rouse and remained lethargic throughout session. Min assist bed mobility, and min assist transfers. Unable to progress gait due to weakness and lethargy. SpO2 in low 90s on 2L. Pt in recliner at end of session with feet elevated. Discharge rec remains appropriate for further therapy in inpatient setting < 3 hours/day.    If plan is discharge home, recommend the following: A little help with walking and/or transfers;A little help with bathing/dressing/bathroom;Assistance with cooking/housework;Direct supervision/assist for medications management;Assist for transportation;Supervision due to cognitive status;Help with stairs or ramp for entrance   Can travel by private vehicle     Yes  Equipment Recommendations  None recommended by PT    Recommendations for Other Services       Precautions / Restrictions Precautions Precautions: Fall;Other (comment) Recall of Precautions/Restrictions: Impaired Precaution/Restrictions Comments: Covid (off precautions), Chronic O2 at home     Mobility  Bed Mobility Overal bed mobility: Needs Assistance Bed Mobility: Supine to Sit     Supine to sit: Min assist, Used rails, HOB elevated     General bed mobility comments: increased time, assist with trunk    Transfers Overall transfer level: Needs assistance Equipment used: Rolling walker (2 wheels) Transfers: Sit to/from Stand, Bed to chair/wheelchair/BSC Sit to Stand: Min assist   Step pivot transfers: Min assist       General transfer comment: Tremulous and unsteady. Short, shuffle  pivot steps toward the R, bed to recliner    Ambulation/Gait               General Gait Details: unable due to weakness and lethargy   Stairs             Wheelchair Mobility     Tilt Bed    Modified Rankin (Stroke Patients Only)       Balance Overall balance assessment: Needs assistance Sitting-balance support: Feet supported, No upper extremity supported Sitting balance-Leahy Scale: Good     Standing balance support: During functional activity, Bilateral upper extremity supported Standing balance-Leahy Scale: Poor                              Communication Communication Communication: No apparent difficulties  Cognition Arousal: Lethargic Behavior During Therapy: Flat affect, WFL for tasks assessed/performed   PT - Cognitive impairments: Difficult to assess Difficult to assess due to: Level of arousal                     PT - Cognition Comments: Lethargic. Slow to rouse. Able to state the month and that we were in [place]. Following commands: Impaired Following commands impaired: Follows one step commands with increased time    Cueing Cueing Techniques: Verbal cues, Tactile cues, Gestural cues  Exercises      General Comments General comments (skin integrity, edema, etc.): SpO2 in low 90s on 2L      Pertinent Vitals/Pain Pain Assessment Pain Assessment: Faces Faces Pain Scale: Hurts little more Pain Location: low back Pain Descriptors / Indicators: Spasm, Grimacing Pain Intervention(s): Monitored during session, Repositioned    Home Living  Prior Function            PT Goals (current goals can now be found in the care plan section) Progress towards PT goals: Progressing toward goals    Frequency    Min 2X/week      PT Plan      Co-evaluation              AM-PAC PT 6 Clicks Mobility   Outcome Measure  Help needed turning from your back to your side while in  a flat bed without using bedrails?: A Little Help needed moving from lying on your back to sitting on the side of a flat bed without using bedrails?: A Little Help needed moving to and from a bed to a chair (including a wheelchair)?: A Little Help needed standing up from a chair using your arms (e.g., wheelchair or bedside chair)?: A Little Help needed to walk in hospital room?: A Lot Help needed climbing 3-5 steps with a railing? : Total 6 Click Score: 15    End of Session Equipment Utilized During Treatment: Gait belt Activity Tolerance: Patient tolerated treatment well Patient left: in chair;with call bell/phone within reach;with chair alarm set Nurse Communication: Mobility status PT Visit Diagnosis: Other abnormalities of gait and mobility (R26.89)     Time: 9095-9071 PT Time Calculation (min) (ACUTE ONLY): 24 min  Charges:    $Therapeutic Activity: 23-37 mins PT General Charges $$ ACUTE PT VISIT: 1 Visit                     Sari MATSU., PT  Office # [phone]    Erven Sari Shaker 04/09/2024, 9:50 AM

## 2024-04-09 NOTE — Progress Notes (Signed)
 PROGRESS NOTE    Mitchell King  FMW:[id number] DOB: 01/26/1951 DOA: 03/29/2024 PCP: No primary care provider on file.   Chief Complaint  Patient presents with   Shortness of Breath    Brief Narrative:   73 year old male known to have chronic respiratory failure secondary to COPD-prior tobacco-currently follows with Dr. 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 previously admitted with hyperammonemia 07/2020   Came in from home 10/9 with 2 weeks of worsening chills rigors diminished breath sounds given Solu-Medrol  in ED requiring more than 2 L of his baseline to maintain sats in the 90s-requiring 5 L and was placed on BiPAP ultimately weaned off of it in the ED Sodium 136 potassium 3.2 BUN/creatinine 7/1.1 GFR 60 BNP 1440 Lactic acid 2.2 cycling to 1.8 WBC 2.2 hemoglobin 13.7 platelet 109 differential normal Legionella pending expectorated sputum pending echo pending   COVID-positive but CRP 0.9-given a dose of remdesivir Decadron ceftriaxone and maintained only on 10/12 x-ray repeated shows emphysema and no infiltrates   Assessment & Plan:   Principal Problem:   Acute hypoxic respiratory failure (HCC) Active Problems:   COPD with acute exacerbation (HCC)   COVID-19 virus infection   Acute exacerbation of CHF (congestive heart failure) (HCC)   Essential hypertension   Non-insulin  dependent type 2 diabetes mellitus (HCC)  Acute on chronic respiratory failure with hypoxia COVID-19 infection  COPD -He is on 2 L oxygen  at home, to 6 L oxygen , this has been improving, currently on 2 to 3 L nasal cannula . .- Encouraged use incentive spirometer and flutter valve again - He was treated with Decadron given his hypoxia, finished his Decadron treatment. - He was encouraged use incentive spirometry  and flutter valve  - Followed by Dr. Kara in the outpatient setting and chronically on 2L. -Continues Flonase 2 spray daily scheduled, Mucinex  600 twice daily,   DuoNeb-add Breo Ellipta - Steroids as above for now   NSVT  - Nonsustained continues to have some PVCs - started on  low-dose Toprol -XL 25 daily -2D echo with a preserved EF 60 to 65%  Acute on chronic HFpEF last echo 60-65% with cor pulmonale Hypertension - echo shows EF similar with slightly flattened interventricular septum and moderately elevated PASP ,Right-sided elevated PASP -On diuresis, dose daily as needed, will need strict ins and out - On IV diuresis initially, was dosed as needed, he is back on his home dose torsemide  -Due to frailty and deconditioning I will hold Jardiance for now - Blood pressure has been soft so I will decrease his Toprol -XL, and start on low-dose midodrine - Will hold diuresis today given poor oral intake.  AKI on CKD stage II - Blood pressure soft, will start on midodrine, will give gentle IV fluids today, will check bladder scan and hold torsemide  today  Previous acute PEs in 2022 - Appears to have completed treatment-no role for further anticoagulation currently  Possible OSA at baseline - Requires outpatient workup  Hospital delirium -patient is significantly confused and altered this morning, most likely in setting of hospital delirium, discussed with staff, will try to get out of bed to chair, ambulate, open the curtains   Chronic liver disease - This has been reported in the past and needs to be followed up with periodic LFTs  when he is better  Impaired glucose tolerance with A1c of 5.6 and variable between 5 and 6 range -Sugars predominantly are below 140   Moderate hypokalemia -resolved  Hypokalemia  -replaced    DVT prophylaxis: (Lovenox ) Code Status: (Full) Family Communication: (none at bedside, discussed with son by phone) Disposition: SNF in a.m., will keep  another 24 hours gentle hydration to ensure creatinine at baseline  Status is: Inpatient    Consultants:  none   Subjective:  No significant events overnight, he denies any complaints, this morning weaned down to 2 L oxygen  Objective: Vitals:   04/09/24 0400 04/09/24 0743 04/09/24 0746 04/09/24 0748  BP: 105/62   102/62  Pulse: 76 81    Resp: 20 18  18   Temp: (!) 96.7 F (35.9 C)   98 F (36.7 C)  TempSrc: Axillary   Oral  SpO2: 93% 95% 96% 95%  Weight:        Intake/Output Summary (Last 24 hours) at 04/09/2024 0950 Last data filed at 04/09/2024 0653 Gross per 24 hour  Intake 240 ml  Output 1000 ml  Net -760 ml   Filed Weights   04/05/24 0500 04/06/24 0447 04/08/24 0417  Weight: 85.8 kg 86.1 kg 81.6 kg    Examination:  He is somnolent, but wakes up, pleasant, no apparent distress,asking for his breakfast Symmetrical Chest wall movement, Good air movement bilaterally, CTAB RRR,No Gallops,Rubs or new Murmurs, No Parasternal Heave +ve B.Sounds, Abd Soft, No tenderness, No rebound - guarding or rigidity. No Cyanosis, Clubbing or edema, No new Rash or bruise      Data Reviewed: I have personally reviewed following labs and imaging studies  CBC: Recent Labs  Lab 04/03/24 0447 04/04/24 0351 04/05/24 0401 04/06/24 0837 04/07/24 0722 04/08/24 0445 04/09/24 0425  WBC 4.0 3.5* 4.6 5.2 4.0 4.8 6.1  NEUTROABS 2.9 2.7 3.5 3.5 2.7  --   --   HGB 14.6 15.3 15.7 16.7 17.9* 16.6 16.4  HCT 48.5 50.3 50.7 54.1* 56.5* 52.1* 52.9*  MCV 100.0 98.8 97.1 96.6 96.3 96.1 95.8  PLT 145* 148* 148* 162 165 191 149*    Basic Metabolic Panel: Recent Labs  Lab 04/05/24 0401 04/06/24 0837 04/07/24 0722 04/08/24 0445 04/09/24 0425  NA 134* 137 138 137 138  K 4.4 4.1 3.8 4.0 3.4*  CL 91* 92* 88* 89* 91*  CO2 34* 33* 36* 35* 34*  GLUCOSE 109* 132* 141* 121* 121*  BUN 46* 47* 51* 60* 65*  CREATININE 1.21 1.25* 1.38* 1.33* 1.66*  CALCIUM 9.2 9.6 10.0 9.5 9.3  MG  --  2.3  2.4 2.4  --   PHOS  --  2.9 4.9* 5.7* 4.7*    GFR: CrCl cannot be calculated (Unknown ideal weight.).  Liver Function Tests: No results for input(s): AST, ALT, ALKPHOS, BILITOT, PROT, ALBUMIN in the last 168 hours.   CBG: No results for input(s): GLUCAP in the last 168 hours.    No results found for this or any previous visit (from the past 240 hours).        Radiology Studies: No results found.       Scheduled Meds:  arformoterol   15 mcg Nebulization BID   budesonide  (PULMICORT ) nebulizer solution  0.25 mg Nebulization BID   enoxaparin  (LOVENOX ) injection  40 mg Subcutaneous Q24H   feeding supplement  237 mL Oral TID BM   fluticasone   2 spray Each Nare Daily   guaiFENesin   600 mg Oral BID   lactulose   30 g Oral BID   metoprolol  succinate  12.5 mg Oral Daily   midodrine  5 mg Oral BID WC   pantoprazole   40 mg Oral Q1200   potassium chloride   40 mEq Oral Once   revefenacin   175 mcg Nebulization Daily   tamsulosin   0.4 mg Oral Daily   Continuous Infusions:  sodium chloride        LOS: 11 days      Brayton Lye, MD [facility]   To contact the attending provider between 7A-7P or the covering provider during after hours 7P-7A, please log into the web site [email or web address] and access using universal [place] password for that web site. If you do not have the password, please call the hospital operator.  04/09/2024, 9:50 AM

## 2024-04-09 NOTE — TOC Progression Note (Addendum)
 Transition of Care [facility]) - Progression Note    Patient Details  Name: Mitchell King MRN: [id number] Date of Birth: 1950/07/15  [facility]) CM/SW Contact  Inocente GORMAN Kindle, LCSW Phone Number: 04/09/2024, 9:01 AM  Clinical Narrative:    9:01 AM-CSW spoke with patient's son and provided SNF bed offers and ratings list. He has selected [facility]. CSW awaiting confirmation on bed availability from [facility]. PT on schedule to see patient today so insurance authorization can be started.   9:50 AM-[facility] can accept patient today pending insurance authorization. CSW submitted insurance clinicals, Ref# [id number].  Insurance approval received, effective 04/09/2024-04/11/2024.   Expected Discharge Plan: Skilled Nursing Facility Barriers to Discharge: English as a second language teacher, SNF Pending bed offer               Expected Discharge Plan and Services   Discharge Planning Services: CM Consult Post Acute Care Choice: Home Health Living arrangements for the past 2 months: Single Family Home                           HH Arranged: PT, OT, Nurse's Aide, Social Work [employer] Agency: [facility] Date HH Agency Contacted: 04/06/24 Time HH Agency Contacted: 1330 Representative spoke with at [facility] Agency: [facility]   Social Drivers of Health (SDOH) Interventions SDOH Screenings   Food Insecurity: No Food Insecurity (03/30/2024)  Housing: Low Risk  (03/30/2024)  Transportation Needs: No Transportation Needs (03/30/2024)  Utilities: Not At Risk (03/30/2024)  Social Connections: Moderately Integrated (03/30/2024)  Tobacco Use: Medium Risk (03/29/2024)    Readmission Risk Interventions     No data to display

## 2024-04-09 NOTE — Care Management Obs Status (Signed)
 MEDICARE OBSERVATION STATUS NOTIFICATION   Patient Details  Name: Mitchell King MRN: [id number] Date of Birth: 11-07-1950   Medicare Observation Status Notification Given:       Claretta Deed 04/09/2024, 3:27 PM

## 2024-04-10 DIAGNOSIS — J9601 Acute respiratory failure with hypoxia: Secondary | ICD-10-CM | POA: Diagnosis not present

## 2024-04-10 LAB — BASIC METABOLIC PANEL WITH GFR
Anion gap: 8 (ref 5–15)
BUN: 50 mg/dL — ABNORMAL HIGH (ref 8–23)
CO2: 35 mmol/L — ABNORMAL HIGH (ref 22–32)
Calcium: 9.4 mg/dL (ref 8.9–10.3)
Chloride: 95 mmol/L — ABNORMAL LOW (ref 98–111)
Creatinine, Ser: 1.24 mg/dL (ref 0.61–1.24)
GFR, Estimated: >60 mL/min (ref >60)
Glucose, Bld: 116 mg/dL — ABNORMAL HIGH (ref 70–99)
Potassium: 3.7 mmol/L (ref 3.5–5.1)
Sodium: 138 mmol/L (ref 135–145)

## 2024-04-10 LAB — CBC
HCT: 52 % (ref 39.0–52.0)
Hemoglobin: 15.9 g/dL (ref 13.0–17.0)
MCH: 29.7 pg (ref 26.0–34.0)
MCHC: 30.6 g/dL (ref 30.0–36.0)
MCV: 97 fL (ref 80.0–100.0)
Platelets: 151 K/uL (ref 150–400)
RBC: 5.36 MIL/uL (ref 4.22–5.81)
RDW: 13.6 % (ref 11.5–15.5)
WBC: 7.6 K/uL (ref 4.0–10.5)
nRBC: 0 % (ref 0.0–0.2)

## 2024-04-10 LAB — PHOSPHORUS: Phosphorus: 2.5 mg/dL (ref 2.5–4.6)

## 2024-04-10 MED ORDER — ENSURE PLUS HIGH PROTEIN PO LIQD: 237.0000 mL | Freq: Three times a day (TID) | ORAL | Status: AC

## 2024-04-10 MED ORDER — ACETAMINOPHEN 325 MG PO TABS: 650.0000 mg | ORAL_TABLET | Freq: Four times a day (QID) | ORAL | Status: AC | PRN

## 2024-04-10 MED ORDER — BUDESONIDE 0.25 MG/2ML IN SUSP: 0.2500 mg | Freq: Two times a day (BID) | RESPIRATORY_TRACT | Status: AC

## 2024-04-10 MED ORDER — FUROSEMIDE 20 MG PO TABS: 40.0000 mg | ORAL_TABLET | Freq: Every day | ORAL | Status: AC | PRN

## 2024-04-10 MED ORDER — METOPROLOL SUCCINATE ER 25 MG PO TB24: 12.5000 mg | ORAL_TABLET | Freq: Every day | ORAL | Status: DC

## 2024-04-10 MED ORDER — PANTOPRAZOLE SODIUM 40 MG PO TBEC: 40.0000 mg | DELAYED_RELEASE_TABLET | Freq: Every day | ORAL | Status: AC

## 2024-04-10 MED ORDER — MIDODRINE HCL 5 MG PO TABS: 5.0000 mg | ORAL_TABLET | Freq: Three times a day (TID) | ORAL | Status: AC

## 2024-04-10 NOTE — TOC Progression Note (Signed)
 Transition of Care [facility]) - Progression Note    Patient Details  Name: Mitchell King MRN: [id number] Date of Birth: 19-Oct-1950  [facility]) CM/SW Contact  Inocente GORMAN Kindle, LCSW Phone Number: 04/10/2024, 9:16 AM  Clinical Narrative:    CSW updated patient's son on discharge today and he requested [facility] for transport. CSW confirmed [facility] can accept patient today.    Expected Discharge Plan: Skilled Nursing Facility Barriers to Discharge: Barriers Resolved               Expected Discharge Plan and Services In-house Referral: Clinical Social Work Discharge Planning Services: CM Consult Post Acute Care Choice: Skilled Nursing Facility Living arrangements for the past 2 months: Single Family Home Expected Discharge Date: 04/10/24                         HH Arranged: PT, OT, Nurse's Aide, Social Work [employer] Agency: [facility] Date HH Agency Contacted: 04/06/24 Time HH Agency Contacted: 1330 Representative spoke with at [facility] Agency: [facility]   Social Drivers of Health (SDOH) Interventions SDOH Screenings   Food Insecurity: No Food Insecurity (03/30/2024)  Housing: Low Risk  (03/30/2024)  Transportation Needs: No Transportation Needs (03/30/2024)  Utilities: Not At Risk (03/30/2024)  Social Connections: Moderately Integrated (03/30/2024)  Tobacco Use: Medium Risk (03/29/2024)    Readmission Risk Interventions     No data to display

## 2024-04-10 NOTE — Evaluation (Signed)
 RT Evaluate and Treat Note  04/10/2024   Breathing is (select one): Same as normal    The following was found on auscultation (select multiple):  Bilateral Breath Sounds: Diminished;Clear (04/10/24 0759)  R Upper  Breath Sounds: Clear (04/10/24 0759) L Upper Breath Sounds: Clear (04/10/24 0759) R Lower Breath Sounds: Diminished (04/10/24 0759) L Lower Breath Sounds: Diminished (04/10/24 0759)    Cough Assessment: Cough: (P) Congested (04/08/24 0800)    Most Recent Chest Xray:... (No results found.    The following medications and/or interventions were ordered/changed/discontinued as part of the Respiratory Treatment protocol:   Medication Changes: None   Airway Clearance Changes: None   Oxygen  Therapy Changes: None

## 2024-04-10 NOTE — Progress Notes (Addendum)
 Occupational Therapy Treatment Patient Details Name: Mitchell King MRN: [id number] DOB: 1951/01/25 Today's Date: 04/10/2024   History of present illness 73 y.o. male adm 03/29/24 with medical history significant of CHF, COPD, essential hypertension, non-insulin -dependent DM type II, and chronic anemia presented with worsening shortness of breath, fever and chills.   OT comments  Patient up in recliner, agreeable to session.  Focus on sit to stands and transfers to maximize toileting independence.  Patient needing up to Min A for sit to stand and closer to [facility] for short bouts of mobility at RW level.  Patient scheduled for discharge this date to SNF to continue rehab for eventual return home.        If plan is discharge home, recommend the following:  A little help with walking and/or transfers;A little help with bathing/dressing/bathroom;Assistance with cooking/housework;Assist for transportation;Direct supervision/assist for medications management   Equipment Recommendations  None recommended by OT    Recommendations for Other Services      Precautions / Restrictions Precautions Precautions: Fall;Other (comment) Recall of Precautions/Restrictions: Impaired Precaution/Restrictions Comments: Covid (off precautions), Chronic O2 at home Restrictions Weight Bearing Restrictions Per Provider Order: No       Mobility Bed Mobility Overal bed mobility: Needs Assistance Bed Mobility: Sit to Supine       Sit to supine: Contact guard assist     Patient Response: Cooperative  Transfers Overall transfer level: Needs assistance Equipment used: Rolling walker (2 wheels) Transfers: Sit to/from Stand, Bed to chair/wheelchair/BSC Sit to Stand: Min assist     Step pivot transfers: Contact guard assist           Balance Overall balance assessment: Needs assistance Sitting-balance support: Feet supported, No upper extremity supported Sitting balance-Leahy Scale: Good      Standing balance support: Reliant on assistive device for balance Standing balance-Leahy Scale: Poor                             ADL either performed or assessed with clinical judgement   ADL                                              Extremity/Trunk Assessment Upper Extremity Assessment Upper Extremity Assessment: Generalized weakness   Lower Extremity Assessment Lower Extremity Assessment: Defer to PT evaluation   Cervical / Trunk Assessment Cervical / Trunk Assessment: Normal    Vision Baseline Vision/History: 1 Wears glasses Patient Visual Report: No change from baseline     Perception Perception Perception: Not tested   Praxis Praxis Praxis: Not tested   Communication Communication Communication: No apparent difficulties   Cognition Arousal: Alert Behavior During Therapy: Flat affect Cognition: Cognition impaired       Memory impairment (select all impairments): Short-term memory Attention impairment (select first level of impairment): Sustained attention Executive functioning impairment (select all impairments): Sequencing, Reasoning                   Following commands: Impaired Following commands impaired: Follows one step commands with increased time      Cueing   Cueing Techniques: Verbal cues, Tactile cues, Gestural cues  Exercises      Shoulder Instructions       General Comments      Pertinent Vitals/ Pain       Pain Assessment Pain Assessment:  No/denies pain Pain Intervention(s): Monitored during session                                                          Frequency  Min 2X/week        Progress Toward Goals  OT Goals(current goals can now be found in the care plan section)  Progress towards OT goals: Progressing toward goals  Acute Rehab OT Goals OT Goal Formulation: With patient Time For Goal Achievement: 04/19/24 Potential to Achieve Goals: Good   Plan      Co-evaluation                 AM-PAC OT 6 Clicks Daily Activity     Outcome Measure   Help from another person eating meals?: None Help from another person taking care of personal grooming?: A Little Help from another person toileting, which includes using toliet, bedpan, or urinal?: A Little Help from another person bathing (including washing, rinsing, drying)?: A Little Help from another person to put on and taking off regular upper body clothing?: A Little Help from another person to put on and taking off regular lower body clothing?: A Lot 6 Click Score: 18    End of Session Equipment Utilized During Treatment: Rolling walker (2 wheels);Oxygen   OT Visit Diagnosis: Unsteadiness on feet (R26.81);Muscle weakness (generalized) (M62.81)   Activity Tolerance Patient tolerated treatment well   Patient Left in bed;with call bell/phone within reach;with bed alarm set   Nurse Communication Mobility status        Time: 0950-1010 OT Time Calculation (min): 20 min  Charges: OT General Charges $OT Visit: 1 Visit OT Treatments $Therapeutic Activity: 8-22 mins  04/10/2024  RP, OTR/L  Acute Rehabilitation Services  Office:  [phone]   Mitchell King 04/10/2024, 10:14 AM

## 2024-04-10 NOTE — Discharge Summary (Signed)
 Physician Discharge Summary  Mitchell King FMW:[id number] DOB: 10/29/1950 DOA: 03/29/2024  PCP: No primary care provider on file.  Admit date: 03/29/2024 Discharge date: 04/10/2024  Admitted From: (Home) Disposition:  (SNF)  Recommendations for Outpatient Follow-up:  Please keep encouraging to use incentive spirometer Please keep offering nutritional supplements On oxygen  2 to 3 L cannula at baseline Monitor volume status closely, diuresis only as needed   Diet recommendation: Regular diet  Brief/Interim Summary:  73 year old male known to have chronic respiratory failure secondary to COPD-prior tobacco-currently follows with Dr. 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 previously admitted with hyperammonemia 07/2020   Came in from home 10/9 with 2 weeks of worsening chills rigors diminished breath sounds given Solu-Medrol  in ED requiring more than 2 L of his baseline to maintain sats in the 90s-requiring 5 L and was placed on BiPAP ultimately weaned off of it in the ED Sodium 136 potassium 3.2 BUN/creatinine 7/1.1 GFR 60 BNP 1440 Lactic acid 2.2 cycling to 1.8 WBC 2.2 hemoglobin 13.7 platelet 109 differential normal Legionella pending expectorated sputum pending echo pending   COVID-positive but CRP 0.9-given a dose of remdesivir Decadron ceftriaxone and maintained only on 10/12 x-ray repeated shows emphysema and no infiltrates   Acute on chronic respiratory failure with hypoxia COVID-19 infection  COPD -He is on 2 L oxygen  at home, he required up to 6 L oxygen , but currently this has improved, he is back to baseline 2 to 3 L oxygen  nasal cannula.  - Encouraged use incentive spirometer and flutter valve again, please continue to do so at your facility till he is more ambulatory - He was treated  with Decadron given his hypoxia, finished his Decadron treatment. - Followed by Dr. Kara in the outpatient setting and chronically on 2L. -Continues Flonase 2 spray daily scheduled, Mucinex  600 twice daily,   DuoNeb-add Breo Ellipta -He is off COVID isolation as of 04/08/2024   NSVT  - Nonsustained continues to have some PVCs - started on  low-dose Toprol -XL 25 daily -2D echo with a preserved EF 60 to 65%   Acute on chronic HFpEF last echo 60-65% with cor pulmonale - echo shows EF similar with slightly flattened interventricular septum and moderately elevated PASP ,Right-sided elevated PASP -Diuresis initially held, and he required IV fluids, he did show signs of volume overload as well, for which he was on IV diuresis intermittently, at this point he appears to be euvolemic on discharge, and given his hospital delirium and  frailty, will keep only on as needed Lasix  if he started to develop signs of volume overload, but he is euvolemic at time of discharge - Jardiance has been discontinued, this can be resumed once oral intake has improved  Hypotension - Blood pressure has been on the lower side during hospital stay, so he was started on midodrine    AKI on CKD stage II -Resolved, renal function back at baseline, no evidence of retention. - Will change diuresis to as needed  Previous acute PEs in 2022 - Appears to have completed treatment-no role for further anticoagulation currently   Possible OSA at baseline - Requires outpatient workup, he is not on CPAP   Hospital delirium -Given COVID-19 and prolonged hospitalization, patient with hospital delirium, this has improved, but continue with delirium  precaution      Chronic liver disease - This has been reported in the past and needs to be followed up with periodic LFTs when he is better   Impaired glucose tolerance with A1c of 5.6 and variable between 5 and 6 range -Sugars predominantly are below 140     Moderate  hypokalemia -resolved   Hypokalemia  -replaced        Discharge Diagnoses:  Principal Problem:   Acute hypoxic respiratory failure (HCC) Active Problems:   COPD with acute exacerbation (HCC)   COVID-19 virus infection   Acute exacerbation of CHF (congestive heart failure) (HCC)   Essential hypertension   Non-insulin  dependent type 2 diabetes mellitus (HCC)    Discharge Instructions  Discharge Instructions     Diet - low sodium heart healthy   Complete by: As directed    Discharge instructions   Complete by: As directed    Follow with SNF provider  Get CBC, CMP,  by Primary MD next visit.    Activity: As tolerated with Full fall precautions use walker/cane & assistance as needed   Disposition SNF   Diet: Heart Healthy   On your next visit with your primary care physician please Get Medicines reviewed and adjusted.   Please request your Prim.MD to go over all Hospital Tests and Procedure/Radiological results at the follow up, please get all Hospital records sent to your Prim MD by signing hospital release before you go home.   If you experience worsening of your admission symptoms, develop shortness of breath, life threatening emergency, suicidal or homicidal thoughts you must seek medical attention immediately by calling 911 or calling your MD immediately  if symptoms less severe.  You Must read complete instructions/literature along with all the possible adverse reactions/side effects for all the Medicines you take and that have been prescribed to you. Take any new Medicines after you have completely understood and accpet all the possible adverse reactions/side effects.   Do not drive, operating heavy machinery, perform activities at heights, swimming or participation in water  activities or provide baby sitting services if your were admitted for syncope or siezures until you have seen by Primary MD or a Neurologist and advised to do so again.  Do not drive when  taking Pain medications.    Do not take more than prescribed Pain, Sleep and Anxiety Medications  Special Instructions: If you have smoked or chewed Tobacco  in the last 2 yrs please stop smoking, stop any regular Alcohol  and or any Recreational drug use.  Wear Seat belts while driving.   Please note  You were cared for by a hospitalist during your hospital stay. If you have any questions about your discharge medications or the care you received while you were in the hospital after you are discharged, you can call the unit and asked to speak with the hospitalist on call if the hospitalist that took care of you is not available. Once you are discharged, your primary care physician will handle any further medical issues. Please note that NO REFILLS for any discharge medications will be authorized once you are discharged, as it is imperative that you return to your primary care physician (or establish a relationship with a primary care physician if you do not have one) for your aftercare needs so that they can reassess your need for medications and monitor your lab values.   Increase activity slowly   Complete by: As directed       Allergies as  of 04/10/2024   No Known Allergies      Medication List     STOP taking these medications    empagliflozin 25 MG Tabs tablet Commonly known as: JARDIANCE   torsemide  20 MG tablet Commonly known as: DEMADEX        TAKE these medications    acetaminophen  500 MG tablet Commonly known as: TYLENOL  Take 1,000 mg by mouth every morning. What changed: Another medication with the same name was added. Make sure you understand how and when to take each.   acetaminophen  325 MG tablet Commonly known as: TYLENOL  Take 2 tablets (650 mg total) by mouth every 6 (six) hours as needed for mild pain (pain score 1-3) or fever (or Fever >/= 101). What changed: You were already taking a medication with the same name, and this prescription was added. Make  sure you understand how and when to take each.   albuterol  108 (90 Base) MCG/ACT inhaler Commonly known as: VENTOLIN  HFA Inhale 2 puffs into the lungs every 4 (four) hours as needed for wheezing or shortness of breath.   budesonide  0.25 MG/2ML nebulizer solution Commonly known as: PULMICORT  Take 2 mLs (0.25 mg total) by nebulization 2 (two) times daily.   feeding supplement Liqd Take 237 mLs by mouth 3 (three) times daily between meals.   FeroSul 325 (65 FE) MG tablet Generic drug: ferrous sulfate  Take 325 mg by mouth daily with breakfast.   furosemide  20 MG tablet Commonly known as: Lasix  Take 2 tablets (40 mg total) by mouth daily as needed for fluid or edema.   ipratropium-albuterol  0.5-2.5 (3) MG/3ML Soln Commonly known as: DUONEB Take 3 mLs by nebulization every 4 (four) hours as needed.   lactulose  (encephalopathy) 10 GM/15ML Soln Commonly known as: CHRONULAC  Take 30 g by mouth 2 (two) times daily.   metoprolol  succinate 25 MG 24 hr tablet Commonly known as: TOPROL -XL Take 0.5 tablets (12.5 mg total) by mouth daily. Start taking on: April 11, 2024   midodrine 5 MG tablet Commonly known as: PROAMATINE Take 1 tablet (5 mg total) by mouth 3 (three) times daily with meals.   pantoprazole  40 MG tablet Commonly known as: PROTONIX  Take 1 tablet (40 mg total) by mouth daily at 12 noon.   Stiolto Respimat  2.5-2.5 MCG/ACT Aers Generic drug: Tiotropium Bromide -Olodaterol Inhale 2 puffs into the lungs daily.   tamsulosin  0.4 MG Caps capsule Commonly known as: FLOMAX  Take 0.4 mg by mouth daily.        Contact information for follow-up providers     [facility]) Follow up.   Specialty: Home Health Services Why: Someone from [facility] will contact you to arrange start date and time for your physical therapy, Occupational therapy and aide services. Contact information: [address] [place]  [place] [address] [phone]             Contact information for after-discharge care     Destination     [facility] .   Service: Skilled Nursing Contact information: [address] [place]  [address] [phone]                    No Known Allergies  Consultations: None   Procedures/Studies: DG Chest 2 View Result Date: 04/04/2024 EXAM: 2 VIEW(S) XRAY OF THE CHEST 04/04/2024 05:47:14 AM COMPARISON: 04/01/2024 CLINICAL HISTORY: Pleural effusion 142230. Encounter for pleural effusion. FINDINGS: LUNGS AND PLEURA: No  focal pulmonary opacity. Mild pulmonary edema. No pleural effusion. No pneumothorax. HEART AND MEDIASTINUM: Stable cardiomegaly. Aortic atherosclerosis. BONES AND SOFT TISSUES: No acute osseous abnormality. IMPRESSION: 1. Mild pulmonary edema. 2. Cardiomegaly. Electronically signed by: Donnice Mania MD 04/04/2024 09:54 AM EDT RP Workstation: [id number]   DG CHEST PORT 1 VIEW Result Date: 04/01/2024 CLINICAL DATA:  [id number] PNA (pneumonia) [id number] EXAM: PORTABLE CHEST 1 VIEW COMPARISON:  March 29, 2024, April 13, 2021 FINDINGS: The cardiomediastinal silhouette is unchanged and enlarged in contour.Atherosclerotic calcifications. No pleural effusion. No pneumothorax. Emphysematous changes with background of bronchitic prominence. No acute pleuroparenchymal abnormality. IMPRESSION: Emphysematous changes and findings suggestive of COPD without acute cardiopulmonary abnormality. Given underlying emphysema, recommend evaluation for candidacy for annual lung cancer screening CT. Electronically Signed   By: Corean Salter M.D.   On: 04/01/2024 09:55   ECHOCARDIOGRAM COMPLETE Result Date: 03/30/2024    ECHOCARDIOGRAM REPORT   Patient Name:   Mitchell King Date of Exam: 03/30/2024 Medical Rec #:  [id number]       Height:       73.0 in Accession #:    [id number]      Weight:       209.0 lb Date of Birth:  August 29, 1950         BSA:          2.192 m Patient Age:    73 years        BP:           178/99 mmHg Patient Gender: M               HR:           110 bpm. Exam Location:  Inpatient Procedure: 2D Echo, Cardiac Doppler and Color Doppler (Both Spectral and Color            Flow Doppler were utilized during procedure). Indications:    CHF  History:        Patient has prior history of Echocardiogram examinations, most                 recent 04/14/2021. CHF, COPD, Signs/Symptoms:Dyspnea; Risk                 Factors:Hypertension.  Sonographer:    Philomena Daring Referring Phys: SUBRINA SUNDIL IMPRESSIONS  1. Left ventricular ejection fraction, by estimation, is 60 to 65%. The left ventricle has normal function. The left ventricle has no regional wall motion abnormalities. There is moderate concentric left ventricular hypertrophy. Left ventricular diastolic parameters are indeterminate. There is the interventricular septum is flattened in systole and diastole, consistent with right ventricular pressure and volume overload.  2. Right ventricular systolic function is normal. The right ventricular size is moderately enlarged. There is moderately elevated pulmonary artery systolic pressure. The estimated right ventricular systolic pressure is 48.2 mmHg.  3. Left atrial size was mildly dilated.  4. Right atrial size was moderately dilated.  5. The mitral valve is grossly normal. Trivial mitral valve regurgitation. No evidence of mitral stenosis.  6. The aortic valve is grossly normal. There is mild calcification of the aortic valve. There is mild thickening of the aortic valve. Aortic valve regurgitation is trivial. Aortic valve sclerosis is present, with no evidence of aortic valve stenosis.  7. The inferior vena cava is dilated in size with <50% respiratory variability, suggesting right atrial pressure of 15 mmHg. Comparison(s): No significant change from prior study. Conclusion(s)/Recommendation(s): RV pressure/volume overload with elevated  pulmonary pressures. FINDINGS  Left  Ventricle: Left ventricular ejection fraction, by estimation, is 60 to 65%. The left ventricle has normal function. The left ventricle has no regional wall motion abnormalities. The left ventricular internal cavity size was normal in size. There is  moderate concentric left ventricular hypertrophy. The interventricular septum is flattened in systole and diastole, consistent with right ventricular pressure and volume overload. Left ventricular diastolic parameters are indeterminate. Right Ventricle: The right ventricular size is moderately enlarged. Right vetricular wall thickness was not well visualized. Right ventricular systolic function is normal. There is moderately elevated pulmonary artery systolic pressure. The tricuspid regurgitant velocity is 2.88 m/s, and with an assumed right atrial pressure of 15 mmHg, the estimated right ventricular systolic pressure is 48.2 mmHg. Left Atrium: Left atrial size was mildly dilated. Right Atrium: Right atrial size was moderately dilated. Pericardium: There is no evidence of pericardial effusion. Mitral Valve: The mitral valve is grossly normal. Trivial mitral valve regurgitation. No evidence of mitral valve stenosis. Tricuspid Valve: The tricuspid valve is not well visualized. Tricuspid valve regurgitation is mild . No evidence of tricuspid stenosis. Aortic Valve: The aortic valve is grossly normal. There is mild calcification of the aortic valve. There is mild thickening of the aortic valve. Aortic valve regurgitation is trivial. Aortic valve sclerosis is present, with no evidence of aortic valve stenosis. Pulmonic Valve: The pulmonic valve was not well visualized. Pulmonic valve regurgitation is trivial. No evidence of pulmonic stenosis. Aorta: The aortic root and ascending aorta are structurally normal, with no evidence of dilitation. Venous: The inferior vena cava is dilated in size with less than 50% respiratory variability,  suggesting right atrial pressure of 15 mmHg. IAS/Shunts: There is left bowing of the interatrial septum, suggestive of elevated right atrial pressure. The atrial septum is grossly normal.  LEFT VENTRICLE PLAX 2D LVIDd:         4.60 cm   Diastology LVIDs:         3.10 cm   LV e' medial:    6.74 cm/s LV PW:         1.20 cm   LV E/e' medial:  14.2 LV IVS:        1.50 cm   LV e' lateral:   6.64 cm/s LVOT diam:     2.30 cm   LV E/e' lateral: 14.4 LV SV:         67 LV SV Index:   31 LVOT Area:     4.15 cm  RIGHT VENTRICLE             IVC RV S prime:     10.20 cm/s  IVC diam: 3.40 cm TAPSE (M-mode): 2.0 cm LEFT ATRIUM             Index        RIGHT ATRIUM           Index LA diam:        3.70 cm 1.69 cm/m   RA Area:     28.60 cm LA Vol (A2C):   53.2 ml 24.27 ml/m  RA Volume:   101.00 ml 46.07 ml/m LA Vol (A4C):   43.2 ml 19.71 ml/m LA Biplane Vol: 50.8 ml 23.17 ml/m  AORTIC VALVE LVOT Vmax:   98.00 cm/s LVOT Vmean:  64.500 cm/s LVOT VTI:    0.162 m  AORTA Ao Root diam: 3.30 cm Ao Asc diam:  3.50 cm MITRAL VALVE               TRICUSPID  VALVE MV Area (PHT): 4.39 cm    TR Peak grad:   33.2 mmHg MV Decel Time: 173 msec    TR Vmax:        288.00 cm/s MV E velocity: 95.40 cm/s MV A velocity: 74.40 cm/s  SHUNTS MV E/A ratio:  1.28        Systemic VTI:  0.16 m                            Systemic Diam: 2.30 cm Shelda Bruckner MD Electronically signed by Shelda Bruckner MD Signature Date/Time: 03/30/2024/11:56:14 AM    Final    DG Chest Port 1 View Result Date: 03/29/2024 CLINICAL DATA:  dib short of breath. EXAM: PORTABLE CHEST 1 VIEW COMPARISON:  Chest x-ray 06/13/2021 FINDINGS: The heart and mediastinal contours are unchanged. Atherosclerotic plaque. Prominent hilar vasculature. Bibasilar atelectasis. No focal consolidation. Chronic coarsened interstitial markings with no overt pulmonary edema. No pleural effusion. No pneumothorax. No acute osseous abnormality. IMPRESSION: 1. Cardiomegaly with pulmonary  venous congestion. 2. Aortic Atherosclerosis (ICD10-I70.0) and Emphysema (ICD10-J43.9). Electronically Signed   By: Morgane  Naveau M.D.   On: 03/29/2024 18:53     Subjective: Significant events overnight as discussed with staff, patient had loose BM overnight due to lactulose , but otherwise he denies any complaints  Discharge Exam: Vitals:   04/10/24 0700 04/10/24 0809  BP: 116/78 91/80  Pulse:  97  Resp:    Temp: 98.1 F (36.7 C)   SpO2:     Vitals:   04/10/24 0000 04/10/24 0400 04/10/24 0700 04/10/24 0809  BP: 113/66 122/76 116/78 91/80  Pulse: 82 77  97  Resp: 16 20    Temp: 98.1 F (36.7 C) 98.1 F (36.7 C) 98.1 F (36.7 C)   TempSrc: Oral Axillary Oral   SpO2: 92% 93%    Weight:        General: Pt is alert, awake, frail, deconditioned, mildly confused Cardiovascular: RRR, S1/S2 +, no rubs, no gallops Respiratory: CTA bilaterally, no wheezing, no rhonchi Abdominal: Soft, NT, ND, bowel sounds + Extremities: no edema, no cyanosis    The results of significant diagnostics from this hospitalization (including imaging, microbiology, ancillary and laboratory) are listed below for reference.     Microbiology: No results found for this or any previous visit (from the past 240 hours).   Labs: BNP (last 3 results) Recent Labs    03/29/24 1757 04/05/24 0401 04/09/24 0424  BNP 1,440.7* 190.8* 47.4   Basic Metabolic Panel: Recent Labs  Lab 04/06/24 0837 04/07/24 0722 04/08/24 0445 04/09/24 0425 04/10/24 0344  NA 137 138 137 138 138  K 4.1 3.8 4.0 3.4* 3.7  CL 92* 88* 89* 91* 95*  CO2 33* 36* 35* 34* 35*  GLUCOSE 132* 141* 121* 121* 116*  BUN 47* 51* 60* 65* 50*  CREATININE 1.25* 1.38* 1.33* 1.66* 1.24  CALCIUM 9.6 10.0 9.5 9.3 9.4  MG 2.3 2.4 2.4  --   --   PHOS 2.9 4.9* 5.7* 4.7* 2.5   Liver Function Tests: No results for input(s): AST, ALT, ALKPHOS, BILITOT, PROT, ALBUMIN in the last 168 hours. No results for input(s): LIPASE,  AMYLASE in the last 168 hours. No results for input(s): AMMONIA in the last 168 hours. CBC: Recent Labs  Lab 04/04/24 0351 04/05/24 0401 04/06/24 0837 04/07/24 0722 04/08/24 0445 04/09/24 0425 04/10/24 0344  WBC 3.5* 4.6 5.2 4.0 4.8 6.1 7.6  NEUTROABS 2.7 3.5 3.5 2.7  --   --   --  HGB 15.3 15.7 16.7 17.9* 16.6 16.4 15.9  HCT 50.3 50.7 54.1* 56.5* 52.1* 52.9* 52.0  MCV 98.8 97.1 96.6 96.3 96.1 95.8 97.0  PLT 148* 148* 162 165 191 149* 151   Cardiac Enzymes: No results for input(s): CKTOTAL, CKMB, CKMBINDEX, TROPONINI in the last 168 hours. BNP: Invalid input(s): POCBNP CBG: No results for input(s): GLUCAP in the last 168 hours. D-Dimer No results for input(s): DDIMER in the last 72 hours. Hgb A1c No results for input(s): HGBA1C in the last 72 hours. Lipid Profile No results for input(s): CHOL, HDL, LDLCALC, TRIG, CHOLHDL, LDLDIRECT in the last 72 hours. Thyroid function studies No results for input(s): TSH, T4TOTAL, T3FREE, THYROIDAB in the last 72 hours.  Invalid input(s): FREET3 Anemia work up No results for input(s): VITAMINB12, FOLATE, FERRITIN, TIBC, IRON, RETICCTPCT in the last 72 hours. Urinalysis    Component Value Date/Time   COLORURINE YELLOW 03/29/2024 2205   APPEARANCEUR CLEAR 03/29/2024 2205   LABSPEC 1.009 03/29/2024 2205   PHURINE 5.0 03/29/2024 2205   GLUCOSEU >=500 (A) 03/29/2024 2205   HGBUR MODERATE (A) 03/29/2024 2205   BILIRUBINUR NEGATIVE 03/29/2024 2205   BILIRUBINUR neg 04/20/2017 1447   KETONESUR NEGATIVE 03/29/2024 2205   PROTEINUR 100 (A) 03/29/2024 2205   UROBILINOGEN 1.0 04/20/2017 1447   NITRITE NEGATIVE 03/29/2024 2205   LEUKOCYTESUR NEGATIVE 03/29/2024 2205   Sepsis Labs Recent Labs  Lab 04/07/24 0722 04/08/24 0445 04/09/24 0425 04/10/24 0344  WBC 4.0 4.8 6.1 7.6   Microbiology No results found for this or any previous visit (from the past 240 hours).   Time  coordinating discharge: Over 30 minutes  SIGNED:   Brayton Lye, MD  [facility] 04/10/2024, 9:24 AM Pager   If 7PM-7AM, please contact night-coverage [email or web address]

## 2024-04-10 NOTE — TOC Transition Note (Signed)
 Transition of Care [facility]) - Discharge Note   Patient Details  Name: Mitchell King MRN: [id number] Date of Birth: Nov 21, 1950  [facility] of [facility]) CM/SW Contact:  Inocente GORMAN Kindle, LCSW Phone Number: 04/10/2024, 11:12 AM   Clinical Narrative:    Patient will DC to: [facility] Anticipated DC date: 04/10/24 Family notified: Son, Mitchell King Transport by: [place]   Per MD patient ready for DC to [facility]. RN to call report prior to discharge [phone] room [phone]). RN, patient, patient's family, and facility notified of DC. Discharge Summary and FL2 sent to facility. DC packet on chart. Ambulance transport requested for patient.   CSW will sign off for now as social work intervention is no longer needed. Please consult us  again if new needs arise.     Final next level of care: Skilled Nursing Facility Barriers to Discharge: Barriers Resolved   Patient Goals and CMS Choice Patient states their goals for this hospitalization and ongoing recovery are:: Rehab CMS Medicare.gov Compare Post Acute Care list provided to:: Patient Represenative (must comment) Choice offered to / list presented to : Adult Children, Patient [place] ownership interest in [facility].provided to:: Adult Children    Discharge Placement   Existing PASRR number confirmed : 04/10/24          Patient [facility] bed at: [facility] Patient to be transferred to facility by: [facility] Name of family member notified: Son Patient and family notified of of transfer: 04/10/24  Discharge Plan and Services Additional resources added to the After Visit Summary for   In-house Referral: Clinical Social Work Discharge Planning Services: CM Consult Post Acute Care Choice: Skilled Nursing Facility                    HH Arranged: PT, OT, Nurse's Aide, Social Work [employer] Agency: [facility] Date [facility] Agency Contacted: 04/06/24 Time HH Agency Contacted: 1330 Representative spoke with at [facility] Agency:  [place]  Social Drivers of Health (SDOH) Interventions SDOH Screenings   Food Insecurity: No Food Insecurity (03/30/2024)  Housing: Low Risk  (03/30/2024)  Transportation Needs: No Transportation Needs (03/30/2024)  Utilities: Not At Risk (03/30/2024)  Social Connections: Moderately Integrated (03/30/2024)  Tobacco Use: Medium Risk (03/29/2024)     Readmission Risk Interventions     No data to display

## 2024-04-10 NOTE — Plan of Care (Signed)
  Problem: Education: Goal: Ability to describe self-care measures that may prevent or decrease complications (Diabetes Survival Skills Education) will improve Outcome: Completed/Met Goal: Individualized Educational Video(s) Outcome: Completed/Met   Problem: Coping: Goal: Ability to adjust to condition or change in health will improve Outcome: Completed/Met   Problem: Fluid Volume: Goal: Ability to maintain a balanced intake and output will improve Outcome: Completed/Met   Problem: Health Behavior/Discharge Planning: Goal: Ability to identify and utilize available resources and services will improve Outcome: Completed/Met Goal: Ability to manage health-related needs will improve Outcome: Completed/Met   Problem: Metabolic: Goal: Ability to maintain appropriate glucose levels will improve Outcome: Completed/Met   Problem: Nutritional: Goal: Maintenance of adequate nutrition will improve Outcome: Completed/Met Goal: Progress toward achieving an optimal weight will improve Outcome: Completed/Met   Problem: Skin Integrity: Goal: Risk for impaired skin integrity will decrease Outcome: Completed/Met   Problem: Tissue Perfusion: Goal: Adequacy of tissue perfusion will improve Outcome: Completed/Met   Problem: Education: Goal: Knowledge of General Education information will improve Description: Including pain rating scale, medication(s)/side effects and non-pharmacologic comfort measures Outcome: Completed/Met   Problem: Health Behavior/Discharge Planning: Goal: Ability to manage health-related needs will improve Outcome: Completed/Met   Problem: Clinical Measurements: Goal: Ability to maintain clinical measurements within normal limits will improve Outcome: Completed/Met Goal: Will remain free from infection Outcome: Completed/Met Goal: Diagnostic test results will improve Outcome: Completed/Met Goal: Respiratory complications will improve Outcome: Completed/Met Goal:  Cardiovascular complication will be avoided Outcome: Completed/Met   Problem: Activity: Goal: Risk for activity intolerance will decrease Outcome: Completed/Met   Problem: Nutrition: Goal: Adequate nutrition will be maintained Outcome: Completed/Met   Problem: Coping: Goal: Level of anxiety will decrease Outcome: Completed/Met   Problem: Elimination: Goal: Will not experience complications related to bowel motility Outcome: Completed/Met Goal: Will not experience complications related to urinary retention Outcome: Completed/Met   Problem: Pain Managment: Goal: General experience of comfort will improve and/or be controlled Outcome: Completed/Met   Problem: Safety: Goal: Ability to remain free from injury will improve Outcome: Completed/Met   Problem: Skin Integrity: Goal: Risk for impaired skin integrity will decrease Outcome: Completed/Met   Problem: Education: Goal: Knowledge of disease or condition will improve Outcome: Completed/Met Goal: Knowledge of the prescribed therapeutic regimen will improve Outcome: Completed/Met Goal: Individualized Educational Video(s) Outcome: Completed/Met   Problem: Activity: Goal: Ability to tolerate increased activity will improve Outcome: Completed/Met Goal: Will verbalize the importance of balancing activity with adequate rest periods Outcome: Completed/Met   Problem: Respiratory: Goal: Ability to maintain a clear airway will improve Outcome: Completed/Met Goal: Levels of oxygenation will improve Outcome: Completed/Met Goal: Ability to maintain adequate ventilation will improve Outcome: Completed/Met

## 2024-04-10 NOTE — Discharge Instructions (Signed)
 Follow with SNF provider  Get CBC, CMP,  by Primary MD next visit.    Activity: As tolerated with Full fall precautions use walker/cane & assistance as needed   Disposition SNF   Diet: Heart Healthy   On your next visit with your primary care physician please Get Medicines reviewed and adjusted.   Please request your Prim.MD to go over all Hospital Tests and Procedure/Radiological results at the follow up, please get all Hospital records sent to your Prim MD by signing hospital release before you go home.   If you experience worsening of your admission symptoms, develop shortness of breath, life threatening emergency, suicidal or homicidal thoughts you must seek medical attention immediately by calling 911 or calling your MD immediately  if symptoms less severe.  You Must read complete instructions/literature along with all the possible adverse reactions/side effects for all the Medicines you take and that have been prescribed to you. Take any new Medicines after you have completely understood and accpet all the possible adverse reactions/side effects.   Do not drive, operating heavy machinery, perform activities at heights, swimming or participation in water  activities or provide baby sitting services if your were admitted for syncope or siezures until you have seen by Primary MD or a Neurologist and advised to do so again.  Do not drive when taking Pain medications.    Do not take more than prescribed Pain, Sleep and Anxiety Medications  Special Instructions: If you have smoked or chewed Tobacco  in the last 2 yrs please stop smoking, stop any regular Alcohol  and or any Recreational drug use.  Wear Seat belts while driving.   Please note  You were cared for by a hospitalist during your hospital stay. If you have any questions about your discharge medications or the care you received while you were in the hospital after you are discharged, you can call the unit and asked to  speak with the hospitalist on call if the hospitalist that took care of you is not available. Once you are discharged, your primary care physician will handle any further medical issues. Please note that NO REFILLS for any discharge medications will be authorized once you are discharged, as it is imperative that you return to your primary care physician (or establish a relationship with a primary care physician if you do not have one) for your aftercare needs so that they can reassess your need for medications and monitor your lab values.

## 2024-04-19 ENCOUNTER — Telehealth: Payer: Self-pay

## 2024-04-19 NOTE — Telephone Encounter (Signed)
 Insurance states that [facility] is their PCP. I called to make an appt but was unable to [facility] due to VM not being set up.

## 2024-05-03 ENCOUNTER — Encounter: Payer: Self-pay | Admitting: Adult Health

## 2024-05-03 ENCOUNTER — Inpatient Hospital Stay: Admitting: Orthopedic Surgery

## 2024-05-03 ENCOUNTER — Ambulatory Visit (INDEPENDENT_AMBULATORY_CARE_PROVIDER_SITE_OTHER): Admitting: Adult Health

## 2024-05-03 VITALS — BP 126/64 | HR 64 | Temp 97.5°F | Ht 73.0 in | Wt 195.2 lb

## 2024-05-03 DIAGNOSIS — J441 Chronic obstructive pulmonary disease with (acute) exacerbation: Secondary | ICD-10-CM | POA: Diagnosis not present

## 2024-05-03 DIAGNOSIS — I50811 Acute right heart failure: Secondary | ICD-10-CM

## 2024-05-03 DIAGNOSIS — U071 COVID-19: Secondary | ICD-10-CM | POA: Diagnosis not present

## 2024-05-03 DIAGNOSIS — I4729 Other ventricular tachycardia: Secondary | ICD-10-CM

## 2024-05-03 DIAGNOSIS — I951 Orthostatic hypotension: Secondary | ICD-10-CM

## 2024-05-03 DIAGNOSIS — R7303 Prediabetes: Secondary | ICD-10-CM

## 2024-05-03 DIAGNOSIS — Z7689 Persons encountering health services in other specified circumstances: Secondary | ICD-10-CM

## 2024-05-03 DIAGNOSIS — Z122 Encounter for screening for malignant neoplasm of respiratory organs: Secondary | ICD-10-CM

## 2024-05-03 DIAGNOSIS — J9611 Chronic respiratory failure with hypoxia: Secondary | ICD-10-CM

## 2024-05-03 DIAGNOSIS — K219 Gastro-esophageal reflux disease without esophagitis: Secondary | ICD-10-CM

## 2024-05-03 DIAGNOSIS — G4733 Obstructive sleep apnea (adult) (pediatric): Secondary | ICD-10-CM

## 2024-05-03 MED ORDER — METOPROLOL SUCCINATE ER 25 MG PO TB24: 12.5000 mg | ORAL_TABLET | Freq: Every day | ORAL | 1 refills | Status: DC

## 2024-05-03 MED ORDER — METOPROLOL SUCCINATE ER 25 MG PO TB24
12.5000 mg | ORAL_TABLET | Freq: Every day | ORAL | 1 refills | Status: AC
Start: 2024-05-03 — End: ?

## 2024-05-03 NOTE — Progress Notes (Signed)
 [facility] clinic  Provider:  Jereld Serum DNP  Code Status:   Full Code  Goals of Care:     04/13/2021    8:40 PM  Advanced Directives  Does Patient Have a Medical Advance Directive? No  Would patient like information on creating a medical advance directive? No - Patient declined     Chief Complaint  Patient presents with   Establish Care   Hospitalization Follow-up    10/9 - 10/21. He then went to [facility] and was discharged from there on 11/06    Discussed the use of AI scribe software for clinical note transcription with the patient, who gave verbal consent to proceed.  HPI: Patient is a 73 y.o. male seen today to establish care with [facility]. He is accompanied by his son.  He was hospitalized from October 9th to October 21st due to worsening symptoms including chills, rigor, and diminished breath sounds. He was diagnosed with COVID-19 and required increased oxygen  support, initially on two liters, increased to five liters, and was placed on BiPAP.  He was given a dose of remdesivir, Decadron, ceftriaxone.  Repeat chest x-ray on 04/01/2024 showed emphysema and no infiltrates.  He is currently on four liters of oxygen  at home and has not been able to reduce it to his baseline of two to three liters without experiencing shortness of breath.  He has a history of COPD and reports that an X-ray was done which showed emphysema and no infiltrates. He uses Flonase two sprays daily, Mucinex  600 mg twice daily, and Pulmicort  nebulization once daily, although he has an order for twice daily. He also uses Stiolto, inhaling two puffs in the morning, and has albuterol  PRN. He has a cough with sputum that is sometimes clear and sometimes dark.  He has a history of congestive heart failure, both acute and chronic, with a last echocardiogram showing an ejection fraction of 60-65%. He was treated with IV diuretics during his hospitalization. His appetite, which was previously poor, is improving. He  was on Jardiance, which was discontinued, and is currently on Toprol  XL 25 mg daily for non-sustained ventricular tachycardia.  He has a history of acute pulmonary embolism in 2022 but is no longer on anticoagulants. He has possible sleep apnea but is not currently using CPAP. No depression.  He is prediabetic with a last A1c of 5.6, managed with diet control. He is a widow with one child, with whom he lives. He has a history of smoking, having quit about five years ago after a heart attack, with a 20-pack-year history.  He is under hospice care with [facility]. He and his family report that his oxygen  levels drop significantly with exertion. He is not on a DNR and is full code.      Past Medical History:  Diagnosis Date   CHF (congestive heart failure) (HCC)    COPD (chronic obstructive pulmonary disease) (HCC)    Diabetes mellitus without complication (HCC)    Dyspnea    Hypertension     Past Surgical History:  Procedure Laterality Date   NO PAST SURGERIES      No Known Allergies  Outpatient Encounter Medications as of 05/03/2024  Medication Sig   acetaminophen  (TYLENOL ) 325 MG tablet Take 2 tablets (650 mg total) by mouth every 6 (six) hours as needed for mild pain (pain score 1-3) or fever (or Fever >/= 101).   acetaminophen  (TYLENOL ) 500 MG tablet Take 1,000 mg by mouth every morning.   albuterol  (VENTOLIN   HFA) 108 (90 Base) MCG/ACT inhaler Inhale 2 puffs into the lungs every 4 (four) hours as needed for wheezing or shortness of breath.   budesonide  (PULMICORT ) 0.25 MG/2ML nebulizer solution Take 2 mLs (0.25 mg total) by nebulization 2 (two) times daily.   empagliflozin (JARDIANCE) 25 MG TABS tablet Take 25 mg by mouth daily. Take 1/2 tablet by mouth   ferrous sulfate  (FEROSUL) 325 (65 FE) MG tablet Take 325 mg by mouth daily with breakfast.   furosemide  (LASIX ) 20 MG tablet Take 2 tablets (40 mg total) by mouth daily as needed for fluid or edema.   ipratropium-albuterol  (DUONEB)  0.5-2.5 (3) MG/3ML SOLN Take 3 mLs by nebulization every 4 (four) hours as needed.   lactulose , encephalopathy, (CHRONULAC ) 10 GM/15ML SOLN Take 30 g by mouth 2 (two) times daily.   tamsulosin  (FLOMAX ) 0.4 MG CAPS capsule Take 0.4 mg by mouth daily.   [DISCONTINUED] metoprolol  succinate (TOPROL -XL) 25 MG 24 hr tablet Take 0.5 tablets (12.5 mg total) by mouth daily.   feeding supplement (ENSURE PLUS HIGH PROTEIN) LIQD Take 237 mLs by mouth 3 (three) times daily between meals. (Patient not taking: Reported on 05/03/2024)   metoprolol  succinate (TOPROL -XL) 25 MG 24 hr tablet Take 0.5 tablets (12.5 mg total) by mouth daily.   midodrine (PROAMATINE) 5 MG tablet Take 1 tablet (5 mg total) by mouth 3 (three) times daily with meals. (Patient not taking: Reported on 05/03/2024)   pantoprazole  (PROTONIX ) 40 MG tablet Take 1 tablet (40 mg total) by mouth daily at 12 noon. (Patient not taking: Reported on 05/03/2024)   Tiotropium Bromide -Olodaterol (STIOLTO RESPIMAT ) 2.5-2.5 MCG/ACT AERS Inhale 2 puffs into the lungs daily. (Patient not taking: Reported on 05/03/2024)   [DISCONTINUED] Fluticasone -Salmeterol (ADVAIR DISKUS) 250-50 MCG/DOSE AEPB Inhale 1 puff into the lungs 2 (two) times daily.   [DISCONTINUED] metoprolol  succinate (TOPROL -XL) 25 MG 24 hr tablet Take 0.5 tablets (12.5 mg total) by mouth daily.   No facility-administered encounter medications on file as of 05/03/2024.    Review of Systems:  Review of Systems  Constitutional:  Negative for activity change, appetite change and fever.  HENT:  Negative for sore throat.   Eyes: Negative.   Respiratory:  Positive for cough and shortness of breath.   Cardiovascular:  Negative for chest pain and leg swelling.  Gastrointestinal:  Negative for abdominal distention, diarrhea and vomiting.  Genitourinary:  Negative for dysuria, frequency and urgency.  Skin:  Negative for color change.  Neurological:  Negative for dizziness and headaches.   Psychiatric/Behavioral:  Negative for behavioral problems and sleep disturbance. The patient is not nervous/anxious.     Health Maintenance  Topic Date Due   Medicare Annual Wellness (AWV)  Never done   OPHTHALMOLOGY EXAM  Never done   DTaP/Tdap/Td (1 - Tdap) Never done   Colonoscopy  Never done   Zoster Vaccines- Shingrix (2 of 2) 02/10/2021   FOOT EXAM  10/16/2021   Diabetic kidney evaluation - Urine ACR  01/13/2022   Lung Cancer Screening  04/13/2022   Influenza Vaccine  01/20/2024   COVID-19 Vaccine (4 - 2025-26 season) 02/20/2024   HEMOGLOBIN A1C  09/27/2024   Diabetic kidney evaluation - eGFR measurement  05/03/2025   Pneumococcal Vaccine: 50+ Years  Completed   Hepatitis C Screening  Completed   Meningococcal B Vaccine  Aged Out   Hepatitis B Vaccines 19-59 Average Risk  Discontinued    Physical Exam: Vitals:   05/03/24 1053  BP: 126/64  Pulse: 64  Temp: (!) 97.5 F (36.4 C)  TempSrc: Temporal  SpO2: 98%  Weight: 195 lb 3.2 oz (88.5 kg)  Height: 6' 1 (1.854 m)   Body mass index is 25.75 kg/m. Physical Exam Constitutional:      Appearance: Normal appearance.  HENT:     Head: Normocephalic and atraumatic.     Mouth/Throat:     Mouth: Mucous membranes are moist.  Eyes:     Conjunctiva/sclera: Conjunctivae normal.  Cardiovascular:     Rate and Rhythm: Normal rate and regular rhythm.     Pulses: Normal pulses.     Heart sounds: Normal heart sounds.  Pulmonary:     Effort: Pulmonary effort is normal.     Breath sounds: Normal breath sounds.     Comments: O2 @ 4L/min via [place] Abdominal:     General: Bowel sounds are normal.     Palpations: Abdomen is soft.  Musculoskeletal:        General: No swelling. Normal range of motion.     Cervical back: Normal range of motion.  Skin:    General: Skin is warm and dry.  Neurological:     General: No focal deficit present.     Mental Status: He is alert and oriented to person, place, and time.  Psychiatric:         Mood and Affect: Mood normal.        Behavior: Behavior normal.        Thought Content: Thought content normal.        Judgment: Judgment normal.     Labs reviewed: Basic Metabolic Panel: Recent Labs    04/06/24 0837 04/07/24 0722 04/08/24 0445 04/09/24 0425 04/10/24 0344 05/03/24 1155  NA 137 138 137 138 138 140  K 4.1 3.8 4.0 3.4* 3.7 3.9  CL 92* 88* 89* 91* 95* 94*  CO2 33* 36* 35* 34* 35* 37*  GLUCOSE 132* 141* 121* 121* 116* 113  BUN 47* 51* 60* 65* 50* 15  CREATININE 1.25* 1.38* 1.33* 1.66* 1.24 0.98  CALCIUM 9.6 10.0 9.5 9.3 9.4 10.0  MG 2.3 2.4 2.4  --   --   --   PHOS 2.9 4.9* 5.7* 4.7* 2.5  --    Liver Function Tests: Recent Labs    03/30/24 0500 03/31/24 1125 05/03/24 1155  AST 28 34 26  ALT 11 11 22   ALKPHOS 45 44  --   BILITOT 2.1* 2.0* 1.4*  PROT 7.5 7.4 7.7  ALBUMIN 3.5 3.7  --    No results for input(s): LIPASE, AMYLASE in the last 8760 hours. No results for input(s): AMMONIA in the last 8760 hours. CBC: Recent Labs    04/05/24 0401 04/06/24 0837 04/07/24 0722 04/08/24 0445 04/09/24 0425 04/10/24 0344  WBC 4.6 5.2 4.0 4.8 6.1 7.6  NEUTROABS 3.5 3.5 2.7  --   --   --   HGB 15.7 16.7 17.9* 16.6 16.4 15.9  HCT 50.7 54.1* 56.5* 52.1* 52.9* 52.0  MCV 97.1 96.6 96.3 96.1 95.8 97.0  PLT 148* 162 165 191 149* 151   Lipid Panel: No results for input(s): CHOL, HDL, LDLCALC, TRIG, CHOLHDL, LDLDIRECT in the last 8760 hours. Lab Results  Component Value Date   HGBA1C 5.6 03/29/2024    Procedures since last visit: No results found.   Assessment/Plan  1. COPD with acute exacerbation (HCC) (Primary) -  Recent COPD exacerbation with COVID-19. On home oxygen  therapy. Persistent cough. - Titrate oxygen  to 3.5 liters and monitor oxygen  saturation. -  Continue Mucinex  600 mg twice daily. - Continue Pulmicort  nebulization twice a day. - Continue Stiolto inhaler, 2 puffs in the morning. - Schedule follow-up with  pulmonologist. - Home sleep test - Comprehensive metabolic panel - Ambulatory referral to Pulmonology  2. COVID-19 virus infection -  was given a dose of remdesivir, Decadron, ceftriaxone.  Repeat chest x-ray on 04/01/2024 showed emphysema and no infiltrates.  3. Chronic respiratory failure with hypoxia (HCC) -  continue O2 @ 3-4 L/min via [place]  4. Acute right-sided congestive heart failure (HCC) -   treated with IV fluids and diuretics. Ejection fraction 60-65%. Jardiance discontinued due to kidney concerns. - Check kidney function for potential Jardiance restart. - Schedule cardiology consult for heart failure management.  5. NSVT (nonsustained ventricular tachycardia) (HCC) -  controlled with Toprol  XL. Heart rate stable at 64 bpm. - Ambulatory referral to Cardiology - metoprolol  succinate (TOPROL -XL) 25 MG 24 hr tablet; Take 0.5 tablets (12.5 mg total) by mouth daily.  Dispense: 45 tablet; Refill: 1  6. Orthostatic hypotension -  Blood pressure well-controlled at 126/64 mmHg. -  continue Midodrine 5 mg 3X/day  7. Prediabetes -  A1c at 5.6%. Appetite improving, suggesting potential for dietary control. - Continue dietary control for prediabetes management.  8. OSA (obstructive sleep apnea) -  Suspected sleep apnea. Referral for sleep study discussed. - Polysomnography 4 or more parameters; Future  9. Screening for lung cancer - CT CHEST LUNG CA SCREEN LOW DOSE W/O CM; Future  10. Encounter to establish care -  established care with [facility]     General Health Maintenance Received flu vaccine. Significant past smoking history. - Ordered low-dose CT scan of the chest for lung cancer screening.      Labs/tests ordered:   CMP   Return in about 4 weeks (around 05/31/2024).  Mitchell Clugston Medina-Vargas, NP

## 2024-05-04 LAB — COMPREHENSIVE METABOLIC PANEL WITH GFR
AG Ratio: 1 (calc) (ref 1.0–2.5)
ALT: 22 U/L (ref 9–46)
AST: 26 U/L (ref 10–35)
Albumin: 3.9 g/dL (ref 3.6–5.1)
Alkaline phosphatase (APISO): 70 U/L (ref 35–144)
BUN: 15 mg/dL (ref 7–25)
CO2: 37 mmol/L — ABNORMAL HIGH (ref 20–32)
Calcium: 10 mg/dL (ref 8.6–10.3)
Chloride: 94 mmol/L — ABNORMAL LOW (ref 98–110)
Creat: 0.98 mg/dL (ref 0.70–1.28)
Globulin: 3.8 g/dL — ABNORMAL HIGH (ref 1.9–3.7)
Glucose, Bld: 113 mg/dL (ref 65–139)
Potassium: 3.9 mmol/L (ref 3.5–5.3)
Sodium: 140 mmol/L (ref 135–146)
Total Bilirubin: 1.4 mg/dL — ABNORMAL HIGH (ref 0.2–1.2)
Total Protein: 7.7 g/dL (ref 6.1–8.1)
eGFR: 81 mL/min/1.73m2 (ref >=60)

## 2024-05-09 ENCOUNTER — Ambulatory Visit: Payer: Self-pay | Admitting: Adult Health

## 2024-05-09 DIAGNOSIS — Z9911 Dependence on respirator [ventilator] status: Secondary | ICD-10-CM | POA: Insufficient documentation

## 2024-05-09 NOTE — Progress Notes (Signed)
-    CO2 elevated due to COPD -  total bilirubin slightly elevate, will re-check level on follow up, has history of chronic liver disease

## 2024-05-18 ENCOUNTER — Other Ambulatory Visit (HOSPITAL_COMMUNITY): Payer: Self-pay

## 2024-06-01 ENCOUNTER — Ambulatory Visit: Admitting: Adult Health

## 2024-07-17 ENCOUNTER — Ambulatory Visit: Admitting: Pulmonary Disease

## 2024-07-17 NOTE — Progress Notes (Unsigned)
 "  Established Patient Pulmonology Office Visit   Subjective:  Patient ID: Mitchell King, male    DOB: [birth year]  MRN: [id number]  CC: No chief complaint on file.   Discussed the use of AI scribe software for clinical note transcription with the patient, who gave verbal consent to proceed.  History of Present Illness       {PULM QUESTIONNAIRES (Optional):33196}  ROS  {History (Optional):23778} Current Medications[1]      Objective:  There were no vitals taken for this visit.  {Pulm Vitals (Optional):32837}  Physical Exam   Diagnostic Review:  Last CBC Lab Results  Component Value Date   WBC 7.6 04/10/2024   HGB 15.9 04/10/2024   HCT 52.0 04/10/2024   MCV 97.0 04/10/2024   MCH 29.7 04/10/2024   RDW 13.6 04/10/2024   PLT 151 04/10/2024   Last metabolic panel Lab Results  Component Value Date   GLUCOSE 113 05/03/2024   NA 140 05/03/2024   K 3.9 05/03/2024   CL 94 (L) 05/03/2024   CO2 37 (H) 05/03/2024   BUN 15 05/03/2024   CREATININE 0.98 05/03/2024   EGFR 81 05/03/2024   CALCIUM 10.0 05/03/2024   PHOS 2.5 04/10/2024   PROT 7.7 05/03/2024   ALBUMIN 3.7 03/31/2024   BILITOT 1.4 (H) 05/03/2024   ALKPHOS 44 03/31/2024   AST 26 05/03/2024   ALT 22 05/03/2024   ANIONGAP 8 04/10/2024       Assessment & Plan:   Assessment & Plan   Assessment and Plan Assessment & Plan       No follow-ups on file.   [name], MD    [1]  Current Outpatient Medications:    acetaminophen  (TYLENOL ) 325 MG tablet, Take 2 tablets (650 mg total) by mouth every 6 (six) hours as needed for mild pain (pain score 1-3) or fever (or Fever >/= 101)., Disp: , Rfl:    acetaminophen  (TYLENOL ) 500 MG tablet, Take 1,000 mg by mouth every morning., Disp: , Rfl:    albuterol  (VENTOLIN  HFA) 108 (90 Base) MCG/ACT inhaler, Inhale 2 puffs into the lungs every 4 (four) hours as needed for wheezing or shortness of breath., Disp: 6.7 g, Rfl: 0   budesonide  (PULMICORT )  0.25 MG/2ML nebulizer solution, Take 2 mLs (0.25 mg total) by nebulization 2 (two) times daily., Disp: , Rfl:    empagliflozin  (JARDIANCE ) 25 MG TABS tablet, Take 25 mg by mouth daily. Take 1/2 tablet by mouth, Disp: , Rfl:    feeding supplement (ENSURE PLUS HIGH PROTEIN) LIQD, Take 237 mLs by mouth 3 (three) times daily between meals. (Patient not taking: Reported on 05/03/2024), Disp: , Rfl:    ferrous sulfate  (FEROSUL) 325 (65 FE) MG tablet, Take 325 mg by mouth daily with breakfast., Disp: , Rfl:    furosemide  (LASIX ) 20 MG tablet, Take 2 tablets (40 mg total) by mouth daily as needed for fluid or edema., Disp: , Rfl:    ipratropium-albuterol  (DUONEB) 0.5-2.5 (3) MG/3ML SOLN, Take 3 mLs by nebulization every 4 (four) hours as needed., Disp: 360 mL, Rfl: 1   lactulose , encephalopathy, (CHRONULAC ) 10 GM/15ML SOLN, Take 30 g by mouth 2 (two) times daily., Disp: , Rfl:    metoprolol  succinate (TOPROL -XL) 25 MG 24 hr tablet, Take 0.5 tablets (12.5 mg total) by mouth daily., Disp: 45 tablet, Rfl: 1   midodrine  (PROAMATINE ) 5 MG tablet, Take 1 tablet (5 mg total) by mouth 3 (three) times daily with meals. (Patient not taking: Reported on  05/03/2024), Disp: , Rfl:    pantoprazole  (PROTONIX ) 40 MG tablet, Take 1 tablet (40 mg total) by mouth daily at 12 noon. (Patient not taking: Reported on 05/03/2024), Disp: , Rfl:    tamsulosin  (FLOMAX ) 0.4 MG CAPS capsule, Take 0.4 mg by mouth daily., Disp: , Rfl:    Tiotropium Bromide -Olodaterol (STIOLTO RESPIMAT ) 2.5-2.5 MCG/ACT AERS, Inhale 2 puffs into the lungs daily. (Patient not taking: Reported on 05/03/2024), Disp: 4 g, Rfl: 0  "
